# Patient Record
Sex: Female | Born: 1960 | Race: White | Hispanic: No | Smoking: Never smoker
Health system: Southern US, Community
[De-identification: ages and names within clinical notes are randomized; demographics above are authoritative.]

## PROBLEM LIST (undated history)

## (undated) DIAGNOSIS — L568 Other specified acute skin changes due to ultraviolet radiation: Secondary | ICD-10-CM

## (undated) DIAGNOSIS — I10 Essential (primary) hypertension: Secondary | ICD-10-CM

## (undated) DIAGNOSIS — K8689 Other specified diseases of pancreas: Secondary | ICD-10-CM

## (undated) DIAGNOSIS — I81 Portal vein thrombosis: Secondary | ICD-10-CM

## (undated) DIAGNOSIS — F1020 Alcohol dependence, uncomplicated: Secondary | ICD-10-CM

## (undated) DIAGNOSIS — K819 Cholecystitis, unspecified: Secondary | ICD-10-CM

## (undated) DIAGNOSIS — R519 Headache, unspecified: Secondary | ICD-10-CM

## (undated) DIAGNOSIS — K859 Acute pancreatitis without necrosis or infection, unspecified: Secondary | ICD-10-CM

## (undated) DIAGNOSIS — K219 Gastro-esophageal reflux disease without esophagitis: Secondary | ICD-10-CM

## (undated) HISTORY — DX: Headache, unspecified: R51.9

## (undated) HISTORY — PX: TONSILLECTOMY: SUR1361

## (undated) HISTORY — DX: Other specified diseases of pancreas: K86.89

## (undated) HISTORY — DX: Portal vein thrombosis: I81

## (undated) HISTORY — DX: Alcohol dependence, uncomplicated: F10.20

## (undated) HISTORY — PX: TUBAL LIGATION: SHX77

## (undated) HISTORY — DX: Acute pancreatitis without necrosis or infection, unspecified: K85.90

## (undated) HISTORY — PX: DOPPLER ECHOCARDIOGRAPHY: SHX263

## (undated) HISTORY — DX: Gastro-esophageal reflux disease without esophagitis: K21.9

## (undated) HISTORY — PX: OTHER SURGICAL HISTORY: SHX169

## (undated) HISTORY — PX: APPENDECTOMY: SHX54

## (undated) HISTORY — DX: Essential (primary) hypertension: I10

## (undated) HISTORY — DX: Other specified acute skin changes due to ultraviolet radiation: L56.8

---

## 1998-03-27 ENCOUNTER — Ambulatory Visit (HOSPITAL_COMMUNITY): Admission: RE | Admit: 1998-03-27 | Discharge: 1998-03-27 | Payer: Self-pay | Admitting: Family Medicine

## 1998-03-27 ENCOUNTER — Encounter: Payer: Self-pay | Admitting: Family Medicine

## 1999-02-05 HISTORY — PX: OTHER SURGICAL HISTORY: SHX169

## 1999-02-05 HISTORY — PX: DOBUTAMINE STRESS ECHO: SHX5426

## 1999-02-21 ENCOUNTER — Encounter: Payer: Self-pay | Admitting: Pulmonary Disease

## 1999-02-22 ENCOUNTER — Inpatient Hospital Stay (HOSPITAL_COMMUNITY): Admission: EM | Admit: 1999-02-22 | Discharge: 1999-02-24 | Payer: Self-pay | Admitting: Emergency Medicine

## 1999-11-05 HISTORY — PX: OTHER SURGICAL HISTORY: SHX169

## 1999-11-05 HISTORY — PX: ESOPHAGOGASTRODUODENOSCOPY: SHX1529

## 1999-11-14 ENCOUNTER — Encounter (INDEPENDENT_AMBULATORY_CARE_PROVIDER_SITE_OTHER): Payer: Self-pay | Admitting: Specialist

## 1999-11-14 ENCOUNTER — Other Ambulatory Visit: Admission: RE | Admit: 1999-11-14 | Discharge: 1999-11-14 | Payer: Self-pay | Admitting: Gastroenterology

## 1999-11-21 ENCOUNTER — Ambulatory Visit (HOSPITAL_COMMUNITY): Admission: RE | Admit: 1999-11-21 | Discharge: 1999-11-21 | Payer: Self-pay | Admitting: Gastroenterology

## 1999-11-21 ENCOUNTER — Encounter: Payer: Self-pay | Admitting: Gastroenterology

## 1999-12-12 ENCOUNTER — Other Ambulatory Visit: Admission: RE | Admit: 1999-12-12 | Discharge: 1999-12-12 | Payer: Self-pay | Admitting: Family Medicine

## 2000-05-28 ENCOUNTER — Other Ambulatory Visit: Admission: RE | Admit: 2000-05-28 | Discharge: 2000-05-28 | Payer: Self-pay | Admitting: Family Medicine

## 2002-02-17 ENCOUNTER — Other Ambulatory Visit: Admission: RE | Admit: 2002-02-17 | Discharge: 2002-02-17 | Payer: Self-pay | Admitting: Family Medicine

## 2003-03-16 ENCOUNTER — Other Ambulatory Visit: Admission: RE | Admit: 2003-03-16 | Discharge: 2003-03-16 | Payer: Self-pay | Admitting: Family Medicine

## 2003-04-07 HISTORY — PX: COLONOSCOPY: SHX174

## 2003-12-05 HISTORY — PX: OTHER SURGICAL HISTORY: SHX169

## 2004-10-09 ENCOUNTER — Ambulatory Visit: Payer: Self-pay | Admitting: Family Medicine

## 2005-03-27 ENCOUNTER — Other Ambulatory Visit: Admission: RE | Admit: 2005-03-27 | Discharge: 2005-03-27 | Payer: Self-pay | Admitting: Family Medicine

## 2005-03-27 ENCOUNTER — Ambulatory Visit: Payer: Self-pay | Admitting: Family Medicine

## 2005-03-27 ENCOUNTER — Encounter: Payer: Self-pay | Admitting: Family Medicine

## 2005-03-27 LAB — CONVERTED CEMR LAB: Pap Smear: NORMAL

## 2005-04-13 ENCOUNTER — Ambulatory Visit: Payer: Self-pay | Admitting: Family Medicine

## 2005-06-12 ENCOUNTER — Ambulatory Visit: Payer: Self-pay | Admitting: Family Medicine

## 2005-07-31 ENCOUNTER — Ambulatory Visit: Payer: Self-pay | Admitting: Family Medicine

## 2005-08-05 ENCOUNTER — Encounter: Payer: Self-pay | Admitting: Internal Medicine

## 2005-09-25 ENCOUNTER — Ambulatory Visit: Payer: Self-pay | Admitting: Family Medicine

## 2006-06-02 ENCOUNTER — Ambulatory Visit: Payer: Self-pay | Admitting: Family Medicine

## 2006-07-02 ENCOUNTER — Ambulatory Visit: Payer: Self-pay | Admitting: Family Medicine

## 2006-07-02 LAB — CONVERTED CEMR LAB
ALT: 14 U/L (ref 0–35)
AST: 20 U/L (ref 0–37)
Albumin: 3.9 g/dL (ref 3.5–5.2)
Alkaline Phosphatase: 47 U/L (ref 39–117)
BUN: 11 mg/dL (ref 6–23)
Bilirubin, Direct: 0.2 mg/dL (ref 0.0–0.3)
CO2: 28 meq/L (ref 19–32)
Calcium: 9.4 mg/dL (ref 8.4–10.5)
Chloride: 102 meq/L (ref 96–112)
Cholesterol: 142 mg/dL (ref 0–200)
Creatinine, Ser: 0.63 mg/dL (ref 0.40–1.20)
Glucose, Bld: 85 mg/dL (ref 70–99)
HDL: 59 mg/dL (ref >39)
Indirect Bilirubin: 0.4 mg/dL (ref 0.0–0.9)
LDL Cholesterol: 65 mg/dL (ref 0–99)
Potassium: 4 meq/L (ref 3.5–5.3)
Sodium: 141 meq/L (ref 135–145)
TSH: 0.863 u[IU]/mL (ref 0.350–5.50)
Total Bilirubin: 0.6 mg/dL (ref 0.3–1.2)
Total CHOL/HDL Ratio: 2.4
Total Protein: 6.9 g/dL (ref 6.0–8.3)
Triglycerides: 92 mg/dL (ref <150)
VLDL: 18 mg/dL (ref 0–40)

## 2006-07-15 ENCOUNTER — Ambulatory Visit: Payer: Self-pay | Admitting: Family Medicine

## 2006-07-15 LAB — HM MAMMOGRAPHY: HM Mammogram: NORMAL

## 2006-08-16 ENCOUNTER — Encounter: Payer: Self-pay | Admitting: Family Medicine

## 2006-08-16 DIAGNOSIS — I10 Essential (primary) hypertension: Secondary | ICD-10-CM | POA: Insufficient documentation

## 2006-08-16 DIAGNOSIS — K219 Gastro-esophageal reflux disease without esophagitis: Secondary | ICD-10-CM | POA: Insufficient documentation

## 2006-08-16 DIAGNOSIS — J45909 Unspecified asthma, uncomplicated: Secondary | ICD-10-CM | POA: Insufficient documentation

## 2006-08-16 DIAGNOSIS — K209 Esophagitis, unspecified without bleeding: Secondary | ICD-10-CM | POA: Insufficient documentation

## 2006-08-16 DIAGNOSIS — Z9189 Other specified personal risk factors, not elsewhere classified: Secondary | ICD-10-CM | POA: Insufficient documentation

## 2006-08-20 ENCOUNTER — Ambulatory Visit: Payer: Self-pay | Admitting: Family Medicine

## 2006-08-20 ENCOUNTER — Encounter: Payer: Self-pay | Admitting: Family Medicine

## 2006-08-20 ENCOUNTER — Other Ambulatory Visit: Admission: RE | Admit: 2006-08-20 | Discharge: 2006-08-20 | Payer: Self-pay | Admitting: Family Medicine

## 2007-05-30 ENCOUNTER — Telehealth: Payer: Self-pay | Admitting: Family Medicine

## 2007-05-31 ENCOUNTER — Ambulatory Visit: Payer: Self-pay | Admitting: Family Medicine

## 2007-06-03 ENCOUNTER — Telehealth: Payer: Self-pay | Admitting: Family Medicine

## 2008-05-04 ENCOUNTER — Telehealth (INDEPENDENT_AMBULATORY_CARE_PROVIDER_SITE_OTHER): Payer: Self-pay | Admitting: Internal Medicine

## 2009-11-11 ENCOUNTER — Encounter (INDEPENDENT_AMBULATORY_CARE_PROVIDER_SITE_OTHER): Payer: Self-pay | Admitting: *Deleted

## 2010-01-31 ENCOUNTER — Ambulatory Visit: Payer: Self-pay | Admitting: Family Medicine

## 2010-01-31 DIAGNOSIS — M2550 Pain in unspecified joint: Secondary | ICD-10-CM | POA: Insufficient documentation

## 2010-01-31 DIAGNOSIS — E538 Deficiency of other specified B group vitamins: Secondary | ICD-10-CM | POA: Insufficient documentation

## 2010-02-07 ENCOUNTER — Ambulatory Visit: Payer: Self-pay | Admitting: Family Medicine

## 2010-02-11 LAB — CONVERTED CEMR LAB
ALT: 21 units/L (ref 0–35)
AST: 28 units/L (ref 0–37)
Albumin: 3.9 g/dL (ref 3.5–5.2)
Alkaline Phosphatase: 45 units/L (ref 39–117)
BUN: 5 mg/dL — ABNORMAL LOW (ref 6–23)
Bilirubin, Direct: 0.1 mg/dL (ref 0.0–0.3)
CO2: 31 meq/L (ref 19–32)
Calcium: 9.4 mg/dL (ref 8.4–10.5)
Chloride: 98 meq/L (ref 96–112)
Cholesterol: 160 mg/dL (ref 0–200)
Creatinine, Ser: 0.6 mg/dL (ref 0.4–1.2)
GFR calc non Af Amer: 107.68 mL/min (ref >60)
Glucose, Bld: 78 mg/dL (ref 70–99)
HDL: 67.9 mg/dL (ref >39.00)
LDL Cholesterol: 65 mg/dL (ref 0–99)
Potassium: 4.1 meq/L (ref 3.5–5.1)
Sodium: 138 meq/L (ref 135–145)
Total Bilirubin: 0.6 mg/dL (ref 0.3–1.2)
Total CHOL/HDL Ratio: 2
Total Protein: 6.8 g/dL (ref 6.0–8.3)
Triglycerides: 137 mg/dL (ref 0.0–149.0)
VLDL: 27.4 mg/dL (ref 0.0–40.0)
Vitamin B-12: 239 pg/mL (ref 211–911)

## 2010-05-06 NOTE — Assessment & Plan Note (Signed)
Summary: BP IS HIGH/DLO   Vital Signs:  Patient profile:   50 year old female Height:      61 inches Weight:      168.75 pounds BMI:     32.00 Temp:     98.7 degrees F oral Pulse rate:   84 / minute Pulse rhythm:   regular BP sitting:   142 / 98  (left arm) Cuff size:   large  Vitals Entered By: Delilah Shan CMA Duncan Dull) (January 31, 2010 8:54 AM) CC: BP is high.  Patient states she is no longer taking any prescriptioned meds because she ran out of refills but she needs to go back on them.   History of Present Illness: Hypertension:      Using medication without problems or lightheadedness: off meds Chest pain with exertion: no Edema:no Short of breath:no Average home BPs: usually  ~145/90s Other issues: below  ?h/o lupus with prev eval.  H/o arthralgias.  Uses ibuprofen.  GI precautions d/w patient.  H/o B12 def after gastric bypass.  Due for labs.   Allergies: 1)  * Prinivil (Lisinopril) 2)  * Relafen (Nabumetone) 3)  Remeron (Mirtazapine)  Past History:  Family History: Last updated: 01/31/2010 Father: Alive with HTN and hypothyroidism, MI at age 90, h/o renal CA Mother:  Alive with CAD, angioplasty twice with stents, LLE BKA due stent, h/o breast CA Siblings  Two brothers and a sister, the sister has diabetes, renal CA  Social History: Last updated: 01/31/2010 Marital Status: Divorced Children: 2, grown Occupation: Armed forces operational officer, Field seismologist  Past Medical History: GERD Hypertension H/o photosensitivity (no formal dx of lupus), prev eval Dr. 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  Otherwise negative.    Physical Exam  General:  GEN: nad, alert and oriented HEENT: mucous membranes moist NECK: supple w/o LA CV: rrr.  no murmur PULM: ctab, no inc wob ABD: soft, +bs EXT: no edema SKIN: no acute rash    Impression & Recommendations:  Problem # 1:  HYPERTENSION (ICD-401.9) Restart meds and return for labs.  See pan.  Her updated medication list for this problem includes:    Hydrochlorothiazide 12.5 Mg Caps (Hydrochlorothiazide) .Marland Kitchen... Take 1 capsule by mouth once a day  Problem # 2:  B12 DEFICIENCY (ICD-266.2) Return for labs and can get injection after lab draw.  She agrees.   Problem # 3:  ARTHRALGIA (ICD-719.40) Continue NSAIDS but with GI precautions.  She has good relief of pain with this and no h/o adverse effect.  follow up as needed.   Complete Medication List: 1)  Motrin 800 Mg Tabs (Ibuprofen) .... Take 1 tablet by mouth three times a day take with food 2)  Potassium Chloride Crys Cr 20 Meq Tbcr (Potassium chloride crys cr) .... Take 1 tablet  by mouth once a day 3)  Hydrochlorothiazide 12.5 Mg Caps (Hydrochlorothiazide) .... Take 1 capsule by mouth once a day 4)  Cyanocobalamin 1000 Mcg/ml Soln (Cyanocobalamin) .... One injection every 6 months 5)  Promethazine Hcl 25 Mg Tabs (Promethazine hcl) .... One tab by mouth every 6 hrs as needed for nausea. 6)  Benadryl 25 Mg Tabs (Diphenhydramine hcl) .... One at night  Patient Instructions: 1)  Come back for fasting labs. 2)  bmet/lipid 401.1 3)  vitamin B12 266.2 4)  Get your B12 injection after your labs are drawn.   5)  Take care.  Prescriptions: PROMETHAZINE HCL 25 MG TABS (PROMETHAZINE  HCL) one tab by mouth every 6 hrs as needed for nausea.  #50 x 3   Entered and Authorized by:   Crawford Givens MD   Signed by:   Crawford Givens MD on 01/31/2010   Method used:   Electronically to        [facility] [address] (retail)       [address], [address]       [place], [place]  [address]       Ph: [phone]       Fax: [phone]   RxID:   [id number] HYDROCHLOROTHIAZIDE 12.5 MG CAPS (HYDROCHLOROTHIAZIDE) Take 1 capsule by mouth once a day  #90 x 3   Entered and Authorized by:   Crawford Givens MD   Signed by:   Crawford Givens MD on 01/31/2010   Method used:   Electronically to        [facility] [address] (retail)       [address], [address]       [place], [place]  [address]       Ph: [phone]       Fax: [phone]   RxID:   [id number] MOTRIN 800 MG TABS (IBUPROFEN) Take 1 tablet by mouth three times a day take with food  #90 x 5   Entered and Authorized by:   Crawford Givens MD   Signed by:   Crawford Givens MD on 01/31/2010   Method used:   Electronically to        [facility] [address] (retail)       [address], [address]       [place], [place]  [address]       Ph: [phone]       Fax: [phone]   RxID:   [id number] MOTRIN 800 MG TABS (IBUPROFEN) Take 1 tablet by mouth three times a day  #90 x 5   Entered and Authorized by:   Crawford Givens MD   Signed by:   Crawford Givens MD on 01/31/2010   Method used:   Electronically to        [facility] [address] (retail)       [address], [address]       [place], [place]  [address]       Ph: [phone]       Fax: [phone]   RxID:   [id number]    Orders Added: 1)  Est. Patient Level III [40814]    Current Allergies (reviewed today): * PRINIVIL (LISINOPRIL) * RELAFEN (NABUMETONE) REMERON (MIRTAZAPINE)

## 2010-05-06 NOTE — Letter (Signed)
Summary: Nadara Eaton letter  [place] at [facility]  [address] [place], [place] [address]   Phone: [phone]  Fax: [phone]       11/11/2009 MRN: [id number]  [facility] MCB[facility] [address] [place], [place]  [address]  Dear Ms. [facility] [place] [place] announce the retirement of Arta Silence, M.D., from full-time practice at the [facility] office effective October 03, 2009 and his plans of returning part-time.  It is important to Dr. Hetty Ely and to our practice that you understand that [facility] has seven physicians in our office for your health care needs.  We will continue to offer the same exceptional care that you have today.    Dr. Hetty Ely has spoken to many of you about his plans for retirement and returning part-time in the fall.   We will continue to work with you through the transition to schedule appointments for you in the office and meet the high standards that [facility] is committed to.   Again, it is with great pleasure that we share the news that Dr. Hetty Ely will return to [facility] in October of 2011 with a reduced schedule.    If you have any questions, or would like to request an appointment with one of our physicians, please call us at [phone] and press the option for Scheduling an appointment.  We take pleasure in providing you with excellent patient care and look forward to seeing you at your next office visit.  Our [facility] Physicians are:  Tillman Abide, M.D. Laurita Quint, M.D. Roxy Manns, M.D. Kerby Nora, M.D. Hannah Beat, M.D. Ruthe Mannan, M.D. We proudly welcomed Raechel Ache, M.D. and Eustaquio Boyden, M.D. to the practice in July/August 2011.  Sincerely,  [facility]

## 2010-05-06 NOTE — Assessment & Plan Note (Signed)
Summary: B12/DLO  Nurse Visit   Allergies: 1)  * Prinivil (Lisinopril) 2)  * Relafen (Nabumetone) 3)  Remeron (Mirtazapine)  Medication Administration  Injection # 1:    Medication: Vit B12 1000 mcg    Diagnosis: B12 DEFICIENCY (ICD-266.2)    Route: IM    Site: L deltoid    Exp Date: 08/04/2011    Lot #: [id number]    Mfr: American Regent    Patient tolerated injection without complications    Given by: Delilah Shan CMA (AAMA) (February 07, 2010 8:52 AM)  Orders Added: 1)  Admin of Therapeutic Inj  intramuscular or subcutaneous [96372] 2)  Vit B12 1000 mcg [J3420]   Medication Administration  Injection # 1:    Medication: Vit B12 1000 mcg    Diagnosis: B12 DEFICIENCY (ICD-266.2)    Route: IM    Site: L deltoid    Exp Date: 08/04/2011    Lot #: [id number]    Mfr: American Regent    Patient tolerated injection without complications    Given by: Delilah Shan CMA (AAMA) (February 07, 2010 8:52 AM)  Orders Added: 1)  Admin of Therapeutic Inj  intramuscular or subcutaneous [96372] 2)  Vit B12 1000 mcg [J3420]

## 2010-10-22 ENCOUNTER — Other Ambulatory Visit: Payer: Self-pay | Admitting: Family Medicine

## 2011-02-11 ENCOUNTER — Other Ambulatory Visit: Payer: Self-pay | Admitting: Family Medicine

## 2011-02-12 NOTE — Telephone Encounter (Signed)
Left v/m for pt to call back. Dr Para March said after CPX appt made can refill med until CPX appt.

## 2011-02-12 NOTE — Telephone Encounter (Signed)
Received refill request electronically from pharmacy. Is it okay to refill medication? 

## 2011-02-12 NOTE — Telephone Encounter (Signed)
No.  She needs physical with labs first.  Hasn't been seen in a year.

## 2011-02-13 NOTE — Telephone Encounter (Signed)
Give 1 month supply with 1 RF.  Thanks.

## 2011-02-13 NOTE — Telephone Encounter (Signed)
Pt now has appt scheduled for December for physical.  How many refills do you want to give on ibuprofen?

## 2011-02-16 MED ORDER — IBUPROFEN 800 MG PO TABS: 800.0000 mg | ORAL_TABLET | Freq: Three times a day (TID) | ORAL | Status: AC | PRN

## 2011-02-16 NOTE — Telephone Encounter (Signed)
Medication sent to pharmacy  

## 2011-02-17 ENCOUNTER — Other Ambulatory Visit: Payer: Self-pay | Admitting: Family Medicine

## 2011-02-17 NOTE — Telephone Encounter (Signed)
Received faxed refill request from pharmacy. Is it okay to refill medication? 

## 2011-03-04 ENCOUNTER — Other Ambulatory Visit: Payer: Self-pay | Admitting: *Deleted

## 2011-03-04 MED ORDER — HYDROCHLOROTHIAZIDE 12.5 MG PO CAPS: 12.5000 mg | ORAL_CAPSULE | Freq: Every day | ORAL | Status: DC

## 2011-03-20 ENCOUNTER — Encounter: Payer: Self-pay | Admitting: Family Medicine

## 2011-05-14 ENCOUNTER — Encounter: Payer: Self-pay | Admitting: Family Medicine

## 2011-05-15 ENCOUNTER — Encounter: Payer: Self-pay | Admitting: Family Medicine

## 2011-05-15 ENCOUNTER — Ambulatory Visit (INDEPENDENT_AMBULATORY_CARE_PROVIDER_SITE_OTHER): Payer: Self-pay | Admitting: Family Medicine

## 2011-05-15 DIAGNOSIS — M2550 Pain in unspecified joint: Secondary | ICD-10-CM

## 2011-05-15 DIAGNOSIS — Z1231 Encounter for screening mammogram for malignant neoplasm of breast: Secondary | ICD-10-CM

## 2011-05-15 DIAGNOSIS — I10 Essential (primary) hypertension: Secondary | ICD-10-CM

## 2011-05-15 DIAGNOSIS — E538 Deficiency of other specified B group vitamins: Secondary | ICD-10-CM

## 2011-05-15 DIAGNOSIS — R7401 Elevation of levels of liver transaminase levels: Secondary | ICD-10-CM

## 2011-05-15 LAB — COMPREHENSIVE METABOLIC PANEL
AST: 99 U/L — ABNORMAL HIGH (ref 0–37)
Albumin: 4 g/dL (ref 3.5–5.2)
BUN: 7 mg/dL (ref 6–23)
Calcium: 9 mg/dL (ref 8.4–10.5)
Chloride: 100 mEq/L (ref 96–112)
Creatinine, Ser: 0.5 mg/dL (ref 0.4–1.2)
GFR: 133.65 mL/min (ref >60.00)
Glucose, Bld: 71 mg/dL (ref 70–99)

## 2011-05-15 LAB — LIPID PANEL
LDL Cholesterol: 64 mg/dL (ref 0–99)
Triglycerides: 92 mg/dL (ref 0.0–149.0)

## 2011-05-15 LAB — VITAMIN B12: Vitamin B-12: 548 pg/mL (ref 211–911)

## 2011-05-15 MED ORDER — HYDROCHLOROTHIAZIDE 25 MG PO TABS: 25.0000 mg | ORAL_TABLET | Freq: Every day | ORAL | Status: DC

## 2011-05-15 MED ORDER — CELECOXIB 200 MG PO CAPS: 200.0000 mg | ORAL_CAPSULE | Freq: Every day | ORAL | Status: AC

## 2011-05-15 MED ORDER — POTASSIUM CHLORIDE CRYS ER 20 MEQ PO TBCR: 20.0000 meq | EXTENDED_RELEASE_TABLET | Freq: Every day | ORAL | Status: DC

## 2011-05-15 MED ORDER — PROMETHAZINE HCL 25 MG PO TABS: 25.0000 mg | ORAL_TABLET | Freq: Four times a day (QID) | ORAL | Status: DC | PRN

## 2011-05-15 MED ORDER — CYANOCOBALAMIN 1000 MCG/ML IJ SOLN
1000.0000 ug | Freq: Once | INTRAMUSCULAR | Status: AC
  Administered 2011-05-15: 1000 ug via INTRAMUSCULAR

## 2011-05-15 NOTE — Progress Notes (Signed)
H/o gastric bypass.  Subsequent B12 def.  Due for labs.  See notes on labs.   Diffuse joint pain, prev seen by rheum.  Possible lupus but no formal dx.  We talked about the prev gastric bypass and nsaids.  celebrex may be safer, but we talked about potential risk with it, too.  She is aware.  See plan.   Hypertension:    Using medication without problems or lightheadedness: yes Chest pain with exertion:no Edema:no Short of breath:no Average home BPs: 140/90.  Meds, vitals, and allergies reviewed.   PMH and SH reviewed  ROS: See HPI.  Otherwise negative.    GEN: nad, alert and oriented HEENT: mucous membranes moist NECK: supple w/o LA CV: rrr. PULM: ctab, no inc wob ABD: soft, +bs EXT: no edema SKIN: no acute rash

## 2011-05-15 NOTE — Patient Instructions (Addendum)
If needed, call and talk to Aram Beecham about the medication assistance program for celebrex.  See Shirlee Limerick about your referral before you leave today. Let me know if your BP stays above 140/90.

## 2011-05-15 NOTE — Progress Notes (Deleted)
  Subjective:    Patient ID: Abigail [name], female    DOB: [birth year], 51 y.o.   MRN: [id number]  HPI    Review of Systems     Objective:   Physical Exam        Assessment & Plan:

## 2011-05-17 ENCOUNTER — Encounter: Payer: Self-pay | Admitting: Family Medicine

## 2011-05-17 DIAGNOSIS — R7401 Elevation of levels of liver transaminase levels: Secondary | ICD-10-CM | POA: Insufficient documentation

## 2011-05-17 DIAGNOSIS — Z1231 Encounter for screening mammogram for malignant neoplasm of breast: Secondary | ICD-10-CM | POA: Insufficient documentation

## 2011-05-17 NOTE — Assessment & Plan Note (Signed)
Malabsorptive, check labs and inject today.

## 2011-05-17 NOTE — Assessment & Plan Note (Signed)
With possible lupus.  Will change to celebrex from ibuprofen and GI caution given.  She understood.

## 2011-05-17 NOTE — Assessment & Plan Note (Signed)
Refer for u/s.  Will start w/u with that.

## 2011-05-17 NOTE — Assessment & Plan Note (Signed)
Refer. Due.

## 2011-05-17 NOTE — Assessment & Plan Note (Signed)
Inc Hctz and she'll follow.  See notes on labs .

## 2011-05-26 ENCOUNTER — Other Ambulatory Visit: Payer: Self-pay

## 2011-06-02 ENCOUNTER — Encounter: Payer: Self-pay | Admitting: Family Medicine

## 2011-06-26 ENCOUNTER — Ambulatory Visit: Payer: Self-pay | Admitting: Family Medicine

## 2011-06-29 ENCOUNTER — Encounter: Payer: Self-pay | Admitting: Family Medicine

## 2011-06-29 ENCOUNTER — Encounter: Payer: Self-pay | Admitting: *Deleted

## 2012-03-09 ENCOUNTER — Telehealth: Payer: Self-pay | Admitting: Family Medicine

## 2012-03-09 ENCOUNTER — Inpatient Hospital Stay: Payer: Self-pay | Admitting: Internal Medicine

## 2012-03-09 LAB — COMPREHENSIVE METABOLIC PANEL
Albumin: 4.2 g/dL (ref 3.4–5.0)
Alkaline Phosphatase: 103 U/L (ref 50–136)
Bilirubin,Total: 0.9 mg/dL (ref 0.2–1.0)
Co2: 20 mmol/L — ABNORMAL LOW (ref 21–32)
Potassium: 3.3 mmol/L — ABNORMAL LOW (ref 3.5–5.1)
SGPT (ALT): 120 U/L — ABNORMAL HIGH (ref 12–78)
Sodium: 134 mmol/L — ABNORMAL LOW (ref 136–145)
Total Protein: 8.8 g/dL — ABNORMAL HIGH (ref 6.4–8.2)

## 2012-03-09 LAB — CBC
HCT: 47.7 % — ABNORMAL HIGH (ref 35.0–47.0)
HGB: 16.4 g/dL — ABNORMAL HIGH (ref 12.0–16.0)
MCH: 33.5 pg (ref 26.0–34.0)
MCHC: 34.4 g/dL (ref 32.0–36.0)
Platelet: 206 10*3/uL (ref 150–440)
RBC: 4.9 10*6/uL (ref 3.80–5.20)
RDW: 12.8 % (ref 11.5–14.5)

## 2012-03-09 LAB — URINALYSIS, COMPLETE
Glucose,UR: NEGATIVE mg/dL (ref 0–75)
Nitrite: NEGATIVE
Protein: 100
RBC,UR: 6 /HPF (ref 0–5)
Specific Gravity: 1.024 (ref 1.003–1.030)
WBC UR: 9 /HPF (ref 0–5)

## 2012-03-09 LAB — LIPASE, BLOOD: Lipase: 1042 U/L — ABNORMAL HIGH (ref 73–393)

## 2012-03-09 LAB — LACTATE DEHYDROGENASE: LDH: 301 U/L — ABNORMAL HIGH (ref 81–246)

## 2012-03-09 MED ORDER — PROMETHAZINE HCL 25 MG PO TABS: 25.0000 mg | ORAL_TABLET | Freq: Four times a day (QID) | ORAL | Status: DC | PRN

## 2012-03-09 NOTE — Telephone Encounter (Signed)
Patient notified as instructed by telephone. Patient stated that she had a friend bring her some gatorade and will try that before going to the ER. Patient states that she has been taking her promethazine and it is helping and requested that a refill be sent to [facility].

## 2012-03-09 NOTE — Telephone Encounter (Signed)
Noted, sent.  Thanks.   

## 2012-03-09 NOTE — Telephone Encounter (Signed)
Patient notified that script has been sent in.

## 2012-03-09 NOTE — Telephone Encounter (Signed)
Should go to ER if concerned for dehydration.

## 2012-03-09 NOTE — Telephone Encounter (Signed)
° °  Patient Information:  Caller Name: Abigail [name]  Phone: [phone]  Patient: Abigail [name], Abigail [name]  Gender: Female  DOB: [birth year]  Age: 51 Years  PCP: Crawford Givens Clelia Croft) [facility])  Pregnant: No   Symptoms  Reason For Call & Symptoms: Patient calling about vomiting oneset 03/05/12.  Reports episodes "every time I eat something" - 2-3 times per day.  No diarrhea.  History of gastric bypass - remote.  LMP over 2 years ago.  Relates she feels very thirsty/dry mouth and is concerned about dehydration.  Last voided @ 08:55; urine smells strong.  Reviewed Health History In EMR: Yes  Reviewed Medications In EMR: Yes  Reviewed Allergies In EMR: N/A  Reviewed Surgeries / Procedures: Yes  Date of Onset of Symptoms: 03/05/2012  Treatments Tried: Promethazine  Treatments Tried Worked: Yes OB:  LMP: Unknown  Guideline(s) Used:  Vomiting  Disposition Per Guideline:   Go to ED Now (or to Office with PCP Approval)  Reason For Disposition Reached:   Drinking very little and has signs of dehydration (e.g., no urine > 12 hours, very dry mouth, very lightheaded)  Advice Given:  N/A  Office Follow Up:  Does the office need to follow up with this patient?: Yes  Instructions For The Office: Appointments full in Epic.  Patient concerned about dehydration, has darker urine than usual. History of gastric bypass.  Please contact her with instructions related to coming to office or going to ED.  Thank you.

## 2012-03-10 LAB — CBC WITH DIFFERENTIAL/PLATELET
Basophil #: 0.1 10*3/uL (ref 0.0–0.1)
Eosinophil #: 0.1 10*3/uL (ref 0.0–0.7)
Eosinophil %: 2.8 %
Lymphocyte #: 1.1 10*3/uL (ref 1.0–3.6)
Lymphocyte %: 31.3 %
MCHC: 33.7 g/dL (ref 32.0–36.0)
MCV: 99 fL (ref 80–100)
Monocyte %: 18 %
Neutrophil %: 46 %
Platelet: 136 10*3/uL — ABNORMAL LOW (ref 150–440)
RBC: 3.89 10*6/uL (ref 3.80–5.20)
WBC: 3.4 10*3/uL — ABNORMAL LOW (ref 3.6–11.0)

## 2012-03-10 LAB — BASIC METABOLIC PANEL
Anion Gap: 7 (ref 7–16)
BUN: 8 mg/dL (ref 7–18)
Calcium, Total: 8.1 mg/dL — ABNORMAL LOW (ref 8.5–10.1)
Chloride: 108 mmol/L — ABNORMAL HIGH (ref 98–107)
Co2: 25 mmol/L (ref 21–32)
Creatinine: 0.59 mg/dL — ABNORMAL LOW (ref 0.60–1.30)
EGFR (African American): >60
EGFR (Non-African Amer.): >60
Glucose: 75 mg/dL (ref 65–99)
Osmolality: 276 (ref 275–301)

## 2012-03-10 LAB — LIPASE, BLOOD: Lipase: 885 U/L — ABNORMAL HIGH (ref 73–393)

## 2012-03-10 LAB — MAGNESIUM: Magnesium: 1.7 mg/dL — ABNORMAL LOW

## 2012-03-10 LAB — LIPID PANEL
Cholesterol: 156 mg/dL (ref 0–200)
Ldl Cholesterol, Calc: 40 mg/dL (ref 0–100)
Triglycerides: 51 mg/dL (ref 0–200)
VLDL Cholesterol, Calc: 10 mg/dL (ref 5–40)

## 2012-03-11 LAB — LIPASE, BLOOD: Lipase: 560 U/L — ABNORMAL HIGH (ref 73–393)

## 2012-03-11 LAB — BASIC METABOLIC PANEL
Calcium, Total: 8.1 mg/dL — ABNORMAL LOW (ref 8.5–10.1)
Chloride: 110 mmol/L — ABNORMAL HIGH (ref 98–107)
Co2: 24 mmol/L (ref 21–32)
Creatinine: 0.54 mg/dL — ABNORMAL LOW (ref 0.60–1.30)
EGFR (African American): >60
Potassium: 3.2 mmol/L — ABNORMAL LOW (ref 3.5–5.1)
Sodium: 142 mmol/L (ref 136–145)

## 2012-03-12 LAB — HEPATIC FUNCTION PANEL A (ARMC)
Albumin: 2.8 g/dL — ABNORMAL LOW
Alkaline Phosphatase: 76 U/L
Bilirubin, Direct: 0.2 mg/dL
Bilirubin,Total: 0.5 mg/dL
SGOT(AST): 56 U/L — ABNORMAL HIGH
SGPT (ALT): 50 U/L
Total Protein: 6.1 g/dL — ABNORMAL LOW

## 2012-03-12 LAB — PLATELET COUNT: Platelet: 121 10*3/uL — ABNORMAL LOW (ref 150–440)

## 2012-03-12 LAB — POTASSIUM: Potassium: 3.5 mmol/L

## 2012-03-12 LAB — LIPASE, BLOOD: Lipase: 714 U/L — ABNORMAL HIGH (ref 73–393)

## 2012-03-13 ENCOUNTER — Encounter: Payer: Self-pay | Admitting: Family Medicine

## 2012-03-13 DIAGNOSIS — K859 Acute pancreatitis without necrosis or infection, unspecified: Secondary | ICD-10-CM | POA: Insufficient documentation

## 2012-03-13 LAB — CBC WITH DIFFERENTIAL/PLATELET
Basophil #: 0 10*3/uL (ref 0.0–0.1)
Eosinophil #: 0.1 10*3/uL (ref 0.0–0.7)
Eosinophil %: 3.6 %
HCT: 41.7 % (ref 35.0–47.0)
Lymphocyte #: 0.9 10*3/uL — ABNORMAL LOW (ref 1.0–3.6)
MCHC: 34.7 g/dL (ref 32.0–36.0)
MCV: 98 fL (ref 80–100)
Neutrophil #: 1.6 10*3/uL (ref 1.4–6.5)
Platelet: 124 10*3/uL — ABNORMAL LOW (ref 150–440)
RDW: 12.7 % (ref 11.5–14.5)
WBC: 3.4 10*3/uL — ABNORMAL LOW (ref 3.6–11.0)

## 2012-03-13 LAB — LIPASE, BLOOD: Lipase: 783 U/L — ABNORMAL HIGH (ref 73–393)

## 2012-03-14 LAB — HEPATIC FUNCTION PANEL A (ARMC)
Alkaline Phosphatase: 72 U/L (ref 50–136)
Bilirubin, Direct: 0.2 mg/dL (ref 0.00–0.20)
Bilirubin,Total: 0.6 mg/dL (ref 0.2–1.0)
SGOT(AST): 75 U/L — ABNORMAL HIGH (ref 15–37)
SGPT (ALT): 53 U/L (ref 12–78)
Total Protein: 6.6 g/dL (ref 6.4–8.2)

## 2012-03-14 LAB — LIPASE, BLOOD: Lipase: 715 U/L — ABNORMAL HIGH (ref 73–393)

## 2012-03-14 LAB — AMYLASE: Amylase: 66 U/L (ref 25–115)

## 2012-03-17 ENCOUNTER — Encounter: Payer: Self-pay | Admitting: Family Medicine

## 2012-03-17 DIAGNOSIS — K2970 Gastritis, unspecified, without bleeding: Secondary | ICD-10-CM | POA: Insufficient documentation

## 2012-04-08 ENCOUNTER — Ambulatory Visit (INDEPENDENT_AMBULATORY_CARE_PROVIDER_SITE_OTHER): Payer: Self-pay | Admitting: Family Medicine

## 2012-04-08 ENCOUNTER — Encounter: Payer: Self-pay | Admitting: Family Medicine

## 2012-04-08 VITALS — BP 132/86 | HR 93 | Temp 98.8°F | Wt 172.0 lb

## 2012-04-08 DIAGNOSIS — K2970 Gastritis, unspecified, without bleeding: Secondary | ICD-10-CM

## 2012-04-08 DIAGNOSIS — F419 Anxiety disorder, unspecified: Secondary | ICD-10-CM

## 2012-04-08 DIAGNOSIS — K2990 Gastroduodenitis, unspecified, without bleeding: Secondary | ICD-10-CM

## 2012-04-08 DIAGNOSIS — R918 Other nonspecific abnormal finding of lung field: Secondary | ICD-10-CM

## 2012-04-08 DIAGNOSIS — K859 Acute pancreatitis without necrosis or infection, unspecified: Secondary | ICD-10-CM

## 2012-04-08 DIAGNOSIS — F411 Generalized anxiety disorder: Secondary | ICD-10-CM

## 2012-04-08 MED ORDER — SUCRALFATE 1 G PO TABS: 1.0000 g | ORAL_TABLET | Freq: Three times a day (TID) | ORAL | Status: DC

## 2012-04-08 MED ORDER — TRAZODONE HCL 50 MG PO TABS: 25.0000 mg | ORAL_TABLET | Freq: Every evening | ORAL | Status: DC | PRN

## 2012-04-08 MED ORDER — PROMETHAZINE HCL 25 MG PO TABS: 25.0000 mg | ORAL_TABLET | Freq: Four times a day (QID) | ORAL | Status: DC | PRN

## 2012-04-08 MED ORDER — PANTOPRAZOLE SODIUM 40 MG PO TBEC: 40.0000 mg | DELAYED_RELEASE_TABLET | Freq: Two times a day (BID) | ORAL | Status: DC

## 2012-04-08 MED ORDER — METOPROLOL TARTRATE 50 MG PO TABS: 50.0000 mg | ORAL_TABLET | Freq: Two times a day (BID) | ORAL | Status: DC

## 2012-04-08 NOTE — Patient Instructions (Addendum)
Keep taking the carafate until the pain is totally resolved, then try to taper off that medicine.   Keep taking the protonix twice a day.  When you've been off the carafate for 1 month, then try to cut the protonix back to once a day.  Take trazodone at night for sleep, 1/2 to 1 pill.  Call back with an update in about 2 weeks, sooner if needed.  I would avoid all alcohol.

## 2012-04-10 ENCOUNTER — Encounter: Payer: Self-pay | Admitting: Family Medicine

## 2012-04-10 ENCOUNTER — Telehealth: Payer: Self-pay | Admitting: Family Medicine

## 2012-04-10 DIAGNOSIS — R918 Other nonspecific abnormal finding of lung field: Secondary | ICD-10-CM | POA: Insufficient documentation

## 2012-04-10 DIAGNOSIS — F419 Anxiety disorder, unspecified: Secondary | ICD-10-CM | POA: Insufficient documentation

## 2012-04-10 NOTE — Telephone Encounter (Signed)
Call [facility].  She had mention of pulmonary nodules on d/c summary.  I need imaging report that states the location, number, size of nodules.  Thanks.

## 2012-04-10 NOTE — Assessment & Plan Note (Signed)
With insomnia.  Cut out etoh, start trazodone for sleep.  No SI/HI.  She agrees.

## 2012-04-10 NOTE — Progress Notes (Signed)
Admitted with pancreatitis 12/13.  Likely gallstone and or etoh related.  D/w pt about etoh use.  She was drinking at night related to anxiety with sisters death in 2010-08-17.  No Si/Hi.  Now with much less abd pain.  No vomiting.  Taking PO solids well.  No fevers.   Also with gastritis noted on EGD during admission.  Likely nsaid related.  Now on PPI and carafate and improved.  No bloody vomit.  Abdominal pain improving.    Pulmonary nodules noted on CT per discharge summary.  Requesting records.  No h/o smoking but h/o second hand exposure.  No FH lung CA primary- sister had non-lung CA with subsequent mets to lungs.    Available records from [facility] reviewed.    PMH and SH reviewed  ROS: See HPI, otherwise noncontributory.  Meds, vitals, and allergies reviewed.   Nad, tearful talking about her sister, regains composure.   Mmm rrr ctab abd soft, minimal epigastric tenderness, no rebound Ext w/o edema Normal radial pulses.

## 2012-04-10 NOTE — Assessment & Plan Note (Signed)
Can taper sucralfate when abd pain fully resolved.  Improving.

## 2012-04-10 NOTE — Assessment & Plan Note (Signed)
Requesting records.  D/w pt.  She understood that I needed more details from the scans.

## 2012-04-10 NOTE — Assessment & Plan Note (Signed)
Advised pt that safest level of etoh is zero.  She could have return of sx.  She understands. Improving, continue with meds per instructions below.

## 2012-04-11 NOTE — Telephone Encounter (Signed)
Reports requested by fax.

## 2012-04-11 NOTE — Telephone Encounter (Signed)
Received report, in your in box.

## 2012-04-12 NOTE — Telephone Encounter (Signed)
Left detailed message on VM.

## 2012-04-12 NOTE — Telephone Encounter (Signed)
Notify pt.  Would need f/u CT chest in 3 months. Orders are in. Thanks. Will also route to [facility].

## 2012-04-12 NOTE — Assessment & Plan Note (Signed)
04/2012: 7 mm nodule noted, repeat CT scan for 3 months ordered.

## 2012-04-19 ENCOUNTER — Encounter (HOSPITAL_COMMUNITY): Payer: Self-pay | Admitting: *Deleted

## 2012-04-19 ENCOUNTER — Telehealth: Payer: Self-pay | Admitting: Family Medicine

## 2012-04-19 ENCOUNTER — Observation Stay (HOSPITAL_COMMUNITY)
Admission: EM | Admit: 2012-04-19 | Discharge: 2012-04-21 | Disposition: A | Discharge status: Home / Self Care | Payer: Self-pay | Attending: General Surgery | Admitting: General Surgery

## 2012-04-19 DIAGNOSIS — E039 Hypothyroidism, unspecified: Secondary | ICD-10-CM | POA: Insufficient documentation

## 2012-04-19 DIAGNOSIS — Z85528 Personal history of other malignant neoplasm of kidney: Secondary | ICD-10-CM | POA: Insufficient documentation

## 2012-04-19 DIAGNOSIS — M329 Systemic lupus erythematosus, unspecified: Secondary | ICD-10-CM | POA: Insufficient documentation

## 2012-04-19 DIAGNOSIS — I2510 Atherosclerotic heart disease of native coronary artery without angina pectoris: Secondary | ICD-10-CM | POA: Insufficient documentation

## 2012-04-19 DIAGNOSIS — K8010 Calculus of gallbladder with chronic cholecystitis without obstruction: Principal | ICD-10-CM | POA: Insufficient documentation

## 2012-04-19 DIAGNOSIS — Z9861 Coronary angioplasty status: Secondary | ICD-10-CM | POA: Insufficient documentation

## 2012-04-19 DIAGNOSIS — R7401 Elevation of levels of liver transaminase levels: Secondary | ICD-10-CM | POA: Insufficient documentation

## 2012-04-19 DIAGNOSIS — R7402 Elevation of levels of lactic acid dehydrogenase (LDH): Secondary | ICD-10-CM | POA: Insufficient documentation

## 2012-04-19 DIAGNOSIS — Z9884 Bariatric surgery status: Secondary | ICD-10-CM | POA: Insufficient documentation

## 2012-04-19 DIAGNOSIS — Z853 Personal history of malignant neoplasm of breast: Secondary | ICD-10-CM | POA: Insufficient documentation

## 2012-04-19 DIAGNOSIS — Z79899 Other long term (current) drug therapy: Secondary | ICD-10-CM | POA: Insufficient documentation

## 2012-04-19 DIAGNOSIS — K859 Acute pancreatitis without necrosis or infection, unspecified: Secondary | ICD-10-CM

## 2012-04-19 DIAGNOSIS — I10 Essential (primary) hypertension: Secondary | ICD-10-CM | POA: Insufficient documentation

## 2012-04-19 DIAGNOSIS — K8050 Calculus of bile duct without cholangitis or cholecystitis without obstruction: Secondary | ICD-10-CM | POA: Diagnosis present

## 2012-04-19 DIAGNOSIS — J45909 Unspecified asthma, uncomplicated: Secondary | ICD-10-CM | POA: Insufficient documentation

## 2012-04-19 DIAGNOSIS — I252 Old myocardial infarction: Secondary | ICD-10-CM | POA: Insufficient documentation

## 2012-04-19 HISTORY — DX: Cholecystitis, unspecified: K81.9

## 2012-04-19 LAB — CBC WITH DIFFERENTIAL/PLATELET
Eosinophils Relative: 2 % (ref 0–5)
Hemoglobin: 15 g/dL (ref 12.0–15.0)
Lymphocytes Relative: 25 % (ref 12–46)
Lymphs Abs: 1.1 10*3/uL (ref 0.7–4.0)
MCV: 98 fL (ref 78.0–100.0)
Monocytes Relative: 14 % — ABNORMAL HIGH (ref 3–12)
Platelets: 211 10*3/uL (ref 150–400)
RBC: 4.5 MIL/uL (ref 3.87–5.11)
WBC: 4.2 10*3/uL (ref 4.0–10.5)

## 2012-04-19 LAB — URINALYSIS, ROUTINE W REFLEX MICROSCOPIC
Hgb urine dipstick: NEGATIVE
Ketones, ur: 15 mg/dL — AB
Protein, ur: NEGATIVE mg/dL
Urobilinogen, UA: 2 mg/dL — ABNORMAL HIGH (ref 0.0–1.0)

## 2012-04-19 LAB — URINE MICROSCOPIC-ADD ON

## 2012-04-19 LAB — COMPREHENSIVE METABOLIC PANEL
ALT: 46 U/L — ABNORMAL HIGH (ref 0–35)
Alkaline Phosphatase: 55 U/L (ref 39–117)
BUN: 9 mg/dL (ref 6–23)
CO2: 27 mEq/L (ref 19–32)
GFR calc Af Amer: >90 mL/min (ref >90)
GFR calc non Af Amer: >90 mL/min (ref >90)
Glucose, Bld: 103 mg/dL — ABNORMAL HIGH (ref 70–99)
Potassium: 3.6 mEq/L (ref 3.5–5.1)
Sodium: 141 mEq/L (ref 135–145)

## 2012-04-19 LAB — SURGICAL PCR SCREEN: MRSA, PCR: NEGATIVE

## 2012-04-19 MED ORDER — ONDANSETRON HCL 4 MG/2ML IJ SOLN
4.0000 mg | Freq: Four times a day (QID) | INTRAMUSCULAR | Status: DC | PRN
  Administered 2012-04-19 – 2012-04-21 (×2): 4 mg via INTRAVENOUS
  Filled 2012-04-19 (×2): qty 2

## 2012-04-19 MED ORDER — OXYCODONE HCL 5 MG PO TABS
5.0000 mg | ORAL_TABLET | ORAL | Status: DC | PRN
  Administered 2012-04-19: 5 mg via ORAL
  Administered 2012-04-20: 10 mg via ORAL
  Filled 2012-04-19: qty 2
  Filled 2012-04-19: qty 1

## 2012-04-19 MED ORDER — CIPROFLOXACIN IN D5W 400 MG/200ML IV SOLN
400.0000 mg | Freq: Once | INTRAVENOUS | Status: AC
  Administered 2012-04-20: 400 mg via INTRAVENOUS
  Filled 2012-04-19: qty 200

## 2012-04-19 MED ORDER — SUCRALFATE 1 G PO TABS
1.0000 g | ORAL_TABLET | Freq: Three times a day (TID) | ORAL | Status: DC
  Administered 2012-04-21: 1 g via ORAL
  Filled 2012-04-19 (×8): qty 1

## 2012-04-19 MED ORDER — PANTOPRAZOLE SODIUM 40 MG PO TBEC
40.0000 mg | DELAYED_RELEASE_TABLET | Freq: Two times a day (BID) | ORAL | Status: DC
  Administered 2012-04-19 – 2012-04-21 (×4): 40 mg via ORAL
  Filled 2012-04-19 (×4): qty 1

## 2012-04-19 MED ORDER — METOPROLOL TARTRATE 50 MG PO TABS
50.0000 mg | ORAL_TABLET | Freq: Two times a day (BID) | ORAL | Status: DC
  Administered 2012-04-19 – 2012-04-21 (×4): 50 mg via ORAL
  Filled 2012-04-19 (×7): qty 1

## 2012-04-19 MED ORDER — PROMETHAZINE HCL 25 MG PO TABS: 25.0000 mg | ORAL_TABLET | Freq: Four times a day (QID) | ORAL | Status: DC | PRN

## 2012-04-19 MED ORDER — KCL IN DEXTROSE-NACL 20-5-0.45 MEQ/L-%-% IV SOLN
INTRAVENOUS | Status: DC
  Administered 2012-04-20: 08:00:00 via INTRAVENOUS
  Filled 2012-04-19 (×7): qty 1000

## 2012-04-19 MED ORDER — OXYCODONE-ACETAMINOPHEN 5-325 MG PO TABS
1.0000 | ORAL_TABLET | Freq: Once | ORAL | Status: AC
  Administered 2012-04-19: 1 via ORAL
  Filled 2012-04-19: qty 1

## 2012-04-19 MED ORDER — MORPHINE SULFATE 2 MG/ML IJ SOLN
1.0000 mg | INTRAMUSCULAR | Status: DC | PRN
  Administered 2012-04-20 – 2012-04-21 (×4): 2 mg via INTRAVENOUS
  Filled 2012-04-19 (×4): qty 1

## 2012-04-19 MED ORDER — TRAZODONE HCL 50 MG PO TABS
50.0000 mg | ORAL_TABLET | Freq: Every day | ORAL | Status: DC
  Administered 2012-04-19 – 2012-04-20 (×2): 50 mg via ORAL
  Filled 2012-04-19 (×4): qty 1

## 2012-04-19 MED ORDER — DIPHENHYDRAMINE HCL 25 MG PO TABS
25.0000 mg | ORAL_TABLET | Freq: Every day | ORAL | Status: DC
  Administered 2012-04-19 – 2012-04-20 (×2): 25 mg via ORAL
  Filled 2012-04-19 (×4): qty 1

## 2012-04-19 NOTE — ED Notes (Signed)
Pt with hx of gastric bypass, cholecystitis and pancreatitis to ED c/o abd pain and emesis after eating pizza and drinking beer on Fri.  Pain has become increasingly worse, thus pt is here.

## 2012-04-19 NOTE — Telephone Encounter (Signed)
Agreed, ER eval reasonable.

## 2012-04-19 NOTE — Progress Notes (Signed)
Patient admitted to [facility] Room 25 from E.D. Dx: abdominal pain. Anticipate cholecystectomy tomorrow. Denies pain at present. Oriented to room. Call bell in reach.

## 2012-04-19 NOTE — ED Notes (Signed)
Pt c/o mid upper abd pain with radiation to RUQ. Pt reports she was recently dx with a gallstone and they decided not to treat it. Pt reports she has been dealing with this pain for over month but last night it started to worsen. Pt reports pain is worse after eating. Pt in nad, skin warm and dry.

## 2012-04-19 NOTE — ED Provider Notes (Signed)
History     CSN: [id number]  Arrival date & time 04/19/12  1130   First MD Initiated Contact with Patient 04/19/12 1152      Chief Complaint  Patient presents with  . Abdominal Pain     HPI Patient with history of gastric bypass comes in with epigastric pain precipitated by meals followed by episodes of vomiting.  Admitted within the last 30 days for sign complaint and diagnosed with possible sludge in her gallbladder.  Collected at that time not to have a cholecystectomy.  Did have elevated lipase consistent with pancreatitis. Past Medical History  Diagnosis Date  . GERD (gastroesophageal reflux disease)   . Hypertension   . Photosensitivity     (No formal dx of lupus) prev eval Dr. Gavin Potters in [place]  . Pancreatitis     2013 alcohol and/or gallstone  . Cholecystitis     Past Surgical History  Procedure Date  . Appendectomy     As a child  . Tonsillectomy     As a child  . Nsvd     X 2  . Tubal ligation Late 20's  . Foot surgery     Pin in small toe  . Doppler echocardiography     Secondary to Redux, wnl  . Dobutamine stress echo 11/00    wnl  . Cat scan 11/00    wnl  . Esophagogastroduodenoscopy 8/01    Esophagitis [name])  . Sbft 08/01    wnl  . 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 Heart disease Mother     CAD, angioplasty twice with stents  . Cancer Mother     Breast  . Hypertension Father   . Hypothyroidism Father   . Heart disease Father     MI at age 47  . Cancer Father     H/O renal CA  . Diabetes Sister   . Cancer Sister     Renal CA    History  Substance Use Topics  . Smoking status: Never Smoker   . Smokeless tobacco: Never Used  . Alcohol Use: Yes     Comment: occasionallly    OB History    Grav Para Term Preterm Abortions TAB SAB Ect Mult Living                  Review of Systems All other systems reviewed and are negative Allergies  Lisinopril;  Mirtazapine; Nsaids; and Prednisone  Home Medications   Current Outpatient Rx  Name  Route  Sig  Dispense  Refill  . CALCIUM CARBONATE-VITAMIN D 500-200 MG-UNIT PO TABS   Oral   Take 1 tablet by mouth daily.         Marland Kitchen DIPHENHYDRAMINE HCL 25 MG PO TABS   Oral   Take 25 mg by mouth at bedtime.         Marland Kitchen METOPROLOL TARTRATE 50 MG PO TABS   Oral   Take 1 tablet (50 mg total) by mouth 2 (two) times daily.   60 tablet   5   . ONE-DAILY MULTI VITAMINS PO TABS   Oral   Take 1 tablet by mouth daily.         Marland Kitchen PANTOPRAZOLE SODIUM 40 MG PO TBEC   Oral   Take 1 tablet (40 mg total) by mouth 2 (two) times daily.   60 tablet   2   . PROMETHAZINE HCL 25 MG PO TABS  Oral   Take 1 tablet (25 mg total) by mouth every 6 (six) hours as needed for nausea.   50 tablet   2   . SUCRALFATE 1 G PO TABS   Oral   Take 1 tablet (1 g total) by mouth 3 (three) times daily before meals.   90 tablet   1   . TRAZODONE HCL 50 MG PO TABS   Oral   Take 50 mg by mouth at bedtime.         . CYANOCOBALAMIN 1000 MCG/ML IJ SOLN   Intramuscular   Inject 1,000 mcg into the muscle every 6 (six) months.           BP 151/90  Pulse 68  Temp 98.1 F (36.7 C) (Oral)  Resp 22  SpO2 99%  Physical Exam  Nursing note and vitals reviewed. Constitutional: She is oriented to person, place, and time. She appears well-developed and well-nourished. No distress.  HENT:  Head: Normocephalic and atraumatic.  Eyes: Pupils are equal, round, and reactive to light.  Neck: Normal range of motion.  Cardiovascular: Normal rate and intact distal pulses.   Pulmonary/Chest: No respiratory distress.  Abdominal: Normal appearance. She exhibits no distension. There is tenderness in the right upper quadrant and epigastric area. There is guarding. There is no rigidity and no rebound.    Musculoskeletal: Normal range of motion.  Neurological: She is alert and oriented to person, place, and time. No cranial nerve  deficit.  Skin: Skin is warm and dry. No rash noted.  Psychiatric: She has a normal mood and affect. Her behavior is normal.    ED Course  Procedures (including critical care time)  Labs Reviewed  CBC WITH DIFFERENTIAL - Abnormal; Notable for the following:    Monocytes Relative 14 (*)     All other components within normal limits  COMPREHENSIVE METABOLIC PANEL - Abnormal; Notable for the following:    Glucose, Bld 103 (*)     AST 63 (*)     ALT 46 (*)     Total Bilirubin 1.4 (*)     All other components within normal limits  LIPASE, BLOOD - Abnormal; Notable for the following:    Lipase 79 (*)     All other components within normal limits  URINALYSIS, ROUTINE W REFLEX MICROSCOPIC - Abnormal; Notable for the following:    APPearance CLOUDY (*)     Specific Gravity, Urine 1.037 (*)     Bilirubin Urine MODERATE (*)     Ketones, ur 15 (*)     Urobilinogen, UA 2.0 (*)     Nitrite POSITIVE (*)     Leukocytes, UA SMALL (*)     All other components within normal limits  URINE MICROSCOPIC-ADD ON - Abnormal; Notable for the following:    Squamous Epithelial / LPF MANY (*)     Bacteria, UA MANY (*)     All other components within normal limits  URINE CULTURE   No results found.   1. Pancreatitis, acute   2. Transaminitis       MDM   General surgery contacted.       Nelia Shi, MD 04/19/12 1536

## 2012-04-19 NOTE — H&P (Signed)
Abigail [name] 1961-02-21  [id number].   Primary Care MD: Dr. Crawford Givens  Chief Complaint/Reason for Consult: gallstones HPI: This is a 52 yo female with a h/o lap gastric roux-en-Y bypass who about a month ago started having post prandial abdominal pain.  She had nausea and vomiting and was admitted to [facility] for dehydration.  She was also found to have an elevated lipase and gallstones and sludge.  Surgery thought she needed her gallbladder out, but GI apparently did an endoscopy and ended up not having a cholecystectomy.  She felt better, but this past Friday she ate some pizza and started having abdominal pain in the epigastrium and RUQ.  She then developed nausea and vomiting.  She called her PCP who referred her here.  We have been asked to see her for admission.  Review of Systems: Please see HPI, otherwise all other systems have been reviewed and are negative.  Family History  Problem Relation Age of Onset  . Heart disease Mother     CAD, angioplasty twice with stents  . Cancer Mother     Breast  . Hypertension Father   . Hypothyroidism Father   . Heart disease Father     MI at age 75  . Cancer Father     H/O renal CA  . Diabetes Sister   . Cancer Sister     Renal CA    Past Medical History  Diagnosis Date  . GERD (gastroesophageal reflux disease)   . Hypertension   . Photosensitivity     (No formal dx of lupus) prev eval Dr. Gavin Potters in [place]  . Pancreatitis     2013 alcohol and/or gallstone  . Cholecystitis     Past Surgical History  Procedure Date  . Appendectomy     As a child  . Tonsillectomy     As a child  . Nsvd     X 2  . Tubal ligation Late 20's  . Foot surgery     Pin in small toe  . Doppler echocardiography     Secondary to Redux, wnl  . Dobutamine stress echo 11/00    wnl  . Cat scan 11/00    wnl  . Esophagogastroduodenoscopy 8/01    Esophagitis [name])  . Sbft 08/01    wnl  . Laparoscopic roux-en-y, gastric bypass 12/05/03     Social History:  reports that she has never smoked. She has never used smokeless tobacco. She reports that she drinks alcohol. She reports that she does not use illicit drugs.  Allergies:  Allergies  Allergen Reactions  . Lisinopril     Hives, presumed allergy  . Mirtazapine     swelling  . Nsaids     Gastritis 2013  . Prednisone     Mood changes.      (Not in a hospital admission)  Blood pressure 151/90, pulse 68, temperature 98.1 F (36.7 C), temperature source Oral, resp. rate 22, SpO2 99.00%. Physical Exam: General: pleasant, WD, WN white female who is laying in bed in NAD HEENT: head is normocephalic, atraumatic.  Sclera are noninjected.  PERRL.  Ears and nose without any masses or lesions.  Mouth is pink and moist Heart: regular, rate, and rhythm.  Normal s1,s2. No obvious murmurs, gallops, or rubs noted.  Palpable radial and pedal pulses bilaterally Lungs: CTAB, no wheezes, rhonchi, or rales noted.  Respiratory effort nonlabored Abd: soft, mildly tender in RUQ and epigastrium, ND, +BS, no masses, hernias, or organomegaly MS:  all 4 extremities are symmetrical with no cyanosis, clubbing, or edema. Skin: warm and dry with no masses, lesions, or rashes Psych: A&Ox3 with an appropriate affect.    Results for orders placed during the hospital encounter of 04/19/12 (from the past 48 hour(s))  CBC WITH DIFFERENTIAL     Status: Abnormal   Collection Time   04/19/12 11:47 AM      Component Value Range Comment   WBC 4.2  4.0 - 10.5 K/uL    RBC 4.50  3.87 - 5.11 MIL/uL    Hemoglobin 15.0  12.0 - 15.0 g/dL    HCT 95.6  21.3 - 08.6 %    MCV 98.0  78.0 - 100.0 fL    MCH 33.3  26.0 - 34.0 pg    MCHC 34.0  30.0 - 36.0 g/dL    RDW 57.8  46.9 - 62.9 %    Platelets 211  150 - 400 K/uL    Neutrophils Relative 58  43 - 77 %    Neutro Abs 2.4  1.7 - 7.7 K/uL    Lymphocytes Relative 25  12 - 46 %    Lymphs Abs 1.1  0.7 - 4.0 K/uL    Monocytes Relative 14 (*) 3 - 12 %     Monocytes Absolute 0.6  0.1 - 1.0 K/uL    Eosinophils Relative 2  0 - 5 %    Eosinophils Absolute 0.1  0.0 - 0.7 K/uL    Basophils Relative 1  0 - 1 %    Basophils Absolute 0.0  0.0 - 0.1 K/uL   COMPREHENSIVE METABOLIC PANEL     Status: Abnormal   Collection Time   04/19/12 11:47 AM      Component Value Range Comment   Sodium 141  135 - 145 mEq/L    Potassium 3.6  3.5 - 5.1 mEq/L    Chloride 98  96 - 112 mEq/L    CO2 27  19 - 32 mEq/L    Glucose, Bld 103 (*) 70 - 99 mg/dL    BUN 9  6 - 23 mg/dL    Creatinine, Ser 5.28  0.50 - 1.10 mg/dL    Calcium 41.3  8.4 - 10.5 mg/dL    Total Protein 8.1  6.0 - 8.3 g/dL    Albumin 4.1  3.5 - 5.2 g/dL    AST 63 (*) 0 - 37 U/L    ALT 46 (*) 0 - 35 U/L    Alkaline Phosphatase 55  39 - 117 U/L    Total Bilirubin 1.4 (*) 0.3 - 1.2 mg/dL    GFR calc non Af Amer >90  >90 mL/min    GFR calc Af Amer >90  >90 mL/min   LIPASE, BLOOD     Status: Abnormal   Collection Time   04/19/12 11:47 AM      Component Value Range Comment   Lipase 79 (*) 11 - 59 U/L   URINALYSIS, ROUTINE W REFLEX MICROSCOPIC     Status: Abnormal   Collection Time   04/19/12 12:05 PM      Component Value Range Comment   Color, Urine YELLOW  YELLOW    APPearance CLOUDY (*) CLEAR    Specific Gravity, Urine 1.037 (*) 1.005 - 1.030    pH 6.0  5.0 - 8.0    Glucose, UA NEGATIVE  NEGATIVE mg/dL    Hgb urine dipstick NEGATIVE  NEGATIVE    Bilirubin Urine MODERATE (*) NEGATIVE  Ketones, ur 15 (*) NEGATIVE mg/dL    Protein, ur NEGATIVE  NEGATIVE mg/dL    Urobilinogen, UA 2.0 (*) 0.0 - 1.0 mg/dL    Nitrite POSITIVE (*) NEGATIVE    Leukocytes, UA SMALL (*) NEGATIVE   URINE MICROSCOPIC-ADD ON     Status: Abnormal   Collection Time   04/19/12 12:05 PM      Component Value Range Comment   Squamous Epithelial / LPF MANY (*) RARE    WBC, UA 7-10  <3 WBC/hpf    RBC / HPF 0-2  <3 RBC/hpf    Bacteria, UA MANY (*) RARE    Urine-Other MUCOUS PRESENT      No results found.      Assessment/Plan 1. Biliary colic 2. SLE 3. HTN  Plan: 1. Will obtain ultrasound report from [facility].  She will be admitted for a cholecystectomy tomorrow.  She will be given prn meds for pain and nausea.  Clear liquids tonight, NPO p MN.  Aldan Camey E 04/19/2012, 3:44 PM Pager: [phone]

## 2012-04-19 NOTE — ED Notes (Signed)
Pharmacy tech at bedside 

## 2012-04-19 NOTE — Telephone Encounter (Signed)
Patient Information:  Caller Name: [place]  Phone: [phone]  Patient: Abigail [name], Abigail [name]  Gender: Female  DOB: 09/19/1960  Age: 52 Years  PCP: Crawford Givens Clelia Croft) [facility])  Pregnant: No  Office Follow Up:  Does the office need to follow up with this patient?: No  Instructions For The Office: N/A  RN Note:  Post menopausal. Reports abdomen is "balled up. Mild distension.  Vomits after eating.  Pain rated 8 out of 10.  Most recent emesis were after eating pizza or chicken pot pie.  Last emesis 04/18/12 evening. History of pancreatitis 03/08/12;  Gallstones noted at hospital follow up visit 04/08/12.  Sent to ED.  Symptoms  Reason For Call & Symptoms: Called re intermittent mid abdominal pain that radiates to right.  Reviewed Health History In EMR: Yes  Reviewed Medications In EMR: Yes  Reviewed Allergies In EMR: Yes  Reviewed Surgeries / Procedures: Yes  Date of Onset of Symptoms: 04/15/2012  Treatments Tried: Phenergan prn  Treatments Tried Worked: No OB / GYN:  LMP: Unknown  Guideline(s) Used:  Abdominal Pain - Upper  Disposition Per Guideline:   Go to ED Now  Reason For Disposition Reached:   Pain lasting > 10 minutes and over 45 years old and at least one cardiac risk factor  Advice Given:  Fluids:   Sip clear fluids only (e.g., water, flat soft drinks, or half-strength fruit juice) until the pain is gone for 2 hours. Then slowly return to a regular diet.  Take medication list to ED.

## 2012-04-20 ENCOUNTER — Observation Stay (HOSPITAL_COMMUNITY): Payer: Self-pay | Admitting: Anesthesiology

## 2012-04-20 ENCOUNTER — Encounter (HOSPITAL_COMMUNITY): Payer: Self-pay | Admitting: Anesthesiology

## 2012-04-20 ENCOUNTER — Encounter (HOSPITAL_COMMUNITY)
Admission: EM | Disposition: A | Discharge status: Home / Self Care | Payer: Self-pay | Source: Home / Self Care | Attending: Emergency Medicine

## 2012-04-20 ENCOUNTER — Observation Stay (HOSPITAL_COMMUNITY): Payer: Self-pay

## 2012-04-20 HISTORY — PX: CHOLECYSTECTOMY: SHX55

## 2012-04-20 SURGERY — LAPAROSCOPIC CHOLECYSTECTOMY WITH INTRAOPERATIVE CHOLANGIOGRAM
Anesthesia: General | Site: Abdomen | Wound class: Clean Contaminated

## 2012-04-20 MED ORDER — NEOSTIGMINE METHYLSULFATE 1 MG/ML IJ SOLN
INTRAMUSCULAR | Status: DC | PRN
  Administered 2012-04-20: 4 mg via INTRAVENOUS

## 2012-04-20 MED ORDER — ARTIFICIAL TEARS OP OINT
TOPICAL_OINTMENT | OPHTHALMIC | Status: DC | PRN
  Administered 2012-04-20: 1 via OPHTHALMIC

## 2012-04-20 MED ORDER — BUPIVACAINE-EPINEPHRINE 0.5% -1:200000 IJ SOLN
INTRAMUSCULAR | Status: DC | PRN
  Administered 2012-04-20: 30 mL

## 2012-04-20 MED ORDER — ONDANSETRON HCL 4 MG/2ML IJ SOLN
4.0000 mg | Freq: Four times a day (QID) | INTRAMUSCULAR | Status: AC | PRN
  Administered 2012-04-21: 4 mg via INTRAVENOUS
  Filled 2012-04-20: qty 2

## 2012-04-20 MED ORDER — SODIUM CHLORIDE 0.9 % IR SOLN
Status: DC | PRN
  Administered 2012-04-20: 1000 mL

## 2012-04-20 MED ORDER — ROCURONIUM BROMIDE 100 MG/10ML IV SOLN
INTRAVENOUS | Status: DC | PRN
  Administered 2012-04-20: 50 mg via INTRAVENOUS

## 2012-04-20 MED ORDER — BIOTENE DRY MOUTH MT LIQD
15.0000 mL | Freq: Two times a day (BID) | OROMUCOSAL | Status: DC
  Administered 2012-04-20 – 2012-04-21 (×2): 15 mL via OROMUCOSAL

## 2012-04-20 MED ORDER — LACTATED RINGERS IV SOLN
INTRAVENOUS | Status: DC
  Administered 2012-04-20: 15:00:00 via INTRAVENOUS

## 2012-04-20 MED ORDER — GLYCOPYRROLATE 0.2 MG/ML IJ SOLN
INTRAMUSCULAR | Status: DC | PRN
  Administered 2012-04-20: .6 mg via INTRAVENOUS

## 2012-04-20 MED ORDER — SODIUM CHLORIDE 0.9 % IV SOLN
INTRAVENOUS | Status: DC | PRN
  Administered 2012-04-20: 16:00:00

## 2012-04-20 MED ORDER — MIDAZOLAM HCL 5 MG/5ML IJ SOLN
INTRAMUSCULAR | Status: DC | PRN
  Administered 2012-04-20: 2 mg via INTRAVENOUS

## 2012-04-20 MED ORDER — HYDROCODONE-ACETAMINOPHEN 5-325 MG PO TABS
1.0000 | ORAL_TABLET | ORAL | Status: DC | PRN
  Administered 2012-04-21 (×2): 1 via ORAL
  Filled 2012-04-20 (×2): qty 1

## 2012-04-20 MED ORDER — OXYCODONE HCL 5 MG PO TABS
5.0000 mg | ORAL_TABLET | Freq: Once | ORAL | Status: AC | PRN
Start: 2012-04-20 — End: 2012-04-20

## 2012-04-20 MED ORDER — OXYCODONE HCL 5 MG/5ML PO SOLN: 5.0000 mg | Freq: Once | ORAL | Status: AC | PRN

## 2012-04-20 MED ORDER — 0.9 % SODIUM CHLORIDE (POUR BTL) OPTIME
TOPICAL | Status: DC | PRN
  Administered 2012-04-20: 1000 mL

## 2012-04-20 MED ORDER — PROPOFOL 10 MG/ML IV BOLUS
INTRAVENOUS | Status: DC | PRN
  Administered 2012-04-20: 180 mg via INTRAVENOUS

## 2012-04-20 MED ORDER — ONDANSETRON HCL 4 MG/2ML IJ SOLN
INTRAMUSCULAR | Status: DC | PRN
  Administered 2012-04-20: 4 mg via INTRAVENOUS

## 2012-04-20 MED ORDER — HYDROMORPHONE HCL PF 1 MG/ML IJ SOLN
0.2500 mg | INTRAMUSCULAR | Status: DC | PRN
  Administered 2012-04-20 (×2): 0.5 mg via INTRAVENOUS

## 2012-04-20 MED ORDER — LACTATED RINGERS IV SOLN
INTRAVENOUS | Status: DC | PRN
  Administered 2012-04-20: 16:00:00 via INTRAVENOUS

## 2012-04-20 MED ORDER — CIPROFLOXACIN IN D5W 400 MG/200ML IV SOLN
400.0000 mg | INTRAVENOUS | Status: DC
  Filled 2012-04-20: qty 200

## 2012-04-20 MED ORDER — CHLORHEXIDINE GLUCONATE 0.12 % MT SOLN
15.0000 mL | Freq: Two times a day (BID) | OROMUCOSAL | Status: DC
  Administered 2012-04-20 (×2): 15 mL via OROMUCOSAL
  Filled 2012-04-20 (×3): qty 15

## 2012-04-20 MED ORDER — HYDROMORPHONE HCL PF 1 MG/ML IJ SOLN
INTRAMUSCULAR | Status: AC
  Filled 2012-04-20: qty 1

## 2012-04-20 MED ORDER — LIDOCAINE HCL (CARDIAC) 20 MG/ML IV SOLN
INTRAVENOUS | Status: DC | PRN
  Administered 2012-04-20: 60 mg via INTRAVENOUS

## 2012-04-20 MED ORDER — FENTANYL CITRATE 0.05 MG/ML IJ SOLN
INTRAMUSCULAR | Status: DC | PRN
  Administered 2012-04-20: 100 ug via INTRAVENOUS
  Administered 2012-04-20 (×3): 50 ug via INTRAVENOUS

## 2012-04-20 MED ORDER — CIPROFLOXACIN IN D5W 400 MG/200ML IV SOLN
INTRAVENOUS | Status: DC | PRN
  Administered 2012-04-20: 400 mg via INTRAVENOUS

## 2012-04-20 SURGICAL SUPPLY — 38 items
ADH SKN CLS APL DERMABOND .7 (GAUZE/BANDAGES/DRESSINGS) ×1
APPLIER CLIP ROT 10 11.4 M/L (STAPLE) ×2
APR CLP MED LRG 11.4X10 (STAPLE) ×1
BAG SPEC RTRVL LRG 6X4 10 (ENDOMECHANICALS) ×1
BLADE SURG ROTATE 9660 (MISCELLANEOUS) IMPLANT
CANISTER SUCTION 2500CC (MISCELLANEOUS) ×2 IMPLANT
CATH REDDICK CHOLANGI 4FR 50CM (CATHETERS) ×2 IMPLANT
CHLORAPREP W/TINT 26ML (MISCELLANEOUS) ×2 IMPLANT
CLIP APPLIE ROT 10 11.4 M/L (STAPLE) ×1 IMPLANT
CLOTH BEACON ORANGE TIMEOUT ST (SAFETY) ×2 IMPLANT
COVER MAYO STAND STRL (DRAPES) ×3 IMPLANT
COVER SURGICAL LIGHT HANDLE (MISCELLANEOUS) ×2 IMPLANT
DECANTER SPIKE VIAL GLASS SM (MISCELLANEOUS) ×4 IMPLANT
DERMABOND ADVANCED (GAUZE/BANDAGES/DRESSINGS) ×1
DERMABOND ADVANCED .7 DNX12 (GAUZE/BANDAGES/DRESSINGS) ×1 IMPLANT
DRAPE C-ARM 42X72 X-RAY (DRAPES) ×2 IMPLANT
DRAPE UTILITY 15X26 W/TAPE STR (DRAPE) ×4 IMPLANT
ELECT REM PT RETURN 9FT ADLT (ELECTROSURGICAL) ×2
ELECTRODE REM PT RTRN 9FT ADLT (ELECTROSURGICAL) ×1 IMPLANT
GLOVE BIO SURGEON STRL SZ7.5 (GLOVE) ×2 IMPLANT
GOWN STRL NON-REIN LRG LVL3 (GOWN DISPOSABLE) ×8 IMPLANT
IV CATH 14GX2 1/4 (CATHETERS) ×2 IMPLANT
KIT BASIN OR (CUSTOM PROCEDURE TRAY) ×2 IMPLANT
KIT ROOM TURNOVER OR (KITS) ×2 IMPLANT
NS IRRIG 1000ML POUR BTL (IV SOLUTION) ×2 IMPLANT
PAD ARMBOARD 7.5X6 YLW CONV (MISCELLANEOUS) ×4 IMPLANT
POUCH SPECIMEN RETRIEVAL 10MM (ENDOMECHANICALS) ×2 IMPLANT
SCISSORS LAP 5X35 DISP (ENDOMECHANICALS) IMPLANT
SET IRRIG TUBING LAPAROSCOPIC (IRRIGATION / IRRIGATOR) ×2 IMPLANT
SLEEVE ENDOPATH XCEL 5M (ENDOMECHANICALS) ×2 IMPLANT
SPECIMEN JAR SMALL (MISCELLANEOUS) ×2 IMPLANT
SUT MNCRL AB 4-0 PS2 18 (SUTURE) ×2 IMPLANT
TOWEL OR 17X24 6PK STRL BLUE (TOWEL DISPOSABLE) ×2 IMPLANT
TOWEL OR 17X26 10 PK STRL BLUE (TOWEL DISPOSABLE) ×2 IMPLANT
TRAY LAPAROSCOPIC (CUSTOM PROCEDURE TRAY) ×2 IMPLANT
TROCAR XCEL BLUNT TIP 100MML (ENDOMECHANICALS) ×2 IMPLANT
TROCAR XCEL NON-BLD 11X100MML (ENDOMECHANICALS) ×2 IMPLANT
TROCAR XCEL NON-BLD 5MMX100MML (ENDOMECHANICALS) ×2 IMPLANT

## 2012-04-20 NOTE — Interval H&P Note (Signed)
History and Physical Interval Note:  04/20/2012 3:20 PM  Abigail [name]  has presented today for surgery, with the diagnosis of cholecystitis  The various methods of treatment have been discussed with the patient and family. After consideration of risks, benefits and other options for treatment, the patient has consented to  Procedure(s) (LRB) with comments: LAPAROSCOPIC CHOLECYSTECTOMY WITH INTRAOPERATIVE CHOLANGIOGRAM (N/A) as a surgical intervention .  The patient's history has been reviewed, patient examined, no change in status, stable for surgery.  I have reviewed the patient's chart and labs.  Questions were answered to the patient's satisfaction.     TOTH III,PAUL S

## 2012-04-20 NOTE — Transfer of Care (Signed)
Immediate Anesthesia Transfer of Care Note  Patient: Abigail [name]  Procedure(s) Performed: Procedure(s) (LRB) with comments: LAPAROSCOPIC CHOLECYSTECTOMY WITH INTRAOPERATIVE CHOLANGIOGRAM (N/A)  Patient Location: PACU  Anesthesia Type:General  Level of Consciousness: awake, alert , oriented and patient cooperative  Airway & Oxygen Therapy: Patient Spontanous Breathing and Patient connected to nasal cannula oxygen  Post-op Assessment: Report given to PACU RN, Post -op Vital signs reviewed and stable and Patient moving all extremities  Post vital signs: Reviewed and stable  Complications: No apparent anesthesia complications

## 2012-04-20 NOTE — Anesthesia Procedure Notes (Signed)
Procedure Name: Intubation Date/Time: 04/20/2012 4:08 PM Performed by: Angelica Pou Pre-anesthesia Checklist: Patient identified, Timeout performed, Emergency Drugs available, Suction available and Patient being monitored Patient Re-evaluated:Patient Re-evaluated prior to inductionOxygen Delivery Method: Circle system utilized Preoxygenation: Pre-oxygenation with 100% oxygen Intubation Type: IV induction Ventilation: Mask ventilation without difficulty Laryngoscope Size: Mac and 3 Grade View: Grade I Tube type: Oral Tube size: 7.0 mm Number of attempts: 1 Airway Equipment and Method: Stylet Placement Confirmation: ETT inserted through vocal cords under direct vision,  breath sounds checked- equal and bilateral and positive ETCO2 Secured at: 22 cm Tube secured with: Tape Dental Injury: Teeth and Oropharynx as per pre-operative assessment

## 2012-04-20 NOTE — Anesthesia Preprocedure Evaluation (Signed)
Anesthesia Evaluation  Patient identified by MRN, date of birth, ID band Patient awake    Reviewed: Allergy & Precautions, H&P , NPO status , Patient's Chart, lab work & pertinent test results  Airway Mallampati: II  Neck ROM: full    Dental   Pulmonary asthma ,          Cardiovascular hypertension,     Neuro/Psych Anxiety    GI/Hepatic GERD-  ,  Endo/Other  obese  Renal/GU      Musculoskeletal   Abdominal   Peds  Hematology   Anesthesia Other Findings   Reproductive/Obstetrics                           Anesthesia Physical Anesthesia Plan  ASA: II  Anesthesia Plan: General   Post-op Pain Management:    Induction: Intravenous  Airway Management Planned: Oral ETT  Additional Equipment:   Intra-op Plan:   Post-operative Plan: Extubation in OR  Informed Consent: I have reviewed the patients History and Physical, chart, labs and discussed the procedure including the risks, benefits and alternatives for the proposed anesthesia with the patient or authorized representative who has indicated his/her understanding and acceptance.     Plan Discussed with: CRNA and Surgeon  Anesthesia Plan Comments:         Anesthesia Quick Evaluation

## 2012-04-20 NOTE — Anesthesia Postprocedure Evaluation (Signed)
Anesthesia Post Note  Patient: Abigail [name]  Procedure(s) Performed: Procedure(s) (LRB): LAPAROSCOPIC CHOLECYSTECTOMY WITH INTRAOPERATIVE CHOLANGIOGRAM (N/A)  Anesthesia type: General  Patient location: PACU  Post pain: Pain level controlled and Adequate analgesia  Post assessment: Post-op Vital signs reviewed, Patient's Cardiovascular Status Stable, Respiratory Function Stable, Patent Airway and Pain level controlled  Last Vitals:  Filed Vitals:   04/20/12 1736  BP: 146/83  Pulse: 55  Temp:   Resp: 16    Post vital signs: Reviewed and stable  Level of consciousness: awake, alert  and oriented  Complications: No apparent anesthesia complications

## 2012-04-20 NOTE — Op Note (Signed)
04/19/2012 - 04/20/2012  5:03 PM  PATIENT:  Abigail [name]  52 y.o. female  PRE-OPERATIVE DIAGNOSIS:  cholecystitis  POST-OPERATIVE DIAGNOSIS:  cholecystitis  PROCEDURE:  Procedure(s) (LRB) with comments: LAPAROSCOPIC CHOLECYSTECTOMY WITH INTRAOPERATIVE CHOLANGIOGRAM (N/A)  SURGEON:  Surgeon(s) and Role:    * Robyne Askew, MD - Primary  PHYSICIAN ASSISTANT:   ASSISTANTS: Dr. Donell Beers   ANESTHESIA:   general  EBL:  Total I/O In: 2000 [I.V.:2000] Out: -   BLOOD ADMINISTERED:none  DRAINS: none   LOCAL MEDICATIONS USED:  MARCAINE     SPECIMEN:  Source of Specimen:  gallbladder  DISPOSITION OF SPECIMEN:  PATHOLOGY  COUNTS:  YES  TOURNIQUET:  * No tourniquets in log *  DICTATION: .Dragon Dictation @opnoteheader @  Procedure: After informed consent was obtained the patient was brought to the operating room and placed in the supine position on the operating room table. After adequate induction of general anesthesia the patient's abdomen was prepped with ChloraPrep allowed to dry and draped in usual sterile manner. The area below the umbilicus was infiltrated with quarter percent  Marcaine. A small incision was made with a 15 blade knife. The incision was carried down through the subcutaneous tissue bluntly with a hemostat and [employer] retractors. The linea alba was identified. The linea alba was incised with a 15 blade knife and each side was grasped with Coker clamps. The preperitoneal space was then probed with a hemostat until the peritoneum was opened and access was gained to the abdominal cavity. A 0 Vicryl pursestring stitch was placed in the fascia surrounding the opening. A Hassan cannula was then placed through the opening and anchored in place with the previously placed Vicryl purse string stitch. The abdomen was insufflated with carbon dioxide without difficulty. A laparoscope was inserted through the [facility] cannula in the right upper quadrant was inspected. Next the  epigastric region was infiltrated with % Marcaine. A small incision was made with a 15 blade knife. A 10 mm port was placed bluntly through this incision into the abdominal cavity under direct vision. Next 2 sites were chosen laterally on the right side of the abdomen for placement of 5 mm ports. Each of these areas was infiltrated with quarter percent Marcaine. Small stab incisions were made with a 15 blade knife. 5 mm ports were then placed bluntly through these incisions into the abdominal cavity under direct vision without difficulty. A blunt grasper was placed through the lateralmost 5 mm port and used to grasp the dome of the gallbladder and elevated anteriorly and superiorly. Another blunt grasper was placed through the other 5 mm port and used to retract the body and neck of the gallbladder. There were some omental adhesions to the body of the gallbladder that were taken down sharply with laparoscopic scissors. A dissector was placed through the epigastric port and using the electrocautery the peritoneal reflection at the gallbladder neck was opened. Blunt dissection was then carried out in this area until the gallbladder neck-cystic duct junction was readily identified and a good window was created. A single clip was placed on the gallbladder neck. A small  ductotomy was made just below the clip with laparoscopic scissors. A 14-gauge Angiocath was then placed through the anterior abdominal wall under direct vision. A Reddick cholangiogram catheter was then placed through the Angiocath and flushed. The catheter was then placed in the cystic duct and anchored in place with a clip. A cholangiogram was obtained that showed no filling defects good emptying into  the duodenum an adequate length on the cystic duct. The anchoring clip and catheters were then removed from the patient. 3 clips were placed proximally on the cystic duct and the duct was divided between the 2 sets of clips. Posterior to this the cystic  artery was identified and again dissected bluntly in a circumferential manner until a good window  was created. 2 clips were placed proximally and one distally on the artery and the artery was divided between the 2 sets of clips. Next a laparoscopic hook cautery device was used to separate the gallbladder from the liver bed. Prior to completely detaching the gallbladder from the liver bed the liver bed was inspected and several small bleeding points were coagulated with the electrocautery until the area was completely hemostatic. The gallbladder was then detached the rest of it from the liver bed without difficulty. A laparoscopic bag was inserted through the epigastric port. The gallbladder was placed within the bag and the bag was sealed. A laparoscope was then moved to the epigastric port. The gallbladder grasper was placed through the [facility] cannula and used to grasp the opening of the bag. The bag with the gallbladder was then removed with the [facility] cannula through the infraumbilical port without difficulty. The fascial defect was then closed with the previously placed Vicryl pursestring stitch as well as with another figure-of-eight 0 Vicryl stitch. The liver bed was inspected again and found to be hemostatic. The abdomen was irrigated with copious amounts of saline until the effluent was clear. The ports were then removed under direct vision without difficulty and were found to be hemostatic. The gas was allowed to escape. The skin incisions were all closed with interrupted 4-0 Monocryl subcuticular stitches. Dermabond dressings were applied. The patient tolerated the procedure well. At the end of the case all needle sponge and instrument counts were correct. The patient was then awakened and taken to recovery in stable condition   PLAN OF CARE: Admit to inpatient   PATIENT DISPOSITION:  PACU - hemodynamically stable.   Delay start of Pharmacological VTE agent (>24hrs) due to surgical blood loss or  risk of bleeding: yes

## 2012-04-20 NOTE — Preoperative (Signed)
Beta Blockers   Reason not to administer Beta Blockers:Not Applicable, took metoprolol this am 

## 2012-04-20 NOTE — Progress Notes (Signed)
Patient transferred from PACU to 6N25.  Alert and oriented, VSS, friend at bedside.  Abdominal incisions with skin adhesive dry and intact.  Will continue to monitor.

## 2012-04-21 LAB — URINE CULTURE: Colony Count: >=100000

## 2012-04-21 MED ORDER — HYDROMORPHONE HCL PF 1 MG/ML IJ SOLN: 0.2500 mg | INTRAMUSCULAR | Status: DC | PRN

## 2012-04-21 MED ORDER — HYDROCODONE-ACETAMINOPHEN 5-325 MG PO TABS: 1.0000 | ORAL_TABLET | ORAL | Status: DC | PRN

## 2012-04-21 NOTE — Progress Notes (Signed)
Patient discharged to home.  Discharge teaching completed including follow up care, medications, wound care and signs and symptoms of infection.  Verbalizes understanding with no further questions.  Vital signs stable, tolerating regular diet without complaints of nausea. Pain controlled on PO pain meds.  Discharged per wheelchair with friend.

## 2012-04-21 NOTE — Discharge Summary (Signed)
Physician Discharge Summary  Patient ID: Abigail [name] MRN: [id number] DOB/AGE: January 06, 1961 52 y.o.  Admit date: 04/19/2012 Discharge date: 04/21/2012  Admitting Diagnosis: Same as discharge  Discharge Diagnosis Patient Active Problem List   Diagnosis Date Noted  . Biliary colic 04/19/2012  . Anxiety 04/10/2012  . Pulmonary nodules 04/10/2012  . Gastritis 03/17/2012  . Pancreatitis, acute 03/13/2012  . Transaminitis 05/17/2011  . Other screening mammogram 05/17/2011  . B12 DEFICIENCY 01/31/2010  . ARTHRALGIA 01/31/2010  . HYPERTENSION 08/16/2006  . ASTHMA, EXTRINSIC NOS 08/16/2006  . ESOPHAGITIS 08/16/2006  . GERD 08/16/2006  Cholecystitis  Consultants None  Imaging: Dg Cholangiogram Operative  04/20/2012  *RADIOLOGY REPORT*  Clinical Data:   Laparoscopic cholecystectomy.  INTRAOPERATIVE CHOLANGIOGRAM  Technique:  Cholangiographic images from the C-arm fluoroscopic device were submitted for interpretation post-operatively.  Please see the procedural report for the amount of contrast and the fluoroscopy time utilized.  Comparison:  None.  Findings:  Opacification of the biliary tree reveals no filling defects and no dilatation.  Contrast flows readily into the duodenum.  IMPRESSION: Uncomplicated intraoperative cholangiogram.   Original Report Authenticated By: Leanna Battles, M.D.     Procedures Laparoscopic cholecystectomy with [facility]  Hospital Course:  52 yo female with a h/o lap gastric roux-en-Y bypass who about a month ago started having post prandial abdominal pain. She had nausea and vomiting and was admitted to [facility] for dehydration. She was also found to have an elevated lipase and gallstones and sludge. Surgery thought she needed her gallbladder out, but GI apparently did an endoscopy and ended up not having a cholecystectomy. She felt better, but this past Friday she ate some pizza and started having abdominal pain in the epigastrium and RUQ.  She then developed nausea and  vomiting. She called her PCP who referred her here. We have been asked to see her for admission.  Workup showed Biliary colic, elevated LFTs, and normal WBC.  Patient was admitted and underwent procedure listed above.  Tolerated procedure well and was transferred to the floor.  Diet was advanced as tolerated.  On POD #1, the patient was voiding well, tolerating diet, ambulating well, pain well controlled, vital signs stable, incisions c/d/i and felt stable for discharge home.  Patient will follow up in our office in 2 weeks and knows to call with questions or concerns.  Physical Exam: General:  Alert, NAD, pleasant, comfortable Abd:  Soft, ND, mild tenderness, incisions C/D/I    Medication List     As of 04/21/2012  9:15 AM    TAKE these medications         calcium-vitamin D 500-200 MG-UNIT per tablet   Commonly known as: OSCAL WITH D   Take 1 tablet by mouth daily.      cyanocobalamin 1000 MCG/ML injection   Commonly known as: (VITAMIN B-12)   Inject 1,000 mcg into the muscle every 6 (six) months.      diphenhydrAMINE 25 MG tablet   Commonly known as: BENADRYL   Take 25 mg by mouth at bedtime.      HYDROcodone-acetaminophen 5-325 MG per tablet   Commonly known as: NORCO/VICODIN   Take 1-2 tablets by mouth every 4 (four) hours as needed.      metoprolol 50 MG tablet   Commonly known as: LOPRESSOR   Take 1 tablet (50 mg total) by mouth 2 (two) times daily.      multivitamin tablet   Take 1 tablet by mouth daily.  pantoprazole 40 MG tablet   Commonly known as: PROTONIX   Take 1 tablet (40 mg total) by mouth 2 (two) times daily.      promethazine 25 MG tablet   Commonly known as: PHENERGAN   Take 1 tablet (25 mg total) by mouth every 6 (six) hours as needed for nausea.      sucralfate 1 G tablet   Commonly known as: CARAFATE   Take 1 tablet (1 g total) by mouth 3 (three) times daily before meals.      traZODone 50 MG tablet   Commonly known as: DESYREL   Take 50 mg  by mouth at bedtime.          Follow-up Information    Follow up with [facility]. On 05/10/2012. (APPT ON 05/10/12 AT 11:00AM, PLEASE ARRIVE AT 10:30AM FOR CHECK IN)    Contact information:   [address]   [place] [address] [phone]          Signed: Candiss Norse [facility] [phone]  04/21/2012, 8:59 AM

## 2012-04-21 NOTE — Addendum Note (Signed)
Addendum  created 04/21/12 1347 by Angelica Pou, RN   Modules edited:Charges VN

## 2012-04-22 ENCOUNTER — Encounter (HOSPITAL_COMMUNITY): Payer: Self-pay | Admitting: General Surgery

## 2012-04-22 NOTE — Telephone Encounter (Signed)
3 Month FU CT set up at [facility] on 07/15/2012 at 10am. [facility] Order faxed and Patient aware of appt.

## 2012-05-04 ENCOUNTER — Telehealth (INDEPENDENT_AMBULATORY_CARE_PROVIDER_SITE_OTHER): Payer: Self-pay

## 2012-05-04 NOTE — Telephone Encounter (Signed)
Pt called requesting refill on narcotic meds. Pt is 2 wks po gb. Pt cx appt for tomorrow for po f/u.  Pt states she takes them at night and she sleeps better. Pt states doing well and still has some soreness at umbilical incision. Pt has returned to work. Pt advised if she is able to take tylenol we recommend this since she is working. Pt to call if she has increased pain or other symptoms occur.

## 2012-05-10 ENCOUNTER — Encounter (INDEPENDENT_AMBULATORY_CARE_PROVIDER_SITE_OTHER): Payer: Self-pay

## 2012-05-17 ENCOUNTER — Encounter (INDEPENDENT_AMBULATORY_CARE_PROVIDER_SITE_OTHER): Payer: Self-pay | Admitting: Internal Medicine

## 2012-05-17 ENCOUNTER — Ambulatory Visit (INDEPENDENT_AMBULATORY_CARE_PROVIDER_SITE_OTHER): Payer: Self-pay | Admitting: Internal Medicine

## 2012-05-17 VITALS — BP 132/84 | HR 73 | Temp 98.0°F | Resp 18 | Ht 61.0 in | Wt 163.0 lb

## 2012-05-17 DIAGNOSIS — K810 Acute cholecystitis: Secondary | ICD-10-CM

## 2012-05-17 NOTE — Progress Notes (Signed)
  Subjective: Pt returns to the clinic today after undergoing laparoscopic cholecystectomy on 04/20/12 by Dr. Carolynne Edouard.  Pt reports feeling full very fast and having to extend the amount of time she eats to allow for digestion.  She has had a roux en y bypass over 5 years ago.  She did have a recent endoscopy which showed a marginal ulcer at the GJ junction.  She denies any significant vomiting, no real weight loss,  Bowel function is good.  No major problems with the wounds.  She is on protonix bid and carafate with meals.  Objective: Vital signs in last 24 hours: Reviewed  PE: Abd: soft, non-tender, +bs, incisions well healed  Lab Results:  No results found for this basename: WBC, HGB, HCT, PLT,  in the last 72 hours BMET No results found for this basename: NA, K, CL, CO2, GLUCOSE, BUN, CREATININE, CALCIUM,  in the last 72 hours PT/INR No results found for this basename: LABPROT, INR,  in the last 72 hours CMP     Component Value Date/Time   NA 141 04/19/2012 1147   K 3.6 04/19/2012 1147   CL 98 04/19/2012 1147   CO2 27 04/19/2012 1147   GLUCOSE 103* 04/19/2012 1147   BUN 9 04/19/2012 1147   CREATININE 0.60 04/19/2012 1147   CALCIUM 10.1 04/19/2012 1147   PROT 8.1 04/19/2012 1147   ALBUMIN 4.1 04/19/2012 1147   AST 63* 04/19/2012 1147   ALT 46* 04/19/2012 1147   ALKPHOS 55 04/19/2012 1147   BILITOT 1.4* 04/19/2012 1147   GFRNONAA >90 04/19/2012 1147   GFRAA >90 04/19/2012 1147   Lipase     Component Value Date/Time   LIPASE 79* 04/19/2012 1147       Studies/Results: No results found.  Anti-infectives: Anti-infectives   None       Assessment/Plan  1.  S/P Laparoscopic Cholecystectomy: doing well from surgical standpoint but do have concerns about her feeling full quickly as well as the recent diagnosis of an ulcer in her pouch.  I have recommend that she schedule an appointment with Dr. Markham Jordan who did her endoscopy for the next 2-3 weeks, also contact his office or ours if symptoms  worsen, may resume regular activity without restrictions, Pt will follow up with [place] PR[facility] and knows to call with questions or concerns.     Nathanyl Andujo 05/17/2012

## 2012-05-17 NOTE — Patient Instructions (Addendum)
May resume regular activity without restrictions. Follow up as needed. Call with questions or concerns.   Call Dr. Earnest Conroy office and schedule an appointment to recheck symptoms.

## 2012-06-03 ENCOUNTER — Other Ambulatory Visit: Payer: Self-pay

## 2012-06-08 ENCOUNTER — Telehealth: Payer: Self-pay | Admitting: Radiology

## 2012-06-08 NOTE — Telephone Encounter (Signed)
Per Dr Lianne Bushy last note, all ifob sample's found in the [facility] box were label correctly.  This patient was called 3.4.14 with positive ifob results, patient notified Abigail [name] she had not done an ifob test. I have since found the correct patient( Abigail [name] ordered and resulted ifob test under the incorrect patient) I notified the patient that the correct patient was identified and results given to her. A credit has been issued to insure this patient 's insurance will not be billed for the ifob test that was incorrectly ordered on her patient MR#

## 2012-06-08 NOTE — Telephone Encounter (Signed)
Noted. Thanks.

## 2012-07-01 ENCOUNTER — Ambulatory Visit (INDEPENDENT_AMBULATORY_CARE_PROVIDER_SITE_OTHER): Payer: Self-pay | Admitting: General Surgery

## 2012-07-01 ENCOUNTER — Encounter (INDEPENDENT_AMBULATORY_CARE_PROVIDER_SITE_OTHER): Payer: Self-pay | Admitting: General Surgery

## 2012-07-01 VITALS — BP 150/82 | HR 63 | Temp 99.0°F | Resp 18 | Ht 60.0 in | Wt 165.8 lb

## 2012-07-01 DIAGNOSIS — K8010 Calculus of gallbladder with chronic cholecystitis without obstruction: Secondary | ICD-10-CM

## 2012-07-04 ENCOUNTER — Ambulatory Visit
Admission: RE | Admit: 2012-07-04 | Discharge: 2012-07-04 | Disposition: A | Discharge status: Home / Self Care | Payer: No Typology Code available for payment source | Source: Ambulatory Visit | Attending: General Surgery | Admitting: General Surgery

## 2012-07-04 DIAGNOSIS — K8010 Calculus of gallbladder with chronic cholecystitis without obstruction: Secondary | ICD-10-CM

## 2012-07-04 MED ORDER — IOHEXOL 300 MG/ML  SOLN
100.0000 mL | Freq: Once | INTRAMUSCULAR | Status: AC | PRN
  Administered 2012-07-04: 100 mL via INTRAVENOUS

## 2012-07-05 ENCOUNTER — Other Ambulatory Visit: Payer: Self-pay | Admitting: Family Medicine

## 2012-07-05 ENCOUNTER — Other Ambulatory Visit: Payer: Self-pay

## 2012-07-05 NOTE — Telephone Encounter (Signed)
Looks like she is having surgery.  Is it okay to refill this?

## 2012-07-05 NOTE — Telephone Encounter (Signed)
Appears that she had surgery.  rx sent.

## 2012-07-18 ENCOUNTER — Ambulatory Visit (INDEPENDENT_AMBULATORY_CARE_PROVIDER_SITE_OTHER): Payer: Self-pay | Admitting: General Surgery

## 2012-07-18 ENCOUNTER — Encounter (INDEPENDENT_AMBULATORY_CARE_PROVIDER_SITE_OTHER): Payer: Self-pay | Admitting: General Surgery

## 2012-07-18 VITALS — BP 134/98 | HR 74 | Temp 97.4°F | Ht 61.0 in | Wt 165.8 lb

## 2012-07-18 DIAGNOSIS — K8010 Calculus of gallbladder with chronic cholecystitis without obstruction: Secondary | ICD-10-CM

## 2012-07-18 NOTE — Patient Instructions (Signed)
May return to all normal activities 

## 2012-07-25 ENCOUNTER — Encounter (INDEPENDENT_AMBULATORY_CARE_PROVIDER_SITE_OTHER): Payer: Self-pay | Admitting: General Surgery

## 2012-07-25 NOTE — Progress Notes (Signed)
Subjective:     Patient ID: Abigail [name], female   DOB: May 04, 1960, 52 y.o.   MRN: [id number]  HPI The patient is a 52 year old white female who is a couple weeks status post laparoscopic cholecystectomy. Her main complaint is of persistent upper abdominal pain. This is been associated with some nausea. She denies any fevers or chills.  Review of Systems     Objective:   Physical Exam On exam her abdomen is soft. She does have some moderate right upper quadrant tenderness. Her incisions are healing nicely with no sign of infection.    Assessment:     The patient is 2 weeks status post laparoscopic cholecystectomy with persistent right upper quadrant pain. Because of the amount of pain she is having if it would be reasonable to get a CT scan of her abdomen and pelvis to look for any complication of her surgery.     Plan:     We will obtain a CT scan and call her with the results. We'll plan to see her back in the next couple weeks to check her progress.

## 2012-07-25 NOTE — Progress Notes (Signed)
Subjective:     Patient ID: Abigail [name], female   DOB: 04-12-1960, 52 y.o.   MRN: [id number]  HPI The patient is a 52 year old white female who is approximately 3 months status post laparoscopic cholecystectomy. Her pain has resolved. Her appetite is good and her bowels are working normally. She has no complaints today.  Review of Systems     Objective:   Physical Exam  on exam her abdomen is soft and nontender. Her incisions have all healed nicely with no sign of infection.    Assessment:     The patient is 3 months status post laparoscopic cholecystectomy     Plan:     At this point she can return to all her normal activities without any restrictions. We will plan to see her back on a when necessary basis

## 2012-08-01 ENCOUNTER — Ambulatory Visit: Payer: Self-pay | Admitting: Family Medicine

## 2012-08-02 ENCOUNTER — Encounter: Payer: Self-pay | Admitting: Family Medicine

## 2012-08-03 ENCOUNTER — Other Ambulatory Visit: Payer: Self-pay | Admitting: Family Medicine

## 2012-08-03 DIAGNOSIS — R918 Other nonspecific abnormal finding of lung field: Secondary | ICD-10-CM

## 2012-08-03 NOTE — Assessment & Plan Note (Signed)
Due for repeat CT scan in 4/15.  Ordered.

## 2012-08-17 ENCOUNTER — Other Ambulatory Visit: Payer: Self-pay | Admitting: Family Medicine

## 2012-08-17 NOTE — Telephone Encounter (Signed)
Sent, okay to continue with the sent sig.

## 2012-08-17 NOTE — Telephone Encounter (Signed)
Received refill request electronically from pharmacy. Directions from pharmacy does not match medication sheet. Last office visit 04/08/12. Is it okay to refill medication?

## 2012-09-05 ENCOUNTER — Inpatient Hospital Stay: Payer: Self-pay | Admitting: Internal Medicine

## 2012-09-05 LAB — COMPREHENSIVE METABOLIC PANEL
Alkaline Phosphatase: 86 U/L (ref 50–136)
Anion Gap: 11 (ref 7–16)
BUN: 9 mg/dL (ref 7–18)
Bilirubin,Total: 1.6 mg/dL — ABNORMAL HIGH (ref 0.2–1.0)
Calcium, Total: 9.4 mg/dL (ref 8.5–10.1)
Creatinine: 0.75 mg/dL (ref 0.60–1.30)
EGFR (African American): >60
EGFR (Non-African Amer.): >60
Glucose: 120 mg/dL — ABNORMAL HIGH (ref 65–99)
Osmolality: 264 (ref 275–301)
Potassium: 3.6 mmol/L (ref 3.5–5.1)
SGOT(AST): 293 U/L — ABNORMAL HIGH (ref 15–37)
SGPT (ALT): 99 U/L — ABNORMAL HIGH (ref 12–78)
Sodium: 132 mmol/L — ABNORMAL LOW (ref 136–145)
Total Protein: 7.6 g/dL (ref 6.4–8.2)

## 2012-09-05 LAB — URINALYSIS, COMPLETE
Bacteria: NONE SEEN
Blood: NEGATIVE
Hyaline Cast: 9
Nitrite: NEGATIVE
Ph: 5 (ref 4.5–8.0)
Protein: 30
Specific Gravity: 1.02 (ref 1.003–1.030)
Squamous Epithelial: 3
WBC UR: 3 /HPF (ref 0–5)

## 2012-09-05 LAB — CBC
HCT: 43.2 % (ref 35.0–47.0)
HGB: 15 g/dL (ref 12.0–16.0)
MCH: 37.3 pg — ABNORMAL HIGH (ref 26.0–34.0)
MCV: 108 fL — ABNORMAL HIGH (ref 80–100)
RBC: 4.01 10*6/uL (ref 3.80–5.20)
RDW: 17.8 % — ABNORMAL HIGH (ref 11.5–14.5)
WBC: 5.4 10*3/uL (ref 3.6–11.0)

## 2012-09-06 LAB — CBC WITH DIFFERENTIAL/PLATELET
Basophil #: 0 10*3/uL (ref 0.0–0.1)
Basophil %: 0.5 %
Eosinophil #: 0.1 10*3/uL (ref 0.0–0.7)
HCT: 33.3 % — ABNORMAL LOW (ref 35.0–47.0)
HGB: 11.5 g/dL — ABNORMAL LOW (ref 12.0–16.0)
MCHC: 34.6 g/dL (ref 32.0–36.0)
Monocyte #: 0.4 x10 3/mm (ref 0.2–0.9)
Monocyte %: 11.1 %
Neutrophil %: 57.8 %
Platelet: 87 10*3/uL — ABNORMAL LOW (ref 150–440)
RBC: 3.07 10*6/uL — ABNORMAL LOW (ref 3.80–5.20)
WBC: 3.7 10*3/uL (ref 3.6–11.0)

## 2012-09-06 LAB — COMPREHENSIVE METABOLIC PANEL
Albumin: 2.6 g/dL — ABNORMAL LOW (ref 3.4–5.0)
Alkaline Phosphatase: 58 U/L (ref 50–136)
Calcium, Total: 7.7 mg/dL — ABNORMAL LOW (ref 8.5–10.1)
Chloride: 106 mmol/L (ref 98–107)
Creatinine: 0.42 mg/dL — ABNORMAL LOW (ref 0.60–1.30)
EGFR (African American): >60
EGFR (Non-African Amer.): >60
Osmolality: 273 (ref 275–301)
Potassium: 3.4 mmol/L — ABNORMAL LOW (ref 3.5–5.1)
SGOT(AST): 109 U/L — ABNORMAL HIGH (ref 15–37)

## 2012-09-06 LAB — AMYLASE: Amylase: 122 U/L — ABNORMAL HIGH (ref 25–115)

## 2012-09-06 LAB — LIPID PANEL
Cholesterol: 106 mg/dL (ref 0–200)
Ldl Cholesterol, Calc: 17 mg/dL (ref 0–100)
VLDL Cholesterol, Calc: 6 mg/dL (ref 5–40)

## 2012-09-06 LAB — LIPASE, BLOOD: Lipase: 2673 U/L — ABNORMAL HIGH (ref 73–393)

## 2012-09-07 LAB — POTASSIUM: Potassium: 3.6 mmol/L (ref 3.5–5.1)

## 2012-09-07 LAB — LIPASE, BLOOD: Lipase: 699 U/L — ABNORMAL HIGH (ref 73–393)

## 2012-09-07 LAB — AMYLASE: Amylase: 54 U/L (ref 25–115)

## 2012-09-08 LAB — LIPASE, BLOOD: Lipase: 626 U/L — ABNORMAL HIGH (ref 73–393)

## 2012-09-09 LAB — LIPASE, BLOOD: Lipase: 751 U/L — ABNORMAL HIGH (ref 73–393)

## 2012-09-13 ENCOUNTER — Encounter: Payer: Self-pay | Admitting: Family Medicine

## 2012-09-13 DIAGNOSIS — F1011 Alcohol abuse, in remission: Secondary | ICD-10-CM | POA: Insufficient documentation

## 2012-09-23 ENCOUNTER — Ambulatory Visit: Payer: Self-pay | Admitting: Family Medicine

## 2012-10-19 ENCOUNTER — Ambulatory Visit (INDEPENDENT_AMBULATORY_CARE_PROVIDER_SITE_OTHER): Payer: Self-pay | Admitting: Family Medicine

## 2012-10-19 ENCOUNTER — Encounter: Payer: Self-pay | Admitting: Family Medicine

## 2012-10-19 VITALS — BP 148/90 | HR 84 | Temp 98.3°F | Wt 154.5 lb

## 2012-10-19 DIAGNOSIS — R918 Other nonspecific abnormal finding of lung field: Secondary | ICD-10-CM

## 2012-10-19 DIAGNOSIS — F411 Generalized anxiety disorder: Secondary | ICD-10-CM

## 2012-10-19 DIAGNOSIS — F419 Anxiety disorder, unspecified: Secondary | ICD-10-CM

## 2012-10-19 DIAGNOSIS — E538 Deficiency of other specified B group vitamins: Secondary | ICD-10-CM

## 2012-10-19 DIAGNOSIS — F1020 Alcohol dependence, uncomplicated: Secondary | ICD-10-CM

## 2012-10-19 DIAGNOSIS — K859 Acute pancreatitis without necrosis or infection, unspecified: Secondary | ICD-10-CM

## 2012-10-19 MED ORDER — CITALOPRAM HYDROBROMIDE 20 MG PO TABS: 20.0000 mg | ORAL_TABLET | Freq: Every day | ORAL | Status: DC

## 2012-10-19 MED ORDER — HYDROXYZINE PAMOATE 50 MG PO CAPS: 50.0000 mg | ORAL_CAPSULE | Freq: Three times a day (TID) | ORAL | Status: DC | PRN

## 2012-10-19 NOTE — Progress Notes (Signed)
Recently admitted with pancreatitis [facility]) after drinking.  She had another episode of drinking on 10/16/12 with subsequent epigastric abd pain and 1 episode of vomiting.  This was less severe than prev and didn't require admission.  Not drinking etoh now. No one else is drinking etoh at home, ie in the home.  She admits "I'm an alcoholic."  She has gone to 2 AA meetings prev.  Prev was sober for 6 months per report.  No SI/HI.  Wants help.  Asking for advice.  Sig anxiety related to family stressors, death of her sister prev.   H/o gastric bypass noted.   PMH and SH reviewed  ROS: See HPI, otherwise noncontributory.  Meds, vitals, and allergies reviewed.   nad ncat Mmm rrr ctab abd soft, not ttp

## 2012-10-19 NOTE — Patient Instructions (Addendum)
Stop the trazodone.  Use vistaril for anxiety if needed and start the citalopram today.  Get to a meeting today or tomorrow.  We'll be in touch.  Recheck in 1 week.

## 2012-10-19 NOTE — Assessment & Plan Note (Signed)
We can repeat her B12 injection at future visits.

## 2012-10-19 NOTE — Assessment & Plan Note (Signed)
Encouraged to get into meetings.  Will notify pt about intensive outpatient program.  See notes on labs.  Encouraged sobriety.  She has a possible sponsor for [employer]

## 2012-10-19 NOTE — Assessment & Plan Note (Addendum)
Start citalopram and use vistaril prn for anxiety.  D/w pt.  No SI/HI.  See discussion re: etoh.  Routine cautions re: SSRI given.

## 2012-10-20 LAB — CBC WITH DIFFERENTIAL/PLATELET
Basophils Relative: 0.2 % (ref 0.0–3.0)
Eosinophils Relative: 1.5 % (ref 0.0–5.0)
Hemoglobin: 13.7 g/dL (ref 12.0–15.0)
Lymphocytes Relative: 36.6 % (ref 12.0–46.0)
MCV: 102.7 fl — ABNORMAL HIGH (ref 78.0–100.0)
Neutro Abs: 2.3 10*3/uL (ref 1.4–7.7)
Neutrophils Relative %: 50.7 % (ref 43.0–77.0)
RBC: 4.03 Mil/uL (ref 3.87–5.11)
WBC: 4.6 10*3/uL (ref 4.5–10.5)

## 2012-10-20 LAB — COMPREHENSIVE METABOLIC PANEL
Albumin: 3.7 g/dL (ref 3.5–5.2)
Alkaline Phosphatase: 65 U/L (ref 39–117)
BUN: 10 mg/dL (ref 6–23)
Calcium: 9.4 mg/dL (ref 8.4–10.5)
Creatinine, Ser: 0.6 mg/dL (ref 0.4–1.2)
Glucose, Bld: 88 mg/dL (ref 70–99)
Potassium: 4 mEq/L (ref 3.5–5.1)

## 2012-10-28 ENCOUNTER — Telehealth: Payer: Self-pay | Admitting: *Deleted

## 2012-10-28 ENCOUNTER — Other Ambulatory Visit (INDEPENDENT_AMBULATORY_CARE_PROVIDER_SITE_OTHER): Payer: Self-pay

## 2012-10-28 DIAGNOSIS — R748 Abnormal levels of other serum enzymes: Secondary | ICD-10-CM

## 2012-10-28 DIAGNOSIS — E538 Deficiency of other specified B group vitamins: Secondary | ICD-10-CM

## 2012-10-28 LAB — ALT: ALT: 37 U/L — ABNORMAL HIGH (ref 0–35)

## 2012-10-28 LAB — AST: AST: 35 U/L (ref 0–37)

## 2012-10-28 MED ORDER — CYANOCOBALAMIN 1000 MCG/ML IJ SOLN
1000.0000 ug | Freq: Once | INTRAMUSCULAR | Status: AC
  Administered 2012-10-28: 1000 ug via INTRAMUSCULAR

## 2012-10-28 MED ORDER — CITALOPRAM HYDROBROMIDE 20 MG PO TABS: 20.0000 mg | ORAL_TABLET | Freq: Every day | ORAL | Status: DC

## 2012-10-28 MED ORDER — PROMETHAZINE HCL 25 MG PO TABS: 25.0000 mg | ORAL_TABLET | Freq: Four times a day (QID) | ORAL | Status: DC | PRN

## 2012-10-28 MED ORDER — SUCRALFATE 1 G PO TABS: 1.0000 g | ORAL_TABLET | Freq: Three times a day (TID) | ORAL | Status: DC

## 2012-10-28 MED ORDER — PANTOPRAZOLE SODIUM 40 MG PO TBEC: DELAYED_RELEASE_TABLET | ORAL | Status: DC

## 2012-10-28 MED ORDER — TRAMADOL HCL 50 MG PO TABS: 50.0000 mg | ORAL_TABLET | Freq: Every evening | ORAL | Status: DC | PRN

## 2012-10-28 MED ORDER — METOPROLOL TARTRATE 50 MG PO TABS: 50.0000 mg | ORAL_TABLET | Freq: Two times a day (BID) | ORAL | Status: DC

## 2012-10-28 NOTE — Telephone Encounter (Signed)
B12 injection given

## 2012-10-28 NOTE — Telephone Encounter (Signed)
Tramadol printed, other sent.  D/w pt.  Recheck B12 with AST/ALT, then give B12 injection IM.  Thanks.  She is doing better with the vistaril.  We discussed her situation.  She isn't drinking.  She is going to get into meetings and/or call about outpatient program.

## 2012-10-28 NOTE — Addendum Note (Signed)
Addended by: Annamarie Major on: 10/28/2012 02:22 PM   Modules accepted: Orders

## 2012-10-28 NOTE — Telephone Encounter (Signed)
1) Patient was here last Wed 10/19/12 and said that you went over her medications and she thought that you were refilling them.  She needs refills on everything.  Please let me know if you prescribe everything and I'll take care of it. 2) She said you also mentioned B-12 injections.  Please advise. She is here now for lab work and I can do the B-12 now if indicated.

## 2012-11-01 ENCOUNTER — Encounter: Payer: Self-pay | Admitting: *Deleted

## 2013-02-16 ENCOUNTER — Other Ambulatory Visit: Payer: Self-pay | Admitting: Family Medicine

## 2013-02-17 NOTE — Telephone Encounter (Signed)
Electronic refill request.  Please advise. 

## 2013-02-17 NOTE — Telephone Encounter (Signed)
Sent!

## 2013-06-16 ENCOUNTER — Observation Stay: Payer: Self-pay | Admitting: Internal Medicine

## 2013-06-16 LAB — COMPREHENSIVE METABOLIC PANEL
ALBUMIN: 3.9 g/dL (ref 3.4–5.0)
ALK PHOS: 61 U/L
AST: 68 U/L — AB (ref 15–37)
Anion Gap: 14 (ref 7–16)
BUN: 13 mg/dL (ref 7–18)
Bilirubin,Total: 1.1 mg/dL — ABNORMAL HIGH (ref 0.2–1.0)
CALCIUM: 8.2 mg/dL — AB (ref 8.5–10.1)
CHLORIDE: 102 mmol/L (ref 98–107)
CO2: 20 mmol/L — AB (ref 21–32)
CREATININE: 0.64 mg/dL (ref 0.60–1.30)
EGFR (Non-African Amer.): >60
Glucose: 92 mg/dL (ref 65–99)
OSMOLALITY: 272 (ref 275–301)
POTASSIUM: 3.2 mmol/L — AB (ref 3.5–5.1)
SGPT (ALT): 34 U/L (ref 12–78)
SODIUM: 136 mmol/L (ref 136–145)
Total Protein: 7.9 g/dL (ref 6.4–8.2)

## 2013-06-16 LAB — ETHANOL
ETHANOL %: 0.326 % — AB (ref 0.000–0.080)
ETHANOL LVL: 326 mg/dL — AB

## 2013-06-16 LAB — URINALYSIS, COMPLETE
BACTERIA: NONE SEEN
BILIRUBIN, UR: NEGATIVE
Blood: NEGATIVE
Glucose,UR: NEGATIVE mg/dL (ref 0–75)
Hyaline Cast: 1
Leukocyte Esterase: NEGATIVE
NITRITE: NEGATIVE
Ph: 5 (ref 4.5–8.0)
Protein: 30
Specific Gravity: 1.024 (ref 1.003–1.030)
WBC UR: <1 /HPF (ref 0–5)

## 2013-06-16 LAB — CBC
HCT: 43.1 % (ref 35.0–47.0)
HGB: 13.5 g/dL (ref 12.0–16.0)
MCH: 30.4 pg (ref 26.0–34.0)
MCHC: 31.3 g/dL — ABNORMAL LOW (ref 32.0–36.0)
MCV: 97 fL (ref 80–100)
PLATELETS: 251 10*3/uL (ref 150–440)
RBC: 4.44 10*6/uL (ref 3.80–5.20)
RDW: 12.6 % (ref 11.5–14.5)
WBC: 11.1 10*3/uL — ABNORMAL HIGH (ref 3.6–11.0)

## 2013-06-16 LAB — DRUG SCREEN, URINE

## 2013-06-16 LAB — SALICYLATE LEVEL

## 2013-06-16 LAB — MAGNESIUM: MAGNESIUM: 1.4 mg/dL — AB

## 2013-06-16 LAB — ACETAMINOPHEN LEVEL: Acetaminophen: <2 ug/mL

## 2013-06-17 LAB — POTASSIUM: POTASSIUM: 3.6 mmol/L (ref 3.5–5.1)

## 2013-06-17 LAB — SGOT (AST)(ARMC): SGOT(AST): 48 U/L — ABNORMAL HIGH (ref 15–37)

## 2013-06-17 LAB — ALT: SGPT (ALT): 29 U/L (ref 12–78)

## 2013-06-17 LAB — MAGNESIUM: MAGNESIUM: 1.7 mg/dL — AB

## 2013-06-18 ENCOUNTER — Inpatient Hospital Stay: Payer: Self-pay | Admitting: Psychiatry

## 2013-06-19 ENCOUNTER — Telehealth: Payer: Self-pay | Admitting: Family Medicine

## 2013-06-19 NOTE — Telephone Encounter (Signed)
Please call pt about getting hospital f/u OV set.  Thanks.

## 2013-06-19 NOTE — Telephone Encounter (Signed)
Left detailed message on voicemail.  

## 2013-07-03 ENCOUNTER — Other Ambulatory Visit: Payer: Self-pay | Admitting: Family Medicine

## 2013-07-03 DIAGNOSIS — R911 Solitary pulmonary nodule: Secondary | ICD-10-CM

## 2013-07-03 NOTE — Progress Notes (Signed)
Call pt.  Due for repeat CT scan in 4/15. Ordered.  Thanks.

## 2013-07-04 NOTE — Progress Notes (Signed)
Patient advised.

## 2013-07-11 ENCOUNTER — Ambulatory Visit: Payer: Self-pay | Admitting: Family Medicine

## 2013-07-12 ENCOUNTER — Encounter: Payer: Self-pay | Admitting: Family Medicine

## 2013-07-18 ENCOUNTER — Ambulatory Visit (INDEPENDENT_AMBULATORY_CARE_PROVIDER_SITE_OTHER): Payer: Self-pay | Admitting: Family Medicine

## 2013-07-18 ENCOUNTER — Encounter: Payer: Self-pay | Admitting: Family Medicine

## 2013-07-18 VITALS — BP 110/78 | HR 64 | Temp 98.0°F | Wt 158.5 lb

## 2013-07-18 DIAGNOSIS — M25519 Pain in unspecified shoulder: Secondary | ICD-10-CM

## 2013-07-18 DIAGNOSIS — E538 Deficiency of other specified B group vitamins: Secondary | ICD-10-CM

## 2013-07-18 DIAGNOSIS — F1020 Alcohol dependence, uncomplicated: Secondary | ICD-10-CM

## 2013-07-18 DIAGNOSIS — R918 Other nonspecific abnormal finding of lung field: Secondary | ICD-10-CM

## 2013-07-18 MED ORDER — METOPROLOL TARTRATE 50 MG PO TABS: 50.0000 mg | ORAL_TABLET | Freq: Two times a day (BID) | ORAL | Status: DC

## 2013-07-18 MED ORDER — PANTOPRAZOLE SODIUM 40 MG PO TBEC: 40.0000 mg | DELAYED_RELEASE_TABLET | Freq: Every day | ORAL | Status: DC

## 2013-07-18 MED ORDER — CYANOCOBALAMIN 1000 MCG/ML IJ SOLN
1000.0000 ug | Freq: Once | INTRAMUSCULAR | Status: AC
  Administered 2013-07-18: 1000 ug via INTRAMUSCULAR

## 2013-07-18 NOTE — Progress Notes (Signed)
Pre visit review using our clinic review tool, if applicable. No additional management support is needed unless otherwise documented below in the visit note.  Pulmonary nodule f/u CT d/w pt.  No f/u needed now.  Reassuring findings.  Pt understood.  Seeing Dr. Mervyn SkeetersA with [facility], in counseling.  She wasn't doing well on prozac prev, then started drinking.  Dialed 911 and [facility] to [facility].  Was inpatient at [facility] for 10 days.  In outpatient counseling, mult times a week, in the meantime.  Going to AA, everyday except for last night.  She wanted to get a second opinion for her psych care; she has had trouble getting any response from her current MD.   She drank last night.  Discussed.  She is planning on going to a meeting tonight.  ADE from [place] last night, as expected, GI upset.  Resolved now. No fevers.  No blood in stool.  No other etoh at home now.  Has a sponsor, usually in contact.   L arm pain with abduction. Pain reaching for objects.  PROM is wnl, but AROM is painful.  Pain with external rotation.  No known injury.  No arm drop.  Grip is wnl.   Meds, vitals, and allergies reviewed.   ROS: See HPI.  Otherwise, noncontributory.  nad ncat Neck supple, no LA L shoulder with pain on ext rotation and + impingement.  + scap assist with dec in pain Distally NV intact rrr ctab abd soft, not ttp Ext w/o edema Affect wnl, she was remorseful about drinking last night.

## 2013-07-18 NOTE — Patient Instructions (Signed)
Go see Abigail [name] on the way out, especially about seeing a different psych clinic. She'll call about the ortho referral.  Arm swings in the meantime to keep your shoulder from getting stiff.  Take care.  Get to the meeting tonight.

## 2013-07-19 ENCOUNTER — Telehealth: Payer: Self-pay | Admitting: Family Medicine

## 2013-07-19 DIAGNOSIS — M25519 Pain in unspecified shoulder: Secondary | ICD-10-CM | POA: Insufficient documentation

## 2013-07-19 NOTE — Telephone Encounter (Signed)
FYI, Patient has no health insurance. Have given the patient the information to [facility] in [place]. She must go in person to ask for an appt with a Psychiatrist. Also, for Ortho, she has a balance at [facility] and she will call their billing Dept to ask for payment plan and then they will give her an appt. Have asked the patient to call me back with an update.

## 2013-07-19 NOTE — Assessment & Plan Note (Signed)
Injected today

## 2013-07-19 NOTE — Telephone Encounter (Signed)
Noted, thanks!

## 2013-07-19 NOTE — Assessment & Plan Note (Signed)
Pulmonary nodule f/u CT d/w pt.  No f/u needed now.  Reassuring findings.  Pt understood.

## 2013-07-19 NOTE — Assessment & Plan Note (Signed)
Refer for second opinion. She has plans to go to meeting tonight, will meet with sponsor.  Okay for outpatient f/u.  No SI/Hi.

## 2013-07-19 NOTE — Assessment & Plan Note (Signed)
Refer to ortho. Anatomy d/w pt.  Pendulum exercises for now for ROM.

## 2013-07-20 ENCOUNTER — Ambulatory Visit: Payer: Self-pay | Admitting: Family Medicine

## 2013-07-23 ENCOUNTER — Telehealth: Payer: Self-pay | Admitting: Family Medicine

## 2013-07-23 NOTE — Telephone Encounter (Signed)
Please call pt and see if she has had fu with psych in the meantime. She was trying to get in with a different psych clinic.  Thanks .

## 2013-07-24 NOTE — Telephone Encounter (Signed)
Left detailed message on voicemail.  

## 2013-07-25 NOTE — Telephone Encounter (Signed)
Left detailed message on voicemail.  

## 2013-07-25 NOTE — Telephone Encounter (Signed)
Patient says she is working with [facility] now and Dr. Mervyn SkeetersA.  She says she is waiting for him to do the Rx's and if that doesn't come through, she has the information to "go the other route."

## 2013-07-25 NOTE — Telephone Encounter (Signed)
Noted, thanks!

## 2013-12-25 ENCOUNTER — Other Ambulatory Visit: Payer: Self-pay | Admitting: Family Medicine

## 2013-12-25 NOTE — Telephone Encounter (Signed)
Received refill request electronically from pharmacy. Last office visit 07/18/13, last refill 02/16/13 #50/1 refill. Is it okay to refill medication?

## 2013-12-26 NOTE — Telephone Encounter (Signed)
Sent. Thanks.   

## 2014-02-02 ENCOUNTER — Other Ambulatory Visit: Payer: Self-pay | Admitting: Family Medicine

## 2014-05-27 ENCOUNTER — Other Ambulatory Visit: Payer: Self-pay | Admitting: Family Medicine

## 2014-05-27 DIAGNOSIS — E538 Deficiency of other specified B group vitamins: Secondary | ICD-10-CM

## 2014-05-27 DIAGNOSIS — I10 Essential (primary) hypertension: Secondary | ICD-10-CM

## 2014-05-28 ENCOUNTER — Other Ambulatory Visit (INDEPENDENT_AMBULATORY_CARE_PROVIDER_SITE_OTHER): Payer: BLUE CROSS/BLUE SHIELD

## 2014-05-28 DIAGNOSIS — E538 Deficiency of other specified B group vitamins: Secondary | ICD-10-CM

## 2014-05-28 DIAGNOSIS — I10 Essential (primary) hypertension: Secondary | ICD-10-CM

## 2014-05-28 LAB — COMPREHENSIVE METABOLIC PANEL
ALK PHOS: 69 U/L (ref 39–117)
ALT: 18 U/L (ref 0–35)
AST: 31 U/L (ref 0–37)
Albumin: 4.3 g/dL (ref 3.5–5.2)
BUN: 14 mg/dL (ref 6–23)
CALCIUM: 9.8 mg/dL (ref 8.4–10.5)
CHLORIDE: 103 meq/L (ref 96–112)
CO2: 24 mEq/L (ref 19–32)
Creatinine, Ser: 0.7 mg/dL (ref 0.40–1.20)
GFR: 93.67 mL/min (ref >60.00)
Glucose, Bld: 106 mg/dL — ABNORMAL HIGH (ref 70–99)
Potassium: 4.2 mEq/L (ref 3.5–5.1)
Sodium: 141 mEq/L (ref 135–145)
TOTAL PROTEIN: 7.8 g/dL (ref 6.0–8.3)
Total Bilirubin: 1.8 mg/dL — ABNORMAL HIGH (ref 0.2–1.2)

## 2014-05-28 LAB — LIPID PANEL
CHOL/HDL RATIO: 2
Cholesterol: 208 mg/dL — ABNORMAL HIGH (ref 0–200)
HDL: 105.3 mg/dL (ref >39.00)
LDL CALC: 67 mg/dL (ref 0–99)
NonHDL: 102.7
Triglycerides: 177 mg/dL — ABNORMAL HIGH (ref 0.0–149.0)
VLDL: 35.4 mg/dL (ref 0.0–40.0)

## 2014-05-28 LAB — VITAMIN B12: Vitamin B-12: 210 pg/mL — ABNORMAL LOW (ref 211–911)

## 2014-06-01 ENCOUNTER — Encounter: Payer: Self-pay | Admitting: Family Medicine

## 2014-06-04 ENCOUNTER — Ambulatory Visit (INDEPENDENT_AMBULATORY_CARE_PROVIDER_SITE_OTHER): Payer: BLUE CROSS/BLUE SHIELD | Admitting: Family Medicine

## 2014-06-04 ENCOUNTER — Encounter: Payer: Self-pay | Admitting: Family Medicine

## 2014-06-04 ENCOUNTER — Other Ambulatory Visit (HOSPITAL_COMMUNITY)
Admission: RE | Admit: 2014-06-04 | Discharge: 2014-06-04 | Disposition: A | Discharge status: Home / Self Care | Payer: BLUE CROSS/BLUE SHIELD | Source: Ambulatory Visit | Attending: Family Medicine | Admitting: Family Medicine

## 2014-06-04 VITALS — BP 152/92 | HR 67 | Temp 98.9°F | Ht 61.0 in | Wt 172.5 lb

## 2014-06-04 DIAGNOSIS — F1010 Alcohol abuse, uncomplicated: Secondary | ICD-10-CM

## 2014-06-04 DIAGNOSIS — F1011 Alcohol abuse, in remission: Secondary | ICD-10-CM

## 2014-06-04 DIAGNOSIS — E538 Deficiency of other specified B group vitamins: Secondary | ICD-10-CM

## 2014-06-04 DIAGNOSIS — Z1211 Encounter for screening for malignant neoplasm of colon: Secondary | ICD-10-CM

## 2014-06-04 DIAGNOSIS — Z1239 Encounter for other screening for malignant neoplasm of breast: Secondary | ICD-10-CM

## 2014-06-04 DIAGNOSIS — Z23 Encounter for immunization: Secondary | ICD-10-CM

## 2014-06-04 DIAGNOSIS — I10 Essential (primary) hypertension: Secondary | ICD-10-CM

## 2014-06-04 DIAGNOSIS — Z1151 Encounter for screening for human papillomavirus (HPV): Secondary | ICD-10-CM | POA: Diagnosis present

## 2014-06-04 DIAGNOSIS — Z0000 Encounter for general adult medical examination without abnormal findings: Secondary | ICD-10-CM

## 2014-06-04 DIAGNOSIS — Z7189 Other specified counseling: Secondary | ICD-10-CM

## 2014-06-04 DIAGNOSIS — Z9189 Other specified personal risk factors, not elsewhere classified: Secondary | ICD-10-CM

## 2014-06-04 DIAGNOSIS — Z01419 Encounter for gynecological examination (general) (routine) without abnormal findings: Secondary | ICD-10-CM | POA: Insufficient documentation

## 2014-06-04 MED ORDER — PROMETHAZINE HCL 25 MG PO TABS: ORAL_TABLET | ORAL | Status: DC

## 2014-06-04 MED ORDER — CYANOCOBALAMIN 1000 MCG/ML IJ SOLN
1000.0000 ug | Freq: Once | INTRAMUSCULAR | Status: AC
  Administered 2014-06-04: 1000 ug via INTRAMUSCULAR

## 2014-06-04 MED ORDER — BUPROPION HCL ER (SR) 150 MG PO TB12: 150.0000 mg | ORAL_TABLET | Freq: Every day | ORAL | Status: DC

## 2014-06-04 MED ORDER — PANTOPRAZOLE SODIUM 40 MG PO TBEC: 40.0000 mg | DELAYED_RELEASE_TABLET | Freq: Every day | ORAL | Status: DC

## 2014-06-04 MED ORDER — METOPROLOL TARTRATE 50 MG PO TABS: 50.0000 mg | ORAL_TABLET | Freq: Two times a day (BID) | ORAL | Status: DC

## 2014-06-04 NOTE — Patient Instructions (Addendum)
Go to the lab on the way out.  We'll contact you with your lab report (pap and stool cards). Abigail [name] will call about your referral. Recheck B12 in about 2 months, sooner if you note changes.  See if the injection today helps with the cramps.   Take care. Glad to see you.

## 2014-06-04 NOTE — Progress Notes (Signed)
Pre visit review using our clinic review tool, if applicable. No additional management support is needed unless otherwise documented below in the visit note.  CPE- See plan.  Routine anticipatory guidance given to patient.  See health maintenance. Tetanus 2000, updated today.   Flu encouraged for fall.  PNA during hospitalization 2014 Shingles shot not due.   D/w patient [id number]:options for colon cancer screening, including IFOB vs. colonoscopy.  Risks and benefits of both were discussed and patient voiced understanding.  Pt elects for: IFOB.   Breast cancer screening.   Due.  D/w pt.   DXA not due.   Living will d/w pt.  Would have her daughter designated if patient were incapacitated.  Due for Pap.  No longer having periods, none since 2012.    She has had frequent muscle cramps, hands torso legs and feet.  K was normal recently.  No clear cause known.    Hypertension:    Using medication without problems or lightheadedness: yes Chest pain with exertion:no Edema:no Short of breath:no Was running low on BP meds and had to dec her dose in the meantime.   H/o etoh use, now rare.  H/o pancreatitis noted, d/w pt.  Recently with father ill, higher stress time.  D/w pt about restarting wellbutrin and she agrees that would be reasonable.   Low B12.  H/o gastric bypass.  Labs d/w pt.  See plan.   PMH and SH reviewed  Meds, vitals, and allergies reviewed.   ROS: See HPI.  Otherwise negative.    GEN: nad, alert and oriented HEENT: mucous membranes moist NECK: supple w/o LA CV: rrr. PULM: ctab, no inc wob ABD: soft, +bs EXT: no edema SKIN: no acute rash Normal introitus for age, no external lesions, no vaginal discharge, mucosa pink and moist, no vaginal or cervical lesions, no vaginal atrophy, no friaility or hemorrhage, normal uterus size and position, no adnexal masses or tenderness.  Chaperoned exam.

## 2014-06-05 ENCOUNTER — Telehealth: Payer: Self-pay | Admitting: Family Medicine

## 2014-06-05 DIAGNOSIS — Z0000 Encounter for general adult medical examination without abnormal findings: Secondary | ICD-10-CM | POA: Insufficient documentation

## 2014-06-05 DIAGNOSIS — Z9189 Other specified personal risk factors, not elsewhere classified: Secondary | ICD-10-CM | POA: Insufficient documentation

## 2014-06-05 DIAGNOSIS — Z7189 Other specified counseling: Secondary | ICD-10-CM | POA: Insufficient documentation

## 2014-06-05 LAB — CYTOLOGY - PAP

## 2014-06-05 NOTE — Assessment & Plan Note (Signed)
Routine anticipatory guidance given to patient.  See health maintenance. Tetanus 2000, updated today.   Flu encouraged for fall.  PNA during hospitalization 2014 Shingles shot not due.   D/w patient GN:[id number]:options for colon cancer screening, including IFOB vs. colonoscopy.  Risks and benefits of both were discussed and patient voiced understanding.  Pt elects for: IFOB.   Breast cancer screening.   Due.  D/w pt.   DXA not due.   Living will d/w pt.  Would have her daughter designated if patient were incapacitated.  Due for Pap.  No longer having periods, none since 2012.

## 2014-06-05 NOTE — Assessment & Plan Note (Signed)
Caring for her father.  dw pt, restart wellbutrin and call back as needed. Still okay for outpatient fu.

## 2014-06-05 NOTE — Assessment & Plan Note (Signed)
I don't know how much she can safely drink, d/w pt.

## 2014-06-05 NOTE — Assessment & Plan Note (Addendum)
She'll go back to routine dose of BB and notify me if not controlled.  She had to "stretch" her medicine recently.  She'll notify me if the cramps continue.

## 2014-06-05 NOTE — Assessment & Plan Note (Signed)
Dosed IM today, recheck in about 2 months to gauge how often she'll need injection.  She agrees.

## 2014-06-05 NOTE — Telephone Encounter (Signed)
emmi emailed °

## 2014-06-07 ENCOUNTER — Telehealth: Payer: Self-pay

## 2014-06-07 NOTE — Telephone Encounter (Signed)
Patient aware of normal pap.

## 2014-06-07 NOTE — Telephone Encounter (Signed)
-----   Message from Joaquim NamGraham S Duncan, MD sent at 06/07/2014  8:41 AM EST ----- Notify pt.  Pap normal.  Thanks.

## 2014-07-04 ENCOUNTER — Other Ambulatory Visit: Payer: Self-pay | Admitting: Family Medicine

## 2014-07-24 NOTE — Consult Note (Signed)
PATIENT NAME:  Abigail [name], Abigail [name] MR#:  [id number] DATE OF BIRTH:  September 27, 1960  DATE OF CONSULTATION:  03/12/2012  REFERRING PHYSICIAN:   CONSULTING PHYSICIAN:  Carmie End, MD  PRIMARY CARE PHYSICIAN: Dr. Para March, [facility]  ADMITTING PHYSICIAN: [facility]   CHIEF COMPLAINT: Nausea, possible pancreatitis.   BRIEF HISTORY: Abigail [name] is a 54 year old woman admitted through the Emergency Room with a several-day history of profound nausea and vomiting. She had marked nausea which progressed to significant vomiting and with the vomiting she began to have abdominal pain. She presented to the Emergency Room for further evaluation on the advice of her primary care doctor to avoid dehydration. Work-up in the Emergency Room revealed an elevated lipase suggestive of possible pancreatitis. She was admitted to the hospital for further evaluation. Since he has been in the hospital her lipase has come down but remains elevated. Her bilirubin was unremarkable. Her electrolytes were slightly depressed with sodium 134 and potassium 3.3. White blood cell count was not elevated. Platelet count was normal. Albumin was normal at 4.2.   She had a gallbladder ultrasound which revealed possible sludge, possible stones without evidence of acute cholecystitis. She has history of gastric bypass using the laparoscopic approach performed in 2006. She has also had an appendectomy. She has had intermittent nausea since the bypass. She is unable to eat very much at a time and has had problems with significant nausea post prandially.   CURRENT MEDICATIONS:  1. Hydrochlorothiazide.  2. Multivitamins. 3. Calcium.  4. She takes ibuprofen for pain as needed.   ALLERGIES, FAMILY HISTORY AND SOCIAL HISTORY: Outlined in the chart. She does have a fairly significant alcohol history but does not have a history of alcohol abuse. There is a question of possible alcohol withdrawal but she denies any significant alcohol use recently.    REVIEW OF SYSTEMS: A 10 point review of systems was performed with the patient and is largely unremarkable with the exception of symptoms noted above.   PHYSICAL EXAMINATION:  GENERAL: She is an alert, pleasant woman, sitting in bed at rest in no obvious distress surrounded by her family.   VITAL SIGNS: Blood pressure 165/92, heart rate 76 and regular, oxygen saturation 98% on room air.   HEENT: No scleral icterus. No pupillary abnormalities. No facial deformities.   NECK: Supple, nontender without adenopathy and she has midline trachea.   CHEST: Clear without adventitious sounds and no evidence of any pulmonary excursion problems.   CARDIAC: No murmurs or gallops. She seems to be in normal sinus rhythm.   ABDOMEN: Her abdomen is very soft with no generalized tenderness, no point tenderness, no rebound, no guarding. She denies any abdominal symptoms at the present time. She has active bowel sounds. No hernias are noted.   LOWER EXTREMITIES: Full range of motion, no deformities.   PSYCHIATRIC: Normal orientation and normal affect.   IMPRESSION: She is about to have CT scan. Lipase remains elevated at 700. It went down as low as 500 yesterday but is back in the mid 700s today. Bilirubin remains normal. Of note is the fact her platelet count has fallen from 210 to just over 100,000. Platelet count was not checked today. She does not have any history of problems with platelet count but she is on heparin prophylactically.   She does appear to have pancreatitis. In this situation status post laparoscopic gastric bypass she probably does have some biliary tract disease as the source of her problem. She does not give  a strong alcohol history and denies that alcohol could be the source of her current problem. Whatever the etiology the pancreatitis is being treated appropriately and I would not recommend any surgical intervention at this point. CT scan is a good option to further follow the  progress. I would continue to support her and would not recommend surgery until her lipase returns to normal. Surgery will not solve the current problem or reduce her symptoms and is really only indicated to prevent another episode of the same problem. This plan was outlined to the family. We are awaiting the results of the CT scan.  ____________________________ Carmie Endalph L. Ely III, MD rle:cms D: 03/12/2012 17:29:06 ET T: 03/13/2012 10:27:38 ET JOB#: [id number]  cc: Carmie Endalph L. Ely III, MD, <Dictator> Dwana CurdGraham S. Para Marchuncan, MD  Quentin OreALPH L ELY MD ELECTRONICALLY SIGNED 03/18/2012 16:59

## 2014-07-24 NOTE — Consult Note (Signed)
PATIENT NAME:  Abigail [name], Brice G MR#:  [id number] DATE OF BIRTH:  22-Aug-1960  DATE OF CONSULTATION:  03/13/2012  REFERRING PHYSICIAN:   CONSULTING PHYSICIAN:  Scot Junobert T. Jeanita Carneiro, MD  HISTORY OF PRESENT ILLNESS:  The patient is a 54 year old white female who was admitted to the hospital because of abdominal pain and vomiting. She has had about a week's worth of abdominal pain, nausea, and vomiting. She was found to have a mildly elevated lipase and was admitted for possible pancreatitis. She had an ultrasound that showed sludge in the gallbladder and a CT scan that showed a small stone.  No pancreatitis was seen on the CT scan.   The patient says that when she would eat almost any food for the last week within 15 minutes she would have abdominal pain and vomiting. She saw a little bit of brown material once with the vomitus. She takes a lot of ibuprofen, at least two 800-mg tablets once or twice a day.   The patient has had a gastric bypass surgery for weight loss. She lost down to a size 4 eventually and was given a higher-calorie diet and gained some weight back.   FAMILY HISTORY: A parent and sister both had kidney cancer. Mother with breast cancer at age 52.   HABITS: Does not smoke. Drinks 1 to 2 mixed drinks a night.   CURRENT MEDICATIONS: Hydrochlorothiazide, calcium, and ibuprofen.   PHYSICAL EXAMINATION:  GENERAL: White female in no acute distress.   VITAL SIGNS: Blood pressure 134/90, pulse 68, temperature 97.9.   HEENT: Sclerae anicteric. Conjunctivae negative. Tongue negative.   CHEST: Clear.   HEART: No murmurs, gallops, clicks, or rubs.   ABDOMEN: Mild epigastric tenderness to palpation. Minimal lower abdominal tenderness. No hepatosplenomegaly. No masses.   LABORATORY DATA: Lipase 783, total protein 6.1, albumin 2.8, total bilirubin 0.5, direct bilirubin 0.2, alkaline phosphatase 76, SGOT 56, SGPT 50. White count 3.4, hemoglobin 14.5, platelet count 124, glucose 82, BUN 4,  creatinine 0.54, sodium 142, potassium 3.2, chloride 110.   ASSESSMENT: The patient has had gastric bypass and these patients are often prone to have more inflammation and disease when taking nonsteroidals and she has been taking a lot of nonsteroidals. Because of her vomiting and abdominal pain and the use of nonsteroidals we are going to do an upper endoscopy tomorrow. Occasionally mildly elevated lipase can be seen with ulcer disease or other GI tract disorders and is not solely related to pancreatitis.  The procedure was discussed with the patient and her family.   The patient had a complication when she had her gastric bypass; apparently one of the arteries, possibly even the aorta, was nicked when they removed an instrument and she had to go back to surgery. I explained to the patient the endoscopic procedure and that we will be likely using  propofol.    ____________________________ Scot Junobert T. Chord Takahashi, MD rte:bjt D: 03/13/2012 15:36:20 ET T: 03/13/2012 15:46:21 ET JOB#: [id number]  cc: Scot Junobert T. Pati Thinnes, MD, <Dictator> Scot JunOBERT T Leveda Kendrix MD ELECTRONICALLY SIGNED 03/19/2012 11:55

## 2014-07-24 NOTE — H&P (Signed)
PATIENT NAME:  Damaris HippoKING, Travonna G MR#:  [id number] DATE OF BIRTH:  [birth year]  DATE OF ADMISSION:  03/09/2012  PRIMARY CARE PHYSICIAN: Dr. Para [facility]uncan in [facility]   CHIEF COMPLAINT: Vomiting for five days.  HISTORY OF PRESENT ILLNESS: This is a 54 year old female with past medical history of hypertension, lupus, alcohol abuse, gastric bypass surgery presented to the Emergency Room after being advised by her primary physician. Since last five days she has vomiting every time she tried to eat something and now that is followed by epigastric pain which is dull, constant, aggravated by vomiting, not relieved by anything. Finally she called her primary care doctor today to ask advice and he told that she is getting dehydrated by not eating and drinking anything, go to the Emergency Room and she decided to come over here. ER physician did the routine blood tests. They found lipase is elevated. They did the sonogram right upper quadrant, did not show any gallstone so she is being admitted for acute pancreatitis alcohol induced.   REVIEW OF SYSTEMS: CONSTITUTIONAL: Negative for fever, fatigue, weakness, . She said that she lost 8 to 10 pounds in last five days. EYES: Denies any blurring or double lesion, any inflammation or discharge from the eyes. ENT: Denies any tinnitus, ear pain, hearing loss, or discharge. RESPIRATORY: Denies any cough, wheezing, hemoptysis or dyspnea. CARDIOVASCULAR: Denies chest pain, orthopnea, edema, or palpitations. GASTROINTESTINAL: She has nausea and vomiting and abdominal pain. Denies any diarrhea. She frequency of the stool is decreased because she is not able to eat anything since five days. GENITOURINARY: She says that she has decreased frequency of urination and urine is dark because of dehydration. SKIN: Denies any rashes. JOINTS: Denies any swelling or pain on the joints. NEUROLOGICAL: Denies any numbness, weakness, any tremors or headache. PSYCHIATRY: Denies any  anxiety, insomnia, or bipolar disorder history.   PAST MEDICAL HISTORY: Positive for hypertension and lupus.  PAST SURGICAL HISTORY:  1. Appendectomy.  2. Tonsillectomy.  3. Gastric bypass surgery 2005.   MEDICATION HISTORY:  1. Hydrochlorothiazide. 2. Potassium. 3. Ibuprofen as needed.  4. Benadryl as needed.  5. Multivitamin. 6. Calcium.  ALLERGIES: Allergic to ACE inhibitor.   FAMILY HISTORY: Positive for breast cancer in mother and sister and kidney cancer in father.   SOCIAL HISTORY: She denies smoking. Drinks alcohol socially. On further questioning, she says that which is almost every other day they have friends get together and they drink. Sometimes she also feels having withdrawal symptoms, having tremors and anxiety while not drinking and she has to drink to relieve those symptoms. She denies any drug use. 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   PHYSICAL EXAMINATION:  VITAL SIGNS: Temperature 98.1, pulse rate 110, respirations 18, blood pressure 166/94, oxygen saturation 96 on room air.   GENERAL: Fully alert, oriented to time, place, and person, not in any acute distress.   HEENT: Head and neck without any trauma. Conjunctivae pink. Oral mucosa moist.   NECK: Supple. No JVD.   RESPIRATORY: Bilateral clear and equal air entry.   CARDIOVASCULAR: S1, S2 present, regular, tachycardia present. No murmur appreciated.   ABDOMEN: Soft, nontender. Bowel sounds present. Organomegaly not appreciated.   LEGS: No edema.   SKIN: No rashes.   JOINTS: Nontender, no swelling.   NEUROLOGICAL: Follows commands. Fully alert and oriented, tremors on outstretched hand. No rigidity present.   PSYCHIATRIC: Does not appear in any acute psychiatric illness.  LABORATORY, DIAGNOSTIC AND RADIOLOGICAL  DATA: Glucose 132, BUN 5, creatinine 0.54, sodium 134, potassium 3.3, chloride 99, CO2 920, LDH 301, lipase 1042, total protein 8.8, albumin 4.2, bilirubin 0.9, alkaline  phosphatase 103, SGOT 162, SGPT 120, hemoglobin 16.4, WBC 4.9, platelet count 206. Urinalysis grossly negative. Sonogram abdomen shows gallbladder sludge versus nonshadowing cholelithiasis without sonographic evidence of acute cholecystitis.   ASSESSMENT: 54 year old alcoholic patient came with vomiting. 1. Acute pancreatitis. IV fluid, IV PPI, n.p.o. except medications. Pain management with morphine. Zofran for nausea. Sonogram is negative for any gallstone. Will get lipid panel tomorrow morning.  2. Alcohol withdrawal. Tremors at present time, tachycardic and hypertensive also. Will give Ativan as CIWA protocol, thiamine and folic acid and IV fluids.  3. Deep vein thrombosis prophylaxis with heparin sub-Q.  4. CODE STATUS: FULL CODE.   TOTAL TIME SPENT: 55 minutes.   ____________________________ Hope Pigeon Elisabeth Pigeon, MD vgv:cms D: 03/09/2012 19:09:52 ET T: 03/10/2012 05:03:10 ET JOB#: [id number]  cc: Hope Pigeon. Elisabeth Pigeon, MD, <Dictator> Dwana Curd. Para March, MD  Altamese Dilling MD ELECTRONICALLY SIGNED 03/21/2012 22:58

## 2014-07-24 NOTE — Consult Note (Signed)
Pt uses NSAID meds frequently, has pain and vomiting within 10-15 min of eating for the last week.  Suggests ulcer disease/duodenitis.  Plan to do EGD tomorrow.  Discussed with her and family.  Electronic Signatures: Scot JunElliott, Mahamud Metts T (MD)  (Signed on 08-Dec-13 15:36)  Authored  Last Updated: 08-Dec-13 15:36 by Scot JunElliott, Gotti Alwin T (MD)

## 2014-07-24 NOTE — Consult Note (Signed)
Pt EGD showed inflammation near anastamosis.  Home on carafate and very soft diet and avoid ibuprofen.  Electronic Signatures: Scot JunElliott, Tani Virgo T (MD)  (Signed on 09-Dec-13 18:23)  Authored  Last Updated: 09-Dec-13 18:23 by Scot JunElliott, Geniya Fulgham T (MD)

## 2014-07-27 NOTE — Consult Note (Signed)
PATIENT NAME:  Abigail [name], RADZIEWICZ MR#:  [id number] DATE OF BIRTH:  1960-06-04  DATE OF CONSULTATION:  09/05/2012  REFERRING PHYSICIAN:  Dr. Manuella Ghazi CONSULTING PHYSICIAN:  Gaylyn Cheers, MD / Corky Sox. Shontell Prosser, PA-C  REASON FOR CONSULTATION:  Acute pancreatitis.   HISTORY OF PRESENT ILLNESS: This is a pleasant 54 year old female who initially presented to the Emergency Department with sudden onset abdominal pain in the epigastric region with accompanying nausea and vomiting that began over the weekend. She does admit that she was binge drinking over the weekend and suspects that this may be related. Upon further work-up her lipase was greater than 3000, bilirubin was 1.6, ALT was 99 and AST was 293. Alk phos was within normal limits. Ultrasound of the abdomen was suggestive of acute pancreatitis changes. Of note, back in December of 2013 she had acute pancreatitis which was thought to be biliary related and she is now status post cholecystectomy. Her gallbladder was removed approximately 6 months ago. Currently on speaking with the patient she is still experiencing abdominal pain with nausea, but the vomiting has resolved. No black or bloody stools. No diarrhea or constipation. No fever or chills. No chest pain or shortness of breath. She does state that she really would like to stop drinking, but it is easier said than done. No other current concerns or complaints.   ALLERGIES: LISINOPRIL, REMERON, LATEX and STEROIDS.   PAST MEDICAL HISTORY: Hypertension, recurrent pancreatitis, lupus and a hiatal hernia.   SOCIAL HISTORY: The patient does report excessive alcohol intake and does express interest in trying to stop. She denies any tobacco or illicit drug use.   PAST SURGICAL HISTORY: Tonsillectomy, gastric bypass surgery 10 years ago and a cholecystectomy 6 months ago.   FAMILY HISTORY: Sister and father had had kidney cancer. Mother has had breast cancer. No known GI malignancy, colon polyps or IBD. No known  family history of liver cirrhosis.   REVIEW OF SYSTEMS:  A 10 system review was obtained on the patient. All pertinent positives are mentioned above and otherwise negative.   PHYSICAL EXAMINATION:  VITAL SIGNS: Blood pressure 123/76, heart rate 76, respirations 18, temp 98.2, bedside pulse ox 94%.  GENERAL: This is a pleasant 54 year old female resting quietly and comfortably in the exam room, in no acute distress, alert and oriented x 3.  HEAD: Atraumatic, normocephalic.  NECK: Supple. No lymphadenopathy noted.  HEENT: Sclerae anicteric. Mucous membranes moist.  LUNGS: Respirations are even and unlabored, clear to auscultation in bilateral anterior lung fields.  HEART: Regular rate and rhythm. S1 and S2 noted.  ABDOMEN: Soft. There is tenderness to palpation in the epigastric region that is quite significant. Normoactive bowel sounds noted in all 4 quadrants. No masses or hernias or organomegaly appreciated. No guarding or rebound.  RECTAL: Deferred.  PSYCHIATRIC: Appropriate mood and affect.   ASSESSMENT: 1.  Acute pancreatitis, may be secondary to recent binge drinking over the weekend versus biliary pancreatitis. The patient is postop cholecystectomy about 6 months and does have some transaminitis as well as an elevated bilirubin.  2.  Abdominal pain.  3.  Nausea and vomiting.   PLAN: I have discussed this patient's case in detail with Dr. Gaylyn Cheers who is involved in the development of the patient's plan of care. At this time, we do recommend returning the patient to n.p.o. status. She is still in quite a bit of pain and when she tried to have a popsicle this morning it made the pain worse. Therefore, we  do recommend that she remain n.p.o. for now and continue IV hydration. Because of her elevated bilirubin we will check a MRCP to clear the biliary tree as an alternative etiology. We did also have a long discussion regarding abstinence from alcohol and the damaging effects this can  have by continuing to drink. She verbalized understanding and did express interest in cutting back. We do recommend rechecking LFTs in the morning. Continue symptomatic management with pain control and antiemetics. Further recommendations pending above and per clinical course. The above plan was discussed with the patient who is in agreement and all questions were answered.   Thank you so much for this consultation and for allowing [place] to participate in the patient's plan of care.  These services are provided by Corky Sox. Zettie Pho, PA-C under collaborative agreement with Manya Silvas, MD.  ____________________________ Corky Sox. Lyriq Jarchow, PA-C kme:sb D: 09/05/2012 12:55:23 ET T: 09/05/2012 13:21:25 ET JOB#: [id number]  cc: Corky Sox. Mirtha Jain, PA-C, <Dictator> [place] PA ELECTRONICALLY SIGNED 09/05/2012 15:17

## 2014-07-27 NOTE — Discharge Summary (Signed)
PATIENT NAME:  Abigail [name], Abigail [name] MR#:  [id number] DATE OF BIRTH:  04-18-1960  DATE OF ADMISSION:  09/05/2012 DATE OF DISCHARGE:  09/09/2012  DISCHARGE DIAGNOSES: 1.  Acute-on-chronic pancreatitis, likely alcohol-induced, now tolerating diet (soft diet and pain is under much better control. 2.  Hypokalemia, repleted and resolved.  3. Thrombocytopenia, likely due to alcoholism.  4.  Severe alcoholism, recommended Alcoholic Anonymous as an outpatient.   SECONDARY DIAGNOSES: 1.  Hypertension.  2.  Recurrent pancreatitis.  3.  Lupus. 4.  Hiatal hernia.   CONSULTATIONS: 1.  GI: Dr. Mechele CollinElliott.  2.  Psychiatry: Dr. Toni Amendlapacs.   PROCEDURES/RADIOLOGY:  MRCP on 2nd of June showed no evidence of common bile duct dilatation or retained stone.   Abdominal ultrasound on 2nd of June showed sequelae of pancreatitis. No masses. Possible hepatic steatosis. The liver is prominent.   MAJOR LABORATORY PANEL: UA on admission was negative.   HISTORY AND SHORT HOSPITAL COURSE: The patient is a 54 year old female with the above-mentioned medical problems who was admitted for abdominal pain, nausea and vomiting thought to be secondary to acute-on-chronic pancreatitis. Please see Dr. Margaretmary [name]'s dictated history and physical for further details. The patient's lipase was greater than 3000 on admission. She was made n.p.o. GI consultation was obtained with Dr. Mechele CollinElliott, who recommended slowly advancing her diet to clear liquids and advance as tolerated.  The patient was evaluated by psychiatry, Dr. Toni Amendlapacs concerning alcohol dependence, who recommended outpatient Alcoholics Anonymous treatment. She was also counseled while in the hospital and was agreeable to follow up outpatient.   She was slowly improving, tolerating diet. Underwent MRCP as recommended by GI and was negative. On the 6th of June she was much better, close to her baseline, and was discharged home in stable condition.   On the date of discharge her vital signs  were as follows: Temperature 97.8, heart rate 63 per minute, respirations 20 per minute, blood pressure 154/94. She was saturating 96% on room air.   PERTINENT PHYSICAL EXAMINATION ON THE DATE OF DISCHARGE:  CARDIOVASCULAR: S1, S2 normal. No murmurs, rubs or gallop.  LUNGS: Clear to auscultation bilaterally. No wheezing, rales, rhonchi, or crepitation.  ABDOMEN: Soft, benign.  NEUROLOGIC: Nonfocal examination.   All other physical examination remained at baseline.   DISCHARGE MEDICATIONS: 1.  Benadryl 25 mg p.o. at bedtime.  2.  Metoprolol 50 mg p.o. b.i.d.  3.  Carafate 1 gram p.o. 3 times a day as needed.  4.  Acetaminophen/hydrocodone 325/5 mg, 1 tablet p.o. b.i.d. as needed, total of 7 days.  5.  Milk of magnesia 30 mL p.o. every 6 hours as needed.  6.  Protonix 40 mg p.o. b.i.d.  7.  Tramadol 50 mg p.o. 3 times a day for 7 days.   DISCHARGE DIET: Low sodium, low fat, low cholesterol, soft diet. Avoid any kind of greasy or fatty food.   Mechanical soft diet to be continued until Monday, the 9th of June, and then if no pain or nausea or vomiting then this can be advanced to a low fat diet.   DISCHARGE ACTIVITY: As tolerated.   DISCHARGE INSTRUCTIONS AND FOLLOWUP: The patient was instructed to follow up with her primary care physician, Dr. Crawford GivensGraham Duncan, in 1 to 2 weeks. She will need followup with Dr. Mechele CollinElliott from GI in 1 to 2 weeks.   Total time discharging this patient was 55 minutes.   ____________________________ Ellamae SiaVipul S. Sherryll BurgerShah, MD vss:dm D: 09/12/2012 10:15:44 ET T: 09/12/2012 10:25:54 ET JOB#: [id number]  cc: Tanveer Brammer S. Sherryll Burger, MD, <Dictator> Dwana Curd. Para March, MD Audery Amel, MD Scot Jun, MD Ellamae Sia [facility] MD ELECTRONICALLY SIGNED 09/12/2012 11:39

## 2014-07-27 NOTE — H&P (Signed)
PATIENT NAME:  Abigail [name], Abigail G MR#:  [id number] DATE OF BIRTH:  Dec 27, 1960  DATE OF ADMISSION:  09/05/2012  PRIMARY CARE PHYSICIAN: [facility].   REQUESTING PHYSICIAN: Dr. Dorothea Glassmanpaul Malinda    CHIEF COMPLAINT:  Abdominal pain, nausea and vomiting.   HISTORY OF PRESENT ILLNESS: The patient is a 54 year old female with a known history of hypertension, lupus and pancreatitis who is being admitted for acute on chronic pancreatitis.  The patient was having abdominal pain, nausea, vomiting since Friday, has been getting worse. She was diagnosed with hiatal hernia in December and her gallbladder was removed in January. She had been binge drinking over the weekend and contributes that to her pancreatitis and her symptoms Her pain is mainly in the upper abdomen. She denies any black or bloody stools or blood in the vomit. She denies any fever or any other symptoms.   PAST MEDICAL HISTORY: 1. Hypertension.  2. Recurrent pancreatitis.  3. Lupus.  4. Hiatal hernia.   SOCIAL HISTORY: She is a heavy alcohol drinker. She does show interest in trying to stop. Denies any tobacco or illicit drug use. 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 She had vodka over the weekend.   PAST SURGICAL HISTORY: 1. Tonsillectomy.  2. Gastric bypass surgery about 10 years ago. 3. Cholecystectomy 6 months ago.   ALLERGIES: LISINOPRIL AND ARIMIDEX AND STEROIDS.   FAMILY HISTORY: Father and sister have a kidney cancer. Mother had breast cancer. No history of liver cirrhosis in the family.     REVIEW OF SYSTEMS:    CONSTITUTIONAL: No fever, fatigue, weakness.   EYES: No blurred or double vision.   ENT: No tinnitus or ear pain.   RESPIRATORY: No cough, wheezing, hemoptysis.   CARDIOVASCULAR: No chest pain, orthopnea or edema.   GASTROINTESTINAL: Positive for nausea, vomiting and abdominal pain.   GENITOURINARY: No dysuria or hematuria.   ENDOCRINE: No polyuria  or nocturia.   HEMATOLOGY: No anemia or easy bruising.   SKIN: No rash or lesion.   MUSCULOSKELETAL: No arthritis or muscle cramp.   NEUROLOGIC: No tingling, numbness, or weakness.   PSYCHIATRIC: No history of anxiety or depression.   MEDICATIONS AT HOME: 1. Benadryl 25 mg p.o. at bedtime.  2. Carafate 1 gram p.o. 3 times a day as needed.  3. Metoprolol 50 mg p.o. b.i.d.  4. Protonix 40 mg p.o. daily.   PHYSICAL EXAMINATION:  VITAL SIGNS: Temperature 98.2, heart rate 82 per minute, respirations 20 per minute, blood pressure 152/80.  On night of admission, she was saturating 97%.   GENERAL:  The patient is a 54 year old female lying in the bed comfortably without any acute distress.   EYES: Pupils equal, round, reactive to light and accommodation. No scleral icterus. Extraocular muscles intact.   HEENT: Head atraumatic, normocephalic. Oropharynx and nasopharynx clear.   NECK: Supple. No jugular venous distention. No thyroid enlargement or tenderness.   LUNGS: Clear to auscultation bilaterally. No wheezing, rales, rhonchi or crepitation.   ABDOMEN: Soft, no distention. No organomegaly or masses. She has tenderness present in the upper epigastric region. No rebound or guarding or rigidity.   NEUROLOGIC: Cranial nerves II through XII intact. Muscle strength 5/5 in all extremities. Sensation intact.   PSYCHIATRIC: The patient is oriented to time, place and person x 3. She seems somewhat anxious.   SKIN: No obvious rash, lesion or ulcer.   LABORATORY, DIAGNOSTIC, AND RADIOLOGICAL DATA: Normal BMP, except sodium 132, lipase  of more than 3000.  LFTs showed AST 293, ALT 99, total bilirubin 1.6. Normal CBC, except platelet count of 141. Urinalysis was negative.   IMPRESSION AND PLAN: 1. Acute on chronic pancreatitis with nausea, vomiting and abdominal pain likely alcohol induced. We will consult gastrointestinal, obtain abdominal ultrasound, she may need MRCP to evaluate for possible  stones.  2. Will keep her n.p.o. for now and monitor her lipase and amylase. We will recheck those in the morning along with liver function test.  3. Hypertension: We will continue metoprolol. At this time, blood pressure seems stable.  4. Nausea, vomiting and abdominal pain likely from pancreatitis. We will provide symptomatic management at this time and provide morphine for pain control.  5. Thrombocytopenia likely due to alcoholism. We will monitor her platelet counts. No signs of bleeding at this time.  6. CODE STATUS: Full code.  7. Severe alcoholism. The patient is wanting to quit and stop it, we will refer her to Alcoholics Anonymous to start alcohol withdrawal protocol and consult psychiatry, as she is willing to talk with them.   Total time taking care of this patient: 55 minutes.   ____________________________ Ellamae Sia. Sherryll Burger, MD vss:rw D: 09/05/2012 16:23:08 ET T: 09/05/2012 17:09:03 ET JOB#: [id number]  cc: Kyesha Balla S. Sherryll Burger, MD, <Dictator> Dwana Curd. Para March, MD Patricia Pesa MD ELECTRONICALLY SIGNED 09/07/2012 11:59

## 2014-07-27 NOTE — Consult Note (Signed)
Pt CC: pancreatitis.  She has less abd pain, tol clear liq breakfast but got pain after lunch.  Advised to move slowly.  Lipase 700. no peritoneal signs,  She was advised to go to AA for 90 meetings in 90 days.  Should go home on some pain medicine and something for sleep.  Alcoholics often have difficulty sleeping due to not being under influence of alcohol.  I will be glad to see her a few weeks after discharge..  Electronic Signatures: Scot JunElliott, Minka Knight T (MD)  (Signed on 04-Jun-14 15:08)  Authored  Last Updated: 04-Jun-14 15:08 by Scot JunElliott, Leobardo Granlund T (MD)

## 2014-07-27 NOTE — Consult Note (Signed)
Pt CC pancreatitis.  Pt may be having some pain when try to advance diet that could indicate alcoholic gastritris as well as ulcer disease, in addition to her pancreatitis.  I ordered a contingent diet change to full liquids if she does not tolerate her [place] sandwich.  She can go home on a full liquid diet and advance slowly.  Should go home on PPI.  I think for the next week or two could go up on Norco to q4 hr prn.  I will be glad to see her in office in 1-2 weeks.  Electronic Signatures: Scot JunElliott, Robert T (MD)  (Signed on 05-Jun-14 18:53)  Authored  Last Updated: 05-Jun-14 18:53 by Scot JunElliott, Robert T (MD)

## 2014-07-27 NOTE — Discharge Summary (Signed)
PATIENT NAME:  Abigail [name], Licet G MR#:  [id number] DATE OF BIRTH:  Feb 16, 1961  DATE OF ADMISSION:  03/09/2012 DATE OF DISCHARGE:  03/14/2012  PRESENTING COMPLAINT: Abdominal pain.   DISCHARGE DIAGNOSES:  1. Acute NSAID-induced gastritis.  2. Acute mild pancreatitis suspected from gallstone versus alcohol use.  3. Hypertension.  4. Alcohol use.   CONDITION ON DISCHARGE: Fair.   MEDICATIONS:  1. Metoprolol 50 mg twice a day. 2. Protonix 40 mg daily.  3. Carafate as instructed per prescription.   DIET: Mechanical soft diet.   DISCHARGE FOLLOWUP: Follow up with Dr. Para Marchuncan, PCP, in 2 to 4 weeks, and Dr. Mechele CollinElliott as needed.   DISCHARGE LABS/RADIOLOGIC STUDIES: Amylase 66. Total bilirubin 0.6, direct 0.2, SGPT and SGOT are 53 and 75. Hemoglobin and hematocrit is 14.5 and 41.7 and white count is 3.7.   CT of the abdomen with contrast showed pancreas is unremarkable. There is possibly a tiny small stone, indeterminate subcentimeter pulmonary nodules, largest measures 7 mm.   Ultrasound of the abdomen showed gallbladder sludge versus narrowing. Non-shadowing cholelithiasis without sonographic evidence of acute cholecystitis.   BRIEF SUMMARY OF HOSPITAL COURSE: [name] is a 54 year old Caucasian female with past medical history of hypertension and alcohol use admitted for:  1. Acute mild pancreatitis, probably combined alcohol use as well as gallstone related, but upon review of ultrasound of the abdomen, there is some sludge and possible gallstone. Her lipase was around 800 and prior to discharge was in the 700s. The patient was clinically stable and able to tolerate diet prior to discharge. IV fluids were given.  2. Acute end-stage induced gastritis. The patient has history of lupus and has taken chronically NSAIDs on a daily basis for several years. She continued to have some abdominal discomfort and was seen by Dr. Mechele CollinElliott who recommended an EGD, which was done on 03/14/2012 and showed acute  gastritis. The patient was advised to stop ibuprofen, take Tylenol instead, take Carafate and PPI, and follow up with Dr. Mechele CollinElliott as needed.  3. Alcohol abuse. Counseled on cessation. The patient says she drinks socially but is usually on a regular basis. On CIWA protocol. Did not show any signs of withdrawal.  4. Hypertension. Metoprolol was given. Blood pressure remained stable.  5. Anxiety. Xanax p.r.n. was prescribed.   Her hospital stay otherwise remained stable. The patient remained a FULL CODE.   TIME SPENT: 40 minutes.  ____________________________ Wylie HailSona A. Allena KatzPatel, MD sap:slb D: 03/15/2012 07:38:23 ET T: 03/15/2012 13:42:46 ET JOB#: [id number]  cc: Janaiyah Blackard A. Allena KatzPatel, MD, <Dictator> Dwana CurdGraham S. Para Marchuncan, MD Willow OraSONA A Christinia Lambeth MD ELECTRONICALLY SIGNED 04/06/2012 10:33

## 2014-07-27 NOTE — Consult Note (Signed)
Brief Consult Note: Diagnosis: acute pancreatitis.   Consult note dictated.   Orders entered.   Discussed with Attending MD.   Comments: Patient seen and evaluated. Acute pancreatitis with abdo pain, nasuea, and vomiting. Still in quite a bit of pain and still very nauseated. Reccommend returning to NPO status for now. Mild LFT elevation..recent cholecystectomy earlier this year.Marland Kitchen.?pancreatitis related to recent alcohol use vs retained stone/biliary etiology. Will get MRCP. NPO, IV Fluids, Pain control and antiemetics PRN. Counseled on abstaining from alcohol. Will follow.  Electronic Signatures: Ashok Cordiaarle, Laconda Basich M (PA-C)  (Signed 02-Jun-14 12:27)  Authored: Brief Consult Note   Last Updated: 02-Jun-14 12:27 by Ashok CordiaEarle, Setareh Rom M (PA-C)

## 2014-07-27 NOTE — Consult Note (Signed)
CC: pancreatitis.  Pt doing well, lipase down but still up a bit.  OK for discharge tomorrow if continues to do well.  Electronic Signatures: Scot JunElliott, Chirsty Armistead T (MD)  (Signed on 05-Jun-14 18:24)  Authored  Last Updated: 05-Jun-14 18:24 by Scot JunElliott, Sahra Converse T (MD)

## 2014-07-27 NOTE — Consult Note (Signed)
CC: pancreatitis.  See full consult PA Brantley StageKaryn Earle.  Please keep pt NPO except a few ice chips due to her pancreatitis.  Her MRCP was negative today.  Hold all oral meds that can be held.  Needs arrangement for out pt rehab for alcoholism.  Electronic Signatures: Scot JunElliott, Gustava Berland T (MD)  (Signed on 02-Jun-14 20:02)  Authored  Last Updated: 02-Jun-14 20:02 by Scot JunElliott, Jacere Pangborn T (MD)

## 2014-07-27 NOTE — Consult Note (Signed)
Details:   - Psychiatry: Came by to follow up with patient. She remains motivated to stop drinking. I provided written inforfation and contact materials for her for local AA and for local SA treatment through IOP either at [facility] in [place] or the [facility] in [place].Did some education and support with her. She's having pain today with starting clear liquids. We discussed how pancreatitis may take some time to heal. Will follow up as needed.   Electronic Signatures: Audery Amellapacs, Alisyn Lequire T (MD)  (Signed 04-Jun-14 13:54)  Authored: Details   Last Updated: 04-Jun-14 13:54 by Audery Amellapacs, Madyson Lukach T (MD)

## 2014-07-27 NOTE — Consult Note (Signed)
Brief Consult Note: Diagnosis: alcohol dependence.   Patient was seen by consultant.   Consult note dictated.   Recommend further assessment or treatment.   Comments: Psychiatry: Patient seen and note done. Patient with alcohol dependence and consequent pancreatitis. she, wisely, wants to stop. Not showing signs of serious withdrawl. Will bring information about outpt treatment.  Electronic Signatures: Audery Amellapacs, John T (MD)  (Signed 03-Jun-14 20:59)  Authored: Brief Consult Note   Last Updated: 03-Jun-14 20:59 by Audery Amellapacs, John T (MD)

## 2014-07-27 NOTE — Consult Note (Signed)
PATIENT NAME:  Abigail [name], Abigail [name] MR#:  [id number] DATE OF BIRTH:  June 09, 1960  DATE OF CONSULTATION:  09/06/2012  REFERRING PHYSICIAN:   CONSULTING PHYSICIAN:  Audery Amel, MD  IDENTIFYING INFORMATION AND REASON FOR CONSULT: A 54 year old woman who came to the hospital with acute pancreatitis. Consultation for assistance with alcohol dependence.   HISTORY OF PRESENT ILLNESS: Information obtained from the patient and the chart. The patient states that she only started drinking seriously about a year and a half ago when her sister died. She got very depressed after that and her drinking, which had previously been social, became more of a regular thing with a higher volume. She tells me that recently she has been drinking about 2 large-sized cocktails of gin or vodka every day. She is aware that it has certainly played a causative role in her current pancreatitis. She has been feeling more depressed. She has difficulty sleeping at night with a pattern typical for alcohol abuse. She has symptoms of depression including generally depressed mood, decreased energy, frequent crying but denies any suicidal ideation. No sign of any psychotic symptoms. She has been able to continue functioning in her work despite her drinking. Her family has noticed it and have commented that she needs to stop. The patient has not been abusing any other drugs. She is not currently receiving any other psychiatric treatment. She has significant positives in her life including a job that she seems to enjoy and adult children she appreciates. She also has a long-term boyfriend whom she says she generally gets along well with and likes but he may drink a little bit more than is good for her to be around.   PAST PSYCHIATRIC HISTORY: No history of psychiatric treatment. No history of diagnosis of depression. No history of antidepressants except for a brief trial on trazodone used mainly as a sleep aid. No history of suicide attempts or  violence. No history of psychosis. She has had an alcohol problem for about a year to a year and a half. She is trying to stop on her own and has been able to stop for a few weeks at a time before relapsing. Her relapse was related to getting more sad thinking about her sister.   MEDICAL HISTORY: The patient has acute pancreatitis. Also seems to probably have high blood pressure. Otherwise in fairly good health.   CURRENT MEDICATIONS: In the hospital right now she is taking metoprolol 50 mg twice a day, Protonix intravenously, IV fluid for hydration, docusate and pain medicine. She is also on a detox protocol but has not required much medication for it.   FAMILY HISTORY: She has an older brother who has had an alcohol dependence problem. No other family history of mental illness identified.   SOCIAL HISTORY: The patient works as a Armed forces operational officer. Likes her job reasonably well. Has a long-standing boyfriend she lives with. She says she loves him, but he drinks with his buddies a little too much and she would like him to get out of the house for a while because of that. She gets along well with her adult children.   REVIEW OF SYSTEMS: Acute abdominal pain typical for pancreatitis, acute nausea. It is being well controlled in the hospital. Mood stated sad and tearful. She feels somewhat bad about herself. Denies any suicidal ideation. Denies any psychotic symptoms. She is not feeling shaky, not having any tremor, not feeling sweaty.   MENTAL STATUS EXAM: The patient is appropriately dressed and groomed  given that she is acutely sick in the hospital. She is pleasant and cooperative and very appropriate. Makes good eye contact. Has appropriate psychomotor activity. Speech is normal in rate, tone and volume. Affect is tearful at times, especially talking about her sister, but controllable and not overwhelmed. Mood is stated as being still down. Thoughts appear to be generally lucid. No sign of loosening of  associations or delusions. Denies hallucinations. Totally denies suicidal or homicidal ideation. Alert and oriented x4. Normal intelligence. Good judgment and insight.   ASSESSMENT: This 54 year old woman has had an alcohol dependence problem develop relatively later in life after a period of grieving. This is a pattern not uncommon among adult women. Nevertheless, she does have alcohol dependence at this point. Fortunately, her insight is good, and she is able to articulate motivation for it and is able to articulate an understanding that she will need to work at it to stay sober. Right now, she is not having serious symptoms of withdrawal and does not require any further detox. She is interested in outpatient treatment in the community.   TREATMENT PLAN: I did some education with her, reassuring her that she is past the phase where she is at high risk of a seizure and is probably past the phase where she is at high risk of serious withdrawal. Her risk of DTs seems to probably be very small as well. I educated her about the way that pancreatitis tends to recur and just worsen with further drinking in an attempt to motivate her to stay sober. We discussed some options for sobriety including Alcoholics Anonymous and local treatment programs. I will try and get her more specific information tomorrow. The option of going to an inpatient rehab program would be available but at this point, she does not express a lot of interest in it and I do not think it is necessarily required, particularly given that she has several factors that would indicate a good prognosis. Normally, we would refer her to be the intensive outpatient program here at the hospital but currently that program is not meeting and I do not know when it will be starting again.   DIAGNOSIS, PRINCIPAL AND PRIMARY:  AXIS I: Alcohol dependence.   SECONDARY DIAGNOSES:  AXIS I: Major depression versus substance-induced mood disorder versus grieving,  extended bereavement.  AXIS II: Deferred.  AXIS III: Pancreatitis.  AXIS IV: Moderate from this illness and the death of her sister.  AXIS V: Functioning at time of evaluation 57.     ____________________________ Audery AmelJohn T. Seairra Otani, MD jtc:gb D: 09/06/2012 21:07:27 ET T: 09/06/2012 21:37:26 ET JOB#: [id number]  cc: Audery AmelJohn T. Newell Wafer, MD, <Dictator> Audery AmelJOHN T Arriyana Rodell MD ELECTRONICALLY SIGNED 09/07/2012 21:41

## 2014-07-28 NOTE — Discharge Summary (Signed)
PATIENT NAME:  Abigail [name], Abigail [name] MR#:  [id number] DATE OF BIRTH:  1960/05/22  DATE OF ADMISSION:  06/16/2013 DATE OF DISCHARGE:  06/18/2013  PRIMARY CARE PHYSICIAN: Dr. Para Marchuncan  CONSULTANT: Behavior medicine, Dr. Guss Bundehalla.  DISCHARGE DIAGNOSES: 1.  Suicidal attempt, SSRI overdose. 2.  Depression. 3.  Hypertension.  CODE STATUS: FULL.  CONDITION: Medically stable.   HOME MEDICATIONS: Please refer to the [facility] physician discharge instruction medication reconciliation list.   DISCHARGE DIET: Low-sodium diet.   DISCHARGE ACTIVITY: As tolerated.   DISPOSITION: The patient will be discharged to behavior medicine unit today.  REASON FOR ADMISSION: Suicidal attempt.  HOSPITAL COURSE: The patient is a 54 year old Caucasian female with past medical history of hypertension, lupus, and alcohol abuse, presenting to the ED with suicidal attempt. The patient took 29 tablets of 20 mg Prozac on the day of admission and also the patient drank half of a fifth of vodka at that time in an attempt to kill herself. For detailed history and physical examination, please refer to the admission note dictated by Dr. Clint GuyHower. On admission date, the patient had an EKG that showed normal sinus rhythm. QRS was 78. QTc was 480. Nonspecific T wave inversions in V1 and V2. Sodium 136, potassium 3.2, chloride 102, bicarb 20, BUN 30, creatinine 0.64. Magnesium 1.4. Alcohol level 326. Bilirubin 1.1, AST 68. Otherwise within normal limits. Urine drug screen negative. Urinalysis negative.  1.  Suicidal attempt with SSRI overdose. After admission the patient was kept on 1 to 1 sitter. Prozac was discontinued. She was treated with IV fluid support. Dr. Guss Bundehalla evaluated the patient yesterday and suggested discontinuing 1 to 1 sitter and admit to behavior medicine unit.   2.  Depression. As mentioned above. 3.  Alcohol abuse. The patient was on CIWA protocol. No treatment. No withdrawal.  4.  Hypomagnesemia. The patient was treated with  magnesium supplement. 5.  Hypertension. Was treated with Lopressor. Blood pressure was controlled.   The patient denies any suicidal ideation or depression after admission. Her vital signs are stable. She is medically stable and will be discharged from medical floor and admitted to behavior medicine unit. I discussed the patient's discharge plan with the patient's nurse and case manager.   TIME SPENT: About 36 minutes.  ____________________________ Shaune PollackQing Chantrell Apsey, MD qc:sb D: 06/18/2013 14:41:00 ET T: 06/19/2013 10:37:37 ET JOB#: [id number]  cc: Shaune PollackQing Eston Heslin, MD, <Dictator> Shaune PollackQING Alexanderia Gorby MD ELECTRONICALLY SIGNED 06/19/2013 16:34

## 2014-07-28 NOTE — H&P (Signed)
PATIENT NAME:  Abigail [name], Abigail [name] MR#:  [id number] DATE OF BIRTH:  [birth year]  DATE OF ADMISSION:  06/16/2013  REFERRING PHYSICIAN: Dr. Governor Rooks.    PRIMARY CARE PHYSICIAN: Dr. Crawford Givens.   CHIEF COMPLAINT: Suicide attempt.   HISTORY OF PRESENT ILLNESS: A 54 year old Caucasian female with past medical history of hypertension, lupus, alcohol abuse, presenting after a suicide attempt. She states that she took 29 tablets of 20 mg Prozac around 5 p.m. the day of admission as well as a half of a fifth of vodka at that time in an attempt to kill herself. She states "I was stupid" after a fight with her boyfriend. After this attempt, she called 911 herself. Currently, she is only complaining of mild anxiousness. Otherwise, no further symptoms.   REVIEW OF SYSTEMS:   CONSTITUTIONAL: Denies fever, fatigue, weakness.  EYES: Denies blurred vision, double vision, eye pain.   ENT: Denies tinnitus, ear pain, hearing loss.  RESPIRATORY: Denies cough, wheeze, shortness of breath.  CARDIOVASCULAR: Denies chest pain, palpitations, edema.  GASTROINTESTINAL: Denies nausea, vomiting, diarrhea, abdominal pain.  GENITOURINARY: Denies dysuria or hematuria.  ENDOCRINE: Denies nocturia or thyroid problems.  HEMATOLOGIC AND LYMPHATIC: Denies easy bruising or bleeding.  SKIN: Denies rashes or lesions.  MUSCULOSKELETAL: Denies pain in the neck, back, shoulders, knees, hips or arthritis symptoms.   NEUROLOGIC: Denies paralysis, paresthesias.  PSYCHIATRIC: Positive for anxiety as well as depression.   Full review of systems performed by me is negative.   PAST MEDICAL HISTORY: Hypertension, recurrent pancreatitis, alcohol abuse and lupus.   SOCIAL HISTORY: Positive for alcohol usage. Denies any tobacco or drug usage. Last alcohol intake immediately prior to arrival.   FAMILY HISTORY: Positive for breast cancer.   ALLERGIES: LIPITOR, LISINOPRIL, REMERON, STEROIDS AND LATEX.   HOME MEDICATIONS: Include Prozac  20 mg p.o. daily, Lopressor 50 mg p.o. b.i.d., Carafate 1 g p.o. 3 times daily as needed, Protonix 40 mg p.o. b.i.d.   PHYSICAL EXAMINATION:  VITAL SIGNS: Temperature 98.6, heart rate 103, respirations 20, blood pressure 132/83, saturating 96% on room air. Weight 72.6, BMI 30.3  GENERAL: Well-nourished, well-developed, Caucasian female, currently in no acute distress.  HEAD: Normocephalic, atraumatic.  EYES: Pupils equal, round, reactive to light. Extraocular muscles intact. No scleral icterus.  MOUTH: Moist mucosal membrane. Dentition intact. No abscess noted.  EARS, NOSE, THROAT: Throat clear without exudates. No external lesions.  NECK: Supple. No thyromegaly. No nodules. No JVD.  PULMONARY: Clear to auscultation bilaterally without wheezes, rubs or rhonchi. No use of accessory muscles. Good respiratory effort.  CHEST: Nontender to palpation.  CARDIOVASCULAR: S1, S2, tachycardic. No murmurs, rubs or gallops. No edema. Pedal pulses 2+ bilaterally.  GASTROINTESTINAL: Soft, nontender, nondistended. No masses. Positive bowel sounds. No hepatosplenomegaly.  MUSCULOSKELETAL: No swelling, clubbing, edema. Range of motion full in all extremities.   NEUROLOGIC: Cranial nerves II through XII intact. No gross focal neurological deficits. Sensation intact. Reflexes intact.  SKIN: No ulcerations, lesions, rash, cyanosis. Skin warm, dry. Turgor intact.  PSYCHIATRIC: Mood and affect: She appears anxious and agitated. She is, however, awake, alert, oriented x 3. Insight and judgment poor given apparent suicide attempt.   LABORATORY DATA: EKG performed revealing normal sinus rhythm. QRS is 78. QTc of 480. Nonspecific T inversions V1 and V2. Otherwise, no ST or T wave abnormalities. Remainder of laboratory data: Sodium 136, potassium 3.2, chloride 102, bicarb 20, anion gap of 14, BUN 13, creatinine 0.64, glucose 92. Magnesium 1.4. Ethanol 326. LFTs: Bilirubin of 1.1, AST of  68, otherwise within normal limits.  Urine drug screen negative. WBC 11.1, hemoglobin 13.5, platelets 251. Urinalysis negative for evidence of infection. Salicylate less than 1.17. Acetaminophen less than 2.   ASSESSMENT AND PLAN: A 54 year old Caucasian female with history of hypertension, anxiety and depression not otherwise specified, presenting after a suicide attempt.  1. Suicide attempt with selective serotonin reuptake inhibitor overdose: Took 29 tablets of 20 mg Prozac. Her QRS and QTc are within normal limits. Case discussed with Poison Control in the Emergency Department who recommended observation status with no acute treatment as she is asymptomatic at this time. Will add suicide precautions as well as psychiatry consult. She will be involuntarily committed. Will hold her Prozac as well.  2. Alcohol abuse: Last known alcohol intake prior to arrival. Initiate CIWA protocol, including multivitamin, folate supplementation.  3. Hypomagnesemia: Replace magnesium to a goal of 2.  4. Hypokalemia: Replace potassium to goal of 4 to 5.  5. Hypertension: Continue Lopressor.  6. Venous thromboembolism prophylaxis with heparin subcutaneous.   The patient is FULL CODE.   TIME SPENT: 45 minutes   ____________________________ Cletis Athensavid K. Hower, MD dkh:gb D: 06/16/2013 21:20:06 ET T: 06/16/2013 22:10:23 ET JOB#: [id number]  cc: Cletis Athensavid K. Hower, MD, <Dictator> DAVID Synetta ShadowK HOWER MD ELECTRONICALLY SIGNED 06/17/2013 0:48

## 2014-07-28 NOTE — Consult Note (Signed)
PATIENT NAME:  Abigail [name], [name] MR#:  [id number] DATE OF BIRTH:  [birth year]  DATE OF CONSULTATION:  05/20/2013  CONSULTING PHYSICIAN:  Jamilah Jean K. Guss Bundehalla, MD  PLACE [facility] DICTATION: [facility] room [facility]158 [place].  AGE: 54 years.  SEX: Female.   RACE: White.  SUBJECTIVE: The patient was seen in consultation in room #158. 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 The patient divorced, and currently she lives with a man friend. The patient reports that she has been drinking alcohol continuously at the rate of a pint of vodka, sometimes more, sometimes less.  Last night she drank and felt drunk, and started having suicidal thoughts and depression, and called the ambulance for help.   PAST PSYCHIATRIC HISTORY: The patient reported that she was admitted at alcohol detox center in January 2015 at [facility], and this program lasted for 2 weeks and stayed sober for a week and then started drinking alcohol.   PAST ALCOHOL AND DRUG: Denies any DWIs, denies arrested for public drunkenness, but drinks alcohol on a regular basis for quite some time.  Denies drug abuse.   MENTAL STATUS: The patient is seen dressed in hospital clothes, alert and oriented to place, person, and time, fully aware of situation, [facility] to [facility]. Affect is flat with mood depressed, admits being hopeless and helpless at times, denies feeling worthless or useless.  Did have some suicidal admissions and thoughts at the time of admission. Currently she contacts for safety and wants to get help and do whatever is best for her. No psychosis.  Denies auditory or visual hallucinations.  Cognition intact. General information is fair.  Insight and judgment is guarded with poor impulse control.  IMPRESSION: Alcohol dependence, chronic, continuous, substance abuse, mood disorder.  PLAN: Discontinue 1-on-1  observation. [facility] after being medically cleared and stable, and when bed is available.  Staff nurse explained about the same.     ____________________________ Jannet MantisSurya K. Guss Bundehalla, MD skc:sg D: 06/17/2013 21:31:01 ET T: 06/18/2013 12:38:08 ET JOB#: [id number]  cc: Monika SalkSurya K. Guss Bundehalla, MD, <Dictator> Beau FannySURYA K Tonni Mansour MD ELECTRONICALLY SIGNED 06/18/2013 19:58

## 2014-08-07 ENCOUNTER — Other Ambulatory Visit: Payer: Self-pay | Admitting: Family Medicine

## 2014-08-07 ENCOUNTER — Other Ambulatory Visit (INDEPENDENT_AMBULATORY_CARE_PROVIDER_SITE_OTHER): Payer: BLUE CROSS/BLUE SHIELD

## 2014-08-07 DIAGNOSIS — E538 Deficiency of other specified B group vitamins: Secondary | ICD-10-CM | POA: Diagnosis not present

## 2014-08-07 LAB — VITAMIN B12: VITAMIN B 12: 216 pg/mL (ref 211–911)

## 2014-08-09 ENCOUNTER — Ambulatory Visit (INDEPENDENT_AMBULATORY_CARE_PROVIDER_SITE_OTHER): Payer: BLUE CROSS/BLUE SHIELD

## 2014-08-09 DIAGNOSIS — E538 Deficiency of other specified B group vitamins: Secondary | ICD-10-CM | POA: Diagnosis not present

## 2014-08-09 MED ORDER — CYANOCOBALAMIN 1000 MCG/ML IJ SOLN
1000.0000 ug | Freq: Once | INTRAMUSCULAR | Status: AC
  Administered 2014-08-09: 1000 ug via INTRAMUSCULAR

## 2014-08-14 NOTE — H&P (Signed)
PATIENT NAME:  Abigail [name], Abigail G [id number] OF BIRTH:  07/19/1960 OF ADMISSION:  03/15/2015OF ASSESSMENT:  06/19/2013  REFERRING PHYSICIAN: Dr. Angelica Ranavid Hower PHYSICIAN:  Kristine LineaJolanta Tiran Sauseda, MD   DATA: Abigail [name] is a 54 year old female with a history of depression and alcoholism.  COMPLAINT: "I need help."   OF PRESENT ILLNESS: Abigail [name] completed residential alcohol, treatment program at [facility] in [place] in January 2015. She maintained sobriety for about a month. She relapsed on alcohol when she had to help her brother in law. Her dear sister died 4 years ago and the patient has not been able to deal with this loss other than by drinking. On the day of admission to medical floor, following a conversation with her brother in law, she needed to calm herself down. She did it by alternating sips of vodka and Prozac pills. After she took 29 pills, she called for help. She was briefly admitted to medical floor and transferred to psychiatry for treatment of depression. The patient is glad to be alive but still overcome with grief, shame and despair. She is unable to contract for safety. She has been extremely tearful, unable to participate in the interview. She complains of feeling of guilt, hopelessness, helplessness, crying, social isolation, poor memory and concentration, insomnia, decreased appetite and suicidal thoughts.  She reports heightened anxiety. There are no psychotic symptoms. She denies other than alcohol substance use.  PSYCHIATRIC HISTORY: Multiple detoxes and treatments. Longest period of sobriety just few months. Denies suicide attempts. She participates in IOP program at [facility]. PSYCHIATRIC HISTORY: None reported. MEDICAL HISTORY: Hypertension, recurrent pancreatitis, lupus, GERD.  MEDICATIONS: Include Prozac 20 mg p.o. daily, Lopressor 50 mg p.o. b.i.d., Carafate 1 g p.o. 3 times daily as needed, Protonix 40 mg p.o. b.i.d.  LIPITOR, LISINOPRIL, REMERON, STEROIDS AND LATEX.  HISTORY: She lives with her  boyfriend. Unfortunately he is a drinker too. She has two children ages 6728 and 8725 who are independent.   REVIEW OF SYSTEMS:No fevers or chills. Positive for fatigue.  No double or blurred vision. No hearing loss. No shortness of breath or cough. No chest pain or orthopnea. No abdominal pain, nausea, vomiting, or diarrhea. No incontinence or frequency. No heat or cold intolerance. No anemia or easy bruising. No acne or rash. No muscle or joint pain. No tingling or weakness. See history of present illness for details.  EXAMINATION:SIGNS: Blood pressure 115/84, pulse 95, respirations 20, temperature 98.1. This is a slender female in no acute distress. The pupils are equal, round, and reactive to light. Sclerae are anicteric. Supple. No thyromegaly. Clear to auscultation. No dullness to percussion. Regular rhythm and rate. No murmurs, rubs, or gallops. Soft, nontender, nondistended. Positive bowel sounds. Normal muscle strength in all extremities. No rashes or bruises. No cervical adenopathy. Cranial nerves II through XII are intact.   LABORATORY DATA: EKG performed revealing normal sinus rhythm. QRS is 78. QTc of 480. Nonspecific T inversions V1 and V2. Otherwise, no ST or T wave abnormalities. Remainder of laboratory data: Sodium 136, potassium 3.2, chloride 102, bicarb 20, anion gap of 14, BUN 13, creatinine 0.64, glucose 92. Magnesium 1.4. Ethanol 326. LFTs: Bilirubin of 1.1, AST of 68, otherwise within normal limits. Urine drug screen negative. WBC 11.1, hemoglobin 13.5, platelets 251. Urinalysis negative for evidence of infection. Salicylate less than 1.17. Acetaminophen less than 2.  STATUS EXAMINATION ON ADMISSION: The patient is alert and oriented to person, place, time, and situation. She is pleasant and polite but terribly tearful. She is well  groomed and casually dressed. She maintains limited eye contact. Her speech is soft. Mood is sad with crying affect. Thought process is logical and goal oriented.  Thought content: She denies thoughts of hurting herself or others, but was admitted after a suicide attempt by overdose. She is  not paranoid or delusional. She denies experiencing auditory or visual hallucinations. Her cognition is grossly intact. She registers 3 out of 3 and recalls 3 out of 3 objects after 5 minutes. She can spell "world" forward and backward. She knows the current president. Her intelligence is average with average fund of knowledge. Her insight and judgment seem poor.RISK ASSESSMENT: This is a patient with depression, anxiety and alcoholism admitted after a suicide attempt.  DIAGNOSES: I:  Major depressive disorder recurrent severe.  Alcohol dependence.   AXIS II:  Deferred. III:  Deferred. IV:  Mental illness, substance abuse. V:  Global Assessment of Functioning on admission 25.  The patient was admitted to [facility] for safety, stabilization, and medication management. She was initially placed on suicide precautions and was closely monitored for any unsafe behaviors. She underwent full psychiatric and risk assessment. She received pharmacotherapy, individual and group psychotherapy, substance abuse counseling, and support from therapeutic milieu.  Suicidal ideation: She is able to contract for safety.    Alcohol detox: we will continue Librium taper, she is quite shaky.  Mood. We will hold Prozac for now.    Substance abuse: She has little insight into this problem. She just completed [facility] treatment.    Disposition: TBE.    Electronic Signatures: Kristine LineaPucilowska, Monzerrat Wellen (MD)  (Signed on 17-Mar-15 00:12)  Authored  Last Updated: 17-Mar-15 00:12 by Kristine LineaPucilowska, Alric Geise (MD)

## 2014-09-11 ENCOUNTER — Ambulatory Visit (INDEPENDENT_AMBULATORY_CARE_PROVIDER_SITE_OTHER): Payer: BLUE CROSS/BLUE SHIELD

## 2014-09-11 DIAGNOSIS — E538 Deficiency of other specified B group vitamins: Secondary | ICD-10-CM

## 2014-09-11 MED ORDER — CYANOCOBALAMIN 1000 MCG/ML IJ SOLN
1000.0000 ug | Freq: Once | INTRAMUSCULAR | Status: AC
  Administered 2014-09-11: 1000 ug via INTRAMUSCULAR

## 2014-10-09 ENCOUNTER — Ambulatory Visit (INDEPENDENT_AMBULATORY_CARE_PROVIDER_SITE_OTHER): Payer: BLUE CROSS/BLUE SHIELD | Admitting: Family Medicine

## 2014-10-09 ENCOUNTER — Encounter (INDEPENDENT_AMBULATORY_CARE_PROVIDER_SITE_OTHER): Payer: Self-pay

## 2014-10-09 ENCOUNTER — Encounter: Payer: Self-pay | Admitting: Family Medicine

## 2014-10-09 VITALS — BP 122/84 | HR 65 | Temp 98.9°F | Wt 183.5 lb

## 2014-10-09 DIAGNOSIS — M5431 Sciatica, right side: Secondary | ICD-10-CM

## 2014-10-09 DIAGNOSIS — E538 Deficiency of other specified B group vitamins: Secondary | ICD-10-CM

## 2014-10-09 MED ORDER — CYANOCOBALAMIN 1000 MCG/ML IJ SOLN
1000.0000 ug | Freq: Once | INTRAMUSCULAR | Status: AC
  Administered 2014-10-09: 1000 ug via INTRAMUSCULAR

## 2014-10-09 MED ORDER — CYCLOBENZAPRINE HCL 10 MG PO TABS: 5.0000 mg | ORAL_TABLET | Freq: Three times a day (TID) | ORAL | Status: DC | PRN
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
## 2014-10-09 NOTE — Patient Instructions (Signed)
Flexeril for muscle spasms, ice frequently, and rest.   Gently stretch when possible.  This should gradually get better.   Take care.

## 2014-10-10 DIAGNOSIS — M5430 Sciatica, unspecified side: Secondary | ICD-10-CM | POA: Insufficient documentation

## 2014-10-10 NOTE — Assessment & Plan Note (Signed)
Low speed, no sx at the time.  No emergent sx, no need to image today.  Flexeril for muscle spasms, ice frequently, and rest.  Gently stretch when possible.  This should gradually get better.  D/w pt.  She agrees.  F/u prn.

## 2014-10-10 NOTE — Assessment & Plan Note (Signed)
Injection done today at [facility].

## 2014-10-11 ENCOUNTER — Ambulatory Visit
Admission: RE | Admit: 2014-10-11 | Discharge: 2014-10-11 | Disposition: A | Discharge status: Home / Self Care | Payer: BLUE CROSS/BLUE SHIELD | Source: Ambulatory Visit | Attending: Family Medicine | Admitting: Family Medicine

## 2014-10-11 DIAGNOSIS — Z1231 Encounter for screening mammogram for malignant neoplasm of breast: Secondary | ICD-10-CM | POA: Diagnosis present

## 2014-10-11 DIAGNOSIS — Z1239 Encounter for other screening for malignant neoplasm of breast: Secondary | ICD-10-CM

## 2014-10-12 ENCOUNTER — Ambulatory Visit: Payer: BLUE CROSS/BLUE SHIELD

## 2014-10-24 ENCOUNTER — Telehealth: Payer: Self-pay | Admitting: Family Medicine

## 2014-10-24 DIAGNOSIS — M5430 Sciatica, unspecified side: Secondary | ICD-10-CM

## 2014-10-24 NOTE — Telephone Encounter (Signed)
Did they help temporarily but then the pain came back? If so, we need to restart it and send her to PT.  If they didn't help at all, we need to make other plans.   Let me know.  Thanks.

## 2014-10-24 NOTE — Telephone Encounter (Signed)
Pt has used all of the muscle relaxers for back pain and did not know what the next step is.  Back pain is back. cb number [phone].

## 2014-10-25 MED ORDER — CYCLOBENZAPRINE HCL 10 MG PO TABS: 5.0000 mg | ORAL_TABLET | Freq: Three times a day (TID) | ORAL | Status: DC | PRN

## 2014-10-25 NOTE — Telephone Encounter (Signed)
Both done. Thanks.

## 2014-10-25 NOTE — Telephone Encounter (Signed)
The muscle relaxers did help but after finishing the Rx, she noticed the back pain returning.  I advised patient that in that case, you would restart muscle relaxers and send her to PT.  Please send Rx and do referral to PT.

## 2014-11-13 ENCOUNTER — Ambulatory Visit: Payer: BLUE CROSS/BLUE SHIELD

## 2014-11-14 ENCOUNTER — Telehealth: Payer: Self-pay | Admitting: *Deleted

## 2014-11-14 ENCOUNTER — Ambulatory Visit (INDEPENDENT_AMBULATORY_CARE_PROVIDER_SITE_OTHER): Payer: BLUE CROSS/BLUE SHIELD | Admitting: *Deleted

## 2014-11-14 DIAGNOSIS — E538 Deficiency of other specified B group vitamins: Secondary | ICD-10-CM

## 2014-11-14 MED ORDER — CYANOCOBALAMIN 1000 MCG/ML IJ SOLN
1000.0000 ug | Freq: Once | INTRAMUSCULAR | Status: AC
  Administered 2014-11-14: 1000 ug via INTRAMUSCULAR

## 2014-11-14 NOTE — Telephone Encounter (Signed)
Please have her ask PT for their input and then f/u if needed, ie not improved.   I'm glad to see her about it.  Thanks.

## 2014-11-14 NOTE — Telephone Encounter (Signed)
Pt was here for b12 inj, she mentioned that she has still been having low back pain. She thinks PT has helped, but she has also been having intermittent sharp R hip pain that is worsened by bearing weight on that leg. She initaly thought that she may have been overdoing her home exercises from PT, but isn't sure. She plans to discuss this while at PT tomorrow, but was also wondering if you needed to eval her again and possibly do xrays. Please advise?

## 2014-11-15 NOTE — Telephone Encounter (Signed)
Left detailed message on voicemail.  

## 2014-12-18 ENCOUNTER — Ambulatory Visit (INDEPENDENT_AMBULATORY_CARE_PROVIDER_SITE_OTHER): Payer: BLUE CROSS/BLUE SHIELD | Admitting: *Deleted

## 2014-12-18 DIAGNOSIS — E538 Deficiency of other specified B group vitamins: Secondary | ICD-10-CM | POA: Diagnosis not present

## 2014-12-18 MED ORDER — CYANOCOBALAMIN 1000 MCG/ML IJ SOLN
1000.0000 ug | Freq: Once | INTRAMUSCULAR | Status: AC
  Administered 2014-12-18: 1000 ug via INTRAMUSCULAR

## 2015-01-01 ENCOUNTER — Other Ambulatory Visit: Payer: Self-pay | Admitting: Family Medicine

## 2015-01-01 ENCOUNTER — Ambulatory Visit (INDEPENDENT_AMBULATORY_CARE_PROVIDER_SITE_OTHER): Payer: BLUE CROSS/BLUE SHIELD | Admitting: Family Medicine

## 2015-01-01 ENCOUNTER — Encounter: Payer: Self-pay | Admitting: Family Medicine

## 2015-01-01 VITALS — BP 104/72 | HR 80 | Temp 99.0°F | Ht 61.0 in | Wt 185.8 lb

## 2015-01-01 DIAGNOSIS — R509 Fever, unspecified: Secondary | ICD-10-CM | POA: Diagnosis not present

## 2015-01-01 DIAGNOSIS — R1013 Epigastric pain: Secondary | ICD-10-CM | POA: Diagnosis not present

## 2015-01-01 LAB — COMPREHENSIVE METABOLIC PANEL
ALBUMIN: 3.5 g/dL (ref 3.5–5.2)
ALK PHOS: 77 U/L (ref 39–117)
ALT: 12 U/L (ref 0–35)
AST: 16 U/L (ref 0–37)
BILIRUBIN TOTAL: 0.7 mg/dL (ref 0.2–1.2)
BUN: 9 mg/dL (ref 6–23)
CALCIUM: 9.2 mg/dL (ref 8.4–10.5)
CO2: 27 mEq/L (ref 19–32)
Chloride: 103 mEq/L (ref 96–112)
Creatinine, Ser: 0.66 mg/dL (ref 0.40–1.20)
GFR: 100.02 mL/min (ref >60.00)
GLUCOSE: 116 mg/dL — AB (ref 70–99)
POTASSIUM: 4.4 meq/L (ref 3.5–5.1)
Sodium: 136 mEq/L (ref 135–145)
Total Protein: 7.6 g/dL (ref 6.0–8.3)

## 2015-01-01 LAB — CBC WITH DIFFERENTIAL/PLATELET
BASOS ABS: 0 10*3/uL (ref 0.0–0.1)
Basophils Relative: 0.1 % (ref 0.0–3.0)
Eosinophils Absolute: 0.1 10*3/uL (ref 0.0–0.7)
Eosinophils Relative: 0.7 % (ref 0.0–5.0)
HEMATOCRIT: 40 % (ref 36.0–46.0)
HEMOGLOBIN: 13.3 g/dL (ref 12.0–15.0)
LYMPHS ABS: 0.9 10*3/uL (ref 0.7–4.0)
LYMPHS PCT: 8.5 % — AB (ref 12.0–46.0)
MCHC: 33.2 g/dL (ref 30.0–36.0)
MCV: 97.1 fl (ref 78.0–100.0)
Monocytes Absolute: 1.6 10*3/uL — ABNORMAL HIGH (ref 0.1–1.0)
Monocytes Relative: 15 % — ABNORMAL HIGH (ref 3.0–12.0)
NEUTROS PCT: 75.7 % (ref 43.0–77.0)
Neutro Abs: 8.1 10*3/uL — ABNORMAL HIGH (ref 1.4–7.7)
Platelets: 369 10*3/uL (ref 150.0–400.0)
RBC: 4.12 Mil/uL (ref 3.87–5.11)
RDW: 12.6 % (ref 11.5–15.5)
WBC: 10.8 10*3/uL — AB (ref 4.0–10.5)

## 2015-01-01 LAB — LIPASE: LIPASE: 13 U/L (ref 11.0–59.0)

## 2015-01-01 NOTE — Progress Notes (Signed)
Subjective:    Patient ID: Abigail [name], female    DOB: Aug 11, 1960, 54 y.o.   MRN: [id number]  Fever  This is a new problem. The current episode started in the past 7 days. The maximum temperature noted was 99 to 99.9 F (never > 100 F). The temperature was taken using an oral thermometer. Associated symptoms include nausea. Pertinent negatives include no abdominal pain, congestion, coughing, ear pain, headaches, muscle aches, rash, sore throat, urinary pain, vomiting or wheezing. Associated symptoms comments:  Fatigued  no urinary issues  no diarrhea joint pain in neck, arms, knees, hips, ankles, wrists.  No redness swelling.. She has tried NSAIDs for the symptoms. The treatment provided mild relief.   No known bites. Mark on right neck, ?scratch.  She has been having worse pain in epigastrum than usual.   Sick contacts: None  Social History /Family History/Past Medical History reviewed and updated if needed. Hx of ETOH pancreatitis. She has been having ETOH lately.   Review of Systems  Constitutional: Positive for fever.  HENT: Negative for congestion, ear pain and sore throat.   Respiratory: Negative for cough and wheezing.   Gastrointestinal: Positive for nausea. Negative for vomiting and abdominal pain.  Genitourinary: Negative for dysuria.  Skin: Negative for rash.  Neurological: Negative for headaches.       Objective:   Physical Exam  Constitutional: Vital signs are normal. She appears well-developed and well-nourished. She is cooperative.  Non-toxic appearance. She does not appear ill. No distress.  HENT:  Head: Normocephalic.  Right Ear: Hearing, tympanic membrane, external ear and ear canal normal. Tympanic membrane is not erythematous, not retracted and not bulging.  Left Ear: Hearing, tympanic membrane, external ear and ear canal normal. Tympanic membrane is not erythematous, not retracted and not bulging.  Nose: No mucosal edema or rhinorrhea. Right sinus  exhibits no maxillary sinus tenderness and no frontal sinus tenderness. Left sinus exhibits no maxillary sinus tenderness and no frontal sinus tenderness.  Mouth/Throat: Uvula is midline, oropharynx is clear and moist and mucous membranes are normal.  Eyes: Conjunctivae, EOM and lids are normal. Pupils are equal, round, and reactive to light. Lids are everted and swept, no foreign bodies found.  Neck: Trachea normal and normal range of motion. Neck supple. Carotid bruit is not present. No thyroid mass and no thyromegaly present.  Cardiovascular: Normal rate, regular rhythm, S1 normal, S2 normal, normal heart sounds, intact distal pulses and normal pulses.  Exam reveals no gallop and no friction rub.   No murmur heard. Pulmonary/Chest: Effort normal and breath sounds normal. No tachypnea. No respiratory distress. She has no decreased breath sounds. She has no wheezes. She has no rhonchi. She has no rales.  Abdominal: Soft. Normal appearance and bowel sounds are normal. There is no splenomegaly or hepatomegaly. There is tenderness in the epigastric area. There is no rigidity, no rebound, no guarding and no CVA tenderness.  Musculoskeletal:  No joint redness, or swelling  Neurological: She is alert.  Skin: Skin is warm, dry and intact. No rash noted.  Small scratch on right neck, not consistent with bite  Psychiatric: Her speech is normal and behavior is normal. Judgment and thought content normal. Her mood appears not anxious. Cognition and memory are normal. She does not exhibit a depressed mood.          Assessment & Plan:   Unclear etiology of low grade fever: Possible viral syndrome. Given joint pain  And ? Bite will eval  for lyme disease, but low on differential.  Given epigastric pain and pt history of EOTH induced pancreatitis: Will eval with lipase CMET cbc.

## 2015-01-01 NOTE — Patient Instructions (Signed)
No alcohol. Push clear liquids for now. Stop at lab on way out.

## 2015-01-01 NOTE — Progress Notes (Signed)
Pre visit review using our clinic review tool, if applicable. No additional management support is needed unless otherwise documented below in the visit note. 

## 2015-01-02 LAB — LYME AB/WESTERN BLOT REFLEX: B BURGDORFERI AB IGG+ IGM: 0.49 {ISR}

## 2015-01-06 ENCOUNTER — Emergency Department
Admission: EM | Admit: 2015-01-06 | Discharge: 2015-01-06 | Disposition: A | Discharge status: Home / Self Care | Payer: BLUE CROSS/BLUE SHIELD | Attending: Emergency Medicine | Admitting: Emergency Medicine

## 2015-01-06 ENCOUNTER — Encounter: Payer: Self-pay | Admitting: Emergency Medicine

## 2015-01-06 ENCOUNTER — Emergency Department: Payer: BLUE CROSS/BLUE SHIELD

## 2015-01-06 DIAGNOSIS — Z91040 Latex allergy status: Secondary | ICD-10-CM | POA: Insufficient documentation

## 2015-01-06 DIAGNOSIS — N12 Tubulo-interstitial nephritis, not specified as acute or chronic: Secondary | ICD-10-CM | POA: Diagnosis not present

## 2015-01-06 DIAGNOSIS — Z79899 Other long term (current) drug therapy: Secondary | ICD-10-CM | POA: Insufficient documentation

## 2015-01-06 DIAGNOSIS — I10 Essential (primary) hypertension: Secondary | ICD-10-CM | POA: Insufficient documentation

## 2015-01-06 DIAGNOSIS — R1110 Vomiting, unspecified: Secondary | ICD-10-CM | POA: Diagnosis present

## 2015-01-06 LAB — URINALYSIS COMPLETE WITH MICROSCOPIC (ARMC ONLY)
BILIRUBIN URINE: NEGATIVE
Bacteria, UA: NONE SEEN
Glucose, UA: NEGATIVE mg/dL
Nitrite: POSITIVE — AB
PH: 6 (ref 5.0–8.0)
Protein, ur: 30 mg/dL — AB
Specific Gravity, Urine: 1.01 (ref 1.005–1.030)

## 2015-01-06 LAB — CBC
HCT: 38.9 % (ref 35.0–47.0)
Hemoglobin: 12.8 g/dL (ref 12.0–16.0)
MCH: 31.2 pg (ref 26.0–34.0)
MCHC: 33 g/dL (ref 32.0–36.0)
MCV: 94.7 fL (ref 80.0–100.0)
PLATELETS: 392 10*3/uL (ref 150–440)
RBC: 4.11 MIL/uL (ref 3.80–5.20)
RDW: 13.5 % (ref 11.5–14.5)
WBC: 13.4 10*3/uL — ABNORMAL HIGH (ref 3.6–11.0)

## 2015-01-06 LAB — COMPREHENSIVE METABOLIC PANEL
ALBUMIN: 2.7 g/dL — AB (ref 3.5–5.0)
ALT: 17 U/L (ref 14–54)
AST: 36 U/L (ref 15–41)
Alkaline Phosphatase: 75 U/L (ref 38–126)
Anion gap: 8 (ref 5–15)
BUN: 10 mg/dL (ref 6–20)
CHLORIDE: 100 mmol/L — AB (ref 101–111)
CO2: 21 mmol/L — AB (ref 22–32)
CREATININE: 0.8 mg/dL (ref 0.44–1.00)
Calcium: 8.5 mg/dL — ABNORMAL LOW (ref 8.9–10.3)
GFR calc Af Amer: >60 mL/min (ref >60)
GFR calc non Af Amer: >60 mL/min (ref >60)
GLUCOSE: 114 mg/dL — AB (ref 65–99)
Potassium: 3.3 mmol/L — ABNORMAL LOW (ref 3.5–5.1)
SODIUM: 129 mmol/L — AB (ref 135–145)
Total Bilirubin: 1 mg/dL (ref 0.3–1.2)
Total Protein: 7.8 g/dL (ref 6.5–8.1)

## 2015-01-06 MED ORDER — OXYCODONE-ACETAMINOPHEN 5-325 MG PO TABS: 1.0000 | ORAL_TABLET | Freq: Four times a day (QID) | ORAL | Status: DC | PRN

## 2015-01-06 MED ORDER — SODIUM CHLORIDE 0.9 % IV BOLUS (SEPSIS)
1000.0000 mL | Freq: Once | INTRAVENOUS | Status: AC
  Administered 2015-01-06: 1000 mL via INTRAVENOUS

## 2015-01-06 MED ORDER — ONDANSETRON HCL 4 MG/2ML IJ SOLN
4.0000 mg | Freq: Once | INTRAMUSCULAR | Status: AC
  Administered 2015-01-06: 4 mg via INTRAVENOUS
  Filled 2015-01-06: qty 2

## 2015-01-06 MED ORDER — MORPHINE SULFATE (PF) 4 MG/ML IV SOLN
4.0000 mg | Freq: Once | INTRAVENOUS | Status: AC
  Administered 2015-01-06: 4 mg via INTRAVENOUS
  Filled 2015-01-06: qty 1

## 2015-01-06 MED ORDER — CEPHALEXIN 500 MG PO CAPS: 500.0000 mg | ORAL_CAPSULE | Freq: Three times a day (TID) | ORAL | Status: DC

## 2015-01-06 MED ORDER — DEXTROSE 5 % IV SOLN
1.0000 g | Freq: Once | INTRAVENOUS | Status: AC
  Administered 2015-01-06: 1 g via INTRAVENOUS
  Filled 2015-01-06: qty 10

## 2015-01-06 NOTE — Discharge Instructions (Signed)
Pyelonephritis, Adult °Pyelonephritis is a kidney infection. A kidney infection can happen quickly, or it can last for a long time. °HOME CARE  °· Take your medicine (antibiotics) as told. Finish it even if you start to feel better. °· Keep all doctor visits as told. °· Drink enough fluids to keep your pee (urine) clear or pale yellow. °· Only take medicine as told by your doctor. °GET HELP RIGHT AWAY IF:  °· You have a fever or lasting symptoms for more than 2-3 days. °· You have a fever and your symptoms suddenly get worse. °· You cannot take your medicine or drink fluids as told. °· You have chills and shaking. °· You feel very weak or pass out (faint). °· You do not feel better after 2 days. °MAKE SURE YOU: °· Understand these instructions. °· Will watch your condition. °· Will get help right away if you are not doing well or get worse. °Document Released: 04/30/2004 Document Revised: 09/22/2011 Document Reviewed: 09/10/2010 °ExitCare® Patient Information ©2015 [employer]. This information is not intended to replace advice given to you by your health care provider. Make sure you discuss any questions you have with your health care provider. ° °

## 2015-01-06 NOTE — ED Provider Notes (Signed)
[facility] Emergency Department Provider Note  Time seen: 2:27 PM  I have reviewed the triage vital signs and the nursing notes.   HISTORY  Chief Complaint Emesis    HPI PARYS ELENBAAS is a 54 y.o. female with a past medical history of gastric reflux, hypertension, presents the emergency department with right flank pain, lower abdominal pain, and foul-smelling urine. According to the patient for the past 4-5 days she's had very foul-smelling urine with lower abdominal pain, she states her last 1-2 days is now progressed to her right back, and somewhat to her left back. Describes her pain as moderate. Associated with nausea and vomiting. Denies diarrhea. Denies vaginal bleeding or discharge. Denies dysuria but states urinary frequency.    Past Medical History  Diagnosis Date  . GERD (gastroesophageal reflux disease)   . Hypertension   . Photosensitivity     (No formal dx of lupus) prev eval Dr. Gavin Potters in [place]  . Pancreatitis     2013 alcohol and/or gallstone  . Cholecystitis   . Alcoholism [facility])     Patient Active Problem List   Diagnosis Date Noted  . Low grade fever 01/01/2015  . Abdominal pain, epigastric 01/01/2015  . Sciatica 10/10/2014  . At high risk for caregiver role strain 06/05/2014  . Routine general medical examination at a health care facility 06/05/2014  . Advance care planning 06/05/2014  . Pain in joint, shoulder region 07/19/2013  . History of alcohol abuse 09/13/2012  . Cholecystitis with cholelithiasis 07/01/2012  . Biliary colic 04/19/2012  . Anxiety 04/10/2012  . Pulmonary nodules 04/10/2012  . Gastritis 03/17/2012  . Pancreatitis, acute 03/13/2012  . Transaminitis 05/17/2011  . Other screening mammogram 05/17/2011  . B12 deficiency 01/31/2010  . ARTHRALGIA 01/31/2010  . Essential hypertension 08/16/2006  . ASTHMA, EXTRINSIC NOS 08/16/2006  . ESOPHAGITIS 08/16/2006  . GERD 08/16/2006    Past Surgical History   Procedure Laterality Date  . Appendectomy      As a child  . Tonsillectomy      As a child  . Nsvd      X 2  . Tubal ligation  Late 20's  . Foot surgery      Pin in small toe  . Doppler echocardiography      Secondary to Redux, wnl  . Dobutamine stress echo  11/00    wnl  . Cat scan  11/00    wnl  . Esophagogastroduodenoscopy  8/01    Esophagitis [name])  . Sbft  08/01    wnl  . 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 Cholecystectomy  04/20/2012    Procedure: LAPAROSCOPIC CHOLECYSTECTOMY WITH INTRAOPERATIVE CHOLANGIOGRAM;  Surgeon: Robyne Askew, MD;  Location: [facility] OR;  Service: General;  Laterality: N/A;    Current Outpatient Rx  Name  Route  Sig  Dispense  Refill  . buPROPion (WELLBUTRIN SR) 150 MG 12 hr tablet   Oral   Take 1 tablet (150 mg total) by mouth daily.   90 tablet   3   . cyanocobalamin (,VITAMIN B-12,) 1000 MCG/ML injection   Intramuscular   Inject 1,000 mcg into the muscle every 6 (six) months.         . cyclobenzaprine (FLEXERIL) 10 MG tablet   Oral   Take 0.5-1 tablets (5-10 mg total) by mouth 3 (three) times daily as needed for muscle spasms (sedation caution). Patient not taking: Reported on 01/01/2015   30 tablet   0   .  metoprolol (LOPRESSOR) 50 MG tablet   Oral   Take 1 tablet (50 mg total) by mouth 2 (two) times daily.   180 tablet   3   . pantoprazole (PROTONIX) 40 MG tablet   Oral   Take 1 tablet (40 mg total) by mouth daily.   90 tablet   3   . promethazine (PHENERGAN) 25 MG tablet      TAKE ONE TABLET EVERY SIX HOURS AS NEEDED FOR NAUSEA   90 tablet   3     Allergies Latex; Lisinopril; Mirtazapine; Nsaids; and Prednisone  Family History  Problem Relation Age of Onset  . Heart disease Mother     CAD, angioplasty twice with stents  . Cancer Mother     Breast  . Breast cancer Mother 83  . Hypertension Father   . Hypothyroidism Father   . Heart disease Father     MI at age 63  . Cancer Father     H/O  renal CA  . Diabetes Sister   . Cancer Sister     Renal CA  . Colon cancer Neg Hx     Social History Social History  Substance Use Topics  . Smoking status: Never Smoker   . Smokeless tobacco: Never Used  . Alcohol Use: Yes     Comment: rare as of 2016    Review of Systems Constitutional: Negative for fever. Cardiovascular: Negative for chest pain. Respiratory: Negative for shortness of breath. Gastrointestinal: Negative for abdominal pain Genitourinary: Negative for dysuria. Positive for urinary frequency. Positive for foul odor. Denies vaginal bleeding or discharge. Musculoskeletal: Positive for lower back pain more so on the right. Neurological: Negative for headache 10-point ROS otherwise negative.  ____________________________________________   PHYSICAL EXAM:  VITAL SIGNS: ED Triage Vitals  Enc Vitals Group     BP 01/06/15 0917 127/75 mmHg     Pulse Rate 01/06/15 0917 108     Resp 01/06/15 0917 18     Temp 01/06/15 0917 99.8 F (37.7 C)     Temp Source 01/06/15 0917 Oral     SpO2 01/06/15 0917 96 %     Weight 01/06/15 0917 189 lb (85.73 kg)     Height 01/06/15 0917 5' (1.524 m)     Head Cir --      Peak Flow --      Pain Score 01/06/15 1205 9     Pain Loc --      Pain Edu? --      Excl. in GC? --     Constitutional: Alert and oriented.  Eyes: Normal exam ENT   Head: Normocephalic and atraumatic   Mouth/Throat: Mucous membranes are moist. Cardiovascular: Normal rate, regular rhythm. No murmur Respiratory: Normal respiratory effort without tachypnea nor retractions. Breath sounds are clear  Gastrointestinal: Soft, mild suprapubic tenderness palpation. No rebound or guarding. No distention. Mild right CVA tenderness palpation. Musculoskeletal: Nontender with normal range of motion in all extremities. Neurologic:  Normal speech and language. No gross focal neurologic deficits Psychiatric: Mood and affect are normal. Speech and behavior are  normal. ____________________________________________    RADIOLOGY  CT shows no acute abnormality.  ____________________________________________    INITIAL IMPRESSION / ASSESSMENT AND PLAN / ED COURSE  Pertinent labs & imaging results that were available during my care of the patient were reviewed by me and considered in my medical decision making (see chart for details).  Labs are most consistent with urinary tract infection given her right CVA tenderness  likely pyelonephritis. Dose IV Rocephin, morphine, Zofran, IV fluids. We will also obtain a CT renal scan.   CT shows no acute abnormality. We'll discharge on antibiotics. I have sent a urine culture for likely pyelonephritis. Patient agreeable to plan. She states she feels considerably better at this time.  ____________________________________________   FINAL CLINICAL IMPRESSION(S) / ED DIAGNOSES  Pyelonephritis   Minna Antis, MD 01/06/15 1440

## 2015-01-06 NOTE — ED Notes (Signed)
Pt states she began having right flank pain a few days ago, now RLQ with vomiting and pain is in lower back bilaterally. Does not have appendix or gallbladder.

## 2015-01-08 LAB — URINE CULTURE

## 2015-01-22 ENCOUNTER — Ambulatory Visit (INDEPENDENT_AMBULATORY_CARE_PROVIDER_SITE_OTHER): Payer: BLUE CROSS/BLUE SHIELD | Admitting: *Deleted

## 2015-01-22 ENCOUNTER — Encounter: Payer: Self-pay | Admitting: Family Medicine

## 2015-01-22 DIAGNOSIS — E538 Deficiency of other specified B group vitamins: Secondary | ICD-10-CM

## 2015-01-22 MED ORDER — CYANOCOBALAMIN 1000 MCG/ML IJ SOLN
1000.0000 ug | Freq: Once | INTRAMUSCULAR | Status: AC
  Administered 2015-01-22: 1000 ug via INTRAMUSCULAR

## 2015-01-23 ENCOUNTER — Other Ambulatory Visit: Payer: Self-pay | Admitting: Family Medicine

## 2015-01-23 DIAGNOSIS — E538 Deficiency of other specified B group vitamins: Secondary | ICD-10-CM

## 2015-01-23 LAB — VITAMIN B12: VITAMIN B 12: 997 pg/mL — AB (ref 211–911)

## 2015-01-23 MED ORDER — CYANOCOBALAMIN 1000 MCG/ML IJ SOLN: 1000.0000 ug | INTRAMUSCULAR | Status: DC

## 2015-02-26 ENCOUNTER — Ambulatory Visit: Payer: BLUE CROSS/BLUE SHIELD

## 2015-03-14 ENCOUNTER — Other Ambulatory Visit: Payer: Self-pay | Admitting: *Deleted

## 2015-03-15 NOTE — Telephone Encounter (Signed)
Pt called to see why refill of wellbutrin was denied; spoke with Dawn at [facility] and pt got filled 06/06/14; 08/13/14; 10/25/14 and 01/07/15. Pt got refills early; pt states she does not know why she does not have med because she only takes one daily. Advised pt would send note to Dr Para Marchuncan and it would be next week with cb. Pt voiced understanding.

## 2015-03-17 NOTE — Telephone Encounter (Signed)
I printed off rx on 06/04/14; she had OV that day.  rx was printed for patient.  She should have a year's worth on that rx.   Did [facility] to [facility]?  If so, should have rx for 1 day until 06/05/15.  Why did she need to fill early if only taking one a day?  Did she lose the rx/pills? Let me know if I need to send again.  To clarify- I did the rx and she should have been good to go with that rx until next year.

## 2015-03-18 MED ORDER — BUPROPION HCL ER (SR) 150 MG PO TB12: 150.0000 mg | ORAL_TABLET | Freq: Every day | ORAL | Status: DC

## 2015-03-18 NOTE — Addendum Note (Signed)
Addended by: Joaquim NamUNCAN, Mckena Chern S on: 03/18/2015 01:30 PM   Modules accepted: Orders

## 2015-03-18 NOTE — Telephone Encounter (Signed)
Left detailed message on voicemail.  

## 2015-03-18 NOTE — Telephone Encounter (Signed)
Sent. Thanks.   

## 2015-03-18 NOTE — Telephone Encounter (Signed)
Patient states that Rx was printed for mail order but she decided not to use mail order and the Rx was destroyed.  Therefore, she needs this 1 RF and then she will be even with all of her other prescriptions.  Please advise.

## 2015-03-21 ENCOUNTER — Emergency Department
Admission: EM | Admit: 2015-03-21 | Discharge: 2015-03-21 | Disposition: A | Discharge status: Home / Self Care | Payer: BLUE CROSS/BLUE SHIELD | Attending: Emergency Medicine | Admitting: Emergency Medicine

## 2015-03-21 ENCOUNTER — Emergency Department: Payer: BLUE CROSS/BLUE SHIELD

## 2015-03-21 ENCOUNTER — Encounter: Payer: Self-pay | Admitting: Emergency Medicine

## 2015-03-21 DIAGNOSIS — Z79899 Other long term (current) drug therapy: Secondary | ICD-10-CM | POA: Diagnosis not present

## 2015-03-21 DIAGNOSIS — K852 Alcohol induced acute pancreatitis without necrosis or infection: Secondary | ICD-10-CM | POA: Diagnosis not present

## 2015-03-21 DIAGNOSIS — Z792 Long term (current) use of antibiotics: Secondary | ICD-10-CM | POA: Insufficient documentation

## 2015-03-21 DIAGNOSIS — F1020 Alcohol dependence, uncomplicated: Secondary | ICD-10-CM | POA: Diagnosis not present

## 2015-03-21 DIAGNOSIS — Z91040 Latex allergy status: Secondary | ICD-10-CM | POA: Insufficient documentation

## 2015-03-21 DIAGNOSIS — I10 Essential (primary) hypertension: Secondary | ICD-10-CM | POA: Diagnosis not present

## 2015-03-21 DIAGNOSIS — K859 Acute pancreatitis, unspecified: Secondary | ICD-10-CM

## 2015-03-21 DIAGNOSIS — R1013 Epigastric pain: Secondary | ICD-10-CM | POA: Diagnosis present

## 2015-03-21 LAB — COMPREHENSIVE METABOLIC PANEL
ALBUMIN: 4.4 g/dL (ref 3.5–5.0)
ALT: 27 U/L (ref 14–54)
AST: 48 U/L — AB (ref 15–41)
Alkaline Phosphatase: 71 U/L (ref 38–126)
Anion gap: 25 — ABNORMAL HIGH (ref 5–15)
BUN: 9 mg/dL (ref 6–20)
CHLORIDE: 99 mmol/L — AB (ref 101–111)
CO2: 13 mmol/L — ABNORMAL LOW (ref 22–32)
CREATININE: 0.55 mg/dL (ref 0.44–1.00)
Calcium: 9.4 mg/dL (ref 8.9–10.3)
GFR calc Af Amer: >60 mL/min (ref >60)
Glucose, Bld: 84 mg/dL (ref 65–99)
POTASSIUM: 4.3 mmol/L (ref 3.5–5.1)
Sodium: 137 mmol/L (ref 135–145)
Total Bilirubin: 1.5 mg/dL — ABNORMAL HIGH (ref 0.3–1.2)
Total Protein: 8 g/dL (ref 6.5–8.1)

## 2015-03-21 LAB — CBC WITH DIFFERENTIAL/PLATELET
BASOS ABS: 0 10*3/uL (ref 0–0.1)
BASOS PCT: 0 %
EOS ABS: 0 10*3/uL (ref 0–0.7)
EOS PCT: 0 %
HCT: 45.2 % (ref 35.0–47.0)
Hemoglobin: 15 g/dL (ref 12.0–16.0)
Lymphocytes Relative: 7 %
Lymphs Abs: 0.8 10*3/uL — ABNORMAL LOW (ref 1.0–3.6)
MCH: 31.9 pg (ref 26.0–34.0)
MCHC: 33.1 g/dL (ref 32.0–36.0)
MCV: 96.3 fL (ref 80.0–100.0)
MONO ABS: 0.4 10*3/uL (ref 0.2–0.9)
Monocytes Relative: 3 %
Neutro Abs: 10.9 10*3/uL — ABNORMAL HIGH (ref 1.4–6.5)
Neutrophils Relative %: 90 %
PLATELETS: 320 10*3/uL (ref 150–440)
RBC: 4.69 MIL/uL (ref 3.80–5.20)
RDW: 14.6 % — ABNORMAL HIGH (ref 11.5–14.5)
WBC: 12.1 10*3/uL — AB (ref 3.6–11.0)

## 2015-03-21 LAB — LIPASE, BLOOD: LIPASE: 394 U/L — AB (ref 11–51)

## 2015-03-21 LAB — ETHANOL: Alcohol, Ethyl (B): 12 mg/dL — ABNORMAL HIGH (ref <5)

## 2015-03-21 MED ORDER — HYDROMORPHONE HCL 1 MG/ML IJ SOLN
2.0000 mg | Freq: Once | INTRAMUSCULAR | Status: AC
  Administered 2015-03-21: 2 mg via INTRAVENOUS
  Filled 2015-03-21: qty 2

## 2015-03-21 MED ORDER — PROMETHAZINE HCL 12.5 MG PO TABS: 12.5000 mg | ORAL_TABLET | Freq: Four times a day (QID) | ORAL | Status: DC | PRN

## 2015-03-21 MED ORDER — ONDANSETRON HCL 4 MG/2ML IJ SOLN
4.0000 mg | Freq: Once | INTRAMUSCULAR | Status: AC
  Administered 2015-03-21: 4 mg via INTRAVENOUS
  Filled 2015-03-21: qty 2

## 2015-03-21 MED ORDER — HYDROMORPHONE HCL 2 MG PO TABS
2.0000 mg | ORAL_TABLET | Freq: Once | ORAL | Status: AC
  Administered 2015-03-21: 2 mg via ORAL
  Filled 2015-03-21: qty 1

## 2015-03-21 MED ORDER — HYDROMORPHONE HCL 2 MG PO TABS
2.0000 mg | ORAL_TABLET | Freq: Two times a day (BID) | ORAL | Status: DC | PRN
Start: 2015-03-21 — End: 2015-03-26

## 2015-03-21 MED ORDER — SODIUM CHLORIDE 0.9 % IV BOLUS (SEPSIS)
1000.0000 mL | Freq: Once | INTRAVENOUS | Status: AC
  Administered 2015-03-21: 1000 mL via INTRAVENOUS

## 2015-03-21 NOTE — ED Notes (Addendum)
Patient states she is not driving, she had a friend drop her off.  She understands that if she is to receive narcotics for pain relief, she is not to drive herself home tonight.

## 2015-03-21 NOTE — Discharge Instructions (Signed)
Acute Pancreatitis Acute pancreatitis is a disease in which the pancreas becomes suddenly inflamed. The pancreas is a large gland located behind your stomach. The pancreas produces enzymes that help digest food. The pancreas also releases the hormones glucagon and insulin that help regulate blood sugar. Damage to the pancreas occurs when the digestive enzymes from the pancreas are activated and begin attacking the pancreas before being released into the intestine. Most acute attacks last a couple of days and can cause serious complications. Some people become dehydrated and develop low blood pressure. In severe cases, bleeding into the pancreas can lead to shock and can be life-threatening. The lungs, heart, and kidneys may fail. CAUSES  Pancreatitis can happen to anyone. In some cases, the cause is unknown. Most cases are caused by:  Alcohol abuse.  Gallstones. Other less common causes are:  Certain medicines.  Exposure to certain chemicals.  Infection.  Damage caused by an accident (trauma).  Abdominal surgery. SYMPTOMS   Pain in the upper abdomen that may radiate to the back.  Tenderness and swelling of the abdomen.  Nausea and vomiting. DIAGNOSIS  Your caregiver will perform a physical exam. Blood and stool tests may be done to confirm the diagnosis. Imaging tests may also be done, such as X-rays, CT scans, or an ultrasound of the abdomen. TREATMENT  Treatment usually requires a stay in the hospital. Treatment may include:  Pain medicine.  Fluid replacement through an intravenous line (IV).  Placing a tube in the stomach to remove stomach contents and control vomiting.  Not eating for 3 or 4 days. This gives your pancreas a rest, because enzymes are not being produced that can cause further damage.  Antibiotic medicines if your condition is caused by an infection.  Surgery of the pancreas or gallbladder. HOME CARE INSTRUCTIONS   Follow the diet advised by your  caregiver. This may involve avoiding alcohol and decreasing the amount of fat in your diet.  Eat smaller, more frequent meals. This reduces the amount of digestive juices the pancreas produces.  Drink enough fluids to keep your urine clear or pale yellow.  Only take over-the-counter or prescription medicines as directed by your caregiver.  Avoid drinking alcohol if it caused your condition.  Do not smoke.  Get plenty of rest.  Check your blood sugar at home as directed by your caregiver.  Keep all follow-up appointments as directed by your caregiver. SEEK MEDICAL CARE IF:   You do not recover as quickly as expected.  You develop new or worsening symptoms.  You have persistent pain, weakness, or nausea.  You recover and then have another episode of pain. SEEK IMMEDIATE MEDICAL CARE IF:   You are unable to eat or keep fluids down.  Your pain becomes severe.  You have a fever or persistent symptoms for more than 2 to 3 days.  You have a fever and your symptoms suddenly get worse.  Your skin or the white part of your eyes turn yellow (jaundice).  You develop vomiting.  You feel dizzy, or you faint.  Your blood sugar is high (over 300 mg/dL). MAKE SURE YOU:   Understand these instructions.  Will watch your condition.  Will get help right away if you are not doing well or get worse.   This information is not intended to replace advice given to you by your health care provider. Make sure you discuss any questions you have with your health care provider.   Document Released: 03/23/2005 Document Revised: 09/22/2011  Document Reviewed: 07/02/2011 Elsevier Interactive Patient Education [employer]   Please return immediately if condition worsens. Please contact her primary physician or the physician you were given for referral. If you have any specialist physicians involved in her treatment and plan please also contact them. Thank you for using [facility].  Acute Pancreatitis Acute pancreatitis is a disease in which the pancreas becomes suddenly inflamed. The pancreas is a large gland located behind your stomach. The pancreas produces enzymes that help digest food. The pancreas also releases the hormones glucagon and insulin that help regulate blood sugar. Damage to the pancreas occurs when the digestive enzymes from the pancreas are activated and begin attacking the pancreas before being released into the intestine. Most acute attacks last a couple of days and can cause serious complications. Some people become dehydrated and develop low blood pressure. In severe cases, bleeding into the pancreas can lead to shock and can be life-threatening. The lungs, heart, and kidneys may fail. CAUSES  Pancreatitis can happen to anyone. In some cases, the cause is unknown. Most cases are caused by:  Alcohol abuse.  Gallstones. Other less common causes are:  Certain medicines.  Exposure to certain chemicals.  Infection.  Damage caused by an accident (trauma).  Abdominal surgery. SYMPTOMS   Pain in the upper abdomen that may radiate to the back.  Tenderness and swelling of the abdomen.  Nausea and vomiting. DIAGNOSIS  Your caregiver will perform a physical exam. Blood and stool tests may be done to confirm the diagnosis. Imaging tests may also be done, such as X-rays, CT scans, or an ultrasound of the abdomen. TREATMENT  Treatment usually requires a stay in the hospital. Treatment may include:  Pain medicine.  Fluid replacement through an intravenous line (IV).  Placing a tube in the stomach to remove stomach contents and control vomiting.  Not eating for 3 or 4 days. This gives your pancreas a rest, because enzymes are not being produced that can cause further damage.  Antibiotic medicines if your condition is caused by an infection.  Surgery of the pancreas or gallbladder. HOME CARE INSTRUCTIONS   Follow the diet  advised by your caregiver. This may involve avoiding alcohol and decreasing the amount of fat in your diet.  Eat smaller, more frequent meals. This reduces the amount of digestive juices the pancreas produces.  Drink enough fluids to keep your urine clear or pale yellow.  Only take over-the-counter or prescription medicines as directed by your caregiver.  Avoid drinking alcohol if it caused your condition.  Do not smoke.  Get plenty of rest.  Check your blood sugar at home as directed by your caregiver.  Keep all follow-up appointments as directed by your caregiver. SEEK MEDICAL CARE IF:   You do not recover as quickly as expected.  You develop new or worsening symptoms.  You have persistent pain, weakness, or nausea.  You recover and then have another episode of pain. SEEK IMMEDIATE MEDICAL CARE IF:   You are unable to eat or keep fluids down.  Your pain becomes severe.  You have a fever or persistent symptoms for more than 2 to 3 days.  You have a fever and your symptoms suddenly get worse.  Your skin or the white part of your eyes turn yellow (jaundice).  You develop vomiting.  You feel dizzy, or you faint.  Your blood sugar is high (over 300 mg/dL). MAKE SURE YOU:   Understand these instructions.  Will  watch your condition.  Will get help right away if you are not doing well or get worse.   This information is not intended to replace advice given to you by your health care provider. Make sure you discuss any questions you have with your health care provider.   Document Released: 03/23/2005 Document Revised: 09/22/2011 Document Reviewed: 07/02/2011 Elsevier Interactive Patient Education [employer]   Please return immediately if condition worsens. Please contact her primary physician or the physician you were given for referral. If you have any specialist physicians involved in her treatment and plan please also contact them. Thank you for using  [facility]. Return to emergency department especially for a high fever, uncontrolled pain, or uncontrolled vomiting. You can also take over-the-counter Tylenol for discomfort but avoid all nonsteroidal products like ibuprofen.

## 2015-03-21 NOTE — ED Notes (Signed)
Patient states she has a ride that is 5 minutes out.

## 2015-03-21 NOTE — ED Notes (Signed)
Patient transported to X-ray 

## 2015-03-21 NOTE — ED Notes (Signed)
Pt with hx of pancreatitis and drank some alcohol yesterday and now presenting with flare up.

## 2015-03-21 NOTE — ED Provider Notes (Signed)
Time Seen: Approximately ----------------------------------------- 2:05 PM on 03/21/2015 -----------------------------------------   I have reviewed the triage notes  Chief Complaint: Pancreatitis   History of Present Illness: Abigail [name] is a 54 y.o. female who states of previous history of alcohol related pancreatitis. Patient states she doesn't drink as often as she used to but did have some drinks last night. Patient states she had multiple mixed drinks along with 2 beers and shortly thereafter developed increasing epigastric abdominal pain. She states the intensity, location, and characteristics seem to be consistent with a previous history of pancreatitis. Patient's had previous surgical history of a ectomy and appendectomy along with gastric bypass surgery.   Past Medical History  Diagnosis Date  . GERD (gastroesophageal reflux disease)   . Hypertension   . Photosensitivity     (No formal dx of lupus) prev eval Dr. Gavin Potters in [place]  . Pancreatitis     2013 alcohol and/or gallstone  . Cholecystitis   . Alcoholism [facility])     Patient Active Problem List   Diagnosis Date Noted  . Low grade fever 01/01/2015  . Abdominal pain, epigastric 01/01/2015  . Sciatica 10/10/2014  . At high risk for caregiver role strain 06/05/2014  . Routine general medical examination at a health care facility 06/05/2014  . Advance care planning 06/05/2014  . Pain in joint, shoulder region 07/19/2013  . History of alcohol abuse 09/13/2012  . Cholecystitis with cholelithiasis 07/01/2012  . Biliary colic 04/19/2012  . Anxiety 04/10/2012  . Pulmonary nodules 04/10/2012  . Gastritis 03/17/2012  . Pancreatitis, acute 03/13/2012  . Transaminitis 05/17/2011  . Other screening mammogram 05/17/2011  . B12 deficiency 01/31/2010  . ARTHRALGIA 01/31/2010  . Essential hypertension 08/16/2006  . ASTHMA, EXTRINSIC NOS 08/16/2006  . ESOPHAGITIS 08/16/2006  . GERD 08/16/2006    Past Surgical  History  Procedure Laterality Date  . Appendectomy      As a child  . Tonsillectomy      As a child  . Nsvd      X 2  . Tubal ligation  Late 20's  . Foot surgery      Pin in small toe  . Doppler echocardiography      Secondary to Redux, wnl  . Dobutamine stress echo  11/00    wnl  . Cat scan  11/00    wnl  . Esophagogastroduodenoscopy  8/01    Esophagitis [name])  . Sbft  08/01    wnl  . 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 Cholecystectomy  04/20/2012    Procedure: LAPAROSCOPIC CHOLECYSTECTOMY WITH INTRAOPERATIVE CHOLANGIOGRAM;  Surgeon: Robyne Askew, MD;  Location: [facility] OR;  Service: General;  Laterality: N/A;    Past Surgical History  Procedure Laterality Date  . Appendectomy      As a child  . Tonsillectomy      As a child  . Nsvd      X 2  . Tubal ligation  Late 20's  . Foot surgery      Pin in small toe  . Doppler echocardiography      Secondary to Redux, wnl  . Dobutamine stress echo  11/00    wnl  . Cat scan  11/00    wnl  . Esophagogastroduodenoscopy  8/01    Esophagitis [name])  . Sbft  08/01    wnl  . 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 Cholecystectomy  04/20/2012    Procedure: LAPAROSCOPIC CHOLECYSTECTOMY WITH INTRAOPERATIVE  CHOLANGIOGRAM;  Surgeon: Robyne Askew, MD;  Location: [facility] OR;  Service: General;  Laterality: N/A;    Current Outpatient Rx  Name  Route  Sig  Dispense  Refill  . buPROPion (WELLBUTRIN SR) 150 MG 12 hr tablet   Oral   Take 1 tablet (150 mg total) by mouth daily.   90 tablet   1   . cephALEXin (KEFLEX) 500 MG capsule   Oral   Take 1 capsule (500 mg total) by mouth 3 (three) times daily.   30 capsule   0   . cyanocobalamin (,VITAMIN B-12,) 1000 MCG/ML injection   Intramuscular   Inject 1 mL (1,000 mcg total) into the muscle every 8 (eight) weeks.   1 mL      . cyclobenzaprine (FLEXERIL) 10 MG tablet   Oral   Take 0.5-1 tablets (5-10 mg total) by mouth 3 (three) times daily as needed  for muscle spasms (sedation caution). Patient not taking: Reported on 01/01/2015   30 tablet   0   . metoprolol (LOPRESSOR) 50 MG tablet   Oral   Take 1 tablet (50 mg total) by mouth 2 (two) times daily.   180 tablet   3   . oxyCODONE-acetaminophen (ROXICET) 5-325 MG tablet   Oral   Take 1 tablet by mouth every 6 (six) hours as needed.   20 tablet   0   . pantoprazole (PROTONIX) 40 MG tablet   Oral   Take 1 tablet (40 mg total) by mouth daily.   90 tablet   3   . promethazine (PHENERGAN) 25 MG tablet      TAKE ONE TABLET EVERY SIX HOURS AS NEEDED FOR NAUSEA   90 tablet   3     Allergies:  Latex; Lisinopril; Mirtazapine; Nsaids; and Prednisone  Family History: Family History  Problem Relation Age of Onset  . Heart disease Mother     CAD, angioplasty twice with stents  . Cancer Mother     Breast  . Breast cancer Mother 74  . Hypertension Father   . Hypothyroidism Father   . Heart disease Father     MI at age 3  . Cancer Father     H/O renal CA  . Diabetes Sister   . Cancer Sister     Renal CA  . Colon cancer Neg Hx     Social History: Social History  Substance Use Topics  . Smoking status: Never Smoker   . Smokeless tobacco: Never Used  . Alcohol Use: Yes     Comment: rare as of 2016     Review of Systems:   10 point review of systems was performed and was otherwise negative:  Constitutional: No fever Eyes: No visual disturbances ENT: No sore throat, ear pain Cardiac: No chest pain Respiratory: No shortness of breath, wheezing, or stridor Abdomen: No abdominal pain she states she's vomited multiple times with no blood or bile, No diarrhea. Patient denies any melena or hematochezia. Endocrine: No weight loss, No night sweats Extremities: No peripheral edema, cyanosis Skin: No rashes, easy bruising Neurologic: No focal weakness, trouble with speech or swollowing Urologic: No dysuria, Hematuria, or urinary frequency   Physical Exam:  ED  Triage Vitals  Enc Vitals Group     BP 03/21/15 1302 134/74 mmHg     Pulse Rate 03/21/15 1302 84     Resp 03/21/15 1302 20     Temp 03/21/15 1302 97.5 F (36.4 C)  Temp Source 03/21/15 1302 Oral     SpO2 03/21/15 1302 100 %     Weight 03/21/15 1302 160 lb (72.576 kg)     Height 03/21/15 1302 5' (1.524 m)     Head Cir --      Peak Flow --      Pain Score 03/21/15 1304 10     Pain Loc --      Pain Edu? --      Excl. in GC? --     General: Awake , Alert , and Oriented times 3; GCS 15 Head: Normal cephalic , atraumatic Eyes: Pupils equal , round, reactive to light Nose/Throat: No nasal drainage, patent upper airway without erythema or exudate.  Neck: Supple, Full range of motion, No anterior adenopathy or palpable thyroid masses Lungs: Clear to ascultation without wheezes , rhonchi, or rales Heart: Regular rate, regular rhythm without murmurs , gallops , or rubs Abdomen: Point tender primarily at the epigastric region without any rebound, guarding, or rigidity. No reproducible lower abdominal pain or abdominal distention. Bowel sounds are diminished symmetric in all 4 quadrants.       Extremities: 2 plus symmetric pulses. No edema, clubbing or cyanosis Neurologic: normal ambulation, Motor symmetric without deficits, sensory intact Skin: warm, dry, no rashes   Labs:   All laboratory work was reviewed including any pertinent negatives or positives listed below:  Labs Reviewed  CBC WITH DIFFERENTIAL/PLATELET - Abnormal; Notable for the following:    WBC 12.1 (*)    RDW 14.6 (*)    Neutro Abs 10.9 (*)    Lymphs Abs 0.8 (*)    All other components within normal limits  COMPREHENSIVE METABOLIC PANEL  LIPASE, BLOOD  ETHANOL   the lipase level is significantly elevated with slightly elevated white blood cell count, the patient has some slight acidosis.    Radiology:    Normal heart size. Normal mediastinal contour. No pneumothorax. No pleural effusion. Clear lungs, with no  focal lung consolidation and no pulmonary edema. Cholecystectomy clips are again noted in the right upper quadrant of the abdomen. Surgical clips are again noted in the left upper quadrant of the abdomen from gastric bypass surgery. Surgical sutures are seen in the left abdomen. There is a solitary mildly dilated small bowel loop in the left upper quadrant of the abdomen. No additional dilated small bowel loops or air-fluid levels. Mild stool throughout the colon. No evidence of pneumatosis or pneumoperitoneum. No pathologic soft tissue calcifications. Mild degenerative changes in the lumbar spine.  IMPRESSION: 1. No active disease in the chest. 2. Solitary mildly dilated proximal small bowel loop in the left upper quadrant of the abdomen, favor a mild adynamic ileus. Recommend correlation with serum lipase, as this could represent a sentinel focal ileus from acute pancreatitis.     I personally reviewed the radiologic studies   *    ED Course: Patient's stay here was uneventful. She had symptomatic improvement and was able tolerate oral fluids here in emergency department and both parties agree that we felt we can manage her pancreatitis on an outpatient basis. She was prescribed Dilaudid and given a prescription for Phenergan on an outpatient basis and advised to return here if she develops a fever, uncontrolled pain, uncontrolled vomiting. She felt better after a liter of fluid with some mild dehydration.    Assessment: Acute alcohol related pancreatitis   F   Plan:  Outpatient management Patient was advised to return immediately if condition worsens. Patient was  advised to follow up with their primary care physician or other specialized physicians involved in their outpatient care            Jennye MoccasinBrian S Quigley, MD 03/21/15 1659

## 2015-03-26 ENCOUNTER — Ambulatory Visit: Payer: BLUE CROSS/BLUE SHIELD

## 2015-03-26 ENCOUNTER — Encounter: Payer: Self-pay | Admitting: Family Medicine

## 2015-03-26 ENCOUNTER — Ambulatory Visit (INDEPENDENT_AMBULATORY_CARE_PROVIDER_SITE_OTHER): Payer: BLUE CROSS/BLUE SHIELD | Admitting: Family Medicine

## 2015-03-26 VITALS — BP 130/84 | HR 69 | Temp 98.6°F | Wt 182.5 lb

## 2015-03-26 DIAGNOSIS — E538 Deficiency of other specified B group vitamins: Secondary | ICD-10-CM | POA: Diagnosis not present

## 2015-03-26 DIAGNOSIS — K859 Acute pancreatitis without necrosis or infection, unspecified: Secondary | ICD-10-CM | POA: Diagnosis not present

## 2015-03-26 LAB — CBC WITH DIFFERENTIAL/PLATELET
BASOS ABS: 0 10*3/uL (ref 0.0–0.1)
Basophils Relative: 0.4 % (ref 0.0–3.0)
EOS ABS: 0.3 10*3/uL (ref 0.0–0.7)
Eosinophils Relative: 4.7 % (ref 0.0–5.0)
HEMATOCRIT: 38.6 % (ref 36.0–46.0)
Hemoglobin: 13 g/dL (ref 12.0–15.0)
LYMPHS ABS: 1.7 10*3/uL (ref 0.7–4.0)
Lymphocytes Relative: 29.9 % (ref 12.0–46.0)
MCHC: 33.6 g/dL (ref 30.0–36.0)
MCV: 96.7 fl (ref 78.0–100.0)
Monocytes Absolute: 0.7 10*3/uL (ref 0.1–1.0)
Monocytes Relative: 12.2 % — ABNORMAL HIGH (ref 3.0–12.0)
NEUTROS ABS: 3 10*3/uL (ref 1.4–7.7)
NEUTROS PCT: 52.8 % (ref 43.0–77.0)
PLATELETS: 268 10*3/uL (ref 150.0–400.0)
RBC: 3.99 Mil/uL (ref 3.87–5.11)
RDW: 14.1 % (ref 11.5–15.5)
WBC: 5.6 10*3/uL (ref 4.0–10.5)

## 2015-03-26 LAB — COMPREHENSIVE METABOLIC PANEL
ALT: 18 U/L (ref 0–35)
AST: 25 U/L (ref 0–37)
Albumin: 3.6 g/dL (ref 3.5–5.2)
Alkaline Phosphatase: 54 U/L (ref 39–117)
BILIRUBIN TOTAL: 0.3 mg/dL (ref 0.2–1.2)
BUN: 10 mg/dL (ref 6–23)
CO2: 27 meq/L (ref 19–32)
CREATININE: 0.62 mg/dL (ref 0.40–1.20)
Calcium: 9.3 mg/dL (ref 8.4–10.5)
Chloride: 106 mEq/L (ref 96–112)
GFR: 107.4 mL/min (ref >60.00)
GLUCOSE: 87 mg/dL (ref 70–99)
Potassium: 4 mEq/L (ref 3.5–5.1)
Sodium: 140 mEq/L (ref 135–145)
Total Protein: 6.9 g/dL (ref 6.0–8.3)

## 2015-03-26 LAB — LIPASE: Lipase: 83 U/L — ABNORMAL HIGH (ref 11.0–59.0)

## 2015-03-26 MED ORDER — CYANOCOBALAMIN 1000 MCG/ML IJ SOLN
1000.0000 ug | Freq: Once | INTRAMUSCULAR | Status: AC
  Administered 2015-03-26: 1000 ug via INTRAMUSCULAR

## 2015-03-26 MED ORDER — OXYCODONE-ACETAMINOPHEN 5-325 MG PO TABS: 1.0000 | ORAL_TABLET | Freq: Four times a day (QID) | ORAL | Status: DC | PRN

## 2015-03-26 NOTE — Progress Notes (Signed)
Pre visit review using our clinic review tool, if applicable. No additional management support is needed unless otherwise documented below in the visit note.  She was drinking before the abd pain started.  To ER with pancreatitis.  Itched w/o rash with dilaudid.  Tolerates oxycodone.   We talked about etoh avoidance.  She had gone to therapy prev, group therapy.  She has gone to residential tx prev.  She wanted to go to individual counseling.  I told her I would work on that.   [place]  D/w pt about her meds and f/u labs.    Still with some discomfort, epigastric, but much improved from prev.  Able to eat some, "but I'm keeping it light."    Meds, vitals, and allergies reviewed.   ROS: See HPI.  Otherwise, noncontributory.  nad ncat Neck supple, no LA rrr ctab epigastrum slightly ttp w/o rebound. Normal BS Speech wnl Judgement intact

## 2015-03-26 NOTE — Patient Instructions (Signed)
Taper back to clear liquids if needed.  Gradually advance your diet.  Avoid fatty and sugary foods for now.   Use the pain meds if needed.   I would cease all alcohol.  I'll check on counseling.

## 2015-03-27 NOTE — Assessment & Plan Note (Signed)
Much improved.  I told her I didn't know how much she could ever safely drink, but at this point no etoh was safest option.  She agreed.  See notes on labs.   Okay for outpatient f/u.  Continue oxycodone prn for now.   gradually ADAT.  D/w pt.   >25 minutes spent in face to face time with patient, >50% spent in counselling or coordination of care.  I'll check on counseling referral, this is useful but isn't emergent; she agrees.

## 2015-03-28 ENCOUNTER — Ambulatory Visit: Payer: BLUE CROSS/BLUE SHIELD

## 2015-04-01 ENCOUNTER — Other Ambulatory Visit: Payer: Self-pay | Admitting: Family Medicine

## 2015-04-01 DIAGNOSIS — F1011 Alcohol abuse, in remission: Secondary | ICD-10-CM

## 2015-04-01 NOTE — Progress Notes (Signed)
Referral to counseling made.  Notify pt.  She may end up having to call about the appointment.  FYI to patient.  Thanks.

## 2015-04-02 ENCOUNTER — Other Ambulatory Visit: Payer: Self-pay | Admitting: *Deleted

## 2015-04-02 MED ORDER — PANTOPRAZOLE SODIUM 40 MG PO TBEC: 40.0000 mg | DELAYED_RELEASE_TABLET | Freq: Every day | ORAL | Status: DC

## 2015-04-02 NOTE — Progress Notes (Signed)
Left detailed message on voicemail.  

## 2015-04-03 ENCOUNTER — Encounter: Payer: Self-pay | Admitting: *Deleted

## 2015-04-07 HISTORY — PX: FOOT SURGERY: SHX648

## 2015-04-16 ENCOUNTER — Ambulatory Visit (INDEPENDENT_AMBULATORY_CARE_PROVIDER_SITE_OTHER): Payer: BLUE CROSS/BLUE SHIELD | Admitting: Sports Medicine

## 2015-04-16 ENCOUNTER — Ambulatory Visit (INDEPENDENT_AMBULATORY_CARE_PROVIDER_SITE_OTHER): Payer: BLUE CROSS/BLUE SHIELD

## 2015-04-16 ENCOUNTER — Encounter: Payer: Self-pay | Admitting: Sports Medicine

## 2015-04-16 DIAGNOSIS — M79672 Pain in left foot: Secondary | ICD-10-CM

## 2015-04-16 DIAGNOSIS — M21619 Bunion of unspecified foot: Secondary | ICD-10-CM | POA: Diagnosis not present

## 2015-04-16 NOTE — Progress Notes (Deleted)
   Subjective:    Patient ID: Abigail [name], female    DOB: 09/20/1960, 55 y.o.   MRN: [id number]  HPI    Review of Systems  All other systems reviewed and are negative.      Objective:   Physical Exam        Assessment & Plan:

## 2015-04-16 NOTE — Progress Notes (Signed)
Patient ID: Abigail [name], female   DOB: Aug 18, 1960, 55 y.o.   MRN: [id number] Subjective: Abigail [name] is a 55 y.o. female patient who presents to office for evaluation of Left bunion pain. Patient complains of progressive pain especially over the last year but has been dealing with it for 5 years in the Left foot that starts as pain over the bump with direct pressure and range of motion; patient now has difficulty fitting shoes comfortably.States that she also has throbbing and numbness over the bump.  Patient has also tried soaks and wax with a little relief and Ibuprofen with some relief but had to stop taking it because of stomach ulcer. Patient denies any other pedal complaints.   Patient Active Problem List   Diagnosis Date Noted  . Low grade fever 01/01/2015  . Abdominal pain, epigastric 01/01/2015  . Sciatica 10/10/2014  . At high risk for caregiver role strain 06/05/2014  . Routine general medical examination at a health care facility 06/05/2014  . Advance care planning 06/05/2014  . Pain in joint, shoulder region 07/19/2013  . History of alcohol abuse 09/13/2012  . Cholecystitis with cholelithiasis 07/01/2012  . Biliary colic 13/14/3888  . Anxiety 04/10/2012  . Pulmonary nodules 04/10/2012  . Gastritis 03/17/2012  . Pancreatitis, acute 03/13/2012  . Transaminitis 05/17/2011  . Other screening mammogram 05/17/2011  . B12 deficiency 01/31/2010  . ARTHRALGIA 01/31/2010  . Essential hypertension 08/16/2006  . ASTHMA, EXTRINSIC NOS 08/16/2006  . ESOPHAGITIS 08/16/2006  . GERD 08/16/2006   Current Outpatient Prescriptions on File Prior to Visit  Medication Sig Dispense Refill  . buPROPion (WELLBUTRIN SR) 150 MG 12 hr tablet Take 1 tablet (150 mg total) by mouth daily. 90 tablet 1  . metoprolol (LOPRESSOR) 50 MG tablet Take 1 tablet (50 mg total) by mouth 2 (two) times daily. 180 tablet 3  . pantoprazole (PROTONIX) 40 MG tablet Take 1 tablet (40 mg total) by mouth daily. 90  tablet 3  . promethazine (PHENERGAN) 25 MG tablet Take 25 mg by mouth every 6 (six) hours as needed for nausea or vomiting.     No current facility-administered medications on file prior to visit.   Allergies  Allergen Reactions  . Latex Rash    "hands turn beat red and start to itch"  . Atorvastatin Other (See Comments)  . Corticosteroids Other (See Comments)  . Dilaudid [Hydromorphone Hcl] Other (See Comments)    Itching- no rash- likely side effect but not true allergy.  Tolerates oxycodone.   . Lisinopril Other (See Comments)    Hives, presumed allergy  . Mirtazapine Swelling    swelling  . Nsaids     Gastritis 2013  . Prednisone     Mood changes.    Social History   Social History  . Marital Status: Divorced    Spouse Name: N/A  . Number of Children: 2  . Years of Education: N/A   Occupational History  . Dental Hygienist, [employer]    Social History Main Topics  . Smoking status: Never Smoker   . Smokeless tobacco: Never Used  . Alcohol Use: Yes     Comment: rare as of 2016  . Drug Use: No  . Sexual Activity: Not on file   Other Topics Concern  . 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 Appendectomy      As a child  . Tonsillectomy      As a child  . Nsvd      X 2  . Tubal ligation  Late 20's  . Foot surgery      Pin in small toe  . Doppler echocardiography      Secondary to Redux, wnl  . Dobutamine stress echo  11/00    wnl  . Cat scan  11/00    wnl  . Esophagogastroduodenoscopy  8/01    Esophagitis [facility] Plan)  . Sbft  08/01    wnl  . 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 Cholecystectomy  04/20/2012    Procedure: LAPAROSCOPIC CHOLECYSTECTOMY WITH INTRAOPERATIVE CHOLANGIOGRAM;  Surgeon: Merrie Roof, MD;  Location: [facility];  Service: General;  Laterality: N/A;    Objective:  General: Alert and oriented x3 in no acute distress  Dermatology: No open lesions bilateral lower extremities, no webspace macerations, no ecchymosis bilateral, all nails x 10 are well manicured.  Vascular: Dorsalis Pedis and Posterior Tibial pedal pulses 2/4, Capillary Fill Time 3 seconds, (+) pedal hair growth bilateral, no edema bilateral lower extremities, Temperature gradient within normal limits.  Neurology: Gross sensation intact via light touch bilateral, Protective sensation intact  with Thornell Mule Monofilament to all pedal sites, No babinski sign present bilateral. (-) Tinels sign.   Musculoskeletal: Mild tenderness with palpation left bunion deformity, no limitation or crepitus with range of motion, deformity reducible, tracking not trackbound, there is no 1st ray hypermobility noted bilateral. Pedal and ankle joint range of motion is within normal limits. 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   Xrays  Left Foot    Impression:Normal osseous mineralization, Joint space narrowing at 1st MTPJ, squaring of 1st met head with Intermetatarsal angle above normal limits, bone spur posterior and inferior calcaneus, No other acute findings, Soft tissues within normal limits.   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 All patient's questions answered. -Patient opt for surgical management. Consent obtain for Left bunionectomy with decompression osteotomy with screw ixation. Pre and  Post op course explained. Surgical booking slip submitted and provided patient with Surgical packet and info for [facility]. Patient to call to schedule date. Will require medical clearance due to patient's history of alcoholism; recovering last drink 1 month ago.  -Dispensed CAM Walker to use post op -Recommend continue with good supportive shoes and inserts, rest, ice, elevation as needed in the meantime.  -Patient to return to office after surgery or sooner if condition worsens.  Landis Martins, DPM

## 2015-04-16 NOTE — Patient Instructions (Signed)
Pre-Operative Instructions  Congratulations, you have decided to take an important step to improving your quality of life.  You can be assured that the doctors of [facility] will be with you every step of the way.  1. Plan to be at the surgery center/hospital at least 1 (one) hour prior to your scheduled time unless otherwise directed by the surgical center/hospital staff.  You must have a responsible adult accompany you, remain during the surgery and drive you home.  Make sure you have directions to the surgical center/hospital and know how to get there on time. 2. For hospital based surgery you will need to obtain a history and physical form from your family physician within 1 month prior to the date of surgery- we will give you a form for you primary physician.  3. We make every effort to accommodate the date you request for surgery.  There are however, times where surgery dates or times have to be moved.  We will contact you as soon as possible if a change in schedule is required.   4. No Aspirin/Ibuprofen for one week before surgery.  If you are on aspirin, any non-steroidal anti-inflammatory medications (Mobic, Aleve, Ibuprofen) you should stop taking it 7 days prior to your surgery.  You make take Tylenol  For pain prior to surgery.  5. Medications- If you are taking daily heart and blood pressure medications, seizure, reflux, allergy, asthma, anxiety, pain or diabetes medications, make sure the surgery center/hospital is aware before the day of surgery so they may notify you which medications to take or avoid the day of surgery. 6. No food or drink after midnight the night before surgery unless directed otherwise by surgical center/hospital staff. 7. No alcoholic beverages 24 hours prior to surgery.  No smoking 24 hours prior to or 24 hours after surgery. 8. Wear loose pants or shorts- loose enough to fit over bandages, boots, and casts. 9. No slip on shoes, sneakers are best. 10. Bring  your boot with you to the surgery center/hospital.  Also bring crutches or a walker if your physician has prescribed it for you.  If you do not have this equipment, it will be provided for you after surgery. 11. If you have not been contracted by the surgery center/hospital by the day before your surgery, call to confirm the date and time of your surgery. 12. Leave-time from work may vary depending on the type of surgery you have.  Appropriate arrangements should be made prior to surgery with your employer. 13. Prescriptions will be provided immediately following surgery by your doctor.  Have these filled as soon as possible after surgery and take the medication as directed. 14. Remove nail polish on the operative foot. 15. Wash the night before surgery.  The night before surgery wash the foot and leg well with the antibacterial soap provided and water paying special attention to beneath the toenails and in between the toes.  Rinse thoroughly with water and dry well with a towel.  Perform this wash unless told not to do so by your physician.  Enclosed: 1 Ice pack (please put in freezer the night before surgery)   1 Hibiclens skin cleaner   Pre-op Instructions  If you have any questions regarding the instructions, do not hesitate to call our office.  [place]: [address] , [place] [address] [phone]  [place]: [address] , [place] [address] [phone]  [place]: [address]  [place],  [address] [phone]  Dr. Richard   Tuchman DPM, Dr. Norman Regal DPM Dr. Richard Sikora DPM, Dr. M. Todd Hyatt DPM, Dr. Kathryn Egerton DPM 

## 2015-04-18 ENCOUNTER — Ambulatory Visit (INDEPENDENT_AMBULATORY_CARE_PROVIDER_SITE_OTHER): Payer: BLUE CROSS/BLUE SHIELD | Admitting: Family Medicine

## 2015-04-18 ENCOUNTER — Telehealth: Payer: Self-pay | Admitting: Family Medicine

## 2015-04-18 ENCOUNTER — Telehealth: Payer: Self-pay | Admitting: *Deleted

## 2015-04-18 ENCOUNTER — Encounter: Payer: Self-pay | Admitting: Family Medicine

## 2015-04-18 VITALS — BP 128/82 | HR 69 | Temp 98.6°F | Wt 179.8 lb

## 2015-04-18 DIAGNOSIS — N649 Disorder of breast, unspecified: Secondary | ICD-10-CM

## 2015-04-18 DIAGNOSIS — F1011 Alcohol abuse, in remission: Secondary | ICD-10-CM

## 2015-04-18 DIAGNOSIS — F1010 Alcohol abuse, uncomplicated: Secondary | ICD-10-CM | POA: Diagnosis not present

## 2015-04-18 MED ORDER — BUPROPION HCL ER (SR) 150 MG PO TB12: 150.0000 mg | ORAL_TABLET | Freq: Two times a day (BID) | ORAL | Status: DC

## 2015-04-18 MED ORDER — CEPHALEXIN 500 MG PO CAPS: 500.0000 mg | ORAL_CAPSULE | Freq: Four times a day (QID) | ORAL | Status: DC

## 2015-04-18 NOTE — Assessment & Plan Note (Addendum)
Likely superficial infection, would start keflex.  No abscess to I&D.  Okay for outpatient f/u.  Will ask for diag mammogram and u/s next week.  Wouldn't do at this point since it would likely be abnormal from the superficial changes.   D/w pt. She agrees.  >25 minutes spent in face to face time with patient, >50% spent in counselling or coordination of care.

## 2015-04-18 NOTE — Telephone Encounter (Signed)
i spoke with Abigail [name] @ [facility] She needs mammogram changed to uni left diagnostic before she can schedule Please change thanks

## 2015-04-18 NOTE — Progress Notes (Signed)
Pre visit review using our clinic review tool, if applicable. No additional management support is needed unless otherwise documented below in the visit note.  She is still seeing psychology and wellbutrin has been tolerated but not effective yet at current dose.  She has been sober since starting counseling.  She notes anger as a trigger for drinking.  Counseling is helping some in the meantime.  She has f/u with counseling next week.    Spot on her breast.  She noted a palpable bump on the L breast a few weeks ago.  Now with a sore and irritation.  Not improved with topical abx.  No FCNAVD.  No R breast sx.  No nipple discharge.  She usually does breast exams and had a mammogram in 2016.    PMH and SH reviewed, mother with h/o breast cancer.   ROS: See HPI, otherwise noncontributory.  Meds, vitals, and allergies reviewed.   nad Chaperoned exam.  L breast and axilla wnl w/o mass noted except for 1cm round scabbed area at 6 o'clock on the L breast.  Scab has periphery of mild erythema that extends 1.5cm in diameter, inclusive of the scab.  No other lesion.  No fluctuant mass.  Not ttp.  No draining.

## 2015-04-18 NOTE — Patient Instructions (Signed)
Abigail LimerickMarion will call about your referral. Will need mammogram next week.  Start the antibiotics in the meantime.   Increase the wellbutrin to twice a day.  Take care.  Glad to see you.

## 2015-04-18 NOTE — Assessment & Plan Note (Signed)
She'll continue with counseling, inc wellbutrin to BID in the meantime.  Okay for outpatient f/u.  Maintaining sobriety.

## 2015-04-19 NOTE — Telephone Encounter (Signed)
"  I need to schedule my surgery."  She can do it on 05/06/2015.  "That date will be fine."  Go ahead and register with the surgical center.  "I will."

## 2015-04-19 NOTE — Telephone Encounter (Signed)
Changed.  Thanks.

## 2015-04-23 NOTE — Telephone Encounter (Signed)
[facility] 1/26 pt aware

## 2015-04-24 ENCOUNTER — Encounter: Payer: Self-pay | Admitting: *Deleted

## 2015-04-24 ENCOUNTER — Telehealth: Payer: Self-pay | Admitting: Family Medicine

## 2015-04-24 NOTE — Telephone Encounter (Signed)
This patient should be acceptably low risk to go through the planned podiatry procedure.  Thanks.

## 2015-05-02 ENCOUNTER — Ambulatory Visit
Admission: RE | Admit: 2015-05-02 | Discharge: 2015-05-02 | Disposition: A | Discharge status: Home / Self Care | Payer: BLUE CROSS/BLUE SHIELD | Source: Ambulatory Visit | Attending: Family Medicine | Admitting: Family Medicine

## 2015-05-02 ENCOUNTER — Other Ambulatory Visit: Payer: Self-pay | Admitting: Family Medicine

## 2015-05-02 DIAGNOSIS — N649 Disorder of breast, unspecified: Secondary | ICD-10-CM

## 2015-05-06 ENCOUNTER — Encounter: Payer: Self-pay | Admitting: Sports Medicine

## 2015-05-06 DIAGNOSIS — M2012 Hallux valgus (acquired), left foot: Secondary | ICD-10-CM

## 2015-05-07 ENCOUNTER — Telehealth: Payer: Self-pay | Admitting: Sports Medicine

## 2015-05-07 NOTE — Telephone Encounter (Signed)
Called patient to check to see how she was doing after surgery; there was no answer. Left voicemail with callback # -Dr. Marylene Land

## 2015-05-14 ENCOUNTER — Ambulatory Visit (INDEPENDENT_AMBULATORY_CARE_PROVIDER_SITE_OTHER): Payer: BLUE CROSS/BLUE SHIELD | Admitting: Sports Medicine

## 2015-05-14 ENCOUNTER — Ambulatory Visit (INDEPENDENT_AMBULATORY_CARE_PROVIDER_SITE_OTHER): Payer: BLUE CROSS/BLUE SHIELD

## 2015-05-14 ENCOUNTER — Encounter: Payer: Self-pay | Admitting: Sports Medicine

## 2015-05-14 DIAGNOSIS — Z9889 Other specified postprocedural states: Secondary | ICD-10-CM

## 2015-05-14 DIAGNOSIS — M79672 Pain in left foot: Secondary | ICD-10-CM

## 2015-05-14 DIAGNOSIS — G8918 Other acute postprocedural pain: Secondary | ICD-10-CM

## 2015-05-14 MED ORDER — IBUPROFEN-FAMOTIDINE 800-26.6 MG PO TABS: 1.0000 | ORAL_TABLET | Freq: Four times a day (QID) | ORAL | Status: DC | PRN

## 2015-05-14 NOTE — Progress Notes (Signed)
Patient ID: Abigail [name], female   DOB: 12/29/1960, 55 y.o.   MRN: [id number] Subjective: Abigail [name] is a 55 y.o. female patient seen today in office for POV #1 (DOS 05-06-15), S/P Left [place] Bunionectomy. Patient denies current pain at surgical site, denies calf pain, denies headache, chest pain, shortness of breath, nausea, vomiting, fever, or chills. Patient states that s/he is doing well and is only taking Ibuprofen. No other issues noted.   Patient Active Problem List   Diagnosis Date Noted  . Breast lesion 04/18/2015  . Low grade fever 01/01/2015  . Abdominal pain, epigastric 01/01/2015  . Sciatica 10/10/2014  . At high risk for caregiver role strain 06/05/2014  . Routine general medical examination at a health care facility 06/05/2014  . Advance care planning 06/05/2014  . Pain in joint, shoulder region 07/19/2013  . History of alcohol abuse 09/13/2012  . Cholecystitis with cholelithiasis 07/01/2012  . Biliary colic 04/19/2012  . Anxiety 04/10/2012  . Pulmonary nodules 04/10/2012  . Gastritis 03/17/2012  . Pancreatitis, acute 03/13/2012  . Transaminitis 05/17/2011  . Other screening mammogram 05/17/2011  . B12 deficiency 01/31/2010  . ARTHRALGIA 01/31/2010  . Essential hypertension 08/16/2006  . ASTHMA, EXTRINSIC NOS 08/16/2006  . ESOPHAGITIS 08/16/2006  . GERD 08/16/2006   Current Outpatient Prescriptions on File Prior to Visit  Medication Sig Dispense Refill  . buPROPion (WELLBUTRIN SR) 150 MG 12 hr tablet Take 1 tablet (150 mg total) by mouth 2 (two) times daily. 180 tablet 1  . cephALEXin (KEFLEX) 500 MG capsule Take 1 capsule (500 mg total) by mouth 4 (four) times daily. 40 capsule 0  . metoprolol (LOPRESSOR) 50 MG tablet Take 1 tablet (50 mg total) by mouth 2 (two) times daily. 180 tablet 3  . pantoprazole (PROTONIX) 40 MG tablet Take 1 tablet (40 mg total) by mouth daily. 90 tablet 3  . promethazine (PHENERGAN) 25 MG tablet Take 25 mg by mouth every 6 (six)  hours as needed for nausea or vomiting.     No current facility-administered medications on file prior to visit.   Allergies  Allergen Reactions  . Latex Rash    "hands turn beat red and start to itch"  . Atorvastatin Other (See Comments)  . Corticosteroids Other (See Comments)  . Dilaudid [Hydromorphone Hcl] Other (See Comments)    Itching- no rash- likely side effect but not true allergy.  Tolerates oxycodone.   . Lisinopril Other (See Comments)    Hives, presumed allergy  . Mirtazapine Swelling    swelling  . Nsaids     Gastritis 2013  . Prednisone     Mood changes.     Objective: General: No acute distress, AAOx3  Left foot: Sutures intact with no gapping or dehiscence at surgical site, mild swelling to forefoot, no erythema, no warmth, no drainage, no signs of infection noted, + abrasion to left posterior heel with no signs infection secondary to rubbing in CAM boot, Capillary fill time <3 seconds in all digits, gross sensation present via light touch to left foot. No pain or crepitation with range of motion left foot.  No pain with calf compression.   Post Op Xray,Left foot: 1st metatarsal in Excellent alignment and position. Osteotomy site healing. Hardware intact. Soft tissue swelling within normal limits for post op status.   Assessment and Plan:  Problem List Items Addressed This Visit    None    Visit Diagnoses    Left foot pain    -  Primary    Relevant Orders    DG Foot Complete Left    Status post left foot surgery        [place] bunionectomy with screw fixation, 05-06-15, healing well without complication    Post-op pain        Relevant Medications    Ibuprofen-Famotidine 800-26.6 MG TABS        -Patient seen and evaluated -Applied dry sterile dressing to surgical site left foot secured with ACE wrap and stockinet  -Advised patient to make sure to keep dressings clean, dry, and intact to left surgical site, removing the ACE and applying triple antibiotc  to left posterior heel as needed  -Advised patient to continue with CAM boot on left foot   -Advised patient to limit activity to necessity  -Advised patient to ice and elevate as necessary  -Rx Duexis, enteric coated motrin for pain to take as needed in place of narcotic  -Will plan for suture removal at next office visit. In the meantime, patient to call office if any issues or problems arise.   Abigail [name], DPM

## 2015-05-17 ENCOUNTER — Ambulatory Visit: Payer: BLUE CROSS/BLUE SHIELD

## 2015-05-24 ENCOUNTER — Ambulatory Visit (INDEPENDENT_AMBULATORY_CARE_PROVIDER_SITE_OTHER): Payer: BLUE CROSS/BLUE SHIELD | Admitting: Sports Medicine

## 2015-05-24 ENCOUNTER — Encounter: Payer: Self-pay | Admitting: Sports Medicine

## 2015-05-24 DIAGNOSIS — Z9889 Other specified postprocedural states: Secondary | ICD-10-CM

## 2015-05-24 DIAGNOSIS — G8918 Other acute postprocedural pain: Secondary | ICD-10-CM

## 2015-05-24 MED ORDER — HYDROCODONE-ACETAMINOPHEN 5-325 MG PO TABS: 1.0000 | ORAL_TABLET | Freq: Four times a day (QID) | ORAL | Status: DC | PRN

## 2015-05-24 MED ORDER — IBUPROFEN-FAMOTIDINE 800-26.6 MG PO TABS: 1.0000 | ORAL_TABLET | Freq: Four times a day (QID) | ORAL | Status: DC | PRN

## 2015-05-24 NOTE — Progress Notes (Signed)
Patient ID: Abigail [name], female   DOB: 11-16-1960, 55 y.o.   MRN: [id number] Subjective: Abigail [name] is a 55 y.o. female patient seen today in office for POV #2 (DOS 05-06-15), S/P Left [place] Bunionectomy. Patient denies current pain at surgical site, denies calf pain, denies headache, chest pain, shortness of breath, nausea, vomiting, fever, or chills. Patient states that she is doing well and is only taking Ibuprofen , enteric coated and Norco half to pill occasionally. No other issues noted.   Patient Active Problem List   Diagnosis Date Noted  . Breast lesion 04/18/2015  . Low grade fever 01/01/2015  . Abdominal pain, epigastric 01/01/2015  . Sciatica 10/10/2014  . At high risk for caregiver role strain 06/05/2014  . Routine general medical examination at a health care facility 06/05/2014  . Advance care planning 06/05/2014  . Pain in joint, shoulder region 07/19/2013  . History of alcohol abuse 09/13/2012  . Cholecystitis with cholelithiasis 07/01/2012  . Biliary colic 04/19/2012  . Anxiety 04/10/2012  . Pulmonary nodules 04/10/2012  . Gastritis 03/17/2012  . Pancreatitis, acute 03/13/2012  . Transaminitis 05/17/2011  . Other screening mammogram 05/17/2011  . B12 deficiency 01/31/2010  . ARTHRALGIA 01/31/2010  . Essential hypertension 08/16/2006  . ASTHMA, EXTRINSIC NOS 08/16/2006  . ESOPHAGITIS 08/16/2006  . GERD 08/16/2006   Current Outpatient Prescriptions on File Prior to Visit  Medication Sig Dispense Refill  . buPROPion (WELLBUTRIN SR) 150 MG 12 hr tablet Take 1 tablet (150 mg total) by mouth 2 (two) times daily. 180 tablet 1  . cephALEXin (KEFLEX) 500 MG capsule Take 1 capsule (500 mg total) by mouth 4 (four) times daily. 40 capsule 0  . metoprolol (LOPRESSOR) 50 MG tablet Take 1 tablet (50 mg total) by mouth 2 (two) times daily. 180 tablet 3  . pantoprazole (PROTONIX) 40 MG tablet Take 1 tablet (40 mg total) by mouth daily. 90 tablet 3  . promethazine  (PHENERGAN) 25 MG tablet Take 25 mg by mouth every 6 (six) hours as needed for nausea or vomiting.     No current facility-administered medications on file prior to visit.   Allergies  Allergen Reactions  . Latex Rash    "hands turn beat red and start to itch"  . Atorvastatin Other (See Comments)  . Corticosteroids Other (See Comments)  . Dilaudid [Hydromorphone Hcl] Other (See Comments)    Itching- no rash- likely side effect but not true allergy.  Tolerates oxycodone.   . Lisinopril Other (See Comments)    Hives, presumed allergy  . Mirtazapine Swelling    swelling  . Nsaids     Gastritis 2013  . Prednisone     Mood changes.     Objective: General: No acute distress, AAOx3  Left foot: Sutures intact with no gapping or dehiscence at surgical site, mild swelling to forefoot, no erythema, no warmth, no drainage, no signs of infection noted, + abrasion to left posterior heel with no signs infection secondary to rubbing in CAM boot, Capillary fill time <3 seconds in all digits, gross sensation present via light touch to left foot. No pain or crepitation with range of motion left foot.  No pain with calf compression.    Assessment and Plan:  Problem List Items Addressed This Visit    None    Visit Diagnoses    Status post left foot surgery    -  Primary    [place], 05-06-15    Post-op pain  Relevant Medications    Ibuprofen-Famotidine 800-26.6 MG TABS        -Patient seen and evaluated -Removed sutures, applied steri-strips, Applied dry sterile dressing to surgical site left foot secured with ACE wrap and stockinet  -Advised patient to make sure to keep dressings clean, dry, and intact to left surgical site, removing the ACE and applying triple antibiotc to left posterior heel as needed; After 1 week Patient may shower as normal allowing Steri-Strips to fall off on their own -Advised patient to continue with CAM boot on left foot   -Advised patient to limit activity  to necessity  -Advised patient to ice and elevate as necessary  -Refilled Duexis, enteric coated motrin for pain to take as needed and refilled Norco at 5/325 for pain unrelieved by anti-inflammatory -Patient to return in 2 weeks will consider stockinette and transition to post op shoe. In the meantime, patient to call office if any issues or problems arise.   [name], DPM

## 2015-06-11 ENCOUNTER — Ambulatory Visit (INDEPENDENT_AMBULATORY_CARE_PROVIDER_SITE_OTHER): Payer: BLUE CROSS/BLUE SHIELD | Admitting: Sports Medicine

## 2015-06-11 ENCOUNTER — Ambulatory Visit (INDEPENDENT_AMBULATORY_CARE_PROVIDER_SITE_OTHER): Payer: BLUE CROSS/BLUE SHIELD

## 2015-06-11 ENCOUNTER — Encounter: Payer: Self-pay | Admitting: Sports Medicine

## 2015-06-11 DIAGNOSIS — Z9889 Other specified postprocedural states: Secondary | ICD-10-CM

## 2015-06-11 DIAGNOSIS — M79672 Pain in left foot: Secondary | ICD-10-CM

## 2015-06-11 MED ORDER — IBUPROFEN 600 MG PO TABS: 600.0000 mg | ORAL_TABLET | Freq: Three times a day (TID) | ORAL | Status: DC | PRN

## 2015-06-11 NOTE — Progress Notes (Signed)
Patient ID: Abigail [name], female   DOB: [birth year], 55 y.o.   MRN: [id number]  Subjective: Abigail [name] is a 55 y.o. female patient seen today in office for POV #3 (DOS 05-06-15), S/P Left [place] Bunionectomy. Patient denies current pain at surgical site, denies calf pain, denies headache, chest pain, shortness of breath, nausea, vomiting, fever, or chills. Patient states that she is doing well and is only taking Ibuprofen , enteric coated and Norco occasionally; desires refill on Ibuprofen. No other issues noted.   Patient Active Problem List   Diagnosis Date Noted  . Breast lesion 04/18/2015  . Low grade fever 01/01/2015  . Abdominal pain, epigastric 01/01/2015  . Sciatica 10/10/2014  . At high risk for caregiver role strain 06/05/2014  . Routine general medical examination at a health care facility 06/05/2014  . Advance care planning 06/05/2014  . Pain in joint, shoulder region 07/19/2013  . History of alcohol abuse 09/13/2012  . Cholecystitis with cholelithiasis 07/01/2012  . Biliary colic 04/19/2012  . Anxiety 04/10/2012  . Pulmonary nodules 04/10/2012  . Gastritis 03/17/2012  . Pancreatitis, acute 03/13/2012  . Transaminitis 05/17/2011  . Other screening mammogram 05/17/2011  . B12 deficiency 01/31/2010  . ARTHRALGIA 01/31/2010  . Essential hypertension 08/16/2006  . ASTHMA, EXTRINSIC NOS 08/16/2006  . ESOPHAGITIS 08/16/2006  . GERD 08/16/2006   Current Outpatient Prescriptions on File Prior to Visit  Medication Sig Dispense Refill  . buPROPion (WELLBUTRIN SR) 150 MG 12 hr tablet Take 1 tablet (150 mg total) by mouth 2 (two) times daily. 180 tablet 1  . cephALEXin (KEFLEX) 500 MG capsule Take 1 capsule (500 mg total) by mouth 4 (four) times daily. 40 capsule 0  . HYDROcodone-acetaminophen (NORCO) 5-325 MG tablet Take 1 tablet by mouth every 6 (six) hours as needed for moderate pain. 30 tablet 0  . Ibuprofen-Famotidine 800-26.6 MG TABS Take 1 tablet by mouth every 6 (six)  hours as needed. 60 tablet 0  . metoprolol (LOPRESSOR) 50 MG tablet Take 1 tablet (50 mg total) by mouth 2 (two) times daily. 180 tablet 3  . pantoprazole (PROTONIX) 40 MG tablet Take 1 tablet (40 mg total) by mouth daily. 90 tablet 3  . promethazine (PHENERGAN) 25 MG tablet Take 25 mg by mouth every 6 (six) hours as needed for nausea or vomiting.     No current facility-administered medications on file prior to visit.   Allergies  Allergen Reactions  . Latex Rash    "hands turn beat red and start to itch"  . Atorvastatin Other (See Comments)  . Corticosteroids Other (See Comments)  . Dilaudid [Hydromorphone Hcl] Other (See Comments)    Itching- no rash- likely side effect but not true allergy.  Tolerates oxycodone.   . Lisinopril Other (See Comments)    Hives, presumed allergy  . Mirtazapine Swelling    swelling  . Nsaids     Gastritis 2013  . Prednisone     Mood changes.     Objective: General: No acute distress, AAOx3  Left foot: Incision healed with no gapping or dehiscence at surgical site,  mild swelling to forefoot, no erythema, no warmth, no drainage, no signs of infection noted, resolved abrasion at left posterior heel with no signs infection, Capillary fill time <3 seconds in all digits, gross sensation present via light touch to left foot. No pain or crepitation with range of motion left foot.  No pain with calf compression.   Xrays, Left foot: Hardware intact to first  metatarsal. Osteotomy site healing well. No acute dislocation. Soft tissues within normal limits for post op status. No other acute findings.    Assessment and Plan:  Problem List Items Addressed This Visit    None    Visit Diagnoses    Left foot pain    -  Primary    Relevant Orders    DG Foot Complete Left    Status post left foot surgery           -Patient seen and evaluated -Dispensed surgical anklet to use for edema control -Transitioned patient from CAM boot to post op shoe to use at all  times when attempting ambulation  -Encouraged ROM exercises daily as instructed at left 1st MTPJ -Advised patient to limit activity to necessity  -Advised patient to ice and elevate as necessary  -Refilled motrin for pain to take as needed and refilled Norco at 5/325 for pain unrelieved by anti-inflammatory -Patient to return in 2 weeks will consider transitioning to normal shoe. In the meantime, patient to call office if any issues or problems arise.   Asencion Islamitorya Mykael Batz, DPM

## 2015-06-14 NOTE — Progress Notes (Signed)
Patient ID: Abigail [name], female   DOB: Dec 11, 1960, 55 y.o.   MRN: [id number] Dr Marylene LandStover performed a Lt foot Bunionectomy with screw fixation for bunion correction and to decompress 1st MPJ left foot [facility]06[facility]

## 2015-06-21 ENCOUNTER — Other Ambulatory Visit: Payer: Self-pay | Admitting: Sports Medicine

## 2015-06-24 ENCOUNTER — Other Ambulatory Visit: Payer: Self-pay | Admitting: *Deleted

## 2015-06-24 MED ORDER — PROMETHAZINE HCL 25 MG PO TABS: 25.0000 mg | ORAL_TABLET | Freq: Four times a day (QID) | ORAL | Status: DC | PRN

## 2015-06-24 NOTE — Telephone Encounter (Signed)
Sent.  Have her f/u if persistent sx.  Thanks.

## 2015-06-25 ENCOUNTER — Encounter: Payer: Self-pay | Admitting: Sports Medicine

## 2015-06-25 ENCOUNTER — Ambulatory Visit (INDEPENDENT_AMBULATORY_CARE_PROVIDER_SITE_OTHER): Payer: BLUE CROSS/BLUE SHIELD | Admitting: Sports Medicine

## 2015-06-25 DIAGNOSIS — M79672 Pain in left foot: Secondary | ICD-10-CM

## 2015-06-25 DIAGNOSIS — Z9889 Other specified postprocedural states: Secondary | ICD-10-CM

## 2015-06-25 MED ORDER — IBUPROFEN 600 MG PO TABS: 600.0000 mg | ORAL_TABLET | Freq: Three times a day (TID) | ORAL | Status: DC | PRN

## 2015-06-25 NOTE — Progress Notes (Signed)
Patient ID: AERIELLE STOKLOSA, female   DOB: 07-15-1960, 55 y.o.   MRN: [id number] Subjective: MICHELENE KENISTON is a 55 y.o. female patient seen today in office for POV #4 (DOS 05-06-15), S/P Left [place] Bunionectomy. Patient denies current pain at surgical site, denies calf pain, denies headache, chest pain, shortness of breath, nausea, vomiting, fever, or chills. Patient states that she is doing well and is only taking Ibuprofen as needed. No other issues noted.   Patient Active Problem List   Diagnosis Date Noted  . Breast lesion 04/18/2015  . Low grade fever 01/01/2015  . Abdominal pain, epigastric 01/01/2015  . Sciatica 10/10/2014  . At high risk for caregiver role strain 06/05/2014  . Routine general medical examination at a health care facility 06/05/2014  . Advance care planning 06/05/2014  . Pain in joint, shoulder region 07/19/2013  . History of alcohol abuse 09/13/2012  . Cholecystitis with cholelithiasis 07/01/2012  . Biliary colic 04/19/2012  . Anxiety 04/10/2012  . Pulmonary nodules 04/10/2012  . Gastritis 03/17/2012  . Pancreatitis, acute 03/13/2012  . Transaminitis 05/17/2011  . Other screening mammogram 05/17/2011  . B12 deficiency 01/31/2010  . ARTHRALGIA 01/31/2010  . Essential hypertension 08/16/2006  . ASTHMA, EXTRINSIC NOS 08/16/2006  . ESOPHAGITIS 08/16/2006  . GERD 08/16/2006   Current Outpatient Prescriptions on File Prior to Visit  Medication Sig Dispense Refill  . buPROPion (WELLBUTRIN SR) 150 MG 12 hr tablet Take 1 tablet (150 mg total) by mouth 2 (two) times daily. 180 tablet 1  . cephALEXin (KEFLEX) 500 MG capsule Take 1 capsule (500 mg total) by mouth 4 (four) times daily. 40 capsule 0  . HYDROcodone-acetaminophen (NORCO) 5-325 MG tablet Take 1 tablet by mouth every 6 (six) hours as needed for moderate pain. 30 tablet 0  . Ibuprofen-Famotidine 800-26.6 MG TABS Take 1 tablet by mouth every 6 (six) hours as needed. 60 tablet 0  . metoprolol (LOPRESSOR) 50  MG tablet Take 1 tablet (50 mg total) by mouth 2 (two) times daily. 180 tablet 3  . pantoprazole (PROTONIX) 40 MG tablet Take 1 tablet (40 mg total) by mouth daily. 90 tablet 3  . promethazine (PHENERGAN) 25 MG tablet Take 1 tablet (25 mg total) by mouth every 6 (six) hours as needed for nausea or vomiting. 30 tablet 0   No current facility-administered medications on file prior to visit.   Allergies  Allergen Reactions  . Latex Rash    "hands turn beat red and start to itch"  . Atorvastatin Other (See Comments)  . Corticosteroids Other (See Comments)  . Dilaudid [Hydromorphone Hcl] Other (See Comments)    Itching- no rash- likely side effect but not true allergy.  Tolerates oxycodone.   . Lisinopril Other (See Comments)    Hives, presumed allergy  . Mirtazapine Swelling    swelling  . Nsaids     Gastritis 2013  . Prednisone     Mood changes.     Objective: General: No acute distress, AAOx3  Left foot: Incision healed with no gapping or dehiscence at surgical site,  mild swelling to forefoot, no erythema, no warmth, no drainage, no signs of infection noted, Capillary fill time <3 seconds in all digits, gross sensation present via light touch to left foot. No pain or crepitation with range of motion left foot.  No pain with calf compression.   Assessment and Plan:  Problem List Items Addressed This Visit    None    Visit Diagnoses  Left foot pain    -  Primary    Relevant Medications    ibuprofen (ADVIL,MOTRIN) 600 MG tablet    Status post left foot surgery        Relevant Medications    ibuprofen (ADVIL,MOTRIN) 600 MG tablet       -Patient seen and evaluated -Cont with surgical anklet as needed for edema control -Patient to transition from Post op shoe to normal shoe on left as instructed -Encouraged ROM exercises daily as instructed at left 1st MTPJ -Advised patient to limit activity to tolerance -Advised patient to ice and elevate as necessary  -Refilled motrin for  pain to take as needed -Patient to return in 4 weeks will xray at this visit. In the meantime, patient to call office if any issues or problems arise.   Asencion Islamitorya Samanthajo Payano, DPM

## 2015-06-25 NOTE — Telephone Encounter (Signed)
Left detailed message on voicemail.  

## 2015-07-09 ENCOUNTER — Other Ambulatory Visit: Payer: Self-pay | Admitting: *Deleted

## 2015-07-09 MED ORDER — METOPROLOL TARTRATE 50 MG PO TABS: 50.0000 mg | ORAL_TABLET | Freq: Two times a day (BID) | ORAL | Status: DC

## 2015-07-26 ENCOUNTER — Ambulatory Visit (INDEPENDENT_AMBULATORY_CARE_PROVIDER_SITE_OTHER): Payer: BLUE CROSS/BLUE SHIELD | Admitting: Sports Medicine

## 2015-07-26 ENCOUNTER — Encounter: Payer: Self-pay | Admitting: Sports Medicine

## 2015-07-26 ENCOUNTER — Ambulatory Visit (INDEPENDENT_AMBULATORY_CARE_PROVIDER_SITE_OTHER): Payer: BLUE CROSS/BLUE SHIELD

## 2015-07-26 DIAGNOSIS — M79672 Pain in left foot: Secondary | ICD-10-CM

## 2015-07-26 DIAGNOSIS — Z9889 Other specified postprocedural states: Secondary | ICD-10-CM

## 2015-07-27 NOTE — Progress Notes (Signed)
Patient ID: Abigail [name], female   DOB: [birth year], 55 y.o.   MRN: [id number]  Subjective: Abigail [name] is a 55 y.o. female patient seen today in office for POV #5 (DOS 05-06-15), S/P Left [place] Bunionectomy. Patient denies current pain at surgical site, denies calf pain, denies headache, chest pain, shortness of breath, nausea, vomiting, fever, or chills. Patient states that she is doing well. No other issues noted.   Patient Active Problem List   Diagnosis Date Noted  . Breast lesion 04/18/2015  . Low grade fever 01/01/2015  . Abdominal pain, epigastric 01/01/2015  . Sciatica 10/10/2014  . At high risk for caregiver role strain 06/05/2014  . Routine general medical examination at a health care facility 06/05/2014  . Advance care planning 06/05/2014  . Pain in joint, shoulder region 07/19/2013  . History of alcohol abuse 09/13/2012  . Cholecystitis with cholelithiasis 07/01/2012  . Biliary colic 04/19/2012  . Anxiety 04/10/2012  . Pulmonary nodules 04/10/2012  . Gastritis 03/17/2012  . Pancreatitis, acute 03/13/2012  . Transaminitis 05/17/2011  . Other screening mammogram 05/17/2011  . B12 deficiency 01/31/2010  . ARTHRALGIA 01/31/2010  . Essential hypertension 08/16/2006  . ASTHMA, EXTRINSIC NOS 08/16/2006  . ESOPHAGITIS 08/16/2006  . GERD 08/16/2006   Current Outpatient Prescriptions on File Prior to Visit  Medication Sig Dispense Refill  . buPROPion (WELLBUTRIN SR) 150 MG 12 hr tablet Take 1 tablet (150 mg total) by mouth 2 (two) times daily. 180 tablet 1  . cephALEXin (KEFLEX) 500 MG capsule Take 1 capsule (500 mg total) by mouth 4 (four) times daily. 40 capsule 0  . HYDROcodone-acetaminophen (NORCO) 5-325 MG tablet Take 1 tablet by mouth every 6 (six) hours as needed for moderate pain. 30 tablet 0  . ibuprofen (ADVIL,MOTRIN) 600 MG tablet Take 1 tablet (600 mg total) by mouth every 8 (eight) hours as needed. 30 tablet 0  . Ibuprofen-Famotidine 800-26.6 MG TABS Take 1  tablet by mouth every 6 (six) hours as needed. 60 tablet 0  . metoprolol (LOPRESSOR) 50 MG tablet Take 1 tablet (50 mg total) by mouth 2 (two) times daily. 180 tablet 1  . pantoprazole (PROTONIX) 40 MG tablet Take 1 tablet (40 mg total) by mouth daily. 90 tablet 3  . promethazine (PHENERGAN) 25 MG tablet Take 1 tablet (25 mg total) by mouth every 6 (six) hours as needed for nausea or vomiting. 30 tablet 0   No current facility-administered medications on file prior to visit.   Allergies  Allergen Reactions  . Latex Rash    "hands turn beat red and start to itch"  . Atorvastatin Other (See Comments)  . Corticosteroids Other (See Comments)  . Dilaudid [Hydromorphone Hcl] Other (See Comments)    Itching- no rash- likely side effect but not true allergy.  Tolerates oxycodone.   . Lisinopril Other (See Comments)    Hives, presumed allergy  . Mirtazapine Swelling    swelling  . Nsaids     Gastritis 2013  . Prednisone     Mood changes.     Objective: General: No acute distress, AAOx3  Left foot: Surgical site healed,  Minimal swelling to forefoot, no erythema, no warmth, no drainage, no signs of infection noted, Capillary fill time <3 seconds in all digits, gross sensation present via light touch to left foot. No pain or crepitation with range of motion left foot. No pain with calf compression.   Xray, left foot: hardware intact, osteotomy site healing well, no acute issues.  Assessment and Plan:  Problem List Items Addressed This Visit    None    Visit Diagnoses    Left foot pain    -  Primary    Relevant Orders    DG Foot Complete Left    Status post left foot surgery        [place] bunionectomy       -Patient seen and evaluated -Cont with surgical anklet as needed for edema control -Cont with good supportive shoes daily  -Cont with ROM exercises daily as instructed at left 1st MTPJ -Advised patient may return to normal activities as tolerated -Patient to return in 12  weeks will xray at this visit. In the meantime, patient to call office if any issues or problems arise.   Abigail [name], DPM

## 2015-08-14 ENCOUNTER — Ambulatory Visit (INDEPENDENT_AMBULATORY_CARE_PROVIDER_SITE_OTHER): Payer: BLUE CROSS/BLUE SHIELD | Admitting: Family Medicine

## 2015-08-14 ENCOUNTER — Encounter: Payer: Self-pay | Admitting: Family Medicine

## 2015-08-14 VITALS — BP 130/80 | HR 55 | Temp 97.8°F | Ht 61.0 in | Wt 192.0 lb

## 2015-08-14 DIAGNOSIS — J029 Acute pharyngitis, unspecified: Secondary | ICD-10-CM | POA: Diagnosis not present

## 2015-08-14 LAB — POCT RAPID STREP A (OFFICE): RAPID STREP A SCREEN: NEGATIVE

## 2015-08-14 MED ORDER — MAGIC MOUTHWASH W/LIDOCAINE: ORAL | Status: DC

## 2015-08-14 NOTE — Progress Notes (Signed)
Dr. Karleen Hampshire T. Simon Llamas, MD, CAQ Sports [facility] [address] [place], [address] Phone: [phone] Fax: [phone]  08/14/2015  Patient: Abigail [name], MRN: [id number], DOB: [birth year], 55 y.o.  Primary Physician:  Crawford Givens, MD   Chief Complaint  Patient presents with  . Sore Throat  . Fever   Subjective:   Abigail [name] is a 55 y.o. very pleasant female patient who presents with the following:  Bad sore throat, low grade fever - thinks maybe ulcerated.  Drank some really hot coffee, then after this.  Has had a cough, also. Deep raspy cough at night.    Past Medical History, Surgical History, Social History, Family History, Problem List, Medications, and Allergies have been reviewed and updated if relevant.  Patient Active Problem List   Diagnosis Date Noted  . Breast lesion 04/18/2015  . Low grade fever 01/01/2015  . Abdominal pain, epigastric 01/01/2015  . Sciatica 10/10/2014  . At high risk for caregiver role strain 06/05/2014  . Routine general medical examination at a health care facility 06/05/2014  . Advance care planning 06/05/2014  . Pain in joint, shoulder region 07/19/2013  . History of alcohol abuse 09/13/2012  . Cholecystitis with cholelithiasis 07/01/2012  . Biliary colic 04/19/2012  . Anxiety 04/10/2012  . Pulmonary nodules 04/10/2012  . Gastritis 03/17/2012  . Pancreatitis, acute 03/13/2012  . Transaminitis 05/17/2011  . Other screening mammogram 05/17/2011  . B12 deficiency 01/31/2010  . ARTHRALGIA 01/31/2010  . Essential hypertension 08/16/2006  . ASTHMA, EXTRINSIC NOS 08/16/2006  . ESOPHAGITIS 08/16/2006  . GERD 08/16/2006    Past Medical History  Diagnosis Date  . GERD (gastroesophageal reflux disease)   . Hypertension   . Photosensitivity     (No formal dx of lupus) prev eval Dr. Gavin Potters in [place]  . Pancreatitis     2013 alcohol and/or gallstone  . Cholecystitis   . Alcoholism  [facility])     Past Surgical History  Procedure Laterality Date  . Appendectomy      As a child  . Tonsillectomy      As a child  . Nsvd      X 2  . Tubal ligation  Late 20's  . Foot surgery      Pin in small toe  . Doppler echocardiography      Secondary to Redux, wnl  . Dobutamine stress echo  11/00    wnl  . Cat scan  11/00    wnl  . Esophagogastroduodenoscopy  8/01    Esophagitis [name])  . Sbft  08/01    wnl  . 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 Cholecystectomy  04/20/2012    Procedure: LAPAROSCOPIC CHOLECYSTECTOMY WITH INTRAOPERATIVE CHOLANGIOGRAM;  Surgeon: Robyne Askew, MD;  Location: [facility] OR;  Service: General;  Laterality: N/A;    Social History   Social History  . Marital Status: Divorced    Spouse Name: N/A  . Number of Children: 2  . Years of Education: N/A   Occupational History  . Dental Hygienist, [employer]    Social History Main Topics  . Smoking status: Never Smoker   . Smokeless tobacco: Never Used  . Alcohol Use: Yes     Comment: rare as of 2016  . Drug Use: No  . Sexual Activity: Not on file   Other Topics Concern  . Not on file   Social History Narrative   Divorced, 2 grown children  Dental Hygienist, [employer]    Family History  Problem Relation Age of Onset  . Heart disease Mother     CAD, angioplasty twice with stents  . Cancer Mother     Breast  . Breast cancer Mother 67  . Hypertension Father   . Hypothyroidism Father   . Heart disease Father     MI at age 39  . Cancer Father     H/O renal CA  . Diabetes Sister   . Cancer Sister     Renal CA  . Colon cancer Neg Hx     Allergies  Allergen Reactions  . Latex Rash    "hands turn beat red and start to itch"  . Atorvastatin Other (See Comments)  . Corticosteroids Other (See Comments)  . Dilaudid [Hydromorphone Hcl] Other (See Comments)    Itching- no rash- likely side effect but not true allergy.  Tolerates oxycodone.   . Lisinopril Other (See Comments)     Hives, presumed allergy  . Mirtazapine Swelling    swelling  . Nsaids     Gastritis 2013  . Prednisone     Mood changes.     Medication list reviewed and updated in full in [facility].  ROS: GEN: Acute illness details above GI: Tolerating PO intake GU: maintaining adequate hydration and urination Pulm: No SOB Interactive and getting along well at home.  Otherwise, ROS is as per the HPI.  Objective:   BP 130/80 mmHg  Pulse 55  Temp(Src) 97.8 F (36.6 C) (Oral)  Ht  (1.549 m)  Wt 192 lb (87.091 kg)  BMI 36.30 kg/m2  LMP  (LMP Unknown)   GEN: WDWN, NAD, Non-toxic, A & O x 3 HEENT: Atraumatic, Normocephalic. Neck supple. No masses, No LAD. Throat clear. Ears and Nose: No external deformity. TM with fluid B. CV: RRR, No M/G/R. No JVD. No thrill. No extra heart sounds. PULM: CTA B, no wheezes, crackles, rhonchi. No retractions. No resp. distress. No accessory muscle use. EXTR: No c/c/e NEURO Normal gait.  PSYCH: Normally interactive. Conversant. Not depressed or anxious appearing.  Calm demeanor.     Laboratory and Imaging Data: Results for orders placed or performed in visit on 08/14/15  POCT rapid strep A  Result Value Ref Range   Rapid Strep A Screen Negative Negative     Assessment and Plan:   Sore throat - Plan: POCT rapid strep A  Trauma via coffee vs viral  Follow-up: No Follow-up on file.  New Prescriptions   MAGIC MOUTHWASH W/LIDOCAINE SOLN    5 mL po gargle q 4 hours prn sore throat Mix: 80 ml maalox, 80 ml benadryl susp, 80 mL lidocaine 1%   Modified Medications   No medications on file   Orders Placed This Encounter  Procedures  . POCT rapid strep A    Signed,  Aribelle Mccosh T. Kaesha Kirsch, MD   Patient's Medications  New Prescriptions   MAGIC MOUTHWASH W/LIDOCAINE SOLN    5 mL po gargle q 4 hours prn sore throat Mix: 80 ml maalox, 80 ml benadryl susp, 80 mL lidocaine 1%  Previous Medications   BUPROPION (WELLBUTRIN SR) 150 MG 12 HR  TABLET    Take 1 tablet (150 mg total) by mouth 2 (two) times daily.   METOPROLOL (LOPRESSOR) 50 MG TABLET    Take 1 tablet (50 mg total) by mouth 2 (two) times daily.   PANTOPRAZOLE (PROTONIX) 40 MG TABLET    Take 1 tablet (40 mg  total) by mouth daily.   PROMETHAZINE (PHENERGAN) 25 MG TABLET    Take 1 tablet (25 mg total) by mouth every 6 (six) hours as needed for nausea or vomiting.  Modified Medications   No medications on file  Discontinued Medications   CEPHALEXIN (KEFLEX) 500 MG CAPSULE    Take 1 capsule (500 mg total) by mouth 4 (four) times daily.   HYDROCODONE-ACETAMINOPHEN (NORCO) 5-325 MG TABLET    Take 1 tablet by mouth every 6 (six) hours as needed for moderate pain.   IBUPROFEN (ADVIL,MOTRIN) 600 MG TABLET    Take 1 tablet (600 mg total) by mouth every 8 (eight) hours as needed.   IBUPROFEN-FAMOTIDINE 800-26.6 MG TABS    Take 1 tablet by mouth every 6 (six) hours as needed.

## 2015-08-14 NOTE — Progress Notes (Signed)
Pre visit review using our clinic review tool, if applicable. No additional management support is needed unless otherwise documented below in the visit note. 

## 2015-08-27 ENCOUNTER — Ambulatory Visit (INDEPENDENT_AMBULATORY_CARE_PROVIDER_SITE_OTHER): Payer: BLUE CROSS/BLUE SHIELD | Admitting: Family Medicine

## 2015-08-27 ENCOUNTER — Encounter: Payer: Self-pay | Admitting: Family Medicine

## 2015-08-27 VITALS — BP 124/84 | HR 61 | Temp 98.6°F | Wt 193.5 lb

## 2015-08-27 DIAGNOSIS — E538 Deficiency of other specified B group vitamins: Secondary | ICD-10-CM | POA: Diagnosis not present

## 2015-08-27 DIAGNOSIS — F419 Anxiety disorder, unspecified: Secondary | ICD-10-CM

## 2015-08-27 LAB — VITAMIN B12: VITAMIN B 12: 289 pg/mL (ref 211–911)

## 2015-08-27 MED ORDER — BUPROPION HCL ER (SR) 150 MG PO TB12: 150.0000 mg | ORAL_TABLET | Freq: Every day | ORAL | Status: DC

## 2015-08-27 MED ORDER — CYANOCOBALAMIN 1000 MCG/ML IJ SOLN
1000.0000 ug | Freq: Once | INTRAMUSCULAR | Status: AC
  Administered 2015-08-27: 1000 ug via INTRAMUSCULAR

## 2015-08-27 MED ORDER — METOPROLOL TARTRATE 50 MG PO TABS: 50.0000 mg | ORAL_TABLET | Freq: Two times a day (BID) | ORAL | Status: DC

## 2015-08-27 MED ORDER — PANTOPRAZOLE SODIUM 40 MG PO TBEC: 40.0000 mg | DELAYED_RELEASE_TABLET | Freq: Every day | ORAL | Status: DC

## 2015-08-27 MED ORDER — PROMETHAZINE HCL 25 MG PO TABS: 25.0000 mg | ORAL_TABLET | Freq: Four times a day (QID) | ORAL | Status: DC | PRN

## 2015-08-27 NOTE — Patient Instructions (Signed)
Call about a counseling appointment.  Cut back on the wellbutrin. Take 1 pill a day.  Update me if your mood or sleep aren't good.  Take care.  Glad to see you.

## 2015-08-27 NOTE — Progress Notes (Signed)
Pre visit review using our clinic review tool, if applicable. No additional management support is needed unless otherwise documented below in the visit note.  Her throat is better in the meantime.    She isn't sleeping well with higher dose of wellbutrin.  She has to be exhausted to get to sleep.  Her mood is "pretty good."  She has been in counseling and that has helped.  She doesn't get as upset or angry now.  She still thinks the wellbutrin has helped overall.  Occ glass of wine but not like prev.  No SI/HI.    Meds, vitals, and allergies reviewed.   ROS: Per HPI unless specifically indicated in ROS section   GEN: nad, alert and oriented HEENT: mucous membranes moist NECK: supple w/o LA CV: rrr.  no murmur PULM: ctab, no inc wob

## 2015-08-28 NOTE — Assessment & Plan Note (Signed)
See notes on labs. 

## 2015-08-28 NOTE — Assessment & Plan Note (Signed)
Mood improved, sleep is disrupted.  Not drinking much etoh.  She'll cut back to 150mg  wellbutrin in Am and update me as needed.  She may need 150mg  in AM and 75mg  in PM.   Still okay for outpatient f/u.

## 2015-09-16 ENCOUNTER — Ambulatory Visit (INDEPENDENT_AMBULATORY_CARE_PROVIDER_SITE_OTHER): Payer: BLUE CROSS/BLUE SHIELD | Admitting: Family Medicine

## 2015-09-16 ENCOUNTER — Encounter: Payer: Self-pay | Admitting: Family Medicine

## 2015-09-16 VITALS — BP 112/82 | HR 79 | Temp 98.2°F | Wt 192.5 lb

## 2015-09-16 DIAGNOSIS — G43809 Other migraine, not intractable, without status migrainosus: Secondary | ICD-10-CM | POA: Diagnosis not present

## 2015-09-16 MED ORDER — DEXAMETHASONE SODIUM PHOSPHATE 100 MG/10ML IJ SOLN
10.0000 mg | Freq: Once | INTRAMUSCULAR | Status: AC
  Administered 2015-09-16: 10 mg via INTRAMUSCULAR

## 2015-09-16 MED ORDER — SUMATRIPTAN SUCCINATE 50 MG PO TABS: 50.0000 mg | ORAL_TABLET | ORAL | Status: DC | PRN

## 2015-09-16 NOTE — Progress Notes (Signed)
Pre visit review using our clinic review tool, if applicable. No additional management support is needed unless otherwise documented below in the visit note. 

## 2015-09-16 NOTE — Patient Instructions (Signed)
Take the medication as prescribed.  Follow-up with your PCP if you fail to improve or worsen.  Take care  Dr. Adriana Simasook

## 2015-09-16 NOTE — Progress Notes (Signed)
Subjective:  Patient ID: Abigail [name], female    DOB: Apr 06, 1961  Age: 55 y.o. MRN: [id number]  CC: Headache  HPI:  55 year old female presents with complaints of headache.  Patient reports severe headache since Friday. Headache is left sided (parietal & temporal regions). Pain described as achy.  Also, has "electric shocks" with movement and swallow. She reports associated photophobia and nausea.  Has persisted since start. She has taken Aleve with no improvement. She states that she has a history of headache but this is a different kind of headache. No reports of vision loss or focal deficits. No other complaints today.  Social Hx   Social History   Social History  . Marital Status: Divorced    Spouse Name: N/A  . Number of Children: 2  . Years of Education: N/A   Occupational History  . Dental Hygienist, [employer]    Social History Main Topics  . Smoking status: Never Smoker   . Smokeless tobacco: Never Used  . Alcohol Use: Yes     Comment: rare as of 2016  . Drug Use: No  . Sexual Activity: Not Asked   Other Topics Concern  . None   Social History Narrative   Divorced, 2 grown Teaching laboratory technician, [employer]   Review of Systems  Gastrointestinal: Positive for nausea.  Neurological: Positive for headaches.       Photophobia.   Objective:  BP 112/82 mmHg  Pulse 79  Temp(Src) 98.2 F (36.8 C) (Oral)  Wt 192 lb 8 oz (87.317 kg)  SpO2 93%  LMP  (LMP Unknown)  BP/Weight 09/16/2015 08/27/2015 08/14/2015  Systolic BP 112 124 130  Diastolic BP 82 84 80  Wt. (Lbs) 192.5 193.5 192  BMI 36.39 36.58 36.3    Physical Exam  Constitutional: She is oriented to person, place, and time. She appears well-developed. No distress.  HENT:  Head: Normocephalic and atraumatic.  Eyes: Conjunctivae are normal. Pupils are equal, round, and reactive to light. No scleral icterus.  Cardiovascular: Normal rate and regular rhythm.   Pulmonary/Chest: Effort normal. She has no wheezes.  She has no rales.  Neurological: She is alert and oriented to person, place, and time.  CN 2-12 intact. 5/5 Muscle strength in all extremities.  No focal deficits.  Psychiatric: She has a normal mood and affect.  Vitals reviewed.   Lab Results  Component Value Date   WBC 5.6 03/26/2015   HGB 13.0 03/26/2015   HCT 38.6 03/26/2015   PLT 268.0 03/26/2015   GLUCOSE 87 03/26/2015   CHOL 208* 05/28/2014   TRIG 177.0* 05/28/2014   HDL 105.30 05/28/2014   LDLCALC 67 05/28/2014   ALT 18 03/26/2015   AST 25 03/26/2015   NA 140 03/26/2015   K 4.0 03/26/2015   CL 106 03/26/2015   CREATININE 0.62 03/26/2015   BUN 10 03/26/2015   CO2 27 03/26/2015   TSH 0.863 07/02/2006    Assessment & Plan:   Problem List Items Addressed This Visit    Other migraine without status migrainosus, not intractable - Primary    New problem. New type of head and patient over the age of 60. Given this, discussed advanced imaging today. History appears consistent with migraine headache. Treating with IM Decadron today.  Trial of Imitrex prior to imaging.      Relevant Medications   SUMAtriptan (IMITREX) 50 MG tablet   dexamethasone (DECADRON) injection 10 mg (Completed)      Meds ordered  this encounter  Medications  . SUMAtriptan (IMITREX) 50 MG tablet    Sig: Take 1 tablet (50 mg total) by mouth every 2 (two) hours as needed for migraine. May repeat in 2 hours if headache persists or recurs.    Dispense:  10 tablet    Refill:  0  . dexamethasone (DECADRON) injection 10 mg    Sig:     Follow-up: PRN  Everlene OtherJayce Atoya Andrew DO [facility] [place]

## 2015-09-17 DIAGNOSIS — G43809 Other migraine, not intractable, without status migrainosus: Secondary | ICD-10-CM | POA: Insufficient documentation

## 2015-09-17 NOTE — Assessment & Plan Note (Addendum)
New problem. New type of head and patient over the age of 55. Given this, discussed advanced imaging today. History appears consistent with migraine headache. Treating with IM Decadron today.  Trial of Imitrex prior to imaging.

## 2015-09-18 ENCOUNTER — Telehealth: Payer: Self-pay | Admitting: Family Medicine

## 2015-09-18 ENCOUNTER — Encounter: Payer: Self-pay | Admitting: Family

## 2015-09-18 ENCOUNTER — Ambulatory Visit (INDEPENDENT_AMBULATORY_CARE_PROVIDER_SITE_OTHER): Payer: BLUE CROSS/BLUE SHIELD | Admitting: Family

## 2015-09-18 VITALS — BP 128/88 | HR 56 | Temp 98.4°F | Resp 16 | Ht 61.0 in | Wt 196.0 lb

## 2015-09-18 DIAGNOSIS — G43809 Other migraine, not intractable, without status migrainosus: Secondary | ICD-10-CM | POA: Diagnosis not present

## 2015-09-18 MED ORDER — SUMATRIPTAN SUCCINATE 100 MG PO TABS: ORAL_TABLET | ORAL | Status: DC

## 2015-09-18 MED ORDER — SUMATRIPTAN SUCCINATE 100 MG PO TABS
ORAL_TABLET | ORAL | Status: DC
Start: 2015-09-18 — End: 2015-09-26

## 2015-09-18 NOTE — Assessment & Plan Note (Signed)
Symptoms slightly improved since previous treatment with new onset decreased sensation of left trigeminal nerve. There is concern symptoms greater than migraine headache with possibilities including a CVA or other intracranial pathology. She is out of the window for aggressive tPA therapy as it has been 9+ hours since initial symptom onset. Obtain CT scan to rule out intracranial lesions or pathology. Neurological exam is otherwise benign. In office injection of depo-medrol and Toradol provided. Strict follow up precautions given to seek additional care for any worsening of symptoms. Follow up with PCP pending results of CT scan.

## 2015-09-18 NOTE — Patient Instructions (Signed)
Thank you for choosing [facility].  Summary/Instructions:  They will call to schedule your CT scan.   Continue to take your medications as prescribed.   Your prescription(s) have been submitted to your pharmacy or been printed and provided for you. Please take as directed and contact our office if you believe you are having problem(s) with the medication(s) or have any questions.  If your symptoms worsen or fail to improve, please contact our office for further instruction, or in case of emergency go directly to the emergency room at the closest medical facility.

## 2015-09-18 NOTE — Telephone Encounter (Signed)
TH scheduled appt on 09/18/15 at 4 with Marcos EkeGreg Calone FNP.

## 2015-09-18 NOTE — Progress Notes (Signed)
Subjective:    Patient ID: Abigail [name], female    DOB: Jul 15, 1960, 55 y.o.   MRN: [id number]  Chief Complaint  Patient presents with  . Tingling    was seen monday and was dx with migraine, was given a shot and medication and felt better, woke up this morning with tingling in her left cheek and vision a little blurred in left eye     HPI:  Abigail [name] is a 55 y.o. female who  has a past medical history of GERD (gastroesophageal reflux disease); Hypertension; Photosensitivity; Pancreatitis; Cholecystitis; and Alcoholism (HCC). and presents today for an acute office visit.   Associated symptom of a headache described as a sharp, shooting pain in her occipital are that started about 5 days ago and felt like a stinging sensation in her left ear with pressure behind her left eye and haziness of her vision. Endorses sensitivity to light and sound, with associated nausea and no vomiting. Modifying factors include ibuprofen which "took the edge off." Seen in the office on Monday with a normal neurological exam and diagnosed with migraine headache. She was treated with sumatriptan and dexamethasone. Reports the Imitrex has helped a little. This morning she woke up experiencing numbness and tingling located in her left cheek and her vision is slightly blurry in the left eye.    Allergies  Allergen Reactions  . Latex Rash    "hands turn beat red and start to itch"  . Atorvastatin Other (See Comments)  . Corticosteroids Other (See Comments)  . Dilaudid [Hydromorphone Hcl] Other (See Comments)    Itching- no rash- likely side effect but not true allergy.  Tolerates oxycodone.   . Lisinopril Other (See Comments)    Hives, presumed allergy  . Mirtazapine Swelling    swelling  . Nsaids     Gastritis 2013  . Prednisone     Mood changes.      Current Outpatient Prescriptions on File Prior to Visit  Medication Sig Dispense Refill  . buPROPion (WELLBUTRIN SR) 150 MG 12 hr tablet Take 1 tablet  (150 mg total) by mouth daily.    . metoprolol (LOPRESSOR) 50 MG tablet Take 1 tablet (50 mg total) by mouth 2 (two) times daily. 180 tablet 3  . pantoprazole (PROTONIX) 40 MG tablet Take 1 tablet (40 mg total) by mouth daily. 90 tablet 3  . promethazine (PHENERGAN) 25 MG tablet Take 1 tablet (25 mg total) by mouth every 6 (six) hours as needed for nausea or vomiting. 30 tablet 2   No current facility-administered medications on file prior to visit.     Past Surgical History  Procedure Laterality Date  . Appendectomy      As a child  . Tonsillectomy      As a child  . Nsvd      X 2  . Tubal ligation  Late 20's  . Foot surgery      Pin in small toe  . Doppler echocardiography      Secondary to Redux, wnl  . Dobutamine stress echo  11/00    wnl  . Cat scan  11/00    wnl  . Esophagogastroduodenoscopy  8/01    Esophagitis [name]([facility])  . Sbft  08/01    wnl  . 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 Cholecystectomy  04/20/2012    Procedure: LAPAROSCOPIC CHOLECYSTECTOMY WITH INTRAOPERATIVE CHOLANGIOGRAM;  Surgeon: Robyne AskewPaul S Toth III, MD;  [facility];  Service: General;  Laterality: N/A;    Review of Systems  Constitutional: Negative for fever and chills.  Musculoskeletal: Negative for neck pain.  Neurological: Positive for numbness and headaches. Negative for weakness.      Objective:    BP 128/88 mmHg  Pulse 56  Temp(Src) 98.4 F (36.9 C) (Oral)  Resp 16  Ht  (1.549 m)  Wt 196 lb (88.905 kg)  BMI 37.05 kg/m2  SpO2 98%  LMP  (LMP Unknown) Nursing note and vital signs reviewed.  Physical Exam  Constitutional: She is oriented to person, place, and time. She appears well-developed and well-nourished. No distress.  Eyes: Conjunctivae and EOM are normal. Pupils are equal, round, and reactive to light.  Cardiovascular: Normal rate, regular rhythm, normal heart sounds and intact distal pulses.   Pulmonary/Chest: Effort normal and breath sounds normal.    Neurological: She is alert and oriented to person, place, and time. A cranial nerve deficit (Decreased sensory function of left trigeminal nerve to touch) is present.  Skin: Skin is warm and dry.  Psychiatric: She has a normal mood and affect. Her behavior is normal. Judgment and thought content normal.       Assessment & Plan:   Problem List Items Addressed This Visit      Cardiovascular and Mediastinum   Other migraine without status migrainosus, not intractable - Primary    Symptoms slightly improved since previous treatment with new onset decreased sensation of left trigeminal nerve. There is concern symptoms greater than migraine headache with possibilities including a CVA or other intracranial pathology. She is out of the window for aggressive tPA therapy as it has been 9+ hours since initial symptom onset. Obtain CT scan to rule out intracranial lesions or pathology. Neurological exam is otherwise benign. In office injection of depo-medrol and Toradol provided. Strict follow up precautions given to seek additional care for any worsening of symptoms. Follow up with PCP pending results of CT scan.       Relevant Medications   SUMAtriptan (IMITREX) 100 MG tablet   Other Relevant Orders   CT Head Wo Contrast       I am having Ms. Suchecki maintain her buPROPion, metoprolol, pantoprazole, promethazine, and SUMAtriptan.   Meds ordered this encounter  Medications  . DISCONTD: SUMAtriptan (IMITREX) 100 MG tablet    Sig: Take 1 tablet by mouth at the onset of headache.  May repeat in 2 hours if headache persists or recurs.    Dispense:  10 tablet    Refill:  0    Order Specific Question:  Supervising Provider    Answer:  Hillard Danker A [[id number]]  . SUMAtriptan (IMITREX) 100 MG tablet    Sig: Take 1 tablet by mouth at the onset of headache.  May repeat in 2 hours if headache persists or recurs.    Dispense:  10 tablet    Refill:  0    Order Specific Question:  Supervising  Provider    Answer:  Hillard Danker A [[id number]]     Follow-up: Return if symptoms worsen or fail to improve.  Jeanine Luz, FNP

## 2015-09-18 NOTE — Progress Notes (Signed)
Pre visit review using our clinic review tool, if applicable. No additional management support is needed unless otherwise documented below in the visit note. 

## 2015-09-18 NOTE — Telephone Encounter (Signed)
Patient Name: [name] DOB: 12/23/1960 Initial Comment Caller states he was seen on Monday for a migrane. Now she's having pressure behind her eye, and some vision changes yesterday. Left side of her face is numbness. Pins and needles. Nurse Assessment Nurse: Yetta BarreJones, RN, Miranda Date/Time (Eastern Time): 09/18/2015 12:33:52 PM Confirm and document reason for call. If symptomatic, describe symptoms. You must click the next button to save text entered. ---Caller states she was seen by MD on Monday for a migraine. She continues to have the headache, pressure behind her left eye, vision changes. She is now having constant tingling in the left side of her face. Last dose of Imitrex was 30 min ago. Has the patient traveled out of the country within the last 30 days? ---Not Applicable Does the patient have any new or worsening symptoms? ---Yes Will a triage be completed? ---Yes Related visit to physician within the last 2 weeks? ---Yes Does the PT have any chronic conditions? (i.e. diabetes, asthma, etc.) ---Yes List chronic conditions. ---Depression/anxiety, HTN Is the patient pregnant or possibly pregnant? (Ask all females between the ages of 7112-55) ---No Is this a behavioral health or substance abuse call? ---No Guidelines Guideline Title Affirmed Question Affirmed Notes Headache [1] SEVERE headache (e.g., excruciating) AND [2] not improved after 2 hours of pain medicine Final Disposition User See Physician within 4 Hours (or PCP triage) Yetta BarreJones, RN, Miranda Comments NO appt available with PCP or at primary office location, [facility] Carver Filaalone for 4:00pm Referrals GO TO FACILITY OTHER - SPECIFY Disagree/Comply: Comply

## 2015-09-19 ENCOUNTER — Ambulatory Visit (INDEPENDENT_AMBULATORY_CARE_PROVIDER_SITE_OTHER)
Admission: RE | Admit: 2015-09-19 | Discharge: 2015-09-19 | Disposition: A | Discharge status: Home / Self Care | Payer: BLUE CROSS/BLUE SHIELD | Source: Ambulatory Visit | Attending: Family | Admitting: Family

## 2015-09-19 ENCOUNTER — Telehealth: Payer: Self-pay | Admitting: Family

## 2015-09-19 DIAGNOSIS — G43809 Other migraine, not intractable, without status migrainosus: Secondary | ICD-10-CM | POA: Diagnosis not present

## 2015-09-19 MED ORDER — KETOROLAC TROMETHAMINE 60 MG/2ML IM SOLN
60.0000 mg | Freq: Once | INTRAMUSCULAR | Status: AC
  Administered 2015-09-19: 60 mg via INTRAMUSCULAR

## 2015-09-19 MED ORDER — METHYLPREDNISOLONE ACETATE 80 MG/ML IJ SUSP
80.0000 mg | Freq: Once | INTRAMUSCULAR | Status: AC
  Administered 2015-09-19: 80 mg via INTRAMUSCULAR

## 2015-09-19 NOTE — Addendum Note (Signed)
Addended by: Etheleen MayhewOX, HEATHER C on: 09/19/2015 10:02 AM   Modules accepted: Orders

## 2015-09-19 NOTE — Telephone Encounter (Signed)
Spoke with patient regarding negative CT results with continued concern for possible Trigeminal dysfunction. No significant improvement from Imitrex or DepoMedrol. Refer to neurology for further assessment and management.

## 2015-09-26 ENCOUNTER — Encounter: Payer: Self-pay | Admitting: Family Medicine

## 2015-09-26 ENCOUNTER — Ambulatory Visit (INDEPENDENT_AMBULATORY_CARE_PROVIDER_SITE_OTHER): Payer: BLUE CROSS/BLUE SHIELD | Admitting: Family Medicine

## 2015-09-26 ENCOUNTER — Ambulatory Visit (HOSPITAL_COMMUNITY)
Admission: RE | Admit: 2015-09-26 | Discharge: 2015-09-26 | Disposition: A | Discharge status: Home / Self Care | Payer: BLUE CROSS/BLUE SHIELD | Source: Ambulatory Visit | Attending: Family Medicine | Admitting: Family Medicine

## 2015-09-26 VITALS — BP 146/84 | HR 60 | Temp 98.5°F | Ht 61.0 in | Wt 193.2 lb

## 2015-09-26 DIAGNOSIS — R930 Abnormal findings on diagnostic imaging of skull and head, not elsewhere classified: Secondary | ICD-10-CM

## 2015-09-26 DIAGNOSIS — H539 Unspecified visual disturbance: Secondary | ICD-10-CM | POA: Diagnosis not present

## 2015-09-26 DIAGNOSIS — R519 Headache, unspecified: Secondary | ICD-10-CM

## 2015-09-26 DIAGNOSIS — R51 Headache: Secondary | ICD-10-CM

## 2015-09-26 DIAGNOSIS — R2990 Unspecified symptoms and signs involving the nervous system: Secondary | ICD-10-CM | POA: Insufficient documentation

## 2015-09-26 DIAGNOSIS — R208 Other disturbances of skin sensation: Secondary | ICD-10-CM

## 2015-09-26 DIAGNOSIS — G43809 Other migraine, not intractable, without status migrainosus: Secondary | ICD-10-CM

## 2015-09-26 DIAGNOSIS — R938 Abnormal findings on diagnostic imaging of other specified body structures: Secondary | ICD-10-CM | POA: Insufficient documentation

## 2015-09-26 DIAGNOSIS — R200 Anesthesia of skin: Secondary | ICD-10-CM

## 2015-09-26 LAB — POCT I-STAT CREATININE: CREATININE: 0.7 mg/dL (ref 0.44–1.00)

## 2015-09-26 MED ORDER — METHYLPREDNISOLONE ACETATE 80 MG/ML IJ SUSP
80.0000 mg | Freq: Once | INTRAMUSCULAR | Status: AC
  Administered 2015-09-26: 80 mg via INTRAMUSCULAR

## 2015-09-26 MED ORDER — GADOBENATE DIMEGLUMINE 529 MG/ML IV SOLN
20.0000 mL | Freq: Once | INTRAVENOUS | Status: AC | PRN
  Administered 2015-09-26: 17 mL via INTRAVENOUS

## 2015-09-26 MED ORDER — DIAZEPAM 5 MG PO TABS: ORAL_TABLET | ORAL | Status: DC

## 2015-09-26 MED ORDER — ONDANSETRON HCL 4 MG/2ML IJ SOLN
4.0000 mg | Freq: Once | INTRAMUSCULAR | Status: AC
  Administered 2015-09-26: 4 mg via INTRAMUSCULAR

## 2015-09-26 MED ORDER — ONDANSETRON HCL 4 MG/2ML IJ SOLN
2.0000 mg | Freq: Once | INTRAMUSCULAR | Status: AC
  Administered 2015-09-26: 2 mg via INTRAMUSCULAR

## 2015-09-26 MED ORDER — SUMATRIPTAN SUCCINATE 100 MG PO TABS: ORAL_TABLET | ORAL | Status: DC

## 2015-09-26 MED ORDER — KETOROLAC TROMETHAMINE 60 MG/2ML IM SOLN
60.0000 mg | Freq: Once | INTRAMUSCULAR | Status: AC
  Administered 2015-09-26: 60 mg via INTRAMUSCULAR

## 2015-09-26 NOTE — Progress Notes (Addendum)
Dr. Karleen Hampshire T. Montgomery Favor, MD, CAQ [facility] [address] [place], [address] Phone: [phone] Fax: [phone]  09/26/2015  Patient: Abigail [name], MRN: [id number], DOB: 23-Dec-1960, 55 y.o.  Primary Physician:  Crawford Givens, MD   Chief Complaint  Patient presents with  . Headache    seen twice last week   Subjective:   Abigail [name] is a 55 y.o. very pleasant female patient who presents with the following:  The patient presents with acute headache that is been ongoing now for 10 days. She saw Dr. Glendell Docker on 09/16/2015 and he gave her Imitrex 50 milligram tablets, and additionally if her Decadron 10 milligrams in the office.  Later in the week, she saw Mr. Stephens November on 09/18/2015, and at that point he felt again in this was a migraine headache. She was given Toradol 60 milligrams and Depo-Medrol 80 milligrams at that point. After all of these, her symptoms improved, but her headache has returned.  20 years ago had an MRI scan, had some ? Cluster headaches.  None really since then. Notable that her headaches have been absent for at least 20 years, and she has never been diagnosed with a history of migraine headaches in the past. She is 55 years of age, and no history of migraine headaches in the family.  L side and the crown on her head, leaning on it the pressure hurt. [name] on the front ear.  Head is very tender.  L eye visual changes last week She also has decreased sensation on the left side of her face.  Imitrex at 100 mg helped quite a bit.   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  Past Medical History, Surgical History, Social History, Family History, Problem List, Medications, and Allergies have been reviewed and updated if relevant.  Patient Active Problem List   Diagnosis Date Noted  . Other migraine without status migrainosus, not intractable 09/17/2015  . Breast lesion 04/18/2015  . Low grade fever  01/01/2015  . Abdominal pain, epigastric 01/01/2015  . Sciatica 10/10/2014  . At high risk for caregiver role strain 06/05/2014  . Routine general medical examination at a health care facility 06/05/2014  . Advance care planning 06/05/2014  . Pain in joint, shoulder region 07/19/2013  . History of alcohol abuse 09/13/2012  . Cholecystitis with cholelithiasis 07/01/2012  . Biliary colic 04/19/2012  . Anxiety 04/10/2012  . Pulmonary nodules 04/10/2012  . Gastritis 03/17/2012  . Pancreatitis, acute 03/13/2012  . Transaminitis 05/17/2011  . Other screening mammogram 05/17/2011  . B12 deficiency 01/31/2010  . ARTHRALGIA 01/31/2010  . Essential hypertension 08/16/2006  . ASTHMA, EXTRINSIC NOS 08/16/2006  . ESOPHAGITIS 08/16/2006  . GERD 08/16/2006    Past Medical History  Diagnosis Date  . GERD (gastroesophageal reflux disease)   . Hypertension   . Photosensitivity     (No formal dx of lupus) prev eval Dr. Gavin Potters in [place]  . Pancreatitis     2013 alcohol and/or gallstone  . Cholecystitis   . Alcoholism [facility])     Past Surgical History  Procedure Laterality Date  . Appendectomy      As a child  . Tonsillectomy      As a child  . Nsvd      X 2  . Tubal ligation  Late 20's  . Foot surgery      Pin in small toe  . Doppler echocardiography  Secondary to Redux, wnl  . Dobutamine stress echo  11/00    wnl  . Cat scan  11/00    wnl  . Esophagogastroduodenoscopy  8/01    Esophagitis [name])  . Sbft  08/01    wnl  . 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 Cholecystectomy  04/20/2012    Procedure: LAPAROSCOPIC CHOLECYSTECTOMY WITH INTRAOPERATIVE CHOLANGIOGRAM;  Surgeon: Robyne Askew, MD;  Location: [facility] OR;  Service: General;  Laterality: N/A;    Social History   Social History  . Marital Status: Divorced    Spouse Name: N/A  . Number of Children: 2  . Years of Education: N/A   Occupational History  . Dental Hygienist, [employer]    Social  History Main Topics  . Smoking status: Never Smoker   . Smokeless tobacco: Never Used  . Alcohol Use: Yes     Comment: rare as of 2016  . Drug Use: No  . Sexual Activity: Not on file   Other Topics Concern  . Not on file   Social History Narrative   Divorced, 2 grown children   Armed forces operational officer, [employer]    Family History  Problem Relation Age of Onset  . Heart disease Mother     CAD, angioplasty twice with stents  . Cancer Mother     Breast  . Breast cancer Mother 61  . Hypertension Father   . Hypothyroidism Father   . Heart disease Father     MI at age 49  . Cancer Father     H/O renal CA  . Diabetes Sister   . Cancer Sister     Renal CA  . Colon cancer Neg Hx     Allergies  Allergen Reactions  . Latex Rash    "hands turn beat red and start to itch"  . Atorvastatin Other (See Comments)  . Corticosteroids Other (See Comments)  . Dilaudid [Hydromorphone Hcl] Other (See Comments)    Itching- no rash- likely side effect but not true allergy.  Tolerates oxycodone.   . Lisinopril Other (See Comments)    Hives, presumed allergy  . Mirtazapine Swelling    swelling  . Nsaids     Gastritis 2013  . Prednisone     Mood changes.     Medication list reviewed and updated in full in [facility].  ROS   GEN: No fevers, chills. Nontoxic. Primarily MSK c/o today. MSK: Detailed in the HPI GI: tolerating PO intake without difficulty Neuro: detailed above Otherwise the pertinent positives of the ROS are noted above.    Objective:   BP 146/84 mmHg  Pulse 60  Temp(Src) 98.5 F (36.9 C) (Oral)  Ht  (1.549 m)  Wt 193 lb 4 oz (87.658 kg)  BMI 36.53 kg/m2  LMP  (LMP Unknown)   GEN: WDWN, NAD, Non-toxic, A & O x 3 HEENT: Atraumatic, Normocephalic. Neck supple. No masses, No LAD. Ears and Nose: No external deformity. CV: RRR, No M/G/R. No JVD. No thrill. No extra heart sounds. PULM: CTA B, no wheezes, crackles, rhonchi. No retractions. No resp. distress. No  accessory muscle use. ABD: S, NT, ND, +BS. No rebound tenderness. No HSM.  EXTR: No c/c/e  Neuro: Cranial nerve II through XII are grossly intact with the exception of left-sided trigeminal nerve distribution decreased sensation on the left side of the face.Marland Kitchen PERRLA. EOMI. Sensation intact throughout. Str 5/5 all extremities. DTR 2+. No clonus. A and o x 4. Romberg neg.  Finger nose neg. Heel -shin neg.   PSYCH: Normally interactive. Conversant. Not depressed or anxious appearing.  Calm demeanor.     Radiology: Ct Head Wo Contrast  09/19/2015  CLINICAL DATA:  Left occipital headache with nausea blurred vision for 1 week. Left base tingling yesterday. EXAM: CT HEAD WITHOUT CONTRAST TECHNIQUE: Contiguous axial images were obtained from the base of the skull through the vertex without intravenous contrast. COMPARISON:  None. FINDINGS: The ventricles are normal in size and configuration. There are no parenchymal masses or mass effect, no evidence of an infarct, no extra-axial masses or abnormal fluid collections and no intracranial hemorrhage. The visualized sinuses and mastoid air cells are clear. No skull lesion. IMPRESSION: Normal unenhanced CT scan of the brain. Electronically Signed   By: Amie Portlandavid  Ormond M.D.   On: 09/19/2015 13:35   Mr Laqueta JeanBrain W WJWo Contrast  09/26/2015  ADDENDUM REPORT: 09/26/2015 20:39 ADDENDUM: Study discussed by telephone with Dr. Hannah BeatSPENCER Marley Pakula on 09/26/2015 at 20:39 . Electronically Signed   By: Odessa FlemingH  Hall M.D.   On: 09/26/2015 20:39  09/26/2015  CLINICAL DATA:  55 year old female with acute intractable headache. Visual changes. Left facial tingling. Nausea. Initial encounter. EXAM: MRI HEAD WITHOUT AND WITH CONTRAST TECHNIQUE: Multiplanar, multiecho pulse sequences of the brain and surrounding structures were obtained without and with intravenous contrast. CONTRAST:  17 mL MultiHance COMPARISON:  Head CT without contrast 09/19/2015. FINDINGS: Cerebral volume is within normal limits.  No restricted diffusion to suggest acute infarction. No midline shift, mass effect, evidence of mass lesion, ventriculomegaly, extra-axial collection or acute intracranial hemorrhage. Cervicomedullary junction and pituitary are within normal limits. Major intracranial vascular flow voids are preserved. Minimal nonspecific anterior frontal lobe T2 and FLAIR hyperintensity in the cerebral white matter (series 7, image 15) is to an extent which is within normal limits for this age. No cortical encephalomalacia or chronic cerebral blood products. Wallace CullensGray and white matter signal elsewhere is normal. Deep gray matter nuclei, brainstem, and cerebellum are normal. The left stylomastoid foramen appears normal and the left parotid gland appears normal. However, there is asymmetric enhancement of the mastoid portion of the left seventh nerve (series 11, image 15) and also more subtle asymmetric enhancement in the left IAC (image 17). Visualized left side internal auditory structures otherwise appear normal. Visualized right side internal auditory structures appear normal. No mastoid fluid. No other abnormal intracranial enhancement identified. No dural thickening. No abnormal enhancement of the fifth nerve cisternal segments or Meckel's cave. No abnormality of the cavernous sinus identified. The proximal V3 and V2 trunks appear symmetric and within normal limits. No orbit soft tissue abnormality identified. No scalp or face soft tissue abnormality identified. Hyperostosis of the calvarium. No suspicious marrow lesion. Negative visualized cervical spine. IMPRESSION: 1. Subtle asymmetric enlargement in the left internal auditory canal and along the left facial nerve mastoid segment. This might reflect a viral left seventh cranial neuritis in this clinical setting. Follow-up dedicated IAC Protocol Head MRI Without And With Contrast may be valuable. 2. Otherwise normal for age MRI appearance of the brain. Visible bilateral fifth  cranial nerve segments appear within normal limits. Electronically Signed: By: Odessa FlemingH  Hall M.D. On: 09/26/2015 19:52    Assessment and Plan:   Acute intractable headache, unspecified headache type - Plan: MR Brain W Wo Contrast, methylPREDNISolone acetate (DEPO-MEDROL) injection 80 mg, ondansetron (ZOFRAN) injection 4 mg, ondansetron (ZOFRAN) injection 2 mg, ketorolac (TORADOL) injection 60 mg  Visual changes - Plan: MR Brain W Wo Contrast  Abnormal neurological exam - Plan: MR Brain W Wo Contrast  Left facial numbness - Plan: MR Brain W Wo Contrast  Other migraine without status migrainosus, not intractable - Plan: SUMAtriptan (IMITREX) 100 MG tablet  Greater than 10 days of intractable headache in a patient who does not have a known history of migraine with a lightninglike strike history of headache pain, visual changes, abnormal neurological exam, abnormal trigeminal nerve function, facial numbness, and poor response to traditional care.  In this case, I do not think that you can assume migraine pathology. CT of the head without contrast is an excellent imaging study to rule out intracranial hemorrhage. This has been done and is negative. This does not exclude ischemic stroke, demyelinating disease, or neoplastic origin of symptoms. Obtain an MRI of the brain with and without contrast to evaluate for the above.  She was symptomatically treated in the office. Refill Imitrex.  Addendum: Question cranial nerve seven irregularity or neuritis - I am going to have the patient follow-up with neurology.  Follow-up: No Follow-up on file.  New Prescriptions   DIAZEPAM (VALIUM) 5 MG TABLET    Take 30 mins before MRI   Modified Medications   Modified Medication Previous Medication   SUMATRIPTAN (IMITREX) 100 MG TABLET SUMAtriptan (IMITREX) 100 MG tablet      Take 1 tablet by mouth at the onset of headache.  May repeat in 2 hours if headache persists or recurs. Max 2 / 24 hours    Take 1 tablet by  mouth at the onset of headache.  May repeat in 2 hours if headache persists or recurs.   Orders Placed This Encounter  Procedures  . MR Brain W Wo Contrast    Signed,  Karleen HampshireSpencer T. Malerie Eakins, MD   Patient's Medications  New Prescriptions   DIAZEPAM (VALIUM) 5 MG TABLET    Take 30 mins before MRI  Previous Medications   BUPROPION (WELLBUTRIN SR) 150 MG 12 HR TABLET    Take 1 tablet (150 mg total) by mouth daily.   METOPROLOL (LOPRESSOR) 50 MG TABLET    Take 1 tablet (50 mg total) by mouth 2 (two) times daily.   PANTOPRAZOLE (PROTONIX) 40 MG TABLET    Take 1 tablet (40 mg total) by mouth daily.   PROMETHAZINE (PHENERGAN) 25 MG TABLET    Take 1 tablet (25 mg total) by mouth every 6 (six) hours as needed for nausea or vomiting.  Modified Medications   Modified Medication Previous Medication   SUMATRIPTAN (IMITREX) 100 MG TABLET SUMAtriptan (IMITREX) 100 MG tablet      Take 1 tablet by mouth at the onset of headache.  May repeat in 2 hours if headache persists or recurs. Max 2 / 24 hours    Take 1 tablet by mouth at the onset of headache.  May repeat in 2 hours if headache persists or recurs.  Discontinued Medications   No medications on file

## 2015-09-26 NOTE — Patient Instructions (Signed)

## 2015-09-26 NOTE — Progress Notes (Signed)
Pre visit review using our clinic review tool, if applicable. No additional management support is needed unless otherwise documented below in the visit note. 

## 2015-09-27 NOTE — Addendum Note (Signed)
Addended by: Hannah BeatOPLAND, Drexel Ivey on: 09/27/2015 11:36 AM   Modules accepted: Orders

## 2015-10-02 ENCOUNTER — Ambulatory Visit (INDEPENDENT_AMBULATORY_CARE_PROVIDER_SITE_OTHER): Payer: BLUE CROSS/BLUE SHIELD | Admitting: Diagnostic Neuroimaging

## 2015-10-02 ENCOUNTER — Encounter: Payer: Self-pay | Admitting: Diagnostic Neuroimaging

## 2015-10-02 VITALS — BP 135/84 | HR 56 | Ht 61.0 in | Wt 194.0 lb

## 2015-10-02 DIAGNOSIS — B0221 Postherpetic geniculate ganglionitis: Secondary | ICD-10-CM | POA: Diagnosis not present

## 2015-10-02 DIAGNOSIS — B028 Zoster with other complications: Secondary | ICD-10-CM

## 2015-10-02 MED ORDER — VALACYCLOVIR HCL 1 G PO TABS: 1000.0000 mg | ORAL_TABLET | Freq: Three times a day (TID) | ORAL | Status: DC

## 2015-10-02 MED ORDER — GABAPENTIN 300 MG PO CAPS: 300.0000 mg | ORAL_CAPSULE | Freq: Two times a day (BID) | ORAL | Status: DC

## 2015-10-02 MED ORDER — PREDNISONE 20 MG PO TABS: 60.0000 mg | ORAL_TABLET | Freq: Every day | ORAL | Status: DC

## 2015-10-02 NOTE — Progress Notes (Signed)
[facility]  PATIENT: Abigail [name] DOB: 07/14/1960  REFERRING CLINICIAN: S Copeland HISTORY FROM: patient  REASON FOR VISIT: new consult    HISTORICAL  CHIEF COMPLAINT:  Chief Complaint  Patient presents with  . Headache    rm 7, New Pt, "extreme left side HA lasting more than 10 days, began 3 weeks ago; results of MRI"    HISTORY OF PRESENT ILLNESS:   55 year old right-handed female here for evaluation of left-sided headaches and pain. May 2017 patient had sore throat and was diagnosed with viral infection. Symptoms resolved within a week or 2. In early June 2017, around June 9, patient had onset of pain in her left occipital region. She was then having intermittent lightening bolt sensations from top of her head into her left ear. Symptoms would last just 1-2 seconds at a time. Patient was having multiple episodes throughout the day, possibly over 100 per day. Patient having some blurred vision, nausea and vomiting, malaise sensation. Patient was evaluated by PCP clinic, tried on Imitrex and Decadron IM injections without relief. On 09/19/15 patient had CT scan of the head which is unremarkable. This is followed up with evaluation on 09/26/15 and then MRI of the brain was obtained which showed faint enhancement of the left facial nerve complex. Patient referred to me for further evaluation.  Patient is also noted decreased taste sensation as well as increased sensitivity to sound. Symptoms have slightly improved, approximate 60% back to normal, and now she has only 2-3 flashes of pain per day. Patient had noted that she had some wax buildup and dried blood in her left ear.  Patient has history of chickenpox at age 55 years old with "anaphylactic reaction" requiring hospitalization. No history of shingles reactivation.  Patient has history of "cluster headaches" when she was in her 30s. At that time she was having dull aching pain in her occipital and base of neck  region. Patient denies any nausea, vomiting, sensitivity to light or sound, unilateral symptoms, conjunctival injection, tearing or nasal congestion at that time. She was not tried on oxygen therapy. She was tried on anti-inflammatory and then symptoms resolved within a few years.    REVIEW OF SYSTEMS: Full 14 system review of systems performed and negative with exception of: Joint pain joint swelling cramps runny nose depression decreased energy confusion headache insomnia easy bruising eye pain weight gain fatigue.  ALLERGIES: Allergies  Allergen Reactions  . Latex Rash    "hands turn beat red and start to itch"  . Atorvastatin Other (See Comments)    Swollen joints  . Corticosteroids Other (See Comments)    Personality change  . Dilaudid [Hydromorphone Hcl] Other (See Comments)    Itching- no rash- likely side effect but not true allergy.  Tolerates oxycodone.   . Lisinopril Other (See Comments)    Hives, presumed allergy  . Mirtazapine Swelling    swelling  . Nsaids     Gastritis 2013  . Prednisone     Mood changes.     HOME MEDICATIONS: Outpatient Prescriptions Prior to Visit  Medication Sig Dispense Refill  . buPROPion (WELLBUTRIN SR) 150 MG 12 hr tablet Take 1 tablet (150 mg total) by mouth daily.    . diazepam (VALIUM) 5 MG tablet Take 30 mins before MRI 1 tablet 0  . metoprolol (LOPRESSOR) 50 MG tablet Take 1 tablet (50 mg total) by mouth 2 (two) times daily. 180 tablet 3  . pantoprazole (PROTONIX) 40 MG tablet Take  1 tablet (40 mg total) by mouth daily. 90 tablet 3  . promethazine (PHENERGAN) 25 MG tablet Take 1 tablet (25 mg total) by mouth every 6 (six) hours as needed for nausea or vomiting. 30 tablet 2  . SUMAtriptan (IMITREX) 100 MG tablet Take 1 tablet by mouth at the onset of headache.  May repeat in 2 hours if headache persists or recurs. Max 2 / 24 hours 10 tablet 3   No facility-administered medications prior to visit.    PAST MEDICAL HISTORY: Past Medical  History  Diagnosis Date  . GERD (gastroesophageal reflux disease)   . Hypertension   . Photosensitivity     (No formal dx of lupus) [facility] [place]  . Pancreatitis     2013 alcohol and/or gallstone  . Cholecystitis   . Alcoholism (HCC)     PAST SURGICAL HISTORY: Past Surgical History  Procedure Laterality Date  . Appendectomy      As a child  . Tonsillectomy      As a child  . Nsvd      X 2  . Tubal ligation  Late 20's  . Foot surgery      Pin in small toe  . Doppler echocardiography      Secondary to Redux, wnl  . Dobutamine stress echo  11/00    wnl  . Cat scan  11/00    wnl  . Esophagogastroduodenoscopy  8/01    Esophagitis [name]([place])  . Sbft  08/01    wnl  . 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 Cholecystectomy  04/20/2012    Procedure: LAPAROSCOPIC CHOLECYSTECTOMY WITH INTRAOPERATIVE CHOLANGIOGRAM;  Surgeon: Robyne AskewPaul S Toth III, MD;  Location[facility] OR;  Service: General;  Laterality: N/A;    FAMILY HISTORY: Family History  Problem Relation Age of Onset  . Heart disease Mother     CAD, angioplasty twice with stents  . Cancer Mother     Breast  . Breast cancer Mother [id number]  . Hypertension Mother   . Hypertension Father   . Hypothyroidism Father   . Heart disease Father     MI at age 53  . Cancer Father     H/O renal CA  . Diabetes Sister   . Cancer Sister     Renal CA  . Colon cancer Neg Hx     SOCIAL HISTORY:  Social History   Social History  . Marital Status: Divorced    Spouse Name: N/A  . Number of Children: 2  . Years of Education: 14   Occupational History  . 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 Smoking status: Never Smoker   . Smokeless tobacco: Never Used  . Alcohol Use: Yes     Comment: rare as of 2016, 10/02/15 3-4 glasses wine weekly  . Drug Use: No  . Sexual Activity: Not on file   Other Topics Concern  . Not on file   Social History Narrative   Divorced, 2 grown  children, lives with companion   Dental Hygienist   Caffeine use- coffee in morning, Diet DP all day     PHYSICAL EXAM  GENERAL EXAM/CONSTITUTIONAL: Vitals:  Filed Vitals:   10/02/15 1511  BP: 135/84  Pulse: 56  Height: 5\' 1"  (1.549 m)  Weight: 194 lb (87.998 kg)     Body mass index is 36.67 kg/(m^2).  Visual Acuity Screening   Right eye Left eye  Both eyes  Without correction: 20/30 20/30   With correction:        Patient is in no distress; well developed, nourished and groomed; neck is supple  DRIED BLOOD AND WAX IN LEFT EAR; NO VESICLES.  CARDIOVASCULAR:  Examination of carotid arteries is normal; no carotid bruits  Regular rate and rhythm, no murmurs  Examination of peripheral vascular system by observation and palpation is normal  EYES:  Ophthalmoscopic exam of optic discs and posterior segments is normal; no papilledema or hemorrhages  MUSCULOSKELETAL:  Gait, strength, tone, movements noted in Neurologic exam below  NEUROLOGIC: MENTAL STATUS:  No flowsheet data found.  awake, alert, oriented to person, place and time  recent and remote memory intact  normal attention and concentration  language fluent, comprehension intact, naming intact,   fund of knowledge appropriate  CRANIAL NERVE:   2nd - no papilledema on fundoscopic exam  2nd, 3rd, 4th, 6th - pupils equal and reactive to light, visual fields full to confrontation, extraocular muscles intact, no nystagmus  5th - facial sensation --> SLIGHTLY DECR TEMP IN LEFT V1, V2, V3  7th - facial strength symmetric  8th - hearing intact  9th - palate elevates symmetrically, uvula midline  11th - shoulder shrug symmetric  12th - tongue protrusion midline  MOTOR:   normal bulk and tone, full strength in the BUE, BLE  SENSORY:   normal and symmetric to light touch, temperature, vibration  COORDINATION:   finger-nose-finger, fine finger movements normal  REFLEXES:   deep tendon  reflexes present and symmetric  GAIT/STATION:   narrow based gait; able to tandem; romberg is negative    DIAGNOSTIC DATA (LABS, IMAGING, TESTING) - I reviewed patient records, labs, notes, testing and imaging myself where available.  Lab Results  Component Value Date   WBC 5.6 03/26/2015   HGB 13.0 03/26/2015   HCT 38.6 03/26/2015   MCV 96.7 03/26/2015   PLT 268.0 03/26/2015      Component Value Date/Time   NA 140 03/26/2015 1350   NA 136 06/16/2013 1851   K 4.0 03/26/2015 1350   K 3.6 06/17/2013 0809   CL 106 03/26/2015 1350   CL 102 06/16/2013 1851   CO2 27 03/26/2015 1350   CO2 20* 06/16/2013 1851   GLUCOSE 87 03/26/2015 1350   GLUCOSE 92 06/16/2013 1851   BUN 10 03/26/2015 1350   BUN 13 06/16/2013 1851   CREATININE 0.70 09/26/2015 1843   CREATININE 0.64 06/16/2013 1851   CALCIUM 9.3 03/26/2015 1350   CALCIUM 8.2* 06/16/2013 1851   PROT 6.9 03/26/2015 1350   PROT 7.9 06/16/2013 1851   ALBUMIN 3.6 03/26/2015 1350   ALBUMIN 3.9 06/16/2013 1851   AST 25 03/26/2015 1350   AST 48* 06/17/2013 0809   ALT 18 03/26/2015 1350   ALT 29 06/17/2013 0809   ALKPHOS 54 03/26/2015 1350   ALKPHOS 61 06/16/2013 1851   BILITOT 0.3 03/26/2015 1350   BILITOT 1.1* 06/16/2013 1851   GFRNONAA >60 03/21/2015 1340   GFRNONAA >60 06/16/2013 1851   GFRAA >60 03/21/2015 1340   GFRAA >60 06/16/2013 1851   Lab Results  Component Value Date   CHOL 208* 05/28/2014   HDL 105.30 05/28/2014   LDLCALC 67 05/28/2014   TRIG 177.0* 05/28/2014   CHOLHDL 2 05/28/2014   No results found for: HGBA1C Lab Results  Component Value Date   VITAMINB12 289 08/27/2015   Lab Results  Component Value Date   TSH 0.863 07/02/2006  06/17/13 EKG [I reviewed images myself and agree with interpretation. -VRP]  - Normal sinus rhythm - Prolonged QT (QTc 484) - Abnormal ECG - When compared with ECG of 16-Jun-2013 18:52, Vent. rate has decreased BY 33 BPM T wave inversion no longer evident in  Anterior leads  09/26/15 MRI brain [I reviewed images myself and agree with interpretation. -VRP]  1. Subtle asymmetric enlargement in the left internal auditory canal and along the left facial nerve mastoid segment. This might reflect a viral left seventh cranial neuritis in this clinical setting. Follow-up dedicated IAC Protocol Head MRI Without And With Contrast may be valuable. 2. Otherwise normal for age MRI appearance of the brain. Visible bilateral fifth cranial nerve segments appear within normal limits.    ASSESSMENT AND PLAN  55 y.o. year old female here with new onset pain in left ear, throat, scalp, face, associated with decreased taste and increased hearing sensitivity. Patient has slightly decreased sensation in left V1, V2, V3 distribution, dried blood and wax buildup in the left external auditory canal, history of chickenpox as a child. 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 Although symptoms started almost 3 weeks ago, patient still having significant pain and symptoms, therefore will start antiviral and prednisone treatment.   Dx:  1. 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 predniSONE (DELTASONE) 20 MG tablet    Sig: Take 3 tablets (60 mg total) by mouth daily with breakfast.    Dispense:  15 tablet    Refill:  0  . valACYclovir (VALTREX) 1000 MG tablet    Sig: Take 1 tablet (1,000 mg total) by mouth 3 (three) times daily.    Dispense:  21 tablet    Refill:  0  . gabapentin (NEURONTIN) 300 MG capsule    Sig: Take 1 capsule (300 mg total) by mouth 2 (two) times daily.    Dispense:  60 capsule    Refill:  3   Return in about 6 weeks (around  11/13/2015).  I reviewed images, labs, notes, records myself. I summarized findings and reviewed with patient, for this high risk condition (Ramsay Hunt syndrome) requiring high complexity decision making.     Suanne Marker, MD 10/02/2015, 4:09 PM Certified in Neurology, Neurophysiology and Neuroimaging  [facility] [address] [place], [place] [address] [phone]

## 2015-10-02 NOTE — Patient Instructions (Signed)
Thank you for coming to see [place] at [facility]. I hope we have been able to provide you high quality care today.  You may receive a patient satisfaction survey over the next few weeks. We would appreciate your feedback and comments so that we may continue to improve ourselves and the health of our patients.  - start prednisone 34m daily x 5 days  - start valacyclovir 10035mthree times per day x 7 days  - use gabapentin 30033mt bedtime; may increase to twice a day as needed   ~~~~~~~~~~~~~~~~~~~~~~~~~~~~~~~~~~~~~~~~~~~~~~~~~~~~~~~~~~~~~~~~~  DR. Ramata Strothman'S GUIDE TO HAPPY AND HEALTHY LIVING These are some of my general health and wellness recommendations. Some of them may apply to you better than others. Please use common sense as you try these suggestions and feel free to ask me any questions.   ACTIVITY/FITNESS Mental, social, emotional and physical stimulation are very important for brain and body health. Try learning a new activity (arts, music, language, sports, games).  Keep moving your body to the best of your abilities. You can do this at home, inside or outside, the park, community center, gym or anywhere you like. Consider a physical therapist or personal trainer to get started. Consider the app Sworkit. Fitness trackers such as smart-watches, smart-phones or Fitbits can help as well.   NUTRITION Eat more plants: colorful vegetables, nuts, seeds and berries.  Eat less sugar, salt, preservatives and processed foods.  Avoid toxins such as cigarettes and alcohol.  Drink water when you are thirsty. Warm water with a slice of lemon is an excellent morning drink to start the day.  Consider these websites for more information The Nutrition Source (htthttps://www.henry-hernandez.biz/recision Nutrition (wwwWindowBlog.ch RELAXATION Consider practicing mindfulness meditation or other relaxation techniques such as deep  breathing, prayer, yoga, tai chi, massage. See website mindful.org or the apps Headspace or Calm to help get started.   SLEEP Try to get at least 7-8+ hours sleep per day. Regular exercise and reduced caffeine will help you sleep better. Practice good sleep hygeine techniques. See website sleep.org for more information.   PLANNING Prepare estate planning, living will, healthcare POA documents. Sometimes this is best planned with the help of an attorney. Theconversationproject.org and agingwithdignity.org are excellent resources.

## 2015-10-05 DIAGNOSIS — K859 Acute pancreatitis without necrosis or infection, unspecified: Secondary | ICD-10-CM

## 2015-10-05 HISTORY — DX: Acute pancreatitis without necrosis or infection, unspecified: K85.90

## 2015-10-22 ENCOUNTER — Telehealth: Payer: Self-pay | Admitting: Family Medicine

## 2015-10-22 ENCOUNTER — Encounter: Payer: Self-pay | Admitting: Family Medicine

## 2015-10-22 ENCOUNTER — Ambulatory Visit (INDEPENDENT_AMBULATORY_CARE_PROVIDER_SITE_OTHER): Payer: BLUE CROSS/BLUE SHIELD | Admitting: Family Medicine

## 2015-10-22 VITALS — BP 102/68 | HR 63 | Temp 98.5°F | Wt 195.5 lb

## 2015-10-22 DIAGNOSIS — F419 Anxiety disorder, unspecified: Secondary | ICD-10-CM

## 2015-10-22 DIAGNOSIS — G43809 Other migraine, not intractable, without status migrainosus: Secondary | ICD-10-CM | POA: Diagnosis not present

## 2015-10-22 MED ORDER — CYCLOBENZAPRINE HCL 10 MG PO TABS: 5.0000 mg | ORAL_TABLET | Freq: Three times a day (TID) | ORAL | Status: DC | PRN

## 2015-10-22 MED ORDER — PREDNISONE 20 MG PO TABS: 60.0000 mg | ORAL_TABLET | Freq: Every day | ORAL | Status: DC

## 2015-10-22 MED ORDER — VALACYCLOVIR HCL 1 G PO TABS: 1000.0000 mg | ORAL_TABLET | Freq: Three times a day (TID) | ORAL | Status: DC

## 2015-10-22 NOTE — Patient Instructions (Addendum)
Restart promethazine and add on flexeril.  Sedation caution.  Lay down in a dark and quiet room.  If you have a return of the left sided facial pain, then restart the valtrex and prednisone.  Take care.  Glad to see you.

## 2015-10-22 NOTE — Progress Notes (Signed)
Pre visit review using our clinic review tool, if applicable. No additional management support is needed unless otherwise documented below in the visit note.  She didn't tolerate lower dose of wellbutrin.  Back on BID dosing.  D/w pt.    HA started yesterday.  She had a few "good" weeks prior to this.   HA with stabbing pain, intermittently initially.  Woke up today in constant pain on the top of the head.  Not nauseated.  Lights bother her.  Would prefer quiet dark room.  Imitrex did help a little with her prev headaches.    She hasn't had return the prev L sided facial pain- that resolved with prev steroids and antiviral.  No facial rash.  She was able to tolerate prednisone prev, d/w pt.  D/w pt about prev neuro eval and imaging.    Meds, vitals, and allergies reviewed.   ROS: Per HPI unless specifically indicated in ROS section   GEN: nad, alert and oriented HEENT: mucous membranes moist, TM wnl, nasal and OP exam wnl, PERRL EOMI NECK: supple w/o LA CV: rrr. PULM: ctab, no inc wob ABD: soft, +bs EXT: no edema SKIN: no acute rash on the fact CN 2-12 wnl B, S/S/DTR wnl B

## 2015-10-22 NOTE — Telephone Encounter (Signed)
Pt has appt 10/22/15 at 4 with Dr Para Marchuncan.

## 2015-10-22 NOTE — Telephone Encounter (Signed)
[facility] - Client TELEPHONE ADVICE RECORD [facility] Patient Name: Abigail DraftsSANDRA [name] DOB: 12-21-1960 Initial Comment Caller has a severe headache -- she sees a neuro Nurse Assessment Nurse: Debera Latalston, RN, Tinnie GensJeffrey Date/Time (Eastern Time): 10/22/2015 1:33:34 PM Confirm and document reason for call. If symptomatic, describe symptoms. You must click the next button to save text entered. ---Caller has a severe headache -- she sees a neurologist. Diagnosed with shingles about a month ago. Shingles are improved. Has the patient traveled out of the country within the last 30 days? ---No Does the patient have any new or worsening symptoms? ---Yes Will a triage be completed? ---Yes Related visit to physician within the last 2 weeks? ---No Does the PT have any chronic conditions? (i.e. diabetes, asthma, etc.) ---Yes List chronic conditions. ---HTN Is the patient pregnant or possibly pregnant? (Ask all females between the ages of 5912-55) ---No Is this a behavioral health or substance abuse call? ---No Guidelines Guideline Title Affirmed Question Affirmed Notes Headache [1] Headache AND [2] has not taken pain medications Final Disposition User [facility] [place], RN, Tinnie GensJeffrey Disagree/Comply: Comply

## 2015-10-23 NOTE — Assessment & Plan Note (Signed)
She didn't tolerate lower dose of wellbutrin.  Back on BID dosing.  D/w pt.  Would continue as is.

## 2015-10-23 NOTE — Assessment & Plan Note (Signed)
Would add on flexeril.  Still likely migraine.  Should she have return of prev L sided facial sx, then restart pred and antiviral.  I don't expect her to need this; she'll hold rx for now.   Use phenergan along with flexeril, rest, limit noise and light, update me as needed.   She has f/u with neuro pending and I'll route this as a FYI.   Greatly appreciate neuro help/input.  >25 minutes spent in face to face time with patient, >50% spent in counselling or coordination of care.

## 2015-10-26 ENCOUNTER — Emergency Department: Payer: BLUE CROSS/BLUE SHIELD

## 2015-10-26 ENCOUNTER — Inpatient Hospital Stay
Admission: EM | Admit: 2015-10-26 | Discharge: 2015-10-28 | DRG: 440 | Disposition: A | Discharge status: Home / Self Care | Payer: BLUE CROSS/BLUE SHIELD | Attending: Internal Medicine | Admitting: Internal Medicine

## 2015-10-26 ENCOUNTER — Encounter: Payer: Self-pay | Admitting: Emergency Medicine

## 2015-10-26 DIAGNOSIS — Z7952 Long term (current) use of systemic steroids: Secondary | ICD-10-CM

## 2015-10-26 DIAGNOSIS — Z8249 Family history of ischemic heart disease and other diseases of the circulatory system: Secondary | ICD-10-CM | POA: Diagnosis not present

## 2015-10-26 DIAGNOSIS — Z79899 Other long term (current) drug therapy: Secondary | ICD-10-CM

## 2015-10-26 DIAGNOSIS — Z91040 Latex allergy status: Secondary | ICD-10-CM

## 2015-10-26 DIAGNOSIS — K852 Alcohol induced acute pancreatitis without necrosis or infection: Principal | ICD-10-CM | POA: Diagnosis present

## 2015-10-26 DIAGNOSIS — J45909 Unspecified asthma, uncomplicated: Secondary | ICD-10-CM | POA: Diagnosis present

## 2015-10-26 DIAGNOSIS — F1011 Alcohol abuse, in remission: Secondary | ICD-10-CM | POA: Diagnosis present

## 2015-10-26 DIAGNOSIS — K859 Acute pancreatitis without necrosis or infection, unspecified: Secondary | ICD-10-CM | POA: Diagnosis present

## 2015-10-26 DIAGNOSIS — Z885 Allergy status to narcotic agent status: Secondary | ICD-10-CM

## 2015-10-26 DIAGNOSIS — Z9884 Bariatric surgery status: Secondary | ICD-10-CM | POA: Diagnosis not present

## 2015-10-26 DIAGNOSIS — F419 Anxiety disorder, unspecified: Secondary | ICD-10-CM | POA: Diagnosis present

## 2015-10-26 DIAGNOSIS — Z8719 Personal history of other diseases of the digestive system: Secondary | ICD-10-CM | POA: Diagnosis present

## 2015-10-26 DIAGNOSIS — G43909 Migraine, unspecified, not intractable, without status migrainosus: Secondary | ICD-10-CM | POA: Diagnosis present

## 2015-10-26 DIAGNOSIS — F1010 Alcohol abuse, uncomplicated: Secondary | ICD-10-CM | POA: Diagnosis present

## 2015-10-26 DIAGNOSIS — E538 Deficiency of other specified B group vitamins: Secondary | ICD-10-CM | POA: Diagnosis present

## 2015-10-26 DIAGNOSIS — I10 Essential (primary) hypertension: Secondary | ICD-10-CM | POA: Diagnosis present

## 2015-10-26 DIAGNOSIS — K219 Gastro-esophageal reflux disease without esophagitis: Secondary | ICD-10-CM | POA: Diagnosis present

## 2015-10-26 DIAGNOSIS — R1111 Vomiting without nausea: Secondary | ICD-10-CM

## 2015-10-26 DIAGNOSIS — Z888 Allergy status to other drugs, medicaments and biological substances status: Secondary | ICD-10-CM

## 2015-10-26 DIAGNOSIS — R1013 Epigastric pain: Secondary | ICD-10-CM | POA: Diagnosis present

## 2015-10-26 LAB — COMPREHENSIVE METABOLIC PANEL
ALK PHOS: 78 U/L (ref 38–126)
ALT: 23 U/L (ref 14–54)
AST: 30 U/L (ref 15–41)
Albumin: 3.7 g/dL (ref 3.5–5.0)
Anion gap: 11 (ref 5–15)
BUN: 20 mg/dL (ref 6–20)
CALCIUM: 8.6 mg/dL — AB (ref 8.9–10.3)
CHLORIDE: 104 mmol/L (ref 101–111)
CO2: 22 mmol/L (ref 22–32)
CREATININE: 0.53 mg/dL (ref 0.44–1.00)
Glucose, Bld: 103 mg/dL — ABNORMAL HIGH (ref 65–99)
Potassium: 4.1 mmol/L (ref 3.5–5.1)
SODIUM: 137 mmol/L (ref 135–145)
Total Bilirubin: 1 mg/dL (ref 0.3–1.2)
Total Protein: 7.2 g/dL (ref 6.5–8.1)

## 2015-10-26 LAB — CBC
HCT: 43.6 % (ref 35.0–47.0)
Hemoglobin: 15.3 g/dL (ref 12.0–16.0)
MCH: 33.6 pg (ref 26.0–34.0)
MCHC: 35.2 g/dL (ref 32.0–36.0)
MCV: 95.6 fL (ref 80.0–100.0)
PLATELETS: 274 10*3/uL (ref 150–440)
RBC: 4.55 MIL/uL (ref 3.80–5.20)
RDW: 13.8 % (ref 11.5–14.5)
WBC: 9.8 10*3/uL (ref 3.6–11.0)

## 2015-10-26 LAB — LIPASE, BLOOD: LIPASE: 279 U/L — AB (ref 11–51)

## 2015-10-26 LAB — TROPONIN I

## 2015-10-26 MED ORDER — DIATRIZOATE MEGLUMINE & SODIUM 66-10 % PO SOLN
15.0000 mL | Freq: Once | ORAL | Status: AC
  Administered 2015-10-26: 15 mL via ORAL

## 2015-10-26 MED ORDER — ONDANSETRON HCL 4 MG/2ML IJ SOLN
4.0000 mg | Freq: Once | INTRAMUSCULAR | Status: AC
  Administered 2015-10-26: 4 mg via INTRAVENOUS
  Filled 2015-10-26: qty 2

## 2015-10-26 MED ORDER — ONDANSETRON HCL 4 MG/2ML IJ SOLN
4.0000 mg | Freq: Four times a day (QID) | INTRAMUSCULAR | Status: DC | PRN
  Administered 2015-10-26 – 2015-10-27 (×3): 4 mg via INTRAVENOUS
  Filled 2015-10-26 (×3): qty 2

## 2015-10-26 MED ORDER — SODIUM CHLORIDE 0.9 % IV SOLN
INTRAVENOUS | Status: DC
  Administered 2015-10-26 – 2015-10-28 (×6): via INTRAVENOUS

## 2015-10-26 MED ORDER — PANTOPRAZOLE SODIUM 40 MG PO TBEC
40.0000 mg | DELAYED_RELEASE_TABLET | Freq: Every day | ORAL | Status: DC
Start: 2015-10-26 — End: 2015-10-28
  Administered 2015-10-26 – 2015-10-28 (×3): 40 mg via ORAL
  Filled 2015-10-26 (×3): qty 1

## 2015-10-26 MED ORDER — METOPROLOL TARTRATE 50 MG PO TABS
50.0000 mg | ORAL_TABLET | Freq: Two times a day (BID) | ORAL | Status: DC
  Administered 2015-10-26 – 2015-10-28 (×5): 50 mg via ORAL
  Filled 2015-10-26 (×5): qty 1

## 2015-10-26 MED ORDER — MORPHINE SULFATE (PF) 4 MG/ML IV SOLN
INTRAVENOUS | Status: AC
  Administered 2015-10-26: 4 mg via INTRAVENOUS
  Filled 2015-10-26: qty 1

## 2015-10-26 MED ORDER — FAMOTIDINE IN NACL 20-0.9 MG/50ML-% IV SOLN
20.0000 mg | Freq: Two times a day (BID) | INTRAVENOUS | Status: DC
  Administered 2015-10-26 – 2015-10-28 (×5): 20 mg via INTRAVENOUS
  Filled 2015-10-26 (×6): qty 50

## 2015-10-26 MED ORDER — SODIUM CHLORIDE 0.9 % IV SOLN
Freq: Once | INTRAVENOUS | Status: AC
  Administered 2015-10-26: 12:00:00 via INTRAVENOUS

## 2015-10-26 MED ORDER — ACETAMINOPHEN 650 MG RE SUPP: 650.0000 mg | Freq: Four times a day (QID) | RECTAL | Status: DC | PRN

## 2015-10-26 MED ORDER — DEXTROSE 5 % IV SOLN
1.0000 g | INTRAVENOUS | Status: DC
  Administered 2015-10-26 – 2015-10-27 (×2): 1 g via INTRAVENOUS
  Filled 2015-10-26 (×4): qty 10

## 2015-10-26 MED ORDER — ONDANSETRON HCL 4 MG/2ML IJ SOLN
4.0000 mg | Freq: Once | INTRAMUSCULAR | Status: DC
  Filled 2015-10-26: qty 2

## 2015-10-26 MED ORDER — ONDANSETRON HCL 4 MG/2ML IJ SOLN
4.0000 mg | Freq: Once | INTRAMUSCULAR | Status: AC | PRN
Start: 2015-10-26 — End: 2015-10-26
  Administered 2015-10-26: 4 mg via INTRAVENOUS
  Filled 2015-10-26: qty 2

## 2015-10-26 MED ORDER — PROMETHAZINE HCL 25 MG PO TABS
25.0000 mg | ORAL_TABLET | Freq: Four times a day (QID) | ORAL | Status: DC | PRN
Start: 2015-10-26 — End: 2015-10-28
  Administered 2015-10-26: 25 mg via ORAL
  Filled 2015-10-26: qty 1

## 2015-10-26 MED ORDER — HYDRALAZINE HCL 20 MG/ML IJ SOLN
10.0000 mg | INTRAMUSCULAR | Status: DC | PRN
  Administered 2015-10-26: 10 mg via INTRAVENOUS
  Filled 2015-10-26: qty 1

## 2015-10-26 MED ORDER — ONDANSETRON HCL 4 MG/2ML IJ SOLN
4.0000 mg | Freq: Once | INTRAMUSCULAR | Status: AC
  Administered 2015-10-26: 4 mg via INTRAVENOUS

## 2015-10-26 MED ORDER — CYCLOBENZAPRINE HCL 10 MG PO TABS: 5.0000 mg | ORAL_TABLET | Freq: Three times a day (TID) | ORAL | Status: DC | PRN

## 2015-10-26 MED ORDER — ONDANSETRON HCL 4 MG PO TABS: 4.0000 mg | ORAL_TABLET | Freq: Four times a day (QID) | ORAL | Status: DC | PRN

## 2015-10-26 MED ORDER — MORPHINE SULFATE (PF) 4 MG/ML IV SOLN
6.0000 mg | Freq: Once | INTRAVENOUS | Status: AC
  Administered 2015-10-26: 6 mg via INTRAVENOUS
  Filled 2015-10-26: qty 2

## 2015-10-26 MED ORDER — DOCUSATE SODIUM 100 MG PO CAPS
100.0000 mg | ORAL_CAPSULE | Freq: Two times a day (BID) | ORAL | Status: DC
  Administered 2015-10-26 – 2015-10-28 (×4): 100 mg via ORAL
  Filled 2015-10-26 (×4): qty 1

## 2015-10-26 MED ORDER — BISACODYL 10 MG RE SUPP: 10.0000 mg | Freq: Every day | RECTAL | Status: DC | PRN

## 2015-10-26 MED ORDER — BUPROPION HCL ER (SR) 150 MG PO TB12
150.0000 mg | ORAL_TABLET | Freq: Two times a day (BID) | ORAL | Status: DC
Start: 2015-10-26 — End: 2015-10-28
  Administered 2015-10-26 – 2015-10-28 (×4): 150 mg via ORAL
  Filled 2015-10-26 (×6): qty 1

## 2015-10-26 MED ORDER — MORPHINE SULFATE (PF) 4 MG/ML IV SOLN
4.0000 mg | INTRAVENOUS | Status: DC | PRN
  Administered 2015-10-26 – 2015-10-28 (×10): 4 mg via INTRAVENOUS
  Filled 2015-10-26 (×10): qty 1

## 2015-10-26 MED ORDER — MORPHINE SULFATE (PF) 4 MG/ML IV SOLN
4.0000 mg | Freq: Once | INTRAVENOUS | Status: AC
  Administered 2015-10-26: 4 mg via INTRAVENOUS
  Filled 2015-10-26: qty 1

## 2015-10-26 MED ORDER — METOCLOPRAMIDE HCL 5 MG/ML IJ SOLN
10.0000 mg | Freq: Once | INTRAMUSCULAR | Status: AC
  Administered 2015-10-26: 10 mg via INTRAVENOUS
  Filled 2015-10-26: qty 2

## 2015-10-26 MED ORDER — LORAZEPAM 2 MG/ML IJ SOLN: 1.0000 mg | INTRAMUSCULAR | Status: DC | PRN

## 2015-10-26 MED ORDER — IOPAMIDOL (ISOVUE-300) INJECTION 61%
100.0000 mL | Freq: Once | INTRAVENOUS | Status: AC | PRN
  Administered 2015-10-26: 100 mL via INTRAVENOUS

## 2015-10-26 MED ORDER — HEPARIN SODIUM (PORCINE) 5000 UNIT/ML IJ SOLN
5000.0000 [IU] | Freq: Three times a day (TID) | INTRAMUSCULAR | Status: DC
  Administered 2015-10-26 – 2015-10-28 (×6): 5000 [IU] via SUBCUTANEOUS
  Filled 2015-10-26 (×6): qty 1

## 2015-10-26 MED ORDER — ACETAMINOPHEN 325 MG PO TABS: 650.0000 mg | ORAL_TABLET | Freq: Four times a day (QID) | ORAL | Status: DC | PRN

## 2015-10-26 MED ORDER — FAMOTIDINE IN NACL 20-0.9 MG/50ML-% IV SOLN
INTRAVENOUS | Status: AC
  Administered 2015-10-26: 20 mg via INTRAVENOUS
  Filled 2015-10-26: qty 50

## 2015-10-26 NOTE — ED Notes (Signed)
This RN introduced self to patient. Pt resting in bed at this time. VSS. Pt c/o nausea and returning pain at this time. Will continue to monitor.

## 2015-10-26 NOTE — ED Notes (Signed)
Patient reports that she has upper abd pain, nausea and vomiting that started last night. Patient states that she has a history of pancreatitis and the pain is the same. Patient states that she drank 2 beers yesterday.

## 2015-10-26 NOTE — ED Notes (Signed)
Pt taken to CT at this time.

## 2015-10-26 NOTE — ED Notes (Signed)
NAD noted at this time. Delay explained to patient. Pt states understanding at this time. Will continue to monitor for further patient needs.

## 2015-10-26 NOTE — ED Notes (Signed)
Pt returned from CT at this time.  

## 2015-10-26 NOTE — ED Provider Notes (Signed)
[facility] Emergency Department Provider Note   ____________________________________________  Time seen: Approximately 9:03 AM  I have reviewed the triage vital signs and the nursing notes.   HISTORY  Chief Complaint Emesis and Abdominal Pain   HPI Abigail [name] is a 55 y.o. female reports she had 2 beers yesterday. Last night she began having vomiting and upper abdominal pain that feels like her past pancreatitis. Pain radiates slightly up in towards the chest and into the back. She does not have any shortness of breath or sweating. Pain is worse with palpation better she lays still somewhat but not much better  )**} Past Medical History  Diagnosis Date  . GERD (gastroesophageal reflux disease)   . Hypertension   . Photosensitivity     (No formal dx of lupus) prev eval Dr. Gavin Potters in [place]  . Pancreatitis     2013 alcohol and/or gallstone  . Cholecystitis   . Alcoholism [facility])     Patient Active Problem List   Diagnosis Date Noted  . Other migraine without status migrainosus, not intractable 09/17/2015  . Breast lesion 04/18/2015  . Low grade fever 01/01/2015  . Abdominal pain, epigastric 01/01/2015  . Sciatica 10/10/2014  . At high risk for caregiver role strain 06/05/2014  . Routine general medical examination at a health care facility 06/05/2014  . Advance care planning 06/05/2014  . Pain in joint, shoulder region 07/19/2013  . History of alcohol abuse 09/13/2012  . Cholecystitis with cholelithiasis 07/01/2012  . Biliary colic 04/19/2012  . Anxiety 04/10/2012  . Pulmonary nodules 04/10/2012  . Gastritis 03/17/2012  . Pancreatitis, acute 03/13/2012  . Transaminitis 05/17/2011  . Other screening mammogram 05/17/2011  . B12 deficiency 01/31/2010  . ARTHRALGIA 01/31/2010  . Essential hypertension 08/16/2006  . ASTHMA, EXTRINSIC NOS 08/16/2006  . ESOPHAGITIS 08/16/2006  . GERD 08/16/2006    Past Surgical History  Procedure  Laterality Date  . Appendectomy      As a child  . Tonsillectomy      As a child  . Nsvd      X 2  . Tubal ligation  Late 20's  . Foot surgery      Pin in small toe  . Doppler echocardiography      Secondary to Redux, wnl  . Dobutamine stress echo  11/00    wnl  . Cat scan  11/00    wnl  . Esophagogastroduodenoscopy  8/01    Esophagitis [name])  . Sbft  08/01    wnl  . 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 Cholecystectomy  04/20/2012    Procedure: LAPAROSCOPIC CHOLECYSTECTOMY WITH INTRAOPERATIVE CHOLANGIOGRAM;  Surgeon: Robyne Askew, MD;  Location: [facility] OR;  Service: General;  Laterality: N/A;    Current Outpatient Rx  Name  Route  Sig  Dispense  Refill  . buPROPion (WELLBUTRIN SR) 150 MG 12 hr tablet   Oral   Take 150 mg by mouth 2 (two) times daily.         . cyanocobalamin (,VITAMIN B-12,) 1000 MCG/ML injection   Subcutaneous   Inject 1,000 mcg into the skin every 8 (eight) weeks.         . cyclobenzaprine (FLEXERIL) 10 MG tablet   Oral   Take 0.5-1 tablets (5-10 mg total) by mouth 3 (three) times daily as needed (for migraine).   30 tablet   0   . metoprolol (LOPRESSOR) 50 MG tablet   Oral   Take  1 tablet (50 mg total) by mouth 2 (two) times daily.   180 tablet   3   . pantoprazole (PROTONIX) 40 MG tablet   Oral   Take 1 tablet (40 mg total) by mouth daily.   90 tablet   3   . predniSONE (DELTASONE) 20 MG tablet   Oral   Take 3 tablets (60 mg total) by mouth daily with breakfast.   15 tablet   0   . promethazine (PHENERGAN) 25 MG tablet   Oral   Take 1 tablet (25 mg total) by mouth every 6 (six) hours as needed for nausea or vomiting.   30 tablet   2   . valACYclovir (VALTREX) 1000 MG tablet   Oral   Take 1 tablet (1,000 mg total) by mouth 3 (three) times daily.   21 tablet   0     Allergies Latex; Atorvastatin; Dilaudid; Lisinopril; Mirtazapine; and Nsaids  Family History  Problem Relation Age of Onset  . Heart  disease Mother     CAD, angioplasty twice with stents  . Cancer Mother     Breast  . Breast cancer Mother 31  . Hypertension Mother   . Hypertension Father   . Hypothyroidism Father   . Heart disease Father     MI at age 63  . Cancer Father     H/O renal CA  . Diabetes Sister   . Cancer Sister     Renal CA  . Colon cancer Neg Hx     Social History Social History  Substance Use Topics  . Smoking status: Never Smoker   . Smokeless tobacco: Never Used  . Alcohol Use: Yes     Comment: rare as of 2016, 10/02/15 3-4 glasses wine weekly    Review of Systems Constitutional: No fever/chills Eyes: No visual changes. ENT: No sore throat. Cardiovascular: Denies chest pain. Respiratory: Denies shortness of breath. Gastrointestinal:  abdominal pain.   nausea,vomiting.  No diarrhea.  No constipation. Genitourinary: Negative for dysuria. Musculoskeletal: See history of present illness Skin: Negative for rash. Neurological: Negative for headaches, focal weakness or numbness.  10-point ROS otherwise negative.  ____________________________________________   PHYSICAL EXAM:  VITAL SIGNS: ED Triage Vitals  Enc Vitals Group     BP 10/26/15 0644 156/87 mmHg     Pulse Rate 10/26/15 0644 58     Resp 10/26/15 0644 18     Temp 10/26/15 0644 98.4 F (36.9 C)     Temp Source 10/26/15 0644 Oral     SpO2 10/26/15 0644 97 %     Weight 10/26/15 0644 185 lb (83.915 kg)     Height 10/26/15 0644 5' (1.524 m)     Head Cir --      Peak Flow --      Pain Score 10/26/15 0644 9     Pain Loc --      Pain Edu? --      Excl. in GC? --     Constitutional: Alert and oriented. Well appearing and in no acute distress. Eyes: Conjunctivae are normal. PERRL. EOMI. Head: Atraumatic. Nose: No congestion/rhinnorhea. Mouth/Throat: Mucous membranes are moist.  Oropharynx non-erythematous. Neck: No stridor.   Cardiovascular: Normal rate, regular rhythm. Grossly normal heart sounds.  Good peripheral  circulation. Respiratory: Normal respiratory effort.  No retractions. Lungs CTAB. Gastrointestinal: Soft tender to palpation percussion from the umbilicus to the epigastric area in the midline and just to both sides that is not really tender on the  right or left side of the abdomen No distention. No abdominal bruits. No CVA tenderness. Musculoskeletal: No lower extremity tenderness nor edema.  No joint effusions. Neurologic:  Normal speech and language. No gross focal neurologic deficits are appreciated. No gait instability. Skin:  Skin is warm, dry and intact. No rash noted. Psychiatric: Mood and affect are normal. Speech and behavior are normal.  ____________________________________________   LABS (all labs ordered are listed, but only abnormal results are displayed)  Labs Reviewed  LIPASE, BLOOD - Abnormal; Notable for the following:    Lipase 279 (*)    All other components within normal limits  COMPREHENSIVE METABOLIC PANEL - Abnormal; Notable for the following:    Glucose, Bld 103 (*)    Calcium 8.6 (*)    All other components within normal limits  CBC  TROPONIN I   ____________________________________________  EKG EKG read and interpreted by me shows sinus bradycardia at 56 normal axis no acute ST-T wave changes ____________________________________________  RADIOLOGY  CT read by radiology as pancreatitis with significant peripancreatic inflammation ____________________________________________   PROCEDURES    Procedures   ____________________________________________   INITIAL IMPRESSION / ASSESSMENT AND PLAN / ED COURSE  Pertinent labs & imaging results that were available during my care of the patient were reviewed by me and considered in my medical decision making (see chart for details).  At 2:30 patient is still nauseated and having dry heaves. She has had Zofran at least twice. ____________________________________________   FINAL CLINICAL  IMPRESSION(S) / ED DIAGNOSES  Final diagnoses:  Acute pancreatitis, unspecified pancreatitis type      NEW MEDICATIONS STARTED DURING THIS VISIT:  New Prescriptions   No medications on file     Note:  This document was prepared using Dragon voice recognition software and may include unintentional dictation errors.    Arnaldo Natal, MD 10/26/15 [phone]

## 2015-10-26 NOTE — Consult Note (Signed)
GI Inpatient Consult Note  Reason for Consult:Alcohol pancreatitis   Attending Requesting Consult:Dr. Sparks  History of Present Illness: Abigail [name] is a 55 y.o. female with hx of pancreatitis with last attack about 6 months ago.  She admits to drinking 2 glasses of wine a day.  She had onset last night after drinking beer while [place].  She had typical pain in epigastric area and came to ER where CT was positive for acute pancreatitis and her lipase was elevated at 5 times normal and she was admitted and has been on iv fluids, iv morphine.  While in hospital There has been some trouble regulating her BP, she takes metoprolol.  Past Medical History:  Past Medical History  Diagnosis Date  . GERD (gastroesophageal reflux disease)   . Hypertension   . Photosensitivity     (No formal dx of lupus) prev eval Dr. Gavin Potters in [place]  . Pancreatitis     2013 alcohol and/or gallstone  . Cholecystitis   . Alcoholism [facility])     Problem List: Patient Active Problem List   Diagnosis Date Noted  . Vomiting without nausea, intractable 10/26/2015  . Acute pancreatitis 10/26/2015  . Other migraine without status migrainosus, not intractable 09/17/2015  . Breast lesion 04/18/2015  . Low grade fever 01/01/2015  . Abdominal pain, epigastric 01/01/2015  . Sciatica 10/10/2014  . At high risk for caregiver role strain 06/05/2014  . Routine general medical examination at a health care facility 06/05/2014  . Advance care planning 06/05/2014  . Pain in joint, shoulder region 07/19/2013  . History of alcohol abuse 09/13/2012  . Cholecystitis with cholelithiasis 07/01/2012  . Biliary colic 04/19/2012  . Anxiety 04/10/2012  . Pulmonary nodules 04/10/2012  . Gastritis 03/17/2012  . Pancreatitis, acute 03/13/2012  . Transaminitis 05/17/2011  . Other screening mammogram 05/17/2011  . B12 deficiency 01/31/2010  . ARTHRALGIA 01/31/2010  . Essential hypertension 08/16/2006  . ASTHMA,  EXTRINSIC NOS 08/16/2006  . ESOPHAGITIS 08/16/2006  . GERD 08/16/2006    Past Surgical History: Past Surgical History  Procedure Laterality Date  . Appendectomy      As a child  . Tonsillectomy      As a child  . Nsvd      X 2  . Tubal ligation  Late 20's  . Foot surgery      Pin in small toe  . Doppler echocardiography      Secondary to Redux, wnl  . Dobutamine stress echo  11/00    wnl  . Cat scan  11/00    wnl  . Esophagogastroduodenoscopy  8/01    Esophagitis [name])  . Sbft  08/01    wnl  . 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 Cholecystectomy  04/20/2012    Procedure: LAPAROSCOPIC CHOLECYSTECTOMY WITH INTRAOPERATIVE CHOLANGIOGRAM;  Surgeon: Robyne Askew, MD;  Location: [facility] OR;  Service: General;  Laterality: N/A;    Allergies: Allergies  Allergen Reactions  . Latex Rash    "hands turn beat red and start to itch"  . Atorvastatin Other (See Comments)    Swollen joints  . Dilaudid [Hydromorphone Hcl] Itching and Other (See Comments)    Itching- no rash- likely side effect but not true allergy.  Tolerates oxycodone.   . Lisinopril Other (See Comments)    Hives, presumed allergy  . Mirtazapine Swelling    swelling  . Nsaids     Gastritis 2013    Home Medications: Prescriptions  prior to admission  Medication Sig Dispense Refill Last Dose  . buPROPion (WELLBUTRIN SR) 150 MG 12 hr tablet Take 150 mg by mouth 2 (two) times daily.   10/25/2015 at Unknown time  . cyanocobalamin (,VITAMIN B-12,) 1000 MCG/ML injection Inject 1,000 mcg into the skin every 8 (eight) weeks.   08/26/2015  . cyclobenzaprine (FLEXERIL) 10 MG tablet Take 0.5-1 tablets (5-10 mg total) by mouth 3 (three) times daily as needed (for migraine). 30 tablet 0 never filled  . metoprolol (LOPRESSOR) 50 MG tablet Take 1 tablet (50 mg total) by mouth 2 (two) times daily. 180 tablet 3 10/25/2015 at 2030  . pantoprazole (PROTONIX) 40 MG tablet Take 1 tablet (40 mg total) by mouth daily. 90 tablet  3 10/25/2015 at Unknown time  . promethazine (PHENERGAN) 25 MG tablet Take 1 tablet (25 mg total) by mouth every 6 (six) hours as needed for nausea or vomiting. 30 tablet 2 10/25/2015 at Unknown time  . predniSONE (DELTASONE) 20 MG tablet Take 3 tablets (60 mg total) by mouth daily with breakfast. 15 tablet 0   . valACYclovir (VALTREX) 1000 MG tablet Take 1 tablet (1,000 mg total) by mouth 3 (three) times daily. 21 tablet 0    Home medication reconciliation was completed with the patient.   Scheduled Inpatient Medications:   . buPROPion  150 mg Oral BID  . cefTRIAXone (ROCEPHIN)  IV  1 g Intravenous Q24H  . docusate sodium  100 mg Oral BID  . famotidine (PEPCID) IV  20 mg Intravenous Q12H  . heparin  5,000 Units Subcutaneous Q8H  . metoprolol  50 mg Oral BID  . pantoprazole  40 mg Oral Daily    Continuous Inpatient Infusions:   . sodium chloride 150 mL/hr at 10/26/15 1657    PRN Inpatient Medications:  acetaminophen **OR** acetaminophen, bisacodyl, cyclobenzaprine, hydrALAZINE, LORazepam, morphine injection, ondansetron **OR** ondansetron (ZOFRAN) IV, promethazine  Family History: family history includes Breast cancer (age of onset: 68) in her mother; Cancer in her father, mother, and sister; Diabetes in her sister; Heart disease in her father and mother; Hypertension in her father and mother; Hypothyroidism in her father. There is no history of Colon cancer.  The patient's family history is negative for inflammatory bowel disorders, GI malignancy, or solid organ transplantation.  Social History:   reports that she has never smoked. She has never used smokeless tobacco. She reports that she drinks alcohol. She reports that she does not use illicit drugs. The patient denies ETOH, tobacco, or drug use.   Review of Systems: Constitutional: Weight is stable. Gaining some in last year, previous gastric bypass Eyes: No changes in vision. ENT: No oral lesions, sore throat.  GI: see HPI.   Heme/Lymph: No easy bruising.  CV: No chest pain.  GU: No hematuria. No recent UTI Integumentary: No rashes.  Neuro: No headaches.  Psych: No depression/anxiety.  Endocrine: No heat/cold intolerance.  Allergic/Immunologic: No urticaria.  Resp: No cough, SOB.  Musculoskeletal: No joint swelling.    Physical Examination: BP 182/98 mmHg  Pulse 68  Temp(Src) 98.7 F (37.1 C) (Oral)  Resp 20  Ht 5' (1.524 m)  Wt 83.915 kg (185 lb)  BMI 36.13 kg/m2  SpO2 98%  LMP  (LMP Unknown) Gen: NAD, alert and oriented x 4 HEENT: , EOMI, Neck: supple, no JVD or thyromegaly Chest: CTA bilaterally, no wheezes, crackles, or other adventitious sounds, questionable decreased air flow right base. CV: RRR, no m/g/c/r Abd: bowel sounds present, not distended, epigastric  tenderness, no palpable masses. Ext: no edema, well perfused with 2+ pulses, Skin: no rash or lesions noted Lymph: no LAD  Data: Lab Results  Component Value Date   WBC 9.8 10/26/2015   HGB 15.3 10/26/2015   HCT 43.6 10/26/2015   MCV 95.6 10/26/2015   PLT 274 10/26/2015    Recent Labs Lab 10/26/15 0722  HGB 15.3   Lab Results  Component Value Date   NA 137 10/26/2015   K 4.1 10/26/2015   CL 104 10/26/2015   CO2 22 10/26/2015   BUN 20 10/26/2015   CREATININE 0.53 10/26/2015   Lab Results  Component Value Date   ALT 23 10/26/2015   AST 30 10/26/2015   ALKPHOS 78 10/26/2015   BILITOT 1.0 10/26/2015   No results for input(s): APTT, INR, PTT in the last 168 hours. Assessment/Plan: Abigail [name] is a 55 y.o. female with hx of pancreatitis from alcohol with last spell about 6 months ago and was not admitted at that time.  Patient knows that the alcohol causes her problems but cannot or does not want to stop.  Treatment is supportive with morphine and zofran and iv fluids, keep NPO until pain better.Will follow with you.  No need for EGD at this time but she has had ulcerations in the past  Recommendations:  Thank you  for the consult. Please call with questions or concerns.  Lynnae Prude, MD

## 2015-10-26 NOTE — ED Notes (Signed)
Pt assisted to the bathroom by Gerilyn Pilgrim, EDT at this time.

## 2015-10-26 NOTE — H&P (Signed)
History and Physical    FLOE PRUETTE GGE:[id number] DOB: 1960-11-15 DOA: 10/26/2015  Referring physician: Dr. Darnelle Catalan PCP: Crawford Givens, MD  Specialists: none  Chief Complaint: abdominal pain with N/V  HPI: Abigail [name] is a 55 y.o. female has a past medical history significant for ETOH abuse and pancreatitis now with 1-2 day hx of recurrent epigastric abdominal pain with N/V. Last ETOH was yesterday. In ER, Lipase elevated. Pt continues to have pain with N/V despite IV meds given in ER. She is now admitted. CT shows pancreatitis.  Review of Systems: The patient denies  fever, weight loss,, vision loss, decreased hearing, hoarseness, chest pain, syncope, dyspnea on exertion, peripheral edema, balance deficits, hemoptysis, melena, hematochezia, severe indigestion/heartburn, hematuria, incontinence, genital sores, muscle weakness, suspicious skin lesions, transient blindness, difficulty walking, depression, unusual weight change, abnormal bleeding, enlarged lymph nodes, angioedema, and breast masses.   Past Medical History  Diagnosis Date  . GERD (gastroesophageal reflux disease)   . Hypertension   . Photosensitivity     (No formal dx of lupus) prev eval Dr. Gavin Potters in [place]  . Pancreatitis     2013 alcohol and/or gallstone  . Cholecystitis   . Alcoholism [facility])    Past Surgical History  Procedure Laterality Date  . Appendectomy      As a child  . Tonsillectomy      As a child  . Nsvd      X 2  . Tubal ligation  Late 20's  . Foot surgery      Pin in small toe  . Doppler echocardiography      Secondary to Redux, wnl  . Dobutamine stress echo  11/00    wnl  . Cat scan  11/00    wnl  . Esophagogastroduodenoscopy  8/01    Esophagitis [name])  . Sbft  08/01    wnl  . 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 Cholecystectomy  04/20/2012    Procedure: LAPAROSCOPIC CHOLECYSTECTOMY WITH INTRAOPERATIVE CHOLANGIOGRAM;  Surgeon: Robyne Askew, MD;  Location: [facility]  OR;  Service: General;  Laterality: N/A;   Social History:  reports that she has never smoked. She has never used smokeless tobacco. She reports that she drinks alcohol. She reports that she does not use illicit drugs.  Allergies  Allergen Reactions  . Latex Rash    "hands turn beat red and start to itch"  . Atorvastatin Other (See Comments)    Swollen joints  . Dilaudid [Hydromorphone Hcl] Other (See Comments)    Itching- no rash- likely side effect but not true allergy.  Tolerates oxycodone.   . Lisinopril Other (See Comments)    Hives, presumed allergy  . Mirtazapine Swelling    swelling  . Nsaids     Gastritis 2013    Family History  Problem Relation Age of Onset  . Heart disease Mother     CAD, angioplasty twice with stents  . Cancer Mother     Breast  . Breast cancer Mother 83  . Hypertension Mother   . Hypertension Father   . Hypothyroidism Father   . Heart disease Father     MI at age 98  . Cancer Father     H/O renal CA  . Diabetes Sister   . Cancer Sister     Renal CA  . Colon cancer Neg Hx     Prior to Admission medications   Medication Sig Start Date End Date Taking? Authorizing Provider  buPROPion (WELLBUTRIN SR) 150 MG 12 hr tablet Take 150 mg by mouth 2 (two) times daily.    Historical Provider, MD  cyanocobalamin (,VITAMIN B-12,) 1000 MCG/ML injection Inject 1,000 mcg into the skin every 8 (eight) weeks.    Historical Provider, MD  cyclobenzaprine (FLEXERIL) 10 MG tablet Take 0.5-1 tablets (5-10 mg total) by mouth 3 (three) times daily as needed (for migraine). 10/22/15   Joaquim Nam, MD  metoprolol (LOPRESSOR) 50 MG tablet Take 1 tablet (50 mg total) by mouth 2 (two) times daily. 08/27/15   Joaquim Nam, MD  pantoprazole (PROTONIX) 40 MG tablet Take 1 tablet (40 mg total) by mouth daily. 08/27/15   Joaquim Nam, MD  predniSONE (DELTASONE) 20 MG tablet Take 3 tablets (60 mg total) by mouth daily with breakfast. 10/22/15   Joaquim Nam, MD   promethazine (PHENERGAN) 25 MG tablet Take 1 tablet (25 mg total) by mouth every 6 (six) hours as needed for nausea or vomiting. 08/27/15   Joaquim Nam, MD  valACYclovir (VALTREX) 1000 MG tablet Take 1 tablet (1,000 mg total) by mouth 3 (three) times daily. 10/22/15   Joaquim Nam, MD   Physical Exam: Filed Vitals:   10/26/15 1230 10/26/15 1300 10/26/15 1330 10/26/15 1400  BP: 181/124 176/86 155/99 174/95  Pulse: 60 60 69 78  Temp:      TempSrc:      Resp: Height:      Weight:      SpO2: 100% 95% 96% 94%     General:  [place]/AT, WDWN, acutely ill appearing in moderate distress  Eyes: PERRL, EOMI, no scleral icterus, conjunctiva clear  ENT: moist oropharynx without exudate. TM's benign, dentition fair  Neck: supple, no lymphadenopathy. No bruits or thyromegaly  Cardiovascular: regular rate without MRG; 2+ peripheral pulses, no JVD, no peripheral edema  Respiratory: CTA biL, good air movement without wheezing, rhonchi or crackled. Respiratory effort normal  Abdomen: soft,  tender to palpation in the epigastrum with guarding, positive bowel sounds, no rebound  Skin: no rashes or lesions  Musculoskeletal: normal bulk and tone, no joint swelling  Psychiatric: normal mood and affect, A&OX3  Neurologic: CN 2-12 grossly intact, Motor strength 5/5 in all 4 groups with symmetric DTR's and non-focal sensory exam  Labs on Admission:  Basic Metabolic Panel:  Recent Labs Lab 10/26/15 0722  NA 137  K 4.1  CL 104  CO2 22  GLUCOSE 103*  BUN 20  CREATININE 0.53  CALCIUM 8.6*   Liver Function Tests:  Recent Labs Lab 10/26/15 0722  AST 30  ALT 23  ALKPHOS 78  BILITOT 1.0  PROT 7.2  ALBUMIN 3.7    Recent Labs Lab 10/26/15 0722  LIPASE 279*   No results for input(s): AMMONIA in the last 168 hours. CBC:  Recent Labs Lab 10/26/15 0722  WBC 9.8  HGB 15.3  HCT 43.6  MCV 95.6  PLT 274   Cardiac Enzymes:  Recent Labs Lab 10/26/15 0722   TROPONINI <0.03    BNP (last 3 results) No results for input(s): BNP in the last 8760 hours.  ProBNP (last 3 results) No results for input(s): PROBNP in the last 8760 hours.  CBG: No results for input(s): GLUCAP in the last 168 hours.  Radiological Exams on Admission: Dg Chest 2 View  10/26/2015  CLINICAL DATA:  Patient reports that she has chest pain, upper abd pain, nausea and vomiting that started last night. Hx/o  HTN. Nonsmoker. EXAM: CHEST  2 VIEW COMPARISON:  03/21/2015 acute abdomen series. FINDINGS: Midline trachea. Normal heart size and mediastinal contours. No pleural effusion or pneumothorax. Mildly low lung volumes with bibasilar volume loss. No lobar consolidation. Extensive upper abdominal surgical changes. IMPRESSION: No acute cardiopulmonary disease. Electronically Signed   By: Jeronimo Greaves M.D.   On: 10/26/2015 07:30   US Abdomen Complete  10/26/2015  CLINICAL DATA:  Abdominal pain and elevated lipase level. EXAM: ABDOMEN ULTRASOUND COMPLETE COMPARISON:  CT of 01/06/2015 FINDINGS: Gallbladder: Surgically absent. Common bile duct: Diameter: Normal, 3 mm. Liver: Mildly increased echogenicity.  No focal liver lesion. IVC: No abnormality visualized. Pancreas: Poorly visualized due to overlying bowel gas. Spleen: Size and appearance within normal limits. Right Kidney: Length: 10.8 cm. Echogenicity within normal limits. No mass or hydronephrosis visualized. Left Kidney: Length: 11.4 cm. Echogenicity within normal limits. No mass or hydronephrosis visualized. Abdominal aorta: No aneurysm visualized. Other findings: No ascites. IMPRESSION: 1. No acute abdominal process. Suboptimal visualization of the pancreas. 2. Hepatic steatosis. 3. Cholecystectomy. Electronically Signed   By: Jeronimo Greaves M.D.   On: 10/26/2015 10:41   Ct Abdomen Pelvis W Contrast  10/26/2015  CLINICAL DATA:  Nausea, vomiting, pancreatitis and elevated lipase. History of prior cholecystectomy. Poor visualization of  the pancreas by ultrasound earlier today. History of gastric bypass. EXAM: CT ABDOMEN AND PELVIS WITH CONTRAST TECHNIQUE: Multidetector CT imaging of the abdomen and pelvis was performed using the standard protocol following bolus administration of intravenous contrast. CONTRAST:  ISOVUE-300 IOPAMIDOL (ISOVUE-300) INJECTION 61% COMPARISON:  Abdominal ultrasound earlier today as well as prior CT of the abdomen and pelvis on 01/06/2015 FINDINGS: Lower chest: Bibasilar atelectasis, left greater than right. No pleural effusions identified. Hepatobiliary: Mild hepatic steatosis without evidence of cirrhosis. Stable tiny cyst in the anterior right lobe measures approximately 5 mm. No evidence of biliary ductal dilatation. The gallbladder has been removed. Pancreas: Significant inflammatory changes are seen surrounding the majority of the pancreas and is extending laterally and inferiorly within the retroperitoneal space. Findings are consistent with acute pancreatitis. No evidence of pancreatic necrosis or pseudocyst. No focal pancreatic abscess. Spleen: Within normal limits in size and appearance. Adrenals/Urinary Tract: The adrenal glands and kidneys are unremarkable. No renal calculi or hydronephrosis. The bladder is unremarkable. Stomach/Bowel: Prior Roux-en-Y gastric bypass with no postsurgical abnormalities identified and no evidence of leak or obstruction. Bowel shows no evidence of obstruction, significant ileus or perforation. No focal abscess identified. Vascular/Lymphatic: No enlarged lymph nodes are seen. The abdominal aorta is normal in caliber. Reproductive: No mass or other significant abnormality. Other: No hernias identified. Musculoskeletal:  Bony structures are unremarkable. IMPRESSION: 1. Diffuse pancreatitis with significant peripancreatic inflammatory changes. No evidence of pancreatic necrosis or focal abscess. 2. Mild hepatic steatosis. No evidence of biliary obstruction status post prior  cholecystectomy. Electronically Signed   By: Irish Lack M.D.   On: 10/26/2015 13:54    EKG: Independently reviewed.  Assessment/Plan Principal Problem:   Pancreatitis, acute Active Problems:   History of alcohol abuse   Abdominal pain, epigastric   Vomiting without nausea, intractable   Will admit to floor with IV fluids and empiric IV ABX. NPO. Consult GI. Watch for ETOH withdrawal. IV ativan prn ordered. Repeat labs in AM.  Diet: NPO Fluids: NS@150  DVT Prophylaxis: SQ Heparin  Code Status: FULL  Family Communication: none  Disposition Plan: home  Time spent: 50 min

## 2015-10-27 LAB — COMPREHENSIVE METABOLIC PANEL
ALT: 17 U/L (ref 14–54)
ALT: 16 U/L (ref 14–54)
ANION GAP: 7 (ref 5–15)
AST: 26 U/L (ref 15–41)
AST: 29 U/L (ref 15–41)
Albumin: 3.3 g/dL — ABNORMAL LOW (ref 3.5–5.0)
Albumin: 3.1 g/dL — ABNORMAL LOW (ref 3.5–5.0)
Alkaline Phosphatase: 73 U/L (ref 38–126)
Alkaline Phosphatase: 64 U/L (ref 38–126)
Anion gap: 9 (ref 5–15)
BUN: 10 mg/dL (ref 6–20)
BUN: 9 mg/dL (ref 6–20)
CALCIUM: 8.2 mg/dL — AB (ref 8.9–10.3)
CHLORIDE: 105 mmol/L (ref 101–111)
CHLORIDE: 103 mmol/L (ref 101–111)
CO2: 21 mmol/L — AB (ref 22–32)
CO2: 23 mmol/L (ref 22–32)
Calcium: 8.2 mg/dL — ABNORMAL LOW (ref 8.9–10.3)
Creatinine, Ser: 0.49 mg/dL (ref 0.44–1.00)
Creatinine, Ser: 0.61 mg/dL (ref 0.44–1.00)
GFR calc non Af Amer: >60 mL/min (ref >60)
Glucose, Bld: 95 mg/dL (ref 65–99)
Glucose, Bld: 130 mg/dL — ABNORMAL HIGH (ref 65–99)
POTASSIUM: 3.6 mmol/L (ref 3.5–5.1)
Potassium: 3.3 mmol/L — ABNORMAL LOW (ref 3.5–5.1)
SODIUM: 135 mmol/L (ref 135–145)
SODIUM: 133 mmol/L — AB (ref 135–145)
Total Bilirubin: 1.4 mg/dL — ABNORMAL HIGH (ref 0.3–1.2)
Total Bilirubin: 1.2 mg/dL (ref 0.3–1.2)
Total Protein: 6.6 g/dL (ref 6.5–8.1)
Total Protein: 6.3 g/dL — ABNORMAL LOW (ref 6.5–8.1)

## 2015-10-27 LAB — LIPASE, BLOOD
LIPASE: 106 U/L — AB (ref 11–51)
LIPASE: 84 U/L — AB (ref 11–51)

## 2015-10-27 LAB — CBC
HEMATOCRIT: 41.9 % (ref 35.0–47.0)
Hemoglobin: 14.7 g/dL (ref 12.0–16.0)
MCH: 33.9 pg (ref 26.0–34.0)
MCHC: 35.1 g/dL (ref 32.0–36.0)
MCV: 96.6 fL (ref 80.0–100.0)
Platelets: 235 10*3/uL (ref 150–440)
RBC: 4.34 MIL/uL (ref 3.80–5.20)
RDW: 13.8 % (ref 11.5–14.5)
WBC: 10.8 10*3/uL (ref 3.6–11.0)

## 2015-10-27 LAB — AMYLASE: AMYLASE: 84 U/L (ref 28–100)

## 2015-10-27 NOTE — Consult Note (Signed)
Patient feeling better with less pain, down to minimal degree of pain.  No nausea or vomiting.  Lipase down significantly.   Exam shows slight epigastric discomfort, chest exam shows minimal decreased breath sounds right base.  Probably can go home tomorrow.  Often patients who use alcohol have difficulty sleeping without it so she may need something for that.  She did not ask for this, it is my imput.

## 2015-10-27 NOTE — Progress Notes (Signed)
[facility] - [place] at [facility]   PATIENT NAME: Abigail [name]    MR#:  [id number]  DATE OF BIRTH:  1960/12/29  SUBJECTIVE:  CHIEF COMPLAINT:  Patient is resting comfortably. Pain is manageable. Wants to try clear liquid diet. Planning to follow with alcohol rehabilitation center as an outpatient to quit drinking  REVIEW OF SYSTEMS:  CONSTITUTIONAL: No fever, fatigue or weakness.  EYES: No blurred or double vision.  EARS, NOSE, AND THROAT: No tinnitus or ear pain.  RESPIRATORY: No cough, shortness of breath, wheezing or hemoptysis.  CARDIOVASCULAR: No chest pain, orthopnea, edema.  GASTROINTESTINAL: No nausea, vomiting, diarrhea . Minimal epigastric abdominal pain.  GENITOURINARY: No dysuria, hematuria.  ENDOCRINE: No polyuria, nocturia,  HEMATOLOGY: No anemia, easy bruising or bleeding SKIN: No rash or lesion. MUSCULOSKELETAL: No joint pain or arthritis.   NEUROLOGIC: No tingling, numbness, weakness.  PSYCHIATRY: No anxiety or depression.   DRUG ALLERGIES:   Allergies  Allergen Reactions  . Latex Rash    "hands turn beat red and start to itch"  . Atorvastatin Other (See Comments)    Swollen joints  . Dilaudid [Hydromorphone Hcl] Itching and Other (See Comments)    Itching- no rash- likely side effect but not true allergy.  Tolerates oxycodone.   . Lisinopril Other (See Comments)    Hives, presumed allergy  . Mirtazapine Swelling    swelling  . Nsaids     Gastritis 2013    VITALS:  Blood pressure (!) 157/88, pulse 77, temperature 98.8 F (37.1 C), temperature source Oral, resp. rate 19, height 5' (1.524 m), weight 83.9 kg (185 lb), SpO2 95 %.  PHYSICAL EXAMINATION:  GENERAL:  55 y.o.-year-old patient lying in the bed with no acute distress.  EYES: Pupils equal, round, reactive to light and accommodation. No scleral icterus. Extraocular muscles intact.  HEENT: Head atraumatic, normocephalic. Oropharynx and nasopharynx clear.  NECK:  Supple,  no jugular venous distention. No thyroid enlargement, no tenderness.  LUNGS: Normal breath sounds bilaterally, no wheezing, rales,rhonchi or crepitation. No use of accessory muscles of respiration.  CARDIOVASCULAR: S1, S2 normal. No murmurs, rubs, or gallops.  ABDOMEN: Soft, Minimal epigastric tenderness is present, nondistended. Bowel sounds present. No organomegaly or mass.  EXTREMITIES: No pedal edema, cyanosis, or clubbing.  NEUROLOGIC: Cranial nerves II through XII are intact. Muscle strength 5/5 in all extremities. Sensation intact. Gait not checked.  PSYCHIATRIC: The patient is alert and oriented x 3.  SKIN: No obvious rash, lesion, or ulcer.    LABORATORY PANEL:   CBC  Recent Labs Lab 10/27/15 0509  WBC 10.8  HGB 14.7  HCT 41.9  PLT 235   ------------------------------------------------------------------------------------------------------------------  Chemistries   Recent Labs Lab 10/27/15 0509  NA 135  K 3.6  CL 105  CO2 21*  GLUCOSE 95  BUN 10  CREATININE 0.49  CALCIUM 8.2*  AST 26  ALT 17  ALKPHOS 73  BILITOT 1.4*   ------------------------------------------------------------------------------------------------------------------  Cardiac Enzymes  Recent Labs Lab 10/26/15 0722  TROPONINI <0.03   ------------------------------------------------------------------------------------------------------------------  RADIOLOGY:  Dg Chest 2 View  Result Date: 10/26/2015 CLINICAL DATA:  Patient reports that she has chest pain, upper abd pain, nausea and vomiting that started last night. Hx/o HTN. Nonsmoker. EXAM: CHEST  2 VIEW COMPARISON:  03/21/2015 acute abdomen series. FINDINGS: Midline trachea. Normal heart size and mediastinal contours. No pleural effusion or pneumothorax. Mildly low lung volumes with bibasilar volume loss. No lobar consolidation. Extensive upper abdominal surgical changes. IMPRESSION: No acute cardiopulmonary  disease. Electronically  Signed   By: Jeronimo Greaves M.D.   On: 10/26/2015 07:30   US Abdomen Complete  Result Date: 10/26/2015 CLINICAL DATA:  Abdominal pain and elevated lipase level. EXAM: ABDOMEN ULTRASOUND COMPLETE COMPARISON:  CT of 01/06/2015 FINDINGS: Gallbladder: Surgically absent. Common bile duct: Diameter: Normal, 3 mm. Liver: Mildly increased echogenicity.  No focal liver lesion. IVC: No abnormality visualized. Pancreas: Poorly visualized due to overlying bowel gas. Spleen: Size and appearance within normal limits. Right Kidney: Length: 10.8 cm. Echogenicity within normal limits. No mass or hydronephrosis visualized. Left Kidney: Length: 11.4 cm. Echogenicity within normal limits. No mass or hydronephrosis visualized. Abdominal aorta: No aneurysm visualized. Other findings: No ascites. IMPRESSION: 1. No acute abdominal process. Suboptimal visualization of the pancreas. 2. Hepatic steatosis. 3. Cholecystectomy. Electronically Signed   By: Jeronimo Greaves M.D.   On: 10/26/2015 10:41   Ct Abdomen Pelvis W Contrast  Result Date: 10/26/2015 CLINICAL DATA:  Nausea, vomiting, pancreatitis and elevated lipase. History of prior cholecystectomy. Poor visualization of the pancreas by ultrasound earlier today. History of gastric bypass. EXAM: CT ABDOMEN AND PELVIS WITH CONTRAST TECHNIQUE: Multidetector CT imaging of the abdomen and pelvis was performed using the standard protocol following bolus administration of intravenous contrast. CONTRAST:  ISOVUE-300 IOPAMIDOL (ISOVUE-300) INJECTION 61% COMPARISON:  Abdominal ultrasound earlier today as well as prior CT of the abdomen and pelvis on 01/06/2015 FINDINGS: Lower chest: Bibasilar atelectasis, left greater than right. No pleural effusions identified. Hepatobiliary: Mild hepatic steatosis without evidence of cirrhosis. Stable tiny cyst in the anterior right lobe measures approximately 5 mm. No evidence of biliary ductal dilatation. The gallbladder has been removed. Pancreas:  Significant inflammatory changes are seen surrounding the majority of the pancreas and is extending laterally and inferiorly within the retroperitoneal space. Findings are consistent with acute pancreatitis. No evidence of pancreatic necrosis or pseudocyst. No focal pancreatic abscess. Spleen: Within normal limits in size and appearance. Adrenals/Urinary Tract: The adrenal glands and kidneys are unremarkable. No renal calculi or hydronephrosis. The bladder is unremarkable. Stomach/Bowel: Prior Roux-en-Y gastric bypass with no postsurgical abnormalities identified and no evidence of leak or obstruction. Bowel shows no evidence of obstruction, significant ileus or perforation. No focal abscess identified. Vascular/Lymphatic: No enlarged lymph nodes are seen. The abdominal aorta is normal in caliber. Reproductive: No mass or other significant abnormality. Other: No hernias identified. Musculoskeletal:  Bony structures are unremarkable. IMPRESSION: 1. Diffuse pancreatitis with significant peripancreatic inflammatory changes. No evidence of pancreatic necrosis or focal abscess. 2. Mild hepatic steatosis. No evidence of biliary obstruction status post prior cholecystectomy. Electronically Signed   By: Irish Lack M.D.   On: 10/26/2015 13:54    EKG:   Orders placed or performed during the hospital encounter of 10/26/15  . EKG 12-Lead  . EKG 12-Lead  . ED EKG within 10 minutes  . ED EKG within 10 minutes    ASSESSMENT AND PLAN:    #Acute pancreatitis-alcoholic, status post cholecystectomy in the past Pain is manageable. Continue current pain management Continue IV fluids Clear liquid diet and advance as tolerated Appreciate GI recommendations Outpatient follow-up with alcohol rehabilitation center is recommended  #Intractable nausea and vomiting Antiemetics, IV fluids Clear liquid diet as tolerated  #Alcohol abuse   counseled patient to quit drinking and outpatient follow-up with alcohol  rehabilitation center   #GERD PPI   #Hypertension stable. Continue current medications metoprolol and titrate as needed       All the  records are reviewed and case  discussed with Care Management/Social Workerr. Management plans discussed with the patient, family and they are in agreement.  CODE STATUS: fc   TOTAL TIME TAKING CARE OF THIS PATIENT: 36  minutes.   POSSIBLE D/C IN 2 DAYS, DEPENDING ON CLINICAL CONDITION.  Note: This dictation was prepared with Dragon dictation along with smaller phrase technology. Any transcriptional errors that result from this process are unintentional.   Ramonita Lab M.D on 10/27/2015 at 1:22 PM  Between 7am to 6pm - Pager - [phone] After 6pm go to [email or web address] - password [email or web address] [facility]  [facility]  Office  [phone]  CC: Primary care physician; Crawford Givens, MD

## 2015-10-28 LAB — LIPASE, BLOOD: LIPASE: 38 U/L (ref 11–51)

## 2015-10-28 MED ORDER — OXYCODONE HCL 5 MG PO TABS: 5.0000 mg | ORAL_TABLET | Freq: Four times a day (QID) | ORAL | 0 refills | Status: DC | PRN

## 2015-10-28 MED ORDER — DOCUSATE SODIUM 100 MG PO CAPS: 100.0000 mg | ORAL_CAPSULE | Freq: Two times a day (BID) | ORAL | 0 refills | Status: DC | PRN

## 2015-10-28 MED ORDER — OXYCODONE HCL 5 MG PO TABS: 5.0000 mg | ORAL_TABLET | Freq: Four times a day (QID) | ORAL | Status: DC | PRN

## 2015-10-28 MED ORDER — ACETAMINOPHEN 325 MG PO TABS: 650.0000 mg | ORAL_TABLET | Freq: Four times a day (QID) | ORAL | Status: DC | PRN

## 2015-10-28 NOTE — Discharge Summary (Signed)
[facility] [place] at [facility]   PATIENT NAME: Abigail [name]    MR#:  [id number]  DATE OF BIRTH:  04/19/1960  DATE OF ADMISSION:  10/26/2015 ADMITTING PHYSICIAN: Marguarite Arbour, MD  DATE OF DISCHARGE: 10/28/2015 PRIMARY CARE PHYSICIAN: Crawford Givens, MD    ADMISSION DIAGNOSIS:  Acute pancreatitis, unspecified pancreatitis type [K85.9]  DISCHARGE DIAGNOSIS:  Principal Problem:   Pancreatitis, acute Active Problems:   History of alcohol abuse   Abdominal pain, epigastric   Vomiting without nausea, intractable   Acute pancreatitis   SECONDARY DIAGNOSIS:   Past Medical History:  Diagnosis Date  . Alcoholism (HCC)   . Cholecystitis   . GERD (gastroesophageal reflux disease)   . Hypertension   . Pancreatitis    2013 alcohol and/or gallstone  . Photosensitivity    (No formal dx of lupus) prev eval Dr. Gavin Potters in [facility] COURSE:   #Acute pancreatitis-alcoholic, status post cholecystectomy in the past Pain is manageable with current regimen. Will discharge with the oxycodone to take as needed Given IV fluids Clear liquid diet and advanced as tolerated. Patient tolerated soft diet for lunch Appreciate GI recommendations. Okay to discharge from the standpoint Outpatient follow-up with alcohol rehabilitation center is recommended  #Intractable nausea and vomiting Antiemetics, IV fluids provided and clinically improved   #Alcohol abuse   counseled patient to quit drinking and outpatient follow-up with alcohol rehabilitation center   #GERD PPI   #Hypertension stable. Continue current medications metoprolol and titrate as needed   DISCHARGE CONDITIONS:   Fair  CONSULTS OBTAINED:  Treatment Team:  Scot Jun, MD   PROCEDURES None  DRUG ALLERGIES:   Allergies  Allergen Reactions  . Latex Rash    "hands turn beat red and start to itch"  . Atorvastatin Other (See Comments)    Swollen joints  .  Dilaudid [Hydromorphone Hcl] Itching and Other (See Comments)    Itching- no rash- likely side effect but not true allergy.  Tolerates oxycodone.   . Lisinopril Other (See Comments)    Hives, presumed allergy  . Mirtazapine Swelling    swelling  . Nsaids     Gastritis 2013    DISCHARGE MEDICATIONS:   Current Discharge Medication List    START taking these medications   Details  acetaminophen (TYLENOL) 325 MG tablet Take 2 tablets (650 mg total) by mouth every 6 (six) hours as needed for mild pain (or Fever >/= 101).    docusate sodium (COLACE) 100 MG capsule Take 1 capsule (100 mg total) by mouth 2 (two) times daily as needed for mild constipation. Qty: 10 capsule, Refills: 0    oxyCODONE (ROXICODONE) 5 MG immediate release tablet Take 1 tablet (5 mg total) by mouth every 6 (six) hours as needed for severe pain. Qty: 15 tablet, Refills: 0      CONTINUE these medications which have NOT CHANGED   Details  buPROPion (WELLBUTRIN SR) 150 MG 12 hr tablet Take 150 mg by mouth 2 (two) times daily.    cyanocobalamin (,VITAMIN B-12,) 1000 MCG/ML injection Inject 1,000 mcg into the skin every 8 (eight) weeks.    cyclobenzaprine (FLEXERIL) 10 MG tablet Take 0.5-1 tablets (5-10 mg total) by mouth 3 (three) times daily as needed (for migraine). Qty: 30 tablet, Refills: 0    metoprolol (LOPRESSOR) 50 MG tablet Take 1 tablet (50 mg total) by mouth 2 (two) times daily. Qty: 180 tablet, Refills: 3    pantoprazole (PROTONIX) 40  MG tablet Take 1 tablet (40 mg total) by mouth daily. Qty: 90 tablet, Refills: 3    promethazine (PHENERGAN) 25 MG tablet Take 1 tablet (25 mg total) by mouth every 6 (six) hours as needed for nausea or vomiting. Qty: 30 tablet, Refills: 2    predniSONE (DELTASONE) 20 MG tablet Take 3 tablets (60 mg total) by mouth daily with breakfast. Qty: 15 tablet, Refills: 0    valACYclovir (VALTREX) 1000 MG tablet Take 1 tablet (1,000 mg total) by mouth 3 (three) times  daily. Qty: 21 tablet, Refills: 0         DISCHARGE INSTRUCTIONS:   Activity as tolerated Low-salt diet Follow-up with primary care physician in a week Follow-up with outpatient alcohol rehabilitation medicine centers as soon as possible  DIET:  Low-salt  DISCHARGE CONDITION:  Fair  ACTIVITY:  Activity as tolerated  OXYGEN:  Home Oxygen: No.   Oxygen Delivery: room air  DISCHARGE LOCATION:  home   If you experience worsening of your admission symptoms, develop shortness of breath, life threatening emergency, suicidal or homicidal thoughts you must seek medical attention immediately by calling 911 or calling your MD immediately  if symptoms less severe.  You Must read complete instructions/literature along with all the possible adverse reactions/side effects for all the Medicines you take and that have been prescribed to you. Take any new Medicines after you have completely understood and accpet all the possible adverse reactions/side effects.   Please note  You were cared for by a hospitalist during your hospital stay. If you have any questions about your discharge medications or the care you received while you were in the hospital after you are discharged, you can call the unit and asked to speak with the hospitalist on call if the hospitalist that took care of you is not available. Once you are discharged, your primary care physician will handle any further medical issues. Please note that NO REFILLS for any discharge medications will be authorized once you are discharged, as it is imperative that you return to your primary care physician (or establish a relationship with a primary care physician if you do not have one) for your aftercare needs so that they can reassess your need for medications and monitor your lab values.     Today  Chief Complaint  Patient presents with  . Emesis  . Abdominal Pain   Patient is resting comfortably today. Abdominal pain is much  better. Tolerating soft diet. Wants to go home  ROS:  CONSTITUTIONAL: Denies fevers, chills. Denies any fatigue, weakness.  EYES: Denies blurry vision, double vision, eye pain. EARS, NOSE, THROAT: Denies tinnitus, ear pain, hearing loss. RESPIRATORY: Denies cough, wheeze, shortness of breath.  CARDIOVASCULAR: Denies chest pain, palpitations, edema.  GASTROINTESTINAL: Denies nausea, vomiting, diarrhea, abdominal pain. Denies bright red blood per rectum. GENITOURINARY: Denies dysuria, hematuria. ENDOCRINE: Denies nocturia or thyroid problems. HEMATOLOGIC AND LYMPHATIC: Denies easy bruising or bleeding. SKIN: Denies rash or lesion. MUSCULOSKELETAL: Denies pain in neck, back, shoulder, knees, hips or arthritic symptoms.  NEUROLOGIC: Denies paralysis, paresthesias.  PSYCHIATRIC: Denies anxiety or depressive symptoms.   VITAL SIGNS:  Blood pressure (!) 162/86, pulse 81, temperature 98.5 F (36.9 C), temperature source Oral, resp. rate 18, height 5' (1.524 m), weight 90.9 kg (200 lb 4.8 oz), SpO2 97 %.  I/O:    Intake/Output Summary (Last 24 hours) at 10/28/15 1303 Last data filed at 10/28/15 0900  Gross per 24 hour  Intake  5034.6 ml  Output                0 ml  Net           5034.6 ml    PHYSICAL EXAMINATION:  GENERAL:  55 y.o.-year-old patient lying in the bed with no acute distress.  EYES: Pupils equal, round, reactive to light and accommodation. No scleral icterus. Extraocular muscles intact.  HEENT: Head atraumatic, normocephalic. Oropharynx and nasopharynx clear.  NECK:  Supple, no jugular venous distention. No thyroid enlargement, no tenderness.  LUNGS: Normal breath sounds bilaterally, no wheezing, rales,rhonchi or crepitation. No use of accessory muscles of respiration.  CARDIOVASCULAR: S1, S2 normal. No murmurs, rubs, or gallops.  ABDOMEN: Soft, non-tender, non-distended. Bowel sounds present. No organomegaly or mass.  EXTREMITIES: No pedal edema, cyanosis, or  clubbing.  NEUROLOGIC: Cranial nerves II through XII are intact. Muscle strength 5/5 in all extremities. Sensation intact. Gait not checked.  PSYCHIATRIC: The patient is alert and oriented x 3.  SKIN: No obvious rash, lesion, or ulcer.   DATA REVIEW:   CBC  Recent Labs Lab 10/27/15 0509  WBC 10.8  HGB 14.7  HCT 41.9  PLT 235    Chemistries   Recent Labs Lab 10/27/15 1420  NA 133*  K 3.3*  CL 103  CO2 23  GLUCOSE 130*  BUN 9  CREATININE 0.61  CALCIUM 8.2*  AST 29  ALT 16  ALKPHOS 64  BILITOT 1.2    Cardiac Enzymes  Recent Labs Lab 10/26/15 0722  TROPONINI <0.03    Microbiology Results  Results for orders placed or performed during the hospital encounter of 01/06/15  Urine culture     Status: None   Collection Time: 01/06/15  9:56 AM  Result Value Ref Range Status   Specimen Description URINE, RANDOM  Final   Special Requests NONE  Final   Culture >=100,000 COLONIES/mL ESCHERICHIA COLI  Final   Report Status 01/08/2015 FINAL  Final   Organism ID, Bacteria ESCHERICHIA COLI  Final      Susceptibility   Escherichia coli - MIC*    AMPICILLIN >=32 RESISTANT Resistant     CEFTAZIDIME <=1 SENSITIVE Sensitive     CEFAZOLIN <=4 SENSITIVE Sensitive     CEFTRIAXONE <=1 SENSITIVE Sensitive     CIPROFLOXACIN <=0.25 SENSITIVE Sensitive     GENTAMICIN <=1 SENSITIVE Sensitive     IMIPENEM <=0.25 SENSITIVE Sensitive     TRIMETH/SULFA <=20 SENSITIVE Sensitive     PIP/TAZO Value in next row Sensitive      SENSITIVE<=4    * >=100,000 COLONIES/mL ESCHERICHIA COLI    RADIOLOGY:  Dg Chest 2 View  Result Date: 10/26/2015 CLINICAL DATA:  Patient reports that she has chest pain, upper abd pain, nausea and vomiting that started last night. Hx/o HTN. Nonsmoker. EXAM: CHEST  2 VIEW COMPARISON:  03/21/2015 acute abdomen series. FINDINGS: Midline trachea. Normal heart size and mediastinal contours. No pleural effusion or pneumothorax. Mildly low lung volumes with bibasilar  volume loss. No lobar consolidation. Extensive upper abdominal surgical changes. IMPRESSION: No acute cardiopulmonary disease. Electronically Signed   By: Jeronimo Greaves M.D.   On: 10/26/2015 07:30   US Abdomen Complete  Result Date: 10/26/2015 CLINICAL DATA:  Abdominal pain and elevated lipase level. EXAM: ABDOMEN ULTRASOUND COMPLETE COMPARISON:  CT of 01/06/2015 FINDINGS: Gallbladder: Surgically absent. Common bile duct: Diameter: Normal, 3 mm. Liver: Mildly increased echogenicity.  No focal liver lesion. IVC: No abnormality visualized. Pancreas: Poorly visualized due to overlying  bowel gas. Spleen: Size and appearance within normal limits. Right Kidney: Length: 10.8 cm. Echogenicity within normal limits. No mass or hydronephrosis visualized. Left Kidney: Length: 11.4 cm. Echogenicity within normal limits. No mass or hydronephrosis visualized. Abdominal aorta: No aneurysm visualized. Other findings: No ascites. IMPRESSION: 1. No acute abdominal process. Suboptimal visualization of the pancreas. 2. Hepatic steatosis. 3. Cholecystectomy. Electronically Signed   By: Jeronimo Greaves M.D.   On: 10/26/2015 10:41   Ct Abdomen Pelvis W Contrast  Result Date: 10/26/2015 CLINICAL DATA:  Nausea, vomiting, pancreatitis and elevated lipase. History of prior cholecystectomy. Poor visualization of the pancreas by ultrasound earlier today. History of gastric bypass. EXAM: CT ABDOMEN AND PELVIS WITH CONTRAST TECHNIQUE: Multidetector CT imaging of the abdomen and pelvis was performed using the standard protocol following bolus administration of intravenous contrast. CONTRAST:  ISOVUE-300 IOPAMIDOL (ISOVUE-300) INJECTION 61% COMPARISON:  Abdominal ultrasound earlier today as well as prior CT of the abdomen and pelvis on 01/06/2015 FINDINGS: Lower chest: Bibasilar atelectasis, left greater than right. No pleural effusions identified. Hepatobiliary: Mild hepatic steatosis without evidence of cirrhosis. Stable tiny cyst in the  anterior right lobe measures approximately 5 mm. No evidence of biliary ductal dilatation. The gallbladder has been removed. Pancreas: Significant inflammatory changes are seen surrounding the majority of the pancreas and is extending laterally and inferiorly within the retroperitoneal space. Findings are consistent with acute pancreatitis. No evidence of pancreatic necrosis or pseudocyst. No focal pancreatic abscess. Spleen: Within normal limits in size and appearance. Adrenals/Urinary Tract: The adrenal glands and kidneys are unremarkable. No renal calculi or hydronephrosis. The bladder is unremarkable. Stomach/Bowel: Prior Roux-en-Y gastric bypass with no postsurgical abnormalities identified and no evidence of leak or obstruction. Bowel shows no evidence of obstruction, significant ileus or perforation. No focal abscess identified. Vascular/Lymphatic: No enlarged lymph nodes are seen. The abdominal aorta is normal in caliber. Reproductive: No mass or other significant abnormality. Other: No hernias identified. Musculoskeletal:  Bony structures are unremarkable. IMPRESSION: 1. Diffuse pancreatitis with significant peripancreatic inflammatory changes. No evidence of pancreatic necrosis or focal abscess. 2. Mild hepatic steatosis. No evidence of biliary obstruction status post prior cholecystectomy. Electronically Signed   By: Irish Lack M.D.   On: 10/26/2015 13:54    EKG:   Orders placed or performed during the hospital encounter of 10/26/15  . EKG 12-Lead  . EKG 12-Lead  . ED EKG within 10 minutes  . ED EKG within 10 minutes      Management plans discussed with the patient, family and they are in agreement.  CODE STATUS:     Code Status Orders        Start     Ordered   10/26/15 1617  Full code  Continuous     10/26/15 1616    Code Status History    Date Active Date Inactive Code Status Order ID Comments User Context   04/19/2012  5:24 PM 04/21/2012  4:43 PM Full Code [id number]   Cindy Hazy, RN Inpatient      TOTAL TIME TAKING CARE OF THIS PATIENT: 45  minutes.   Note: This dictation was prepared with Dragon dictation along with smaller phrase technology. Any transcriptional errors that result from this process are unintentional.   @MEC @  on 10/28/2015 at 1:03 PM  Between 7am to 6pm - Pager - [phone]  After 6pm go to [email or web address] - password [email or web address] [facility]  [facility]  Office  [phone]  CC: Primary care physician; Crawford Givens, MD

## 2015-10-28 NOTE — Care Management Note (Signed)
Case Management Note  Patient Details  Name: Abigail [name] MRN: [id number] Date of Birth: 07-03-1960  Subjective/Objective:   Spoke with patient for discharge plan. She is alert and oriented from home with her boyfriend. States drives self see her PCP dr Para March regularly.  No CM needs identified. Patient expressed to me that she is planning on attending outpatient 12 step program after discharge.                  Action/Plan: Home with Self Care.   Expected Discharge Date:  10/29/15               Expected Discharge Plan:  Home/Self Care  In-House Referral:     Discharge planning Services  CM Consult  Post Acute Care Choice:    Choice offered to:     DME Arranged:    DME Agency:     HH Arranged:    HH Agency:     Status of Service:  Completed, signed off  If discussed at [employer] of Stay Meetings, dates discussed:    Additional Comments:  Adonis Huguenin, RN 10/28/2015, 11:18 AM

## 2015-10-28 NOTE — Discharge Instructions (Signed)
Activity as tolerated Low-salt diet Follow-up with primary care physician in a week Follow-up with outpatient alcohol rehabilitation medicine centers as soon as possible

## 2015-10-28 NOTE — Progress Notes (Signed)
Pt discharged home  Ambulatory . Voiced understanding of discharge instructions

## 2015-10-29 ENCOUNTER — Ambulatory Visit (INDEPENDENT_AMBULATORY_CARE_PROVIDER_SITE_OTHER): Payer: BLUE CROSS/BLUE SHIELD | Admitting: Sports Medicine

## 2015-10-29 ENCOUNTER — Ambulatory Visit (INDEPENDENT_AMBULATORY_CARE_PROVIDER_SITE_OTHER): Payer: BLUE CROSS/BLUE SHIELD | Admitting: *Deleted

## 2015-10-29 ENCOUNTER — Encounter: Payer: Self-pay | Admitting: Sports Medicine

## 2015-10-29 ENCOUNTER — Ambulatory Visit: Payer: Self-pay

## 2015-10-29 DIAGNOSIS — E538 Deficiency of other specified B group vitamins: Secondary | ICD-10-CM | POA: Diagnosis not present

## 2015-10-29 DIAGNOSIS — M79672 Pain in left foot: Secondary | ICD-10-CM

## 2015-10-29 DIAGNOSIS — M21619 Bunion of unspecified foot: Secondary | ICD-10-CM | POA: Diagnosis not present

## 2015-10-29 DIAGNOSIS — Z9889 Other specified postprocedural states: Secondary | ICD-10-CM | POA: Diagnosis not present

## 2015-10-29 MED ORDER — CYANOCOBALAMIN 1000 MCG/ML IJ SOLN
1000.0000 ug | Freq: Once | INTRAMUSCULAR | Status: AC
  Administered 2015-10-29: 1000 ug via INTRAMUSCULAR

## 2015-10-29 NOTE — Progress Notes (Signed)
Patient ID: ALAZAY LEICHT, female   DOB: 06-Feb-1961, 55 y.o.   MRN: [id number]  Subjective: Abigail [name] is a 55 y.o. female patient seen today in office for POV #6 (DOS 05-06-15), S/P Left [place] Bunionectomy. Patient denies current pain at surgical site, denies calf pain, denies headache, chest pain, shortness of breath, nausea, vomiting, fever, or chills. Patient states that she is doing well. No other issues noted.   Patient Active Problem List   Diagnosis Date Noted  . Vomiting without nausea, intractable 10/26/2015  . Acute pancreatitis 10/26/2015  . Other migraine without status migrainosus, not intractable 09/17/2015  . Breast lesion 04/18/2015  . Low grade fever 01/01/2015  . Abdominal pain, epigastric 01/01/2015  . Sciatica 10/10/2014  . At high risk for caregiver role strain 06/05/2014  . Routine general medical examination at a health care facility 06/05/2014  . Advance care planning 06/05/2014  . Pain in joint, shoulder region 07/19/2013  . History of alcohol abuse 09/13/2012  . Cholecystitis with cholelithiasis 07/01/2012  . Biliary colic 04/19/2012  . Anxiety 04/10/2012  . Pulmonary nodules 04/10/2012  . Gastritis 03/17/2012  . Pancreatitis, acute 03/13/2012  . Transaminitis 05/17/2011  . Other screening mammogram 05/17/2011  . B12 deficiency 01/31/2010  . ARTHRALGIA 01/31/2010  . Essential hypertension 08/16/2006  . ASTHMA, EXTRINSIC NOS 08/16/2006  . ESOPHAGITIS 08/16/2006  . GERD 08/16/2006   Current Outpatient Prescriptions on File Prior to Visit  Medication Sig Dispense Refill  . acetaminophen (TYLENOL) 325 MG tablet Take 2 tablets (650 mg total) by mouth every 6 (six) hours as needed for mild pain (or Fever >/= 101).    Marland Kitchen buPROPion (WELLBUTRIN SR) 150 MG 12 hr tablet Take 150 mg by mouth 2 (two) times daily.    . cyanocobalamin (,VITAMIN B-12,) 1000 MCG/ML injection Inject 1,000 mcg into the skin every 8 (eight) weeks.    . cyclobenzaprine (FLEXERIL) 10  MG tablet Take 0.5-1 tablets (5-10 mg total) by mouth 3 (three) times daily as needed (for migraine). 30 tablet 0  . docusate sodium (COLACE) 100 MG capsule Take 1 capsule (100 mg total) by mouth 2 (two) times daily as needed for mild constipation. 10 capsule 0  . metoprolol (LOPRESSOR) 50 MG tablet Take 1 tablet (50 mg total) by mouth 2 (two) times daily. 180 tablet 3  . oxyCODONE (ROXICODONE) 5 MG immediate release tablet Take 1 tablet (5 mg total) by mouth every 6 (six) hours as needed for severe pain. 15 tablet 0  . pantoprazole (PROTONIX) 40 MG tablet Take 1 tablet (40 mg total) by mouth daily. 90 tablet 3  . predniSONE (DELTASONE) 20 MG tablet Take 3 tablets (60 mg total) by mouth daily with breakfast. 15 tablet 0  . promethazine (PHENERGAN) 25 MG tablet Take 1 tablet (25 mg total) by mouth every 6 (six) hours as needed for nausea or vomiting. 30 tablet 2  . valACYclovir (VALTREX) 1000 MG tablet Take 1 tablet (1,000 mg total) by mouth 3 (three) times daily. 21 tablet 0   No current facility-administered medications on file prior to visit.    Allergies  Allergen Reactions  . Latex Rash    "hands turn beat red and start to itch"  . Atorvastatin Other (See Comments)    Swollen joints  . Dilaudid [Hydromorphone Hcl] Itching and Other (See Comments)    Itching- no rash- likely side effect but not true allergy.  Tolerates oxycodone.   . Lisinopril Other (See Comments)    Hives,  presumed allergy  . Mirtazapine Swelling    swelling  . Nsaids     Gastritis 2013    Objective: General: No acute distress, AAOx3  Left foot: Surgical site healed,  No swelling to forefoot, no erythema, no warmth, no drainage, no signs of infection noted, Capillary fill time <3 seconds in all digits, gross sensation present via light touch to left foot. No pain or crepitation with range of motion left foot. No pain with calf compression.   Xray, left foot: hardware intact, osteotomy healed, no acute issues.    Assessment and Plan:  Problem List Items Addressed This Visit    None    Visit Diagnoses    Left foot pain    -  Primary   Relevant Orders   DG Foot Complete Left   Status post left foot surgery       Relevant Orders   DG Foot Complete Left   Bunion       Relevant Orders   DG Foot Complete Left      -Patient seen and evaluated -Cont with activities as tolerated with no limitations -Cont with good supportive shoes daily  -Cont with ROM exercises daily as instructed at left 1st MTPJ -Patient to return as needed. In the meantime, patient to call office if any issues or problems arise.   [name], DPM

## 2015-11-04 ENCOUNTER — Ambulatory Visit (INDEPENDENT_AMBULATORY_CARE_PROVIDER_SITE_OTHER): Payer: BLUE CROSS/BLUE SHIELD | Admitting: Family Medicine

## 2015-11-04 ENCOUNTER — Encounter: Payer: Self-pay | Admitting: Family Medicine

## 2015-11-04 ENCOUNTER — Telehealth: Payer: Self-pay | Admitting: Family Medicine

## 2015-11-04 DIAGNOSIS — F1010 Alcohol abuse, uncomplicated: Secondary | ICD-10-CM

## 2015-11-04 DIAGNOSIS — R454 Irritability and anger: Secondary | ICD-10-CM

## 2015-11-04 DIAGNOSIS — K852 Alcohol induced acute pancreatitis without necrosis or infection: Secondary | ICD-10-CM | POA: Diagnosis not present

## 2015-11-04 DIAGNOSIS — F1011 Alcohol abuse, in remission: Secondary | ICD-10-CM

## 2015-11-04 MED ORDER — ESCITALOPRAM OXALATE 10 MG PO TABS: 10.0000 mg | ORAL_TABLET | Freq: Every day | ORAL | 1 refills | Status: DC

## 2015-11-04 NOTE — Progress Notes (Signed)
Inpatient f/u.    Acute pancreatitis-alcoholic, status post cholecystectomy in the past.   Pain is manageable with current regimen. Will discharge with the oxycodone to take as needed Given IV fluids Clear liquid diet and advanced as tolerated. Patient tolerated soft diet for lunch Appreciate GI recommendations. Okay to discharge from the standpoint Outpatient follow-up with alcohol rehabilitation center is recommended  #Intractable nausea and vomiting Antiemetics, IV fluids provided and clinically improved   #Alcohol abuse  counseled patient to quit drinking and outpatient follow-up with alcohol rehabilitation center   #GERD PPI   #Hypertension stable. Continue current medications metoprolol and titrate as needed -------------------------- The above d/w pt.  Her abd pain is resolved in the meantime. Able to eat in the meantime, back on solids.  D/w pt that there isn't a safe amount of etoh for her to drink.  "I know what the problem is," meaning she gets irritable and then starts drinking.  No SI/HI.  She has gone to rehab twice, has gone to counseling and outpatient rehab.  If she wasn't as angry, then she likely would be drinking.  Prev on SSRIs but in years.  D/w pt.   PMH and SH reviewed  ROS: Per HPI unless specifically indicated in ROS section   Meds, vitals, and allergies reviewed.   GEN: nad, alert and oriented HEENT: mucous membranes moist NECK: supple w/o LA CV: rrr.  PULM: ctab, no inc wob ABD: soft, +bs EXT: no edema SKIN: no acute rash

## 2015-11-04 NOTE — Progress Notes (Signed)
Pre visit review using our clinic review tool, if applicable. No additional management support is needed unless otherwise documented below in the visit note. 

## 2015-11-04 NOTE — Patient Instructions (Signed)
If you have more/worse headaches in the meantime, then update [place].  Use flexeril in the meantime.  Let me consider options for meds and psychiatry in the meantime.  Take care.  Glad to see you.

## 2015-11-04 NOTE — Telephone Encounter (Signed)
Notify pt.  Did some checking. Reasonable to retry SSRI, start lexapro in the meantime. Sent.   Likely will take several weeks to have effect.  If worsening mood in the meantime, then update me.   Reasonable to go ahead with psych appointment.  Have her check her insurance to see who is in her network and update me.  We can make plans when I see the options.  Thanks.

## 2015-11-04 NOTE — Assessment & Plan Note (Signed)
She needs to be able to limit her anger/reaction and subsequent drinking.  Will notify pt about reasonable to try SSRI again, rx sent.  See following phone note.

## 2015-11-04 NOTE — Assessment & Plan Note (Signed)
See discussion re: irritability.

## 2015-11-04 NOTE — Assessment & Plan Note (Signed)
Clinically resolved.  D/w pt about etoh avoidance at all costs.

## 2015-11-05 NOTE — Telephone Encounter (Signed)
Patient advised.

## 2015-11-08 ENCOUNTER — Other Ambulatory Visit: Payer: Self-pay | Admitting: Family Medicine

## 2015-11-08 NOTE — Telephone Encounter (Signed)
Electronic refill request. Last Filled:     30 tablet 0 10/22/2015   Please advise.

## 2015-11-09 ENCOUNTER — Other Ambulatory Visit: Payer: Self-pay | Admitting: Family Medicine

## 2015-11-10 MED ORDER — CYCLOBENZAPRINE HCL 10 MG PO TABS: 5.0000 mg | ORAL_TABLET | Freq: Three times a day (TID) | ORAL | 0 refills | Status: DC | PRN

## 2015-11-10 NOTE — Telephone Encounter (Signed)
Sent. Thanks.   

## 2015-11-13 ENCOUNTER — Ambulatory Visit (INDEPENDENT_AMBULATORY_CARE_PROVIDER_SITE_OTHER): Payer: BLUE CROSS/BLUE SHIELD | Admitting: Diagnostic Neuroimaging

## 2015-11-13 ENCOUNTER — Encounter: Payer: Self-pay | Admitting: Diagnostic Neuroimaging

## 2015-11-13 VITALS — BP 112/81 | HR 55 | Wt 192.0 lb

## 2015-11-13 DIAGNOSIS — B0221 Postherpetic geniculate ganglionitis: Secondary | ICD-10-CM

## 2015-11-13 DIAGNOSIS — B028 Zoster with other complications: Secondary | ICD-10-CM | POA: Diagnosis not present

## 2015-11-13 MED ORDER — GABAPENTIN 300 MG PO CAPS: 300.0000 mg | ORAL_CAPSULE | Freq: Two times a day (BID) | ORAL | 6 refills | Status: DC

## 2015-11-13 NOTE — Progress Notes (Signed)
[facility]  PATIENT: Abigail [name] DOB: [birth year]  REFERRING CLINICIAN: S Copeland HISTORY FROM: patient  REASON FOR VISIT: follow up   HISTORICAL  CHIEF COMPLAINT:  Chief Complaint  Patient presents with  . Other    rm 7, herpes zoster oticus, "still have dull HA but not the shooting pains I was having"    HISTORY OF PRESENT ILLNESS:   UPDATE 11/13/15: Since last visit, doing better. Post-herpetic neuralgia improved; only mild dull ache remains. Taste and hearing normal. Face sensation normal. Separately, had attack of alcohol induced pancreatitits (2 weeks ago --> 3 days in hospital). Now recovered.   PRIOR HPI 10/02/15: 55 year old right-handed female here for evaluation of left-sided headaches and pain. May 2017 patient had sore throat and was diagnosed with viral infection. Symptoms resolved within a week or 2. In early June 2017, around June 9, patient had onset of pain in her left occipital region. She was then having intermittent lightening bolt sensations from top of her head into her left ear. Symptoms would last just 1-2 seconds at a time. Patient was having multiple episodes throughout the day, possibly over 100 per day. Patient having some blurred vision, nausea and vomiting, malaise sensation. Patient was evaluated by PCP clinic, tried on Imitrex and Decadron IM injections without relief. On 09/19/15 patient had CT scan of the head which is unremarkable. This is followed up with evaluation on 09/26/15 and then MRI of the brain was obtained which showed faint enhancement of the left facial nerve complex. Patient referred to me for further evaluation. Patient is also noted decreased taste sensation as well as increased sensitivity to sound. Symptoms have slightly improved, approximate 60% back to normal, and now she has only 2-3 flashes of pain per day. Patient had noted that she had some wax buildup and dried blood in her left ear. Patient has history of  chickenpox at age 55 years old with "anaphylactic reaction" requiring hospitalization. No history of shingles reactivation. Patient has history of "cluster headaches" when she was in her 55s. At that time she was having dull aching pain in her occipital and base of neck region. Patient denies any nausea, vomiting, sensitivity to light or sound, unilateral symptoms, conjunctival injection, tearing or nasal congestion at that time. She was not tried on oxygen therapy. She was tried on anti-inflammatory and then symptoms resolved within a few years.    REVIEW OF SYSTEMS: Full 14 system review of systems performed and negative with exception of: headache agitation abd pain nausea.  ALLERGIES: Allergies  Allergen Reactions  . Latex Rash    "hands turn beat red and start to itch"  . Atorvastatin Other (See Comments)    Swollen joints  . Dilaudid [Hydromorphone Hcl] Itching and Other (See Comments)    Itching- no rash- likely side effect but not true allergy.  Tolerates oxycodone.   . Lisinopril Other (See Comments)    Hives, presumed allergy  . Mirtazapine Swelling    swelling  . Nsaids     Gastritis 2013    HOME MEDICATIONS: Outpatient Medications Prior to Visit  Medication Sig Dispense Refill  . acetaminophen (TYLENOL) 325 MG tablet Take 2 tablets (650 mg total) by mouth every 6 (six) hours as needed for mild pain (or Fever >/= 101).    . cyanocobalamin (,VITAMIN B-12,) 1000 MCG/ML injection Inject 1,000 mcg into the skin every 8 (eight) weeks.    . cyclobenzaprine (FLEXERIL) 10 MG tablet Take 0.5-1 tablets (5-10 mg total)  by mouth 3 (three) times daily as needed (for migraine). 30 tablet 0  . escitalopram (LEXAPRO) 10 MG tablet Take 1 tablet (10 mg total) by mouth daily. 90 tablet 1  . metoprolol (LOPRESSOR) 50 MG tablet Take 1 tablet (50 mg total) by mouth 2 (two) times daily. 180 tablet 3  . pantoprazole (PROTONIX) 40 MG tablet Take 1 tablet (40 mg total) by mouth daily. 90 tablet 3  .  promethazine (PHENERGAN) 25 MG tablet Take 1 tablet (25 mg total) by mouth every 6 (six) hours as needed for nausea or vomiting. 30 tablet 2  . buPROPion (WELLBUTRIN SR) 150 MG 12 hr tablet Take 150 mg by mouth 2 (two) times daily.    Marland Kitchen docusate sodium (COLACE) 100 MG capsule Take 1 capsule (100 mg total) by mouth 2 (two) times daily as needed for mild constipation. 10 capsule 0   No facility-administered medications prior to visit.     PAST MEDICAL HISTORY: Past Medical History:  Diagnosis Date  . Alcoholism (HCC)   . Cholecystitis   . GERD (gastroesophageal reflux disease)   . Hypertension   . Pancreatitis 10/2015   2013 alcohol and/or gallstone  . Photosensitivity    (No formal dx of lupus) prev eval Dr. Gavin Potters in [facility]    PAST SURGICAL HISTORY: Past Surgical History:  Procedure Laterality Date  . APPENDECTOMY     As a child  . CAT scan  11/00   wnl  . CHOLECYSTECTOMY  04/20/2012   Procedure: LAPAROSCOPIC CHOLECYSTECTOMY WITH INTRAOPERATIVE CHOLANGIOGRAM;  Surgeon: Robyne Askew, MD;  Location: [facility] OR;  Service: General;  Laterality: N/A;  . DOBUTAMINE STRESS ECHO  11/00   wnl  . DOPPLER ECHOCARDIOGRAPHY     Secondary to Redux, wnl  . ESOPHAGOGASTRODUODENOSCOPY  8/01   Esophagitis [name])  . FOOT SURGERY     Pin in small toe  . 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 NSVD     X 2  . SBFT  08/01   wnl  . TONSILLECTOMY     As a child  . TUBAL LIGATION  Late 20's    FAMILY HISTORY: Family History  Problem Relation Age of Onset  . Heart disease Mother     CAD, angioplasty twice with stents  . Cancer Mother     Breast  . Breast cancer Mother 38  . Hypertension Mother   . Hypertension Father   . Hypothyroidism Father   . Heart disease Father     MI at age 75  . Cancer Father     H/O renal CA  . Diabetes Sister   . Cancer Sister     Renal CA  . Colon cancer Neg Hx     SOCIAL HISTORY:  Social History   Social History  . Marital  status: Divorced    Spouse name: N/A  . Number of children: 2  . Years of education: 14   Occupational History  . 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 Smoking status: Never Smoker  . Smokeless tobacco: Never Used  . Alcohol use Yes     Comment: rare as of 2016, 10/02/15 3-4 glasses wine weekly  . Drug use: No  . Sexual activity: Not on file   Other Topics Concern  . Not on file   Social History Narrative   Divorced, 2 grown children, lives with companion   Dental Hygienist   Caffeine  use- coffee in morning, Diet DP all day     PHYSICAL EXAM  GENERAL EXAM/CONSTITUTIONAL: Vitals:  Vitals:   11/13/15 1309  BP: 112/81  Pulse: (!) 55  Weight: 192 lb (87.1 kg)   Body mass index is 37.5 kg/m. No exam data present  Patient is in no distress; well developed, nourished and groomed; neck is supple  CARDIOVASCULAR:  Examination of carotid arteries is normal; no carotid bruits  Regular rate and rhythm, no murmurs  Examination of peripheral vascular system by observation and palpation is normal  EYES:  Ophthalmoscopic exam of optic discs and posterior segments is normal; no papilledema or hemorrhages  MUSCULOSKELETAL:  Gait, strength, tone, movements noted in Neurologic exam below  NEUROLOGIC: MENTAL STATUS:  No flowsheet data found.  awake, alert, oriented to person, place and time  recent and remote memory intact  normal attention and concentration  language fluent, comprehension intact, naming intact,   fund of knowledge appropriate  CRANIAL NERVE:   2nd - no papilledema on fundoscopic exam  2nd, 3rd, 4th, 6th - pupils equal and reactive to light, visual fields full to confrontation, extraocular muscles intact, no nystagmus  5th - facial sensation symm  7th - facial strength symmetric  8th - hearing intact  9th - palate elevates symmetrically, uvula midline  11th - shoulder shrug symmetric  12th - tongue  protrusion midline  MOTOR:   normal bulk and tone, full strength in the BUE, BLE  SENSORY:   normal and symmetric to light touch, temperature, vibration  COORDINATION:   finger-nose-finger, fine finger movements normal  REFLEXES:   deep tendon reflexes present and symmetric  GAIT/STATION:   narrow based gait; able to tandem; romberg is negative    DIAGNOSTIC DATA (LABS, IMAGING, TESTING) - I reviewed patient records, labs, notes, testing and imaging myself where available.  Lab Results  Component Value Date   WBC 10.8 10/27/2015   HGB 14.7 10/27/2015   HCT 41.9 10/27/2015   MCV 96.6 10/27/2015   PLT 235 10/27/2015      Component Value Date/Time   NA 133 (L) 10/27/2015 1420   NA 136 06/16/2013 1851   K 3.3 (L) 10/27/2015 1420   K 3.6 06/17/2013 0809   CL 103 10/27/2015 1420   CL 102 06/16/2013 1851   CO2 23 10/27/2015 1420   CO2 20 (L) 06/16/2013 1851   GLUCOSE 130 (H) 10/27/2015 1420   GLUCOSE 92 06/16/2013 1851   BUN 9 10/27/2015 1420   BUN 13 06/16/2013 1851   CREATININE 0.61 10/27/2015 1420   CREATININE 0.64 06/16/2013 1851   CALCIUM 8.2 (L) 10/27/2015 1420   CALCIUM 8.2 (L) 06/16/2013 1851   PROT 6.3 (L) 10/27/2015 1420   PROT 7.9 06/16/2013 1851   ALBUMIN 3.1 (L) 10/27/2015 1420   ALBUMIN 3.9 06/16/2013 1851   AST 29 10/27/2015 1420   AST 48 (H) 06/17/2013 0809   ALT 16 10/27/2015 1420   ALT 29 06/17/2013 0809   ALKPHOS 64 10/27/2015 1420   ALKPHOS 61 06/16/2013 1851   BILITOT 1.2 10/27/2015 1420   BILITOT 1.1 (H) 06/16/2013 1851   GFRNONAA >60 10/27/2015 1420   GFRNONAA >60 06/16/2013 1851   GFRAA >60 10/27/2015 1420   GFRAA >60 06/16/2013 1851   Lab Results  Component Value Date   CHOL 208 (H) 05/28/2014   HDL 105.30 05/28/2014   LDLCALC 67 05/28/2014   TRIG 177.0 (H) 05/28/2014   CHOLHDL 2 05/28/2014   No results found for: HGBA1C  Lab Results  Component Value Date   VITAMINB12 289 08/27/2015   Lab Results  Component Value  Date   TSH 0.863 07/02/2006    06/17/13 EKG [I reviewed images myself and agree with interpretation. -VRP]  - Normal sinus rhythm - Prolonged QT (QTc 484) - Abnormal ECG - When compared with ECG of 16-Jun-2013 18:52, Vent. rate has decreased BY 33 BPM T wave inversion no longer evident in Anterior leads  09/26/15 MRI brain [I reviewed images myself and agree with interpretation. -VRP]  1. Subtle asymmetric enlargement in the left internal auditory canal and along the left facial nerve mastoid segment. This might reflect a viral left seventh cranial neuritis in this clinical setting. Follow-up dedicated IAC Protocol Head MRI Without And With Contrast may be valuable. 2. Otherwise normal for age MRI appearance of the brain. Visible bilateral fifth cranial nerve segments appear within normal limits.    ASSESSMENT AND PLAN  55 y.o. year old female here with new onset pain in left ear, throat, scalp, face, associated with decreased taste and increased hearing sensitivity in June 2017. Patient has slightly decreased sensation in left V1, V2, V3 distribution, dried blood and wax buildup in the left external auditory canal, history of chickenpox as a child. 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 Although symptoms started 3 weeks prior to initial neurology evaluation, patient still having significant pain and symptoms, therefore will started antiviral and prednisone treatment. Now with significant improvement.   Dx:  1. 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 gabapentin (NEURONTIN) 300 MG capsule    Sig: Take 1 capsule (300 mg total) by mouth 2 (two) times daily.    Dispense:  90 capsule    Refill:  6   Return in about 4 months (around 03/14/2016).   Suanne MarkerVIKRAM R. PENUMALLI, MD 11/13/2015,  1:34 PM Certified in Neurology, Neurophysiology and Neuroimaging  [facility] [address], [address] [place], [place] [address] [phone]

## 2015-11-13 NOTE — Patient Instructions (Signed)
-   continue gabapentin; may increase to three times per day

## 2015-11-26 ENCOUNTER — Other Ambulatory Visit: Payer: Self-pay | Admitting: Family Medicine

## 2015-11-26 ENCOUNTER — Encounter: Payer: Self-pay | Admitting: Family Medicine

## 2015-11-27 MED ORDER — CYCLOBENZAPRINE HCL 10 MG PO TABS: 5.0000 mg | ORAL_TABLET | Freq: Three times a day (TID) | ORAL | 0 refills | Status: DC | PRN

## 2015-11-27 NOTE — Telephone Encounter (Signed)
Sent!

## 2015-12-12 ENCOUNTER — Other Ambulatory Visit: Payer: Self-pay | Admitting: Family Medicine

## 2015-12-12 NOTE — Telephone Encounter (Signed)
MyChart RF request for Cyclobenzaprine.  Last Filled:    30 tablet 0 11/27/2015  Please advise.

## 2015-12-13 MED ORDER — CYCLOBENZAPRINE HCL 10 MG PO TABS: 5.0000 mg | ORAL_TABLET | Freq: Three times a day (TID) | ORAL | 0 refills | Status: DC | PRN

## 2015-12-13 NOTE — Telephone Encounter (Signed)
Sent. Thanks.   

## 2015-12-24 ENCOUNTER — Ambulatory Visit (INDEPENDENT_AMBULATORY_CARE_PROVIDER_SITE_OTHER): Payer: BLUE CROSS/BLUE SHIELD

## 2015-12-24 DIAGNOSIS — E538 Deficiency of other specified B group vitamins: Secondary | ICD-10-CM

## 2015-12-24 DIAGNOSIS — Z23 Encounter for immunization: Secondary | ICD-10-CM | POA: Diagnosis not present

## 2015-12-24 MED ORDER — CYANOCOBALAMIN 1000 MCG/ML IJ SOLN
1000.0000 ug | Freq: Once | INTRAMUSCULAR | Status: AC
  Administered 2015-12-24: 1000 ug via INTRAMUSCULAR

## 2016-01-09 ENCOUNTER — Other Ambulatory Visit: Payer: Self-pay | Admitting: Family Medicine

## 2016-01-09 NOTE — Telephone Encounter (Signed)
Electronic refill request. Last Filled:    30 tablet 2 08/27/2015  Please advise.

## 2016-01-10 ENCOUNTER — Ambulatory Visit (INDEPENDENT_AMBULATORY_CARE_PROVIDER_SITE_OTHER): Payer: BLUE CROSS/BLUE SHIELD | Admitting: Primary Care

## 2016-01-10 ENCOUNTER — Ambulatory Visit: Payer: BLUE CROSS/BLUE SHIELD | Admitting: Family Medicine

## 2016-01-10 ENCOUNTER — Encounter: Payer: Self-pay | Admitting: Primary Care

## 2016-01-10 VITALS — BP 154/100 | HR 66 | Temp 97.3°F | Wt 199.8 lb

## 2016-01-10 DIAGNOSIS — R519 Headache, unspecified: Secondary | ICD-10-CM

## 2016-01-10 DIAGNOSIS — R51 Headache: Secondary | ICD-10-CM

## 2016-01-10 DIAGNOSIS — R202 Paresthesia of skin: Secondary | ICD-10-CM

## 2016-01-10 MED ORDER — CYCLOBENZAPRINE HCL 10 MG PO TABS: 5.0000 mg | ORAL_TABLET | Freq: Three times a day (TID) | ORAL | 0 refills | Status: DC | PRN

## 2016-01-10 MED ORDER — VALACYCLOVIR HCL 1 G PO TABS: 1000.0000 mg | ORAL_TABLET | Freq: Three times a day (TID) | ORAL | 0 refills | Status: DC

## 2016-01-10 NOTE — Progress Notes (Signed)
Subjective:    Patient ID: Abigail [name], female    DOB: [birth year], 55 y.o.   MRN: [id number]  HPI  Abigail [name] is a 55 year old female who presents today with a chief complaint of oral tingling. She also reports headache to the left parietal and occipital lobe that began several days ago. This morning she noticed tingling and swelling to the left upper lip. She applied Zovirax to her lips this morning with some improvement. She also reports left ear pain that has been present for the past several. She was in contact with a patient yesterday who had an open lesion from shingles. Denies fevers, chills, cough, history of HSV, photophobia, nausea.   She has a history of shingles years ago and was treated with Valtrex, steroids, and pain medication. This feels like her prior episode of shingles.   Review of Systems  Constitutional: Negative for chills and fever.  Skin: Negative for color change.       Tenderness and swelling to left upper lip. No lesion.  Neurological: Positive for numbness and headaches.       Past Medical History:  Diagnosis Date  . Alcoholism (HCC)   . Cholecystitis   . GERD (gastroesophageal reflux disease)   . Hypertension   . Pancreatitis 10/2015   2013 alcohol and/or gallstone  . Photosensitivity    (No formal dx of lupus) prev eval Dr. Gavin Potters in [place]     Social History   Social History  . Marital status: Divorced    Spouse name: N/A  . Number of children: 2  . Years of education: 14   Occupational History  . 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 Smoking status: Never Smoker  . Smokeless tobacco: Never Used  . Alcohol use Yes     Comment: rare as of 2016, 10/02/15 3-4 glasses wine weekly  . Drug use: No  . Sexual activity: Not on file   Other Topics Concern  . Not on file   Social History Narrative   Divorced, 2 grown children, lives with companion   Dental Hygienist   Caffeine use- coffee in morning,  Diet DP all day    Past Surgical History:  Procedure Laterality Date  . APPENDECTOMY     As a child  . CAT scan  11/00   wnl  . CHOLECYSTECTOMY  04/20/2012   Procedure: LAPAROSCOPIC CHOLECYSTECTOMY WITH INTRAOPERATIVE CHOLANGIOGRAM;  Surgeon: Robyne Askew, MD;  Location: [facility] OR;  Service: General;  Laterality: N/A;  . DOBUTAMINE STRESS ECHO  11/00   wnl  . DOPPLER ECHOCARDIOGRAPHY     Secondary to Redux, wnl  . ESOPHAGOGASTRODUODENOSCOPY  8/01   Esophagitis [name])  . FOOT SURGERY     Pin in small toe  . 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 NSVD     X 2  . SBFT  08/01   wnl  . TONSILLECTOMY     As a child  . TUBAL LIGATION  Late 20's    Family History  Problem Relation Age of Onset  . Heart disease Mother     CAD, angioplasty twice with stents  . Cancer Mother     Breast  . Breast cancer Mother 64  . Hypertension Mother   . Hypertension Father   . Hypothyroidism Father   . Heart disease Father     MI at age 18  . Cancer Father  H/O renal CA  . Diabetes Sister   . Cancer Sister     Renal CA  . Colon cancer Neg Hx     Allergies  Allergen Reactions  . Latex Rash    "hands turn beat red and start to itch"  . Atorvastatin Other (See Comments)    Swollen joints  . Dilaudid [Hydromorphone Hcl] Itching and Other (See Comments)    Itching- no rash- likely side effect but not true allergy.  Tolerates oxycodone.   . Lisinopril Other (See Comments)    Hives, presumed allergy  . Mirtazapine Swelling    swelling  . Nsaids     Gastritis 2013    Current Outpatient Prescriptions on File Prior to Visit  Medication Sig Dispense Refill  . acetaminophen (TYLENOL) 325 MG tablet Take 2 tablets (650 mg total) by mouth every 6 (six) hours as needed for mild pain (or Fever >/= 101).    . cyanocobalamin (,VITAMIN B-12,) 1000 MCG/ML injection Inject 1,000 mcg into the skin every 8 (eight) weeks.    Marland Kitchen. escitalopram (LEXAPRO) 10 MG tablet Take 1 tablet (10 mg  total) by mouth daily. 90 tablet 1  . gabapentin (NEURONTIN) 300 MG capsule Take 1 capsule (300 mg total) by mouth 2 (two) times daily. 90 capsule 6  . metoprolol (LOPRESSOR) 50 MG tablet Take 1 tablet (50 mg total) by mouth 2 (two) times daily. 180 tablet 3  . pantoprazole (PROTONIX) 40 MG tablet Take 1 tablet (40 mg total) by mouth daily. 90 tablet 3  . promethazine (PHENERGAN) 25 MG tablet TAKE 1 TABLET (25 MG TOTAL) BY MOUTH EVERY 6 (SIX) HOURS AS NEEDED FOR NAUSEA OR VOMITING. 30 tablet 1   No current facility-administered medications on file prior to visit.     BP (!) 154/100   Pulse 66   Temp 97.3 F (36.3 C) (Oral)   Wt 199 lb 12.8 oz (90.6 kg)   LMP  (LMP Unknown)   SpO2 97%   BMI 39.02 kg/m    Objective:   Physical Exam  Constitutional: She is oriented to person, place, and time. She appears well-nourished.  Eyes: EOM are normal. Pupils are equal, round, and reactive to light.  Cardiovascular: Normal rate and regular rhythm.   Pulmonary/Chest: Effort normal and breath sounds normal.  Neurological: She is alert and oriented to person, place, and time. No cranial nerve deficit.  Very slight decrease in sensation to left lower cheek vs right, otherwise no difference bilaterally.    Skin: Skin is warm and dry. No erythema.  No obvious lesion.          Assessment & Plan:  Facial Tingling:  Also with headache, tenderness to outward ear and skin of jaw line. Follows line of dermatome, however no obvious lesions. Will test for HSV as she is uncertain of prior history. Given symptoms, examination, and this feeling like prior shingles episode, will treat with Valtrex course.  Labs pending for HSV evaluation. Refill of flexeril provided for headache. Education provided regarding shingles.  Morrie Sheldonlark,Artemus Romanoff Kendal, NP

## 2016-01-10 NOTE — Progress Notes (Signed)
Pre visit review using our clinic review tool, if applicable. No additional management support is needed unless otherwise documented below in the visit note. 

## 2016-01-10 NOTE — Telephone Encounter (Signed)
Sent. Thanks.   

## 2016-01-10 NOTE — Patient Instructions (Signed)
Start Valtrex tablets. Take 1 tablet by mouth three times daily for 7 days.  Complete lab work prior to leaving today. I will notify you of your results once received.   It was a pleasure meeting you!

## 2016-01-14 LAB — HSV(HERPES SMPLX)ABS-I+II(IGG+IGM)-BLD
HSV 1 Glycoprotein G Ab, IgG: 52.7 Index — ABNORMAL HIGH (ref <0.90)
HSV 2 Glycoprotein G Ab, IgG: <0.9 Index (ref <0.90)
Herpes Simplex Vrs I&II-IgM Ab (EIA): 0.61 INDEX

## 2016-01-21 ENCOUNTER — Other Ambulatory Visit: Payer: Self-pay | Admitting: Family Medicine

## 2016-01-21 NOTE — Telephone Encounter (Signed)
Electronic refill request. Last Filled:    30 tablet 1 01/10/2016  Please advise.

## 2016-01-22 NOTE — Telephone Encounter (Signed)
Spoke to patient and was advised that she just picked up a refill last week and does not need a script at this time. Patient stated that she only takes this as needed when she is nauseated. Patient stated that she is on automatic refills and will call pharmacy and tell them to take this medication off of the auto refills.

## 2016-01-22 NOTE — Telephone Encounter (Signed)
Thanks

## 2016-01-22 NOTE — Telephone Encounter (Signed)
Verify with patient.  What is going on?  She had #30 with 1RF on 01/10/16.  Thanks.

## 2016-02-11 ENCOUNTER — Ambulatory Visit (INDEPENDENT_AMBULATORY_CARE_PROVIDER_SITE_OTHER): Payer: BLUE CROSS/BLUE SHIELD | Admitting: Podiatry

## 2016-02-11 ENCOUNTER — Ambulatory Visit (INDEPENDENT_AMBULATORY_CARE_PROVIDER_SITE_OTHER): Payer: BLUE CROSS/BLUE SHIELD

## 2016-02-11 DIAGNOSIS — M79673 Pain in unspecified foot: Secondary | ICD-10-CM

## 2016-02-11 DIAGNOSIS — R52 Pain, unspecified: Secondary | ICD-10-CM

## 2016-02-11 DIAGNOSIS — M7751 Other enthesopathy of right foot: Secondary | ICD-10-CM

## 2016-02-11 DIAGNOSIS — M79671 Pain in right foot: Secondary | ICD-10-CM | POA: Diagnosis not present

## 2016-02-11 MED ORDER — BETAMETHASONE SOD PHOS & ACET 6 (3-3) MG/ML IJ SUSP: 3.0000 mg | Freq: Once | INTRAMUSCULAR | Status: DC

## 2016-02-11 MED ORDER — NONFORMULARY OR COMPOUNDED ITEM: 1.0000 g | Freq: Four times a day (QID) | 2 refills | Status: DC

## 2016-02-11 NOTE — Addendum Note (Signed)
Addended by: Renaldo ReelPARRY, MELODY A on: 02/11/2016 05:01 PM   Modules accepted: Orders

## 2016-02-11 NOTE — Progress Notes (Signed)
Subjective:  Patient presents today for pain and tenderness to the right foot. Patient states the outside of her foot has been hurting him painful for about 2-3 months now. Patient has had previous surgery by Dr. Charlsie Merlesregal 20+ years ago consisting of a tailor's bunionectomy right foot. Patient also had recent surgery by Dr. Marylene LandStover for left foot bunionectomy. Patient presents today for further treatment and evaluation    Objective/Physical Exam General: The patient is alert and oriented x3 in no acute distress.  Dermatology: Skin is warm, dry and supple bilateral lower extremities. Negative for open lesions or macerations.  Vascular: Palpable pedal pulses bilaterally. No edema or erythema noted. Capillary refill within normal limits.  Neurological: Epicritic and protective threshold grossly intact bilaterally.   Musculoskeletal Exam: Pain on palpation noted to the fifth metatarsal tubercle of the right foot at the insertion of the peroneal brevis tendon. Pain with forced eversion of the foot .Range of motion within normal limits to all pedal and ankle joints bilateral. Muscle strength 5/5 in all groups bilateral.   Radiographic Exam:  Normal osseous mineralization. Joint spaces preserved. No fracture/dislocation/boney destruction.    Assessment: #1 peroneal enthesopathy right foot #2 pain in right foot   Plan of Care:  #1 Patient was evaluated. #2 injection of 0.5 mL Celestone Soluspan injected in the patient's right foot tendon sheath at the peroneal brevis insertion site. 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 Felecia ShellingBrent M. Evans, DPM [facility]

## 2016-02-14 ENCOUNTER — Encounter: Payer: Self-pay | Admitting: Diagnostic Neuroimaging

## 2016-02-18 ENCOUNTER — Ambulatory Visit (INDEPENDENT_AMBULATORY_CARE_PROVIDER_SITE_OTHER): Payer: BLUE CROSS/BLUE SHIELD | Admitting: *Deleted

## 2016-02-18 DIAGNOSIS — E538 Deficiency of other specified B group vitamins: Secondary | ICD-10-CM

## 2016-02-18 MED ORDER — CYANOCOBALAMIN 1000 MCG/ML IJ SOLN
1000.0000 ug | INTRAMUSCULAR | Status: DC
  Administered 2016-02-18 – 2016-05-19 (×3): 1000 ug via INTRAMUSCULAR

## 2016-02-26 ENCOUNTER — Ambulatory Visit (INDEPENDENT_AMBULATORY_CARE_PROVIDER_SITE_OTHER): Payer: BLUE CROSS/BLUE SHIELD | Admitting: Primary Care

## 2016-02-26 ENCOUNTER — Encounter: Payer: Self-pay | Admitting: Primary Care

## 2016-02-26 VITALS — BP 136/86 | HR 72 | Temp 98.1°F | Wt 200.8 lb

## 2016-02-26 DIAGNOSIS — R51 Headache: Secondary | ICD-10-CM | POA: Diagnosis not present

## 2016-02-26 DIAGNOSIS — M25512 Pain in left shoulder: Secondary | ICD-10-CM | POA: Diagnosis not present

## 2016-02-26 DIAGNOSIS — R519 Headache, unspecified: Secondary | ICD-10-CM

## 2016-02-26 MED ORDER — PREDNISONE 10 MG PO TABS: ORAL_TABLET | ORAL | 0 refills | Status: DC

## 2016-02-26 MED ORDER — CYCLOBENZAPRINE HCL 10 MG PO TABS: 5.0000 mg | ORAL_TABLET | Freq: Three times a day (TID) | ORAL | 0 refills | Status: DC | PRN

## 2016-02-26 NOTE — Progress Notes (Signed)
Pre visit review using our clinic review tool, if applicable. No additional management support is needed unless otherwise documented below in the visit note. 

## 2016-02-26 NOTE — Progress Notes (Signed)
Subjective:    Patient ID: Abigail [name], female    DOB: Nov 09, 1960, 55 y.o.   MRN: [id number]  HPI  Ms. Abigail [name] is a 55 year old female who presents today with a chief complaint of shoulder pain. Her pain is located to the left shoulder that she first noticed yesterday after she picked up a large crockpot. Her shoulder went into a big cramp and then went numb. She's experiencing tingling to her fingers and numbness to the lower portion of her left upper extremity. She denies neck and back pain. She's not taken anything OTC for her symptoms. Her pain is worse with raising her shoulder above her head.   Review of Systems  Musculoskeletal: Positive for arthralgias and myalgias.  Skin: Negative for color change.  Neurological: Negative for weakness.       Past Medical History:  Diagnosis Date  . Alcoholism (HCC)   . Cholecystitis   . GERD (gastroesophageal reflux disease)   . Hypertension   . Pancreatitis 10/2015   2013 alcohol and/or gallstone  . Photosensitivity    (No formal dx of lupus) [place] [facility] [place]     Social History   Social History  . Marital status: Divorced    Spouse name: N/A  . Number of children: 2  . Years of education: 14   Occupational History  . 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 Smoking status: Never Smoker  . Smokeless tobacco: Never Used  . Alcohol use Yes     Comment: rare as of 2016, 10/02/15 3-4 glasses wine weekly  . Drug use: No  . Sexual activity: Not on file   Other Topics Concern  . Not on file   Social History Narrative   Divorced, 2 grown children, lives with companion   Dental Hygienist   Caffeine use- coffee in morning, Diet DP all day    Past Surgical History:  Procedure Laterality Date  . APPENDECTOMY     As a child  . CAT scan  11/00   wnl  . CHOLECYSTECTOMY  04/20/2012   Procedure: LAPAROSCOPIC CHOLECYSTECTOMY WITH INTRAOPERATIVE CHOLANGIOGRAM;  Surgeon: Robyne AskewPaul S Toth  III, MD;  [facility];  Service: General;  Laterality: N/A;  . DOBUTAMINE STRESS ECHO  11/00   wnl  . DOPPLER ECHOCARDIOGRAPHY     Secondary to Redux, wnl  . ESOPHAGOGASTRODUODENOSCOPY  8/01   Esophagitis [name]([facility])  . FOOT SURGERY     Pin in small toe  . 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 NSVD     X 2  . SBFT  08/01   wnl  . TONSILLECTOMY     As a child  . TUBAL LIGATION  Late 20's    Family History  Problem Relation Age of Onset  . Heart disease Mother     CAD, angioplasty twice with stents  . Cancer Mother     Breast  . Breast cancer Mother [id number]  . Hypertension Mother   . Hypertension Father   . Hypothyroidism Father   . Heart disease Father     MI at age 53  . Cancer Father     H/O renal CA  . Diabetes Sister   . Cancer Sister     Renal CA  . Colon cancer Neg Hx     Allergies  Allergen Reactions  . Latex Rash    "hands turn beat red and start  to itch"  . Atorvastatin Other (See Comments)    Swollen joints  . Dilaudid [Hydromorphone Hcl] Itching and Other (See Comments)    Itching- no rash- likely side effect but not true allergy.  Tolerates oxycodone.   . Lisinopril Other (See Comments)    Hives, presumed allergy  . Mirtazapine Swelling    swelling  . Nsaids     Gastritis 2013    Current Outpatient Prescriptions on File Prior to Visit  Medication Sig Dispense Refill  . acetaminophen (TYLENOL) 325 MG tablet Take 2 tablets (650 mg total) by mouth every 6 (six) hours as needed for mild pain (or Fever >/= 101).    . cyanocobalamin (,VITAMIN B-12,) 1000 MCG/ML injection Inject 1,000 mcg into the skin every 8 (eight) weeks.    Marland Kitchen. escitalopram (LEXAPRO) 10 MG tablet Take 1 tablet (10 mg total) by mouth daily. 90 tablet 1  . gabapentin (NEURONTIN) 300 MG capsule Take 1 capsule (300 mg total) by mouth 2 (two) times daily. 90 capsule 6  . metoprolol (LOPRESSOR) 50 MG tablet Take 1 tablet (50 mg total) by mouth 2 (two) times daily. 180 tablet 3  .  NONFORMULARY OR COMPOUNDED ITEM Apply 1-2 g topically 4 (four) times daily. 120 each 2  . pantoprazole (PROTONIX) 40 MG tablet Take 1 tablet (40 mg total) by mouth daily. 90 tablet 3  . promethazine (PHENERGAN) 25 MG tablet TAKE 1 TABLET (25 MG TOTAL) BY MOUTH EVERY 6 (SIX) HOURS AS NEEDED FOR NAUSEA OR VOMITING. 30 tablet 1  . valACYclovir (VALTREX) 1000 MG tablet Take 1 tablet (1,000 mg total) by mouth 3 (three) times daily. 21 tablet 0   Current Facility-Administered Medications on File Prior to Visit  Medication Dose Route Frequency Provider Last Rate Last Dose  . betamethasone acetate-betamethasone sodium phosphate (CELESTONE) injection 3 mg  3 mg Intramuscular Once Felecia ShellingBrent M Evans, DPM      . cyanocobalamin ((VITAMIN B-12)) injection 1,000 mcg  1,000 mcg Intramuscular Q8 Weeks Joaquim NamGraham S Duncan, MD   1,000 mcg at 02/18/16 1436    BP 136/86   Pulse 72   Temp 98.1 F (36.7 C) (Oral)   Wt 200 lb 12.8 oz (91.1 kg)   LMP  (LMP Unknown)   SpO2 95%   BMI 39.22 kg/m    Objective:   Physical Exam  Constitutional: She appears well-nourished.  Cardiovascular: Normal rate and regular rhythm.   Pulses:      Radial pulses are 2+ on the right side, and 2+ on the left side.  Pulmonary/Chest: Effort normal and breath sounds normal.  Musculoskeletal:       Left shoulder: She exhibits decreased range of motion, tenderness, pain and spasm. She exhibits no bony tenderness, no swelling, no crepitus, no deformity, normal pulse and normal strength.  Decreased range of motion with abduction. Muscular tenderness to left deltoid.  Neurological:  Decrease sensation to left first digit  Skin: Skin is warm and dry.          Assessment & Plan:  Shoulder pain:  Occurred yesterday after lifting a heavy crockpot. Exam today with obvious decreased range of motion due to pain, especially with abduction. No weakness. Suspect nerve impingement and muscle spasm given symptoms and examination today. Will  treat with low-dose steroid taper and when necessary Flexeril. Discussed application of heat. No need for imaging given lack of trauma. She will notify us if no improvement within one week.  Morrie Sheldonlark,Katherine Kendal, NP

## 2016-02-26 NOTE — Patient Instructions (Signed)
Start prednisone tablets. Take three tablets for 2 days, then two tablets for 2 days, then one tablet for 2 days.  You may take Flexeril three times daily as needed for muscle spasms. Caution as this may cause drowsiness.  Apply a heating pad to your shoulder three times daily for 30 minute intervals.  Please notify us if no improvement by Monday next week.  It was a pleasure meeting you!

## 2016-03-03 ENCOUNTER — Ambulatory Visit (INDEPENDENT_AMBULATORY_CARE_PROVIDER_SITE_OTHER): Payer: BLUE CROSS/BLUE SHIELD | Admitting: Podiatry

## 2016-03-03 ENCOUNTER — Encounter: Payer: Self-pay | Admitting: Podiatry

## 2016-03-03 VITALS — BP 145/88 | HR 68 | Resp 16

## 2016-03-03 DIAGNOSIS — M79671 Pain in right foot: Secondary | ICD-10-CM

## 2016-03-03 DIAGNOSIS — M7751 Other enthesopathy of right foot: Secondary | ICD-10-CM | POA: Diagnosis not present

## 2016-03-03 DIAGNOSIS — R600 Localized edema: Secondary | ICD-10-CM

## 2016-03-04 MED ORDER — BETAMETHASONE SOD PHOS & ACET 6 (3-3) MG/ML IJ SUSP: 3.0000 mg | Freq: Once | INTRAMUSCULAR | Status: DC

## 2016-03-04 NOTE — Progress Notes (Signed)
Subjective:  Patient presents today for follow-up evaluation of peroneal enthesopathy right foot. Patient still has some burning sensation when he is on his feet. Patient states that the topical anti-inflammatory that was [facility] is causing her skin to pill. Patient presents today for further treatment and evaluation. Patient has had previous surgery by Dr. Charlsie Merlesregal 20+ years ago consisting of a tailor's bunionectomy right foot. Patient also had recent surgery by Dr. Marylene LandStover for left foot bunionectomy. Patient presents today for further treatment and evaluation    Objective/Physical Exam General: The patient is alert and oriented x3 in no acute distress.  Dermatology: Skin is warm, dry and supple bilateral lower extremities. Negative for open lesions or macerations.  Vascular: Palpable pedal pulses bilaterally. No edema or erythema noted. Capillary refill within normal limits.  Neurological: Epicritic and protective threshold grossly intact bilaterally.   Musculoskeletal Exam: Pain on palpation noted to the fifth metatarsal tubercle of the right foot at the insertion of the peroneal brevis tendon. Pain with forced eversion of the foot .Range of motion within normal limits to all pedal and ankle joints bilateral. Muscle strength 5/5 in all groups bilateral.   Radiographic Exam:  Normal osseous mineralization. Joint spaces preserved. No fracture/dislocation/boney destruction.    Assessment: #1 peroneal enthesopathy right foot #2 pain in right foot   Plan of Care:  #1 Patient was evaluated. #2 injection of 0.5 mL Celestone Soluspan injected in the patient's right foot tendon sheath at the peroneal brevis insertion site. 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 Felecia ShellingBrent M. Evans, DPM [facility]

## 2016-03-13 ENCOUNTER — Telehealth: Payer: Self-pay | Admitting: Diagnostic Neuroimaging

## 2016-03-13 NOTE — Telephone Encounter (Signed)
Patient called requesting to reschedule the follow up that we cancelled due to Dr. Marjory LiesPenumalli being out of the office. The next available apt is in February and the patient states that some of her symptoms are reoccurring and she would like to be seen before then. Please call and advise.

## 2016-03-13 NOTE — Telephone Encounter (Signed)
Yes ok to see NP. I will be available in office. -VRP

## 2016-03-16 NOTE — Telephone Encounter (Signed)
LVM advising patient that Dr Marjory LiesPenumalli stated she may see a NP on a date that he is in the office. Requested she call back to schedule follow up with NP. Left name,  Number.

## 2016-03-17 ENCOUNTER — Ambulatory Visit: Payer: BLUE CROSS/BLUE SHIELD | Admitting: Diagnostic Neuroimaging

## 2016-03-19 ENCOUNTER — Ambulatory Visit (INDEPENDENT_AMBULATORY_CARE_PROVIDER_SITE_OTHER): Payer: BLUE CROSS/BLUE SHIELD | Admitting: Diagnostic Neuroimaging

## 2016-03-19 ENCOUNTER — Encounter: Payer: Self-pay | Admitting: Diagnostic Neuroimaging

## 2016-03-19 VITALS — BP 128/79 | HR 76 | Wt 202.6 lb

## 2016-03-19 DIAGNOSIS — B0221 Postherpetic geniculate ganglionitis: Secondary | ICD-10-CM | POA: Diagnosis not present

## 2016-03-19 DIAGNOSIS — R252 Cramp and spasm: Secondary | ICD-10-CM

## 2016-03-19 MED ORDER — GABAPENTIN 300 MG PO CAPS: 300.0000 mg | ORAL_CAPSULE | Freq: Two times a day (BID) | ORAL | 12 refills | Status: DC
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
## 2016-03-24 ENCOUNTER — Telehealth: Payer: Self-pay

## 2016-03-24 NOTE — Telephone Encounter (Signed)
Pt request copy of Hep B titer done at least 10 years ago by Dr Hetty ElySchaller. Unable to find results and Terri in lab said would need to get another lab test. Pt voiced understanding.

## 2016-03-31 ENCOUNTER — Ambulatory Visit: Payer: BLUE CROSS/BLUE SHIELD | Admitting: Podiatry

## 2016-04-02 ENCOUNTER — Ambulatory Visit: Payer: BLUE CROSS/BLUE SHIELD | Admitting: Diagnostic Neuroimaging

## 2016-04-06 ENCOUNTER — Other Ambulatory Visit: Payer: Self-pay | Admitting: Family Medicine

## 2016-04-06 DIAGNOSIS — E538 Deficiency of other specified B group vitamins: Secondary | ICD-10-CM

## 2016-04-06 DIAGNOSIS — Z119 Encounter for screening for infectious and parasitic diseases, unspecified: Secondary | ICD-10-CM

## 2016-04-06 DIAGNOSIS — I10 Essential (primary) hypertension: Secondary | ICD-10-CM

## 2016-04-07 ENCOUNTER — Ambulatory Visit: Payer: BLUE CROSS/BLUE SHIELD | Admitting: Podiatry

## 2016-04-09 ENCOUNTER — Other Ambulatory Visit: Payer: BLUE CROSS/BLUE SHIELD

## 2016-04-13 ENCOUNTER — Encounter: Payer: BLUE CROSS/BLUE SHIELD | Admitting: Family Medicine

## 2016-04-14 ENCOUNTER — Ambulatory Visit (INDEPENDENT_AMBULATORY_CARE_PROVIDER_SITE_OTHER): Payer: BLUE CROSS/BLUE SHIELD

## 2016-04-14 DIAGNOSIS — E538 Deficiency of other specified B group vitamins: Secondary | ICD-10-CM | POA: Diagnosis not present

## 2016-04-17 ENCOUNTER — Ambulatory Visit (INDEPENDENT_AMBULATORY_CARE_PROVIDER_SITE_OTHER): Payer: BLUE CROSS/BLUE SHIELD | Admitting: Podiatry

## 2016-04-17 DIAGNOSIS — R600 Localized edema: Secondary | ICD-10-CM

## 2016-04-17 DIAGNOSIS — M7751 Other enthesopathy of right foot: Secondary | ICD-10-CM

## 2016-04-17 DIAGNOSIS — M79671 Pain in right foot: Secondary | ICD-10-CM

## 2016-04-22 MED ORDER — BETAMETHASONE SOD PHOS & ACET 6 (3-3) MG/ML IJ SUSP: 3.0000 mg | Freq: Once | INTRAMUSCULAR | Status: DC

## 2016-04-22 NOTE — Progress Notes (Signed)
   Subjective:  Patient presents today for follow-up right foot pain. Patient has been diagnosed with peroneal enthesopathy right foot. Patient states that she feels much better. She states that the last injection helped.    Objective/Physical Exam General: The patient is alert and oriented x3 in no acute distress.  Dermatology: Skin is warm, dry and supple bilateral lower extremities. Negative for open lesions or macerations.  Vascular: Palpable pedal pulses bilaterally. No edema or erythema noted. Capillary refill within normal limits.  Neurological: Epicritic and protective threshold grossly intact bilaterally.   Musculoskeletal Exam: Range of motion within normal limits to all pedal and ankle joints bilateral. Muscle strength 5/5 in all groups bilateral.    Assessment: #1 peroneal enthesopathy right foot #2 pain in right foot   Plan of Care:  #1 Patient was evaluated. #2 injection of 0.5 mL Celestone Soluspan injected in the patient's right foot at the insertion of the peroneal brevis tendon into the fifth metatarsal tubercle #3 continue all the conservative modalities #4 return to clinic in 4 weeks   Felecia ShellingBrent M. Dajia Gunnels, DPM [facility]  Dr. Felecia ShellingBrent M. Teyton Pattillo, DPM    [address]                                        [place], [place] [address]                Office [phone](336) [phone]  Fax [phone](336) [phone]

## 2016-04-29 ENCOUNTER — Other Ambulatory Visit: Payer: Self-pay | Admitting: Family Medicine

## 2016-04-29 NOTE — Telephone Encounter (Signed)
Please get update from patient.  Okay to send if still on.  Thanks.  Please let me know.

## 2016-04-29 NOTE — Telephone Encounter (Signed)
Spoke to pt. She is not on the medication due to mood alterations it caused.

## 2016-04-29 NOTE — Telephone Encounter (Signed)
Ok to refill? Not on current med list. Looks like it was dc'd at neuro visit in December according to med list?

## 2016-05-19 ENCOUNTER — Ambulatory Visit (INDEPENDENT_AMBULATORY_CARE_PROVIDER_SITE_OTHER): Payer: BLUE CROSS/BLUE SHIELD | Admitting: Podiatry

## 2016-05-19 ENCOUNTER — Ambulatory Visit (INDEPENDENT_AMBULATORY_CARE_PROVIDER_SITE_OTHER): Payer: BLUE CROSS/BLUE SHIELD | Admitting: *Deleted

## 2016-05-19 DIAGNOSIS — M7752 Other enthesopathy of left foot: Secondary | ICD-10-CM | POA: Diagnosis not present

## 2016-05-19 DIAGNOSIS — M779 Enthesopathy, unspecified: Secondary | ICD-10-CM | POA: Diagnosis not present

## 2016-05-19 DIAGNOSIS — M778 Other enthesopathies, not elsewhere classified: Secondary | ICD-10-CM

## 2016-05-19 DIAGNOSIS — E538 Deficiency of other specified B group vitamins: Secondary | ICD-10-CM

## 2016-05-19 MED ORDER — BETAMETHASONE SOD PHOS & ACET 6 (3-3) MG/ML IJ SUSP: 3.0000 mg | Freq: Once | INTRAMUSCULAR | Status: DC

## 2016-05-19 NOTE — Progress Notes (Signed)
   Subjective:  Patient presents today for evaluation of left foot pain. Patient does have a history of peroneal enthesopathy to the right foot which has resolved. Patient with recently went on a trip to [place] and she states that she was in excessive pain during long periods of walking during her trip.    Objective/Physical Exam General: The patient is alert and oriented x3 in no acute distress.  Dermatology: Skin is warm, dry and supple bilateral lower extremities. Negative for open lesions or macerations.  Vascular: Palpable pedal pulses bilaterally. No edema or erythema noted. Capillary refill within normal limits.  Neurological: Epicritic and protective threshold grossly intact bilaterally.   Musculoskeletal Exam: Pain on palpation noted to the insertion of the peroneal brevis tendon into the fifth metatarsal tubercle of the left foot. Negative pain on palpation to the right foot. Range of motion within normal limits to all pedal and ankle joints bilateral. Muscle strength 5/5 in all groups bilateral.    Assessment: #1 peroneal enthesopathy right foot-resolved #2 peroneal enthesopathy left foot #3 pain in left foot   Plan of Care:  #1 Patient was evaluated. #2 injection of 0.5 mL Celestone Soluspan injected in the patient's left foot at the insertion of the peroneal brevis tendon into the fifth metatarsal tubercle #3 continue all conservative modalities #4 return to clinic in 4 weeks   Felecia ShellingBrent M. Evans, DPM [facility]  Dr. Felecia ShellingBrent M. Evans, DPM    [address]                                        [place], [place] [address]                Office [phone]) [phone]  Fax [phone]) [phone]

## 2016-05-24 ENCOUNTER — Other Ambulatory Visit: Payer: Self-pay | Admitting: Family Medicine

## 2016-05-24 NOTE — Telephone Encounter (Signed)
Last office visit 02/26/16 with Allayne GitelmanK. Clark.  Last refilled 01/20/16 for #30 with 1 refill.  Ok to refill?

## 2016-05-25 NOTE — Telephone Encounter (Signed)
Sent. Thanks.   

## 2016-05-30 ENCOUNTER — Other Ambulatory Visit: Payer: Self-pay | Admitting: Family Medicine

## 2016-05-31 NOTE — Telephone Encounter (Signed)
Please get update.  Last refilled 05/25/16 for #30.  Thanks.

## 2016-05-31 NOTE — Telephone Encounter (Signed)
Last office visit 02/26/16 with Allayne GitelmanK. Clark.  Last refilled 05/25/16 for #30 with no refills.  Refill?

## 2016-06-01 NOTE — Telephone Encounter (Signed)
Left message on voicemail for patient to call back. 

## 2016-06-02 NOTE — Telephone Encounter (Signed)
Left message on voicemail for patient to call back. 

## 2016-06-05 NOTE — Telephone Encounter (Signed)
LMOM for patient to call back.

## 2016-06-08 NOTE — Telephone Encounter (Signed)
Please send her a letter to call the clinic and update us about her nausea and promethazine use. I canceled the refill in the meantime. Thanks

## 2016-06-08 NOTE — Telephone Encounter (Signed)
Have left several messages for patient to call back. No return call from patient.

## 2016-06-09 ENCOUNTER — Encounter: Payer: Self-pay | Admitting: *Deleted

## 2016-06-09 NOTE — Telephone Encounter (Signed)
Letter mailed

## 2016-06-16 ENCOUNTER — Ambulatory Visit: Payer: BLUE CROSS/BLUE SHIELD | Admitting: Podiatry

## 2016-06-23 ENCOUNTER — Ambulatory Visit (INDEPENDENT_AMBULATORY_CARE_PROVIDER_SITE_OTHER): Payer: BLUE CROSS/BLUE SHIELD | Admitting: *Deleted

## 2016-06-23 DIAGNOSIS — E538 Deficiency of other specified B group vitamins: Secondary | ICD-10-CM

## 2016-06-23 MED ORDER — CYANOCOBALAMIN 1000 MCG/ML IJ SOLN
1000.0000 ug | Freq: Once | INTRAMUSCULAR | Status: AC
  Administered 2016-06-23: 1000 ug via INTRAMUSCULAR

## 2016-06-30 ENCOUNTER — Ambulatory Visit: Payer: BLUE CROSS/BLUE SHIELD | Admitting: Podiatry

## 2016-07-13 ENCOUNTER — Emergency Department: Payer: BLUE CROSS/BLUE SHIELD

## 2016-07-13 ENCOUNTER — Emergency Department
Admission: EM | Admit: 2016-07-13 | Discharge: 2016-07-13 | Disposition: A | Discharge status: Home / Self Care | Payer: BLUE CROSS/BLUE SHIELD | Attending: Emergency Medicine | Admitting: Emergency Medicine

## 2016-07-13 ENCOUNTER — Encounter: Payer: Self-pay | Admitting: Emergency Medicine

## 2016-07-13 DIAGNOSIS — Z79899 Other long term (current) drug therapy: Secondary | ICD-10-CM | POA: Diagnosis not present

## 2016-07-13 DIAGNOSIS — I10 Essential (primary) hypertension: Secondary | ICD-10-CM | POA: Insufficient documentation

## 2016-07-13 DIAGNOSIS — J45909 Unspecified asthma, uncomplicated: Secondary | ICD-10-CM | POA: Insufficient documentation

## 2016-07-13 DIAGNOSIS — K852 Alcohol induced acute pancreatitis without necrosis or infection: Secondary | ICD-10-CM | POA: Diagnosis not present

## 2016-07-13 DIAGNOSIS — R112 Nausea with vomiting, unspecified: Secondary | ICD-10-CM | POA: Diagnosis present

## 2016-07-13 DIAGNOSIS — J189 Pneumonia, unspecified organism: Secondary | ICD-10-CM | POA: Insufficient documentation

## 2016-07-13 LAB — LIPASE, BLOOD: Lipase: 136 U/L — ABNORMAL HIGH (ref 11–51)

## 2016-07-13 LAB — CBC
HCT: 46.9 % (ref 35.0–47.0)
HEMOGLOBIN: 15.8 g/dL (ref 12.0–16.0)
MCH: 31.8 pg (ref 26.0–34.0)
MCHC: 33.7 g/dL (ref 32.0–36.0)
MCV: 94.5 fL (ref 80.0–100.0)
Platelets: 271 10*3/uL (ref 150–440)
RBC: 4.97 MIL/uL (ref 3.80–5.20)
RDW: 13.5 % (ref 11.5–14.5)
WBC: 5.6 10*3/uL (ref 3.6–11.0)

## 2016-07-13 LAB — TROPONIN I: Troponin I: <0.03 ng/mL (ref <0.03)

## 2016-07-13 LAB — BASIC METABOLIC PANEL
Anion gap: 11 (ref 5–15)
BUN: 11 mg/dL (ref 6–20)
CHLORIDE: 101 mmol/L (ref 101–111)
CO2: 24 mmol/L (ref 22–32)
Calcium: 9.3 mg/dL (ref 8.9–10.3)
Creatinine, Ser: 0.73 mg/dL (ref 0.44–1.00)
GFR calc non Af Amer: >60 mL/min (ref >60)
Glucose, Bld: 104 mg/dL — ABNORMAL HIGH (ref 65–99)
POTASSIUM: 4 mmol/L (ref 3.5–5.1)
SODIUM: 136 mmol/L (ref 135–145)

## 2016-07-13 LAB — HEPATIC FUNCTION PANEL
ALBUMIN: 3.7 g/dL (ref 3.5–5.0)
ALT: 48 U/L (ref 14–54)
AST: 64 U/L — AB (ref 15–41)
Alkaline Phosphatase: 80 U/L (ref 38–126)
BILIRUBIN DIRECT: 0.4 mg/dL (ref 0.1–0.5)
BILIRUBIN TOTAL: 1.7 mg/dL — AB (ref 0.3–1.2)
Indirect Bilirubin: 1.3 mg/dL — ABNORMAL HIGH (ref 0.3–0.9)
Total Protein: 7.7 g/dL (ref 6.5–8.1)

## 2016-07-13 MED ORDER — ONDANSETRON HCL 4 MG PO TABS: 4.0000 mg | ORAL_TABLET | Freq: Three times a day (TID) | ORAL | 0 refills | Status: DC | PRN

## 2016-07-13 MED ORDER — FAMOTIDINE IN NACL 20-0.9 MG/50ML-% IV SOLN
INTRAVENOUS | Status: AC
  Administered 2016-07-13: 20 mg via INTRAVENOUS
  Filled 2016-07-13: qty 50

## 2016-07-13 MED ORDER — OXYCODONE-ACETAMINOPHEN 5-325 MG PO TABS: 1.0000 | ORAL_TABLET | Freq: Four times a day (QID) | ORAL | 0 refills | Status: DC | PRN

## 2016-07-13 MED ORDER — MORPHINE SULFATE (PF) 4 MG/ML IV SOLN
INTRAVENOUS | Status: AC
  Filled 2016-07-13: qty 1

## 2016-07-13 MED ORDER — ONDANSETRON HCL 4 MG/2ML IJ SOLN
4.0000 mg | Freq: Once | INTRAMUSCULAR | Status: AC
  Administered 2016-07-13: 4 mg via INTRAVENOUS

## 2016-07-13 MED ORDER — FAMOTIDINE IN NACL 20-0.9 MG/50ML-% IV SOLN
20.0000 mg | Freq: Once | INTRAVENOUS | Status: AC
  Administered 2016-07-13: 20 mg via INTRAVENOUS

## 2016-07-13 MED ORDER — AZITHROMYCIN 250 MG PO TABS: ORAL_TABLET | ORAL | 0 refills | Status: DC

## 2016-07-13 MED ORDER — GI COCKTAIL ~~LOC~~
ORAL | Status: AC
  Administered 2016-07-13: 30 mL via ORAL
  Filled 2016-07-13: qty 30

## 2016-07-13 MED ORDER — AZITHROMYCIN 250 MG PO TABS
500.0000 mg | ORAL_TABLET | Freq: Once | ORAL | Status: AC
  Administered 2016-07-13: 500 mg via ORAL
  Filled 2016-07-13: qty 2

## 2016-07-13 MED ORDER — ONDANSETRON HCL 4 MG/2ML IJ SOLN
INTRAMUSCULAR | Status: AC
  Administered 2016-07-13: 4 mg via INTRAVENOUS
  Filled 2016-07-13: qty 2

## 2016-07-13 MED ORDER — MORPHINE SULFATE (PF) 4 MG/ML IV SOLN
4.0000 mg | Freq: Once | INTRAVENOUS | Status: AC
  Administered 2016-07-13: 4 mg via INTRAVENOUS

## 2016-07-13 MED ORDER — GI COCKTAIL ~~LOC~~
30.0000 mL | Freq: Once | ORAL | Status: AC
  Administered 2016-07-13: 30 mL via ORAL

## 2016-07-13 MED ORDER — IOPAMIDOL (ISOVUE-370) INJECTION 76%
75.0000 mL | Freq: Once | INTRAVENOUS | Status: AC | PRN
  Administered 2016-07-13: 75 mL via INTRAVENOUS
  Filled 2016-07-13: qty 75

## 2016-07-13 NOTE — Discharge Instructions (Signed)
Advance your diet slowly every 24 hours. Start with blend liquid diet. Avoid alcohol or NSAIDS. Take zofran and percocet as needed for pain. Follow up with your doctor in 2 days. Return to the ER for worsening pain, inability to keep anything down, fever, or any new symptoms concerning to you.

## 2016-07-13 NOTE — ED Provider Notes (Signed)
[facility] Emergency Department Provider Note  ____________________________________________  Time seen: Approximately 3:01 PM  I have reviewed the triage vital signs and the nursing notes.   HISTORY  Chief Complaint Chest Pain   HPI Abigail [name] is a 56 y.o. female has a history of alcohol abuse, cholecystitis status post cholecystectomy, GERD, gastritis, esophagitis, pancreatitis, hypertension who presents for evaluation of chest pain.Patient reports 2 days of central chest pain that she describes as a sensation of a balloon inflating in the middle of her chest radiating around her back, constant, and moderate in intensity. The pain is associated with multiple episodes of nonbloody nonbilious emesis and nausea. The pain is not pleuritic in nature. Patient reports that over the weekend she drank a lot of alcohol and she was at a wedding. No vomiting or diarrhea, no shortness of breath or cough, no fever or chills, no dysuria hematuria, no diarrhea or constipation. Patient has been taking Protonix twice a day with no improvement of her pain. Patient is not a smoker. She has family history of ischemic heart disease. No family history of PE or DVT, pain is not pleuritic in nature, no recent travel or immobilization, no leg pain or swelling, no hemoptysis, no exogenous hormones.  Past Medical History:  Diagnosis Date  . Alcoholism (HCC)   . Cholecystitis   . GERD (gastroesophageal reflux disease)   . Hypertension   . Pancreatitis 10/2015   2013 alcohol and/or gallstone  . Photosensitivity    (No formal dx of lupus) prev eval Dr. Gavin Potters in [facility]    Patient Active Problem List   Diagnosis Date Noted  . Irritability 11/04/2015  . Vomiting without nausea, intractable 10/26/2015  . Acute pancreatitis 10/26/2015  . Other migraine without status migrainosus, not intractable 09/17/2015  . Breast lesion 04/18/2015  . Abdominal pain, epigastric 01/01/2015    . Sciatica 10/10/2014  . At high risk for caregiver role strain 06/05/2014  . Routine general medical examination at a health care facility 06/05/2014  . Advance care planning 06/05/2014  . Pain in joint, shoulder region 07/19/2013  . History of alcohol abuse 09/13/2012  . Cholecystitis with cholelithiasis 07/01/2012  . Biliary colic 04/19/2012  . Anxiety 04/10/2012  . Pulmonary nodules 04/10/2012  . Gastritis 03/17/2012  . Pancreatitis, acute 03/13/2012  . Transaminitis 05/17/2011  . Other screening mammogram 05/17/2011  . B12 deficiency 01/31/2010  . ARTHRALGIA 01/31/2010  . Essential hypertension 08/16/2006  . ASTHMA, EXTRINSIC NOS 08/16/2006  . ESOPHAGITIS 08/16/2006  . GERD 08/16/2006    Past Surgical History:  Procedure Laterality Date  . APPENDECTOMY     As a child  . CAT scan  11/00   wnl  . CHOLECYSTECTOMY  04/20/2012   Procedure: LAPAROSCOPIC CHOLECYSTECTOMY WITH INTRAOPERATIVE CHOLANGIOGRAM;  Surgeon: Robyne Askew, MD;  Location: [facility] OR;  Service: General;  Laterality: N/A;  . DOBUTAMINE STRESS ECHO  11/00   wnl  . DOPPLER ECHOCARDIOGRAPHY     Secondary to Redux, wnl  . ESOPHAGOGASTRODUODENOSCOPY  8/01   Esophagitis [name])  . FOOT SURGERY  04/2015   Pin in small toe  . 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 NSVD     X 2  . SBFT  08/01   wnl  . TONSILLECTOMY     As a child  . TUBAL LIGATION  Late 20's    Prior to Admission medications   Medication Sig Start Date End Date Taking? Authorizing Provider  azithromycin [facility])  250 MG tablet Take 1 a day for 4 days 07/13/16   Nita Sickle, MD  cyanocobalamin (,VITAMIN B-12,) 1000 MCG/ML injection Inject 1,000 mcg into the muscle every 30 (thirty) days.    Historical Provider, MD  gabapentin (NEURONTIN) 300 MG capsule Take 1-2 capsules (300-600 mg total) by mouth 2 (two) times daily. 03/19/16   Suanne Marker, MD  metoprolol (LOPRESSOR) 50 MG tablet Take 1 tablet (50 mg total) by  mouth 2 (two) times daily. 08/27/15   Joaquim Nam, MD  NONFORMULARY OR COMPOUNDED ITEM Apply 1-2 g topically 4 (four) times daily. 02/11/16   Felecia Shelling, DPM  ondansetron (ZOFRAN) 4 MG tablet Take 1 tablet (4 mg total) by mouth every 8 (eight) hours as needed for nausea or vomiting. 07/13/16   Nita Sickle, MD  oxyCODONE-acetaminophen (ROXICET) 5-325 MG tablet Take 1 tablet by mouth every 6 (six) hours as needed. 07/13/16 07/13/17  Nita Sickle, MD  pantoprazole (PROTONIX) 40 MG tablet Take 1 tablet (40 mg total) by mouth daily. 08/27/15   Joaquim Nam, MD  promethazine (PHENERGAN) 25 MG tablet TAKE 1 TABLET (25 MG TOTAL) BY MOUTH EVERY 6 (SIX) HOURS AS NEEDED FOR NAUSEA OR VOMITING. 05/25/16   Joaquim Nam, MD    Allergies Latex; Atorvastatin; Dilaudid [hydromorphone hcl]; Lexapro [escitalopram oxalate]; Lisinopril; Mirtazapine; Nsaids; and Tylenol [acetaminophen]  Family History  Problem Relation Age of Onset  . Heart disease Mother     CAD, angioplasty twice with stents  . Cancer Mother     Breast  . Breast cancer Mother 70  . Hypertension Mother   . Hypertension Father   . Hypothyroidism Father   . Heart disease Father     MI at age 40  . Cancer Father     H/O renal CA  . Diabetes Sister   . Cancer Sister     Renal CA  . Colon cancer Neg Hx     Social History Social History  Substance Use Topics  . Smoking status: Never Smoker  . Smokeless tobacco: Never Used  . Alcohol use Yes     Comment: rare as of 2016, 10/02/15 3-4 glasses wine weekly    Review of Systems  Constitutional: Negative for fever. Eyes: Negative for visual changes. ENT: Negative for sore throat. Neck: No neck pain  Cardiovascular: + chest pain. Respiratory: Negative for shortness of breath. Gastrointestinal: Negative for abdominal pain, diarrhea. + N/V Genitourinary: Negative for dysuria. Musculoskeletal: Negative for back pain. Skin: Negative for rash. Neurological: Negative for  headaches, weakness or numbness. Psych: No SI or HI  ____________________________________________   PHYSICAL EXAM:  VITAL SIGNS: ED Triage Vitals  Enc Vitals Group     BP 07/13/16 1405 (!) 182/95     Pulse Rate 07/13/16 1118 75     Resp 07/13/16 1118 16     Temp 07/13/16 1118 97.7 F (36.5 C)     Temp Source 07/13/16 1118 Oral     SpO2 07/13/16 1118 98 %     Weight 07/13/16 1119 195 lb (88.5 kg)     Height 07/13/16 1119  (1.549 m)     Head Circumference --      Peak Flow --      Pain Score 07/13/16 1118 8     Pain Loc --      Pain Edu? --      Excl. in GC? --     Constitutional: Alert and oriented. Well appearing and in no  apparent distress. HEENT:      Head: Normocephalic and atraumatic.         Eyes: Conjunctivae are normal. Sclera is non-icteric. EOMI. PERRL      Mouth/Throat: Mucous membranes are moist.       Neck: Supple with no signs of meningismus. Cardiovascular: Regular rate and rhythm. No murmurs, gallops, or rubs. 2+ symmetrical distal pulses are present in all extremities. No JVD. ttp over the lower central chest area Respiratory: Normal respiratory effort. Lungs are clear to auscultation bilaterally. No wheezes, crackles, or rhonchi.  Gastrointestinal: Soft, non tender, and non distended with positive bowel sounds. No rebound or guarding. Genitourinary: No CVA tenderness. Musculoskeletal: Nontender with normal range of motion in all extremities. No edema, cyanosis, or erythema of extremities. Neurologic: Normal speech and language. Face is symmetric. Moving all extremities. No gross focal neurologic deficits are appreciated. Skin: Skin is warm, dry and intact. No rash noted. Psychiatric: Mood and affect are normal. Speech and behavior are normal.  ____________________________________________   LABS (all labs ordered are listed, but only abnormal results are displayed)  Labs Reviewed  BASIC METABOLIC PANEL - Abnormal; Notable for the following:        Result Value   Glucose, Bld 104 (*)    All other components within normal limits  HEPATIC FUNCTION PANEL - Abnormal; Notable for the following:    AST 64 (*)    Total Bilirubin 1.7 (*)    Indirect Bilirubin 1.3 (*)    All other components within normal limits  LIPASE, BLOOD - Abnormal; Notable for the following:    Lipase 136 (*)    All other components within normal limits  CBC  TROPONIN I  TROPONIN I   ____________________________________________  EKG  ED ECG REPORT I, Nita Sickle, the attending physician, personally viewed and interpreted this ECG. Normal sinus rhythm, rate of 66, normal intervals, normal axis, no ST elevations or depressions, diffuse ST-T wave abnormalities on the inferior anterior and lateral leads which are nonspecific. No significant changes when compared to prior from 2017. ____________________________________________  RADIOLOGY  CTA chest: 1. No thoracic aortic dissection or aneurysm. 2. Patchy bilateral lung opacities compatible with pneumonia. 3. Hepatic steatosis.  ____________________________________________   PROCEDURES  Procedure(s) performed: None Procedures Critical Care performed:  None ____________________________________________   INITIAL IMPRESSION / ASSESSMENT AND PLAN / ED COURSE   56 y.o. female has a history of alcohol abuse, cholecystitis status post cholecystectomy, GERD, gastritis, esophagitis, pancreatitis, hypertension who presents for evaluation of chest pain constant for two days radiating around her back and associated with nausea and vomiting. Patient is well-appearing, in no distress, she is hypertensive with BP of 182/95, remaining of her blood work is within normal limits. Her abdomen is soft with no tenderness throughout, she does have mild tenderness over her chest wall. Differential diagnoses including gastritis versus GERD versus pancreatitis versus esophagitis specially in the setting of recent alcohol binge  versus dissection. Low suspicion for PE with no hypoxia, no tachycardia, no pleuritic component of the pain, no risk factors. EKG with no evidence of ischemia in this patient with atypical CP. Will cycle cardiac enzymes. Labs pending. Will pursue CTA chest to eval for dissection. Will give GI cocktail, pepcid, antiemetic and reassess.   Clinical Course as of Jul 13 1604  Mon Jul 13, 2016  1522 CT with no evidence of dissection or PE but showing PNA. Lipase is elevated at 136 and AST is also elevated at 64 concerning for alcohol-induced  pancreatitis. We'll give pain medication and by mouth challenge and azithromycin.  [CV]  1602 Pain well controlled and patient tolerating PO. Will dc home with z-pack, zofran, percocet, bland diet and f/u with PCP  [CV]    Clinical Course User Index [CV] Nita Sickle, MD    Pertinent labs & imaging results that were available during my care of the patient were reviewed by me and considered in my medical decision making (see chart for details).    ____________________________________________   FINAL CLINICAL IMPRESSION(S) / ED DIAGNOSES  Final diagnoses:  Alcohol-induced acute pancreatitis, unspecified complication status  Community acquired pneumonia, unspecified laterality      NEW MEDICATIONS STARTED DURING THIS VISIT:  New Prescriptions   AZITHROMYCIN (ZITHROMAX) 250 MG TABLET    Take 1 a day for 4 days   ONDANSETRON (ZOFRAN) 4 MG TABLET    Take 1 tablet (4 mg total) by mouth every 8 (eight) hours as needed for nausea or vomiting.   OXYCODONE-ACETAMINOPHEN (ROXICET) 5-325 MG TABLET    Take 1 tablet by mouth every 6 (six) hours as needed.     Note:  This document was prepared using Dragon voice recognition software and may include unintentional dictation errors.    Nita Sickle, MD 07/13/16 1606

## 2016-07-13 NOTE — ED Notes (Signed)
Patient given water for PO challenge per MD order.

## 2016-07-13 NOTE — ED Triage Notes (Signed)
Chest pain for 2 days.  Fullness.

## 2016-07-17 ENCOUNTER — Encounter: Payer: Self-pay | Admitting: Family Medicine

## 2016-07-17 ENCOUNTER — Ambulatory Visit (INDEPENDENT_AMBULATORY_CARE_PROVIDER_SITE_OTHER): Payer: BLUE CROSS/BLUE SHIELD | Admitting: Family Medicine

## 2016-07-17 ENCOUNTER — Ambulatory Visit
Admission: RE | Admit: 2016-07-17 | Discharge: 2016-07-17 | Disposition: A | Discharge status: Home / Self Care | Payer: BLUE CROSS/BLUE SHIELD | Source: Ambulatory Visit | Attending: Family Medicine | Admitting: Family Medicine

## 2016-07-17 VITALS — BP 140/90 | HR 59 | Temp 98.6°F

## 2016-07-17 DIAGNOSIS — J189 Pneumonia, unspecified organism: Secondary | ICD-10-CM | POA: Diagnosis not present

## 2016-07-17 DIAGNOSIS — K859 Acute pancreatitis without necrosis or infection, unspecified: Secondary | ICD-10-CM | POA: Diagnosis not present

## 2016-07-17 DIAGNOSIS — K219 Gastro-esophageal reflux disease without esophagitis: Secondary | ICD-10-CM

## 2016-07-17 MED ORDER — ONDANSETRON HCL 4 MG PO TABS: 4.0000 mg | ORAL_TABLET | Freq: Three times a day (TID) | ORAL | 1 refills | Status: DC | PRN

## 2016-07-17 MED ORDER — OMEPRAZOLE 40 MG PO CPDR: 40.0000 mg | DELAYED_RELEASE_CAPSULE | Freq: Every day | ORAL | 3 refills | Status: DC

## 2016-07-17 MED ORDER — OXYCODONE-ACETAMINOPHEN 5-325 MG PO TABS: 1.0000 | ORAL_TABLET | Freq: Four times a day (QID) | ORAL | 0 refills | Status: DC | PRN

## 2016-07-17 NOTE — Progress Notes (Signed)
Done with zmax as of today.  Some cough, but better.  Still some difficultly with a deep breath.    zofran helped with nausea more than phenergan.    CTA chest d/w pt: 1. No thoracic aortic dissection or aneurysm. 2. Patchy bilateral lung opacities compatible with pneumonia. 3. Hepatic steatosis.   D/w pt about etoh and the recent wedding reception.  My concern is that she can't drink any etoh, safely.  Discussed with patient. She understood.  Still with some pain in the back between the shoulder blades.  Heat helps some.  Pain is better than prior to the ER.    No fevers.  No vomiting since the ER.    She thought the GERD sx were not as well controlled with protonix.  She had been taking more tums over the last few weeks. Discussed with patient.  PMH and SH reviewed  ROS: Per HPI unless specifically indicated in ROS section   Meds, vitals, and allergies reviewed.   nad ncat Mmm Neck supple, no LA ctab abd soft, not ttp Back ttp between the scapula Ext w/o edema, well-perfused.

## 2016-07-17 NOTE — Patient Instructions (Addendum)
Avoid all alcohol. Recheck CXR in about 6 weeks.  You don't need an appointment here.  Change to prilosec, use zofran if needed.  Take care.  Glad to see you.  Update me if worse in the meantime.

## 2016-07-17 NOTE — Progress Notes (Signed)
Pre visit review using our clinic review tool, if applicable. No additional management support is needed unless otherwise documented below in the visit note. 

## 2016-07-19 DIAGNOSIS — J189 Pneumonia, unspecified organism: Secondary | ICD-10-CM | POA: Insufficient documentation

## 2016-07-19 NOTE — Assessment & Plan Note (Signed)
Okay to try Prilosec. Discussed with patient about avoiding alcohol.

## 2016-07-19 NOTE — Assessment & Plan Note (Signed)
Discussed with patient about follow-up x-ray in about 6 weeks. Okay for outpatient follow-up. Lungs are clear. She looks clinically improved. No need to extend antibiotics at this point. Update me as needed.

## 2016-07-19 NOTE — Assessment & Plan Note (Signed)
No acute symptoms. Needs full cessation of alcohol. Discussed with patient. I don't think it is advisable for her to ever drink alcohol again. Use Zofran if needed. Update [place] as needed. We talked about options for alcohol cessation and avoidance. Okay for outpatient follow-up.>25 minutes spent in face to face time with patient, >50% spent in counselling or coordination of care.

## 2016-07-22 ENCOUNTER — Other Ambulatory Visit: Payer: Self-pay | Admitting: Family Medicine

## 2016-07-22 ENCOUNTER — Ambulatory Visit
Admission: RE | Admit: 2016-07-22 | Discharge: 2016-07-22 | Disposition: A | Discharge status: Home / Self Care | Payer: BLUE CROSS/BLUE SHIELD | Source: Ambulatory Visit | Attending: Family Medicine | Admitting: Family Medicine

## 2016-07-22 DIAGNOSIS — J189 Pneumonia, unspecified organism: Secondary | ICD-10-CM

## 2016-07-28 ENCOUNTER — Ambulatory Visit (INDEPENDENT_AMBULATORY_CARE_PROVIDER_SITE_OTHER): Payer: BLUE CROSS/BLUE SHIELD | Admitting: *Deleted

## 2016-07-28 DIAGNOSIS — E538 Deficiency of other specified B group vitamins: Secondary | ICD-10-CM | POA: Diagnosis not present

## 2016-07-28 MED ORDER — CYANOCOBALAMIN 1000 MCG/ML IJ SOLN
1000.0000 ug | Freq: Once | INTRAMUSCULAR | Status: AC
  Administered 2016-07-28: 1000 ug via INTRAMUSCULAR

## 2016-07-29 ENCOUNTER — Telehealth: Payer: Self-pay | Admitting: Family Medicine

## 2016-07-29 NOTE — Telephone Encounter (Signed)
I called pt to see how she is feeling now and she still states she is OK, she thinks she may have gotten dehydrated; pt is drinking liquids and resting; pt still declines to make appt at [facility]. Pt will cb if needed per pt. FYI to Dr Para March and Mayra Reel NP.

## 2016-07-29 NOTE — Telephone Encounter (Signed)
Thanks. I will defer to patient at this point. I don't think any other intervention is needed at this point.

## 2016-07-29 NOTE — Telephone Encounter (Signed)
Patient Name: Abigail [name]  DOB: 12/24/1960    Initial Comment Caller states she got a B-12 shot yesterday, she has pneumonia. She now is having new confusion.   Nurse Assessment      Guidelines    Guideline Title Affirmed Question Affirmed Notes       Final Disposition User        Comments  caller disconnected from urg queue. I attempted immediate cb, obtained vm  2nd attempt to reach caller, obtained vm.  3rd attempt to reach caller, obtained vm.   Reason: Called back line, spoke to [place], transfer to [place]. Information provided that I can not get caller back on the phone, she ask that I send the note and she will FU with caller. Marland Kitchen

## 2016-07-29 NOTE — Telephone Encounter (Signed)
Noted and agree. 

## 2016-07-29 NOTE — Telephone Encounter (Signed)
There have been multiple calls to check on her.  She declined EMS and f/u here.  Police has checked on patient.  I don't see what else can be done.  I wouldn't change her meds w/o eval first.   I am open to any suggestions at this point; I appreciate the help of all involved.

## 2016-07-29 NOTE — Telephone Encounter (Signed)
After speaking with TH I called pt; she did not answer the first time; second time she answered and when I identified myself pt hung up; I called back and pt answered the phone; pt said she was just tired; then she said she was dizzy and did have confusion (new symptom) earlier. Pt denies H/A,CP or SOB or fever. Pt said she has had pneumonia and got a B 12 shot yesterday. Then pt repeatedly said she is fine. I advised her respectfully that she is not exactly fine or they would not have sent her home from work. Pt refuses to go to UC or ED and does not want to make appt at [facility]. I asked if anyone was with pt and she said yes, Kendal Hymen. I asked to speak with Kendal Hymen and she said Kendal Hymen was not there. Ernie was there, I asked to speak with Ernie and she said no he was outside. I asked pt to hold so I could speak with a provider and she agreed. I spoke with Lyla Son at front desk and she said on the initial call that pt called in and was dizzy and before Lyla Son transferred to [facility] Ernie got on the phone and said pt was incoherent. I tired to call pts employer # to get more info but phone # in chart is not correct work #.I tried Ernies # as ER contact but no answer. I spoke with Mayra Reel NP and she advised from the info I gave her to have a police car go out to ck on pt. When I went back to phone, Anyjah had hung up. I called pt, no answer x 2 and the third time pt answered the phone and said she was OK. I advised that if I could not speak to Ernie I would need to have a police officer come by and talk with her; pt said that was fine. I called [place] [employer] spoke with Velna Hatchet; gave hx and she will have officer ck on pt. Officer Darnelle Bos called me and reported when he first started talking with pt she"seemed out of it" but as he talked with her she answered questions appropriately. Pt sounded awful but advised she recently had pneumonia. Officer Darnelle Bos asked pt multiple times if he could call EMS to ck pt out and pt said No.Officer  Darnelle Bos said anything further he could do to let him know. FYI to Dr Para March and Mayra Reel NP.

## 2016-09-01 ENCOUNTER — Ambulatory Visit (INDEPENDENT_AMBULATORY_CARE_PROVIDER_SITE_OTHER): Payer: BLUE CROSS/BLUE SHIELD

## 2016-09-01 DIAGNOSIS — E538 Deficiency of other specified B group vitamins: Secondary | ICD-10-CM | POA: Diagnosis not present

## 2016-09-01 MED ORDER — CYANOCOBALAMIN 1000 MCG/ML IJ SOLN
1000.0000 ug | Freq: Once | INTRAMUSCULAR | Status: AC
  Administered 2016-09-01: 1000 ug via INTRAMUSCULAR

## 2016-09-01 NOTE — Progress Notes (Signed)
Pt received a b12 injection.

## 2016-09-22 ENCOUNTER — Ambulatory Visit: Payer: Self-pay | Admitting: Diagnostic Neuroimaging

## 2016-09-27 ENCOUNTER — Other Ambulatory Visit: Payer: Self-pay | Admitting: Family Medicine

## 2016-09-28 ENCOUNTER — Ambulatory Visit (INDEPENDENT_AMBULATORY_CARE_PROVIDER_SITE_OTHER): Payer: BLUE CROSS/BLUE SHIELD | Admitting: Internal Medicine

## 2016-09-28 ENCOUNTER — Encounter: Payer: Self-pay | Admitting: Internal Medicine

## 2016-09-28 VITALS — BP 110/70 | HR 76 | Temp 98.0°F | Wt 202.0 lb

## 2016-09-28 DIAGNOSIS — M542 Cervicalgia: Secondary | ICD-10-CM

## 2016-09-28 NOTE — Assessment & Plan Note (Signed)
No embedded tick (history suggests flying insect bite) Seems to be muscular Reassured--nothing to point to [facility] Discussed--if she gets fever, etc---let me know

## 2016-09-28 NOTE — Progress Notes (Signed)
Subjective:    Patient ID: Abigail [name], female    DOB: [birth year], 56 y.o.   MRN: [id number]  HPI Here due to symptoms since apparent bite  Felt something on her right neck 4 days ago Itchy but no clear bite--- scratched it Awoke 2 days later with stiff neck--which has persisted (right on prominence of C-spine) Has been visiting friends--not sleeping in her bed No fever Some headache--on the top of head. Has tried ibuprofen-didn't help  Works for [employer]--lots of time outside Not sure if she could have had any ticks from the day before  Current Outpatient Prescriptions on File Prior to Visit  Medication Sig Dispense Refill  . cyanocobalamin (,VITAMIN B-12,) 1000 MCG/ML injection Inject 1,000 mcg into the muscle every 30 (thirty) days.    Marland Kitchen gabapentin (NEURONTIN) 300 MG capsule Take 1-2 capsules (300-600 mg total) by mouth 2 (two) times daily. 120 capsule 12  . metoprolol tartrate (LOPRESSOR) 50 MG tablet TAKE 1 TABLET (50 MG TOTAL) BY MOUTH 2 (TWO) TIMES DAILY. 180 tablet 2  . ondansetron (ZOFRAN) 4 MG tablet Take 1 tablet (4 mg total) by mouth every 8 (eight) hours as needed for nausea or vomiting. 30 tablet 1   Current Facility-Administered Medications on File Prior to Visit  Medication Dose Route Frequency Provider Last Rate Last Dose  . betamethasone acetate-betamethasone sodium phosphate (CELESTONE) injection 3 mg  3 mg Intramuscular Once Gala Lewandowsky M, DPM      . betamethasone acetate-betamethasone sodium phosphate (CELESTONE) injection 3 mg  3 mg Intramuscular Once Gala Lewandowsky M, DPM      . betamethasone acetate-betamethasone sodium phosphate (CELESTONE) injection 3 mg  3 mg Intramuscular Once Gala Lewandowsky M, DPM      . betamethasone acetate-betamethasone sodium phosphate (CELESTONE) injection 3 mg  3 mg Intramuscular Once Felecia Shelling, DPM        Allergies  Allergen Reactions  . Latex Rash    "hands turn beat red and start to itch"  . Atorvastatin Other (See  Comments)    Swollen joints  . Dilaudid [Hydromorphone Hcl] Itching and Other (See Comments)    Itching- no rash- likely side effect but not true allergy.  Tolerates oxycodone.   [name] [Escitalopram Oxalate] Other (See Comments)    Psychological changes  . Lisinopril Other (See Comments)    Hives, presumed allergy  . Mirtazapine Swelling    swelling  . Nsaids     Gastritis 2013  . Tylenol [Acetaminophen] Other (See Comments)    Hx pancreatitis    Past Medical History:  Diagnosis Date  . Alcoholism (HCC)   . Cholecystitis   . GERD (gastroesophageal reflux disease)   . Hypertension   . Pancreatitis 10/2015   2013 alcohol and/or gallstone  . Photosensitivity    (No formal dx of lupus) prev eval Dr. Gavin [facility]ters in [facility]    Past Surgical History:  Procedure Laterality Date  . APPENDECTOMY     As a child  . CAT scan  11/00   wnl  . CHOLECYSTECTOMY  04/20/2012   Procedure: LAPAROSCOPIC CHOLECYSTECTOMY WITH INTRAOPERATIVE CHOLANGIOGRAM;  Surgeon: Robyne Askew, MD;  Location: [facility] OR;  Service: General;  Laterality: N/A;  . DOBUTAMINE STRESS ECHO  11/00   wnl  . DOPPLER ECHOCARDIOGRAPHY     Secondary to Redux, wnl  . ESOPHAGOGASTRODUODENOSCOPY  8/01   Esophagitis [name])  . FOOT SURGERY  04/2015   Pin in small toe  . Laparoscopic roux-en-Y,  gastric bypass  12/05/03  . NSVD     X 2  . SBFT  08/01   wnl  . TONSILLECTOMY     As a child  . TUBAL LIGATION  Late 20's    Family History  Problem Relation Age of Onset  . Heart disease Mother        CAD, angioplasty twice with stents  . Cancer Mother        Breast  . Breast cancer Mother [id number]  . Hypertension Mother   . Hypertension Father   . Hypothyroidism Father   . Heart disease Father        MI at age 54  . Cancer Father        H/O renal CA  . Diabetes Sister   . Cancer Sister        Renal CA  . Colon cancer Neg Hx     Social History   Social History  . Marital status: Divorced    Spouse name: N/A    . Number of children: 2  . Years of education: 14   Occupational History  . 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 Smoking status: Never Smoker  . Smokeless tobacco: Never Used  . Alcohol use Yes     Comment: rare as of 2016, 10/02/15 3-4 glasses wine weekly  . Drug use: No  . Sexual activity: Not on file   Other Topics Concern  . Not on file   Social History Narrative   Divorced, 2 grown children, lives with companion   Dental Hygienist   Caffeine use- coffee in morning, Diet DP all day   Review of Systems No vision change No N/V No rash    Objective:   Physical Exam  Neck: Normal range of motion. Neck supple.  Some pain in lower neck with full extension  Skin: No rash noted.  Has slightly red non palpable spot behind right SCM (mastoid portion) on neck No other rash          Assessment & Plan:

## 2016-10-06 ENCOUNTER — Ambulatory Visit (INDEPENDENT_AMBULATORY_CARE_PROVIDER_SITE_OTHER): Payer: BLUE CROSS/BLUE SHIELD

## 2016-10-06 DIAGNOSIS — E538 Deficiency of other specified B group vitamins: Secondary | ICD-10-CM | POA: Diagnosis not present

## 2016-10-06 MED ORDER — CYANOCOBALAMIN 1000 MCG/ML IJ SOLN
1000.0000 ug | Freq: Once | INTRAMUSCULAR | Status: AC
  Administered 2016-10-06: 1000 ug via INTRAMUSCULAR

## 2016-11-10 ENCOUNTER — Ambulatory Visit (INDEPENDENT_AMBULATORY_CARE_PROVIDER_SITE_OTHER): Payer: BLUE CROSS/BLUE SHIELD | Admitting: *Deleted

## 2016-11-10 DIAGNOSIS — E538 Deficiency of other specified B group vitamins: Secondary | ICD-10-CM | POA: Diagnosis not present

## 2016-11-10 MED ORDER — CYANOCOBALAMIN 1000 MCG/ML IJ SOLN
1000.0000 ug | Freq: Once | INTRAMUSCULAR | Status: AC
  Administered 2016-11-10: 1000 ug via INTRAMUSCULAR

## 2016-11-11 ENCOUNTER — Other Ambulatory Visit: Payer: Self-pay | Admitting: Family Medicine

## 2016-11-24 ENCOUNTER — Encounter: Payer: Self-pay | Admitting: Diagnostic Neuroimaging

## 2016-11-24 ENCOUNTER — Ambulatory Visit (INDEPENDENT_AMBULATORY_CARE_PROVIDER_SITE_OTHER): Payer: BLUE CROSS/BLUE SHIELD | Admitting: Diagnostic Neuroimaging

## 2016-11-24 VITALS — BP 149/88 | HR 61 | Wt 211.6 lb

## 2016-11-24 DIAGNOSIS — B0221 Postherpetic geniculate ganglionitis: Secondary | ICD-10-CM | POA: Diagnosis not present

## 2016-11-24 MED ORDER — GABAPENTIN 300 MG PO CAPS: 300.0000 mg | ORAL_CAPSULE | Freq: Two times a day (BID) | ORAL | 12 refills | Status: DC
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
## 2016-12-15 ENCOUNTER — Ambulatory Visit: Payer: BLUE CROSS/BLUE SHIELD

## 2016-12-17 ENCOUNTER — Telehealth: Payer: Self-pay | Admitting: Family Medicine

## 2016-12-17 ENCOUNTER — Ambulatory Visit (INDEPENDENT_AMBULATORY_CARE_PROVIDER_SITE_OTHER): Payer: BLUE CROSS/BLUE SHIELD

## 2016-12-17 ENCOUNTER — Other Ambulatory Visit (INDEPENDENT_AMBULATORY_CARE_PROVIDER_SITE_OTHER): Payer: BLUE CROSS/BLUE SHIELD

## 2016-12-17 DIAGNOSIS — E538 Deficiency of other specified B group vitamins: Secondary | ICD-10-CM

## 2016-12-17 DIAGNOSIS — Z119 Encounter for screening for infectious and parasitic diseases, unspecified: Secondary | ICD-10-CM

## 2016-12-17 DIAGNOSIS — I10 Essential (primary) hypertension: Secondary | ICD-10-CM | POA: Diagnosis not present

## 2016-12-17 MED ORDER — CYANOCOBALAMIN 1000 MCG/ML IJ SOLN
1000.0000 ug | Freq: Once | INTRAMUSCULAR | Status: AC
  Administered 2016-12-17: 1000 ug via INTRAMUSCULAR

## 2016-12-17 NOTE — Telephone Encounter (Signed)
Prev ordered.  Thanks.

## 2016-12-17 NOTE — Telephone Encounter (Signed)
Patient is coming in for lab work today at 4:00.  Patient is asking for the Hep C test to be added to the orders.

## 2016-12-18 LAB — LIPID PANEL
Cholesterol: 165 mg/dL (ref 0–200)
HDL: 87 mg/dL (ref >39.00)
LDL Cholesterol: 61 mg/dL (ref 0–99)
NONHDL: 77.97
TRIGLYCERIDES: 83 mg/dL (ref 0.0–149.0)
Total CHOL/HDL Ratio: 2
VLDL: 16.6 mg/dL (ref 0.0–40.0)

## 2016-12-18 LAB — COMPREHENSIVE METABOLIC PANEL
ALK PHOS: 66 U/L (ref 39–117)
ALT: 25 U/L (ref 0–35)
AST: 30 U/L (ref 0–37)
Albumin: 3.8 g/dL (ref 3.5–5.2)
BILIRUBIN TOTAL: 0.8 mg/dL (ref 0.2–1.2)
BUN: 10 mg/dL (ref 6–23)
CALCIUM: 9.6 mg/dL (ref 8.4–10.5)
CO2: 29 mEq/L (ref 19–32)
CREATININE: 0.69 mg/dL (ref 0.40–1.20)
Chloride: 103 mEq/L (ref 96–112)
GFR: 94.3 mL/min (ref >60.00)
Glucose, Bld: 79 mg/dL (ref 70–99)
Potassium: 4.5 mEq/L (ref 3.5–5.1)
Sodium: 140 mEq/L (ref 135–145)
TOTAL PROTEIN: 7.2 g/dL (ref 6.0–8.3)

## 2016-12-18 LAB — HEPATITIS C ANTIBODY
Hepatitis C Ab: NONREACTIVE
SIGNAL TO CUT-OFF: 0.04 (ref <1.00)

## 2016-12-18 LAB — VITAMIN B12: VITAMIN B 12: 695 pg/mL (ref 211–911)

## 2016-12-25 ENCOUNTER — Ambulatory Visit (INDEPENDENT_AMBULATORY_CARE_PROVIDER_SITE_OTHER): Payer: BLUE CROSS/BLUE SHIELD | Admitting: Family Medicine

## 2016-12-25 ENCOUNTER — Encounter: Payer: Self-pay | Admitting: Family Medicine

## 2016-12-25 VITALS — BP 130/90 | HR 66 | Temp 98.8°F | Ht 61.0 in | Wt 206.0 lb

## 2016-12-25 DIAGNOSIS — I10 Essential (primary) hypertension: Secondary | ICD-10-CM

## 2016-12-25 DIAGNOSIS — Z1211 Encounter for screening for malignant neoplasm of colon: Secondary | ICD-10-CM

## 2016-12-25 DIAGNOSIS — Z0000 Encounter for general adult medical examination without abnormal findings: Secondary | ICD-10-CM

## 2016-12-25 DIAGNOSIS — Z23 Encounter for immunization: Secondary | ICD-10-CM | POA: Diagnosis not present

## 2016-12-25 DIAGNOSIS — Z8719 Personal history of other diseases of the digestive system: Secondary | ICD-10-CM | POA: Diagnosis not present

## 2016-12-25 DIAGNOSIS — E538 Deficiency of other specified B group vitamins: Secondary | ICD-10-CM | POA: Diagnosis not present

## 2016-12-25 DIAGNOSIS — Z7189 Other specified counseling: Secondary | ICD-10-CM

## 2016-12-25 MED ORDER — PANTOPRAZOLE SODIUM 40 MG PO TBEC: 40.0000 mg | DELAYED_RELEASE_TABLET | Freq: Every day | ORAL | 3 refills | Status: DC

## 2016-12-25 MED ORDER — ONDANSETRON HCL 4 MG PO TABS: 4.0000 mg | ORAL_TABLET | Freq: Three times a day (TID) | ORAL | 2 refills | Status: DC | PRN

## 2016-12-25 NOTE — Patient Instructions (Addendum)
Check with your insurance to see if they will cover the shingrix shot. B12 shot every 2 weeks for 3 months and then recheck B12 level.  Go to the lab on the way out.  We'll contact you with your lab report (stool cards). Call about a mammogram.   Take care.  Glad to see you.

## 2016-12-25 NOTE — Progress Notes (Signed)
CPE- See plan.  Routine anticipatory guidance given to patient.  See health maintenance.  The possibility exists that previously documented standard health maintenance information may have been brought forward from a previous encounter into this note.  If needed, that same information has been updated to reflect the current situation based on today's encounter.    Tetanus 2016 Flu today, 2018.  PNA during hospitalization 2014 Shingles shot d/w pt.  See avs.  D/w patient [id number] for colon cancer screening, including IFOB vs. colonoscopy.  Risks and benefits of both were discussed and patient voiced understanding.  Pt elects for: IFOB.   Breast cancer screening.   Due.  D/w pt.  She got a letter from the imaging site.  D/w pt.   DXA not due.   Living will d/w pt.  Would have her daughter designated if patient were incapacitated.  Pap up to date.   HIV screening 2006.    Hypertension:    Using medication without problems or lightheadedness: yes Chest pain with exertion:no Edema:no Short of breath:no  She isn't drinking. LFTs are normal.  D/w pt.  encouraged alcohol abstinence.  S/p gastric bypass with B12 tx.  B12 normal.  She feels well on the IM treatment but her level isn't high and she clearly is fatigued the last ~10 days prior to an injection.  We talked about B12 injection 2 weeks for a few months and recheck labs.  GERD/nausea sx usually about once per week and better with  (occ ) of zofran.    PMH and SH reviewed  Meds, vitals, and allergies reviewed.   ROS: Per HPI.  Unless specifically indicated otherwise in HPI, the patient denies:  General: fever. Eyes: acute vision changes ENT: sore throat Cardiovascular: chest pain Respiratory: SOB GI: vomiting GU: dysuria Musculoskeletal: acute back pain Derm: acute rash Neuro: acute motor dysfunction Psych: worsening mood Endocrine: polydipsia Heme: bleeding Allergy: hayfever  GEN: nad, alert and oriented HEENT:  mucous membranes moist NECK: supple w/o LA CV: rrr. PULM: ctab, no inc wob ABD: soft, +bs EXT: no edema SKIN: no acute rash

## 2016-12-27 NOTE — Assessment & Plan Note (Signed)
No sx now.  She isn't drinking. LFTs are normal.  D/w pt.  encouraged alcohol abstinence. Okay to use zofran prn.  No ADE on med.

## 2016-12-27 NOTE — Assessment & Plan Note (Signed)
B12 normal.  She feels well on the IM treatment but her level isn't high and she clearly is fatigued the last ~10 days prior to an injection.  We talked about B12 injection 2 weeks for a few months and recheck labs.

## 2016-12-27 NOTE — Assessment & Plan Note (Signed)
Living will d/w pt.  Would have her daughter designated if patient were incapacitated.   

## 2016-12-27 NOTE — Assessment & Plan Note (Signed)
Tetanus 2016 Flu today, 2018.  PNA during hospitalization 2014 Shingles shot d/w pt.  See avs.  D/w patient [id number] for colon cancer screening, including IFOB vs. colonoscopy.  Risks and benefits of both were discussed and patient voiced understanding.  Pt elects for: IFOB.   Breast cancer screening.   Due.  D/w pt.  She got a letter from the imaging site.  D/w pt.   DXA not due.   Living will d/w pt.  Would have her daughter designated if patient were incapacitated.  Pap up to date.   HIV screening 2006.

## 2016-12-27 NOTE — Assessment & Plan Note (Signed)
Recheck blood pressure is okay. Continue metoprolol. Continue work on diet and exercise. Labs discussed with patient.

## 2017-01-08 ENCOUNTER — Ambulatory Visit (INDEPENDENT_AMBULATORY_CARE_PROVIDER_SITE_OTHER): Payer: BLUE CROSS/BLUE SHIELD | Admitting: Podiatry

## 2017-01-08 ENCOUNTER — Ambulatory Visit (INDEPENDENT_AMBULATORY_CARE_PROVIDER_SITE_OTHER): Payer: BLUE CROSS/BLUE SHIELD

## 2017-01-08 ENCOUNTER — Other Ambulatory Visit: Payer: Self-pay | Admitting: Podiatry

## 2017-01-08 DIAGNOSIS — M722 Plantar fascial fibromatosis: Secondary | ICD-10-CM

## 2017-01-08 DIAGNOSIS — M79672 Pain in left foot: Secondary | ICD-10-CM

## 2017-01-11 NOTE — Progress Notes (Signed)
   Subjective: Patient presents today for stabbing pain and tenderness in the left heel that began 2-3 months ago. Patient states that it hurts in the mornings with the first steps out of bed. Standing and walking also increase the pain. She has been taking ibuprofen with some relief. Patient presents today for further treatment and evaluation.   Past Medical History:  Diagnosis Date  . Alcoholism (HCC)   . Cholecystitis   . GERD (gastroesophageal reflux disease)   . Hypertension   . Pancreatitis 10/2015   2013 alcohol and/or gallstone  . Photosensitivity    (No formal dx of lupus) prev eval Dr. Gavin Potters in [facility]     Objective: Physical Exam General: The patient is alert and oriented x3 in no acute distress.  Dermatology: Skin is warm, dry and supple bilateral lower extremities. Negative for open lesions or macerations bilateral.   Vascular: Dorsalis Pedis and Posterior Tibial pulses palpable bilateral.  Capillary fill time is immediate to all digits.  Neurological: Epicritic and protective threshold intact bilateral.   Musculoskeletal: Tenderness to palpation at the medial calcaneal tubercale and through the insertion of the plantar fascia of the left foot. All other joints range of motion within normal limits bilateral. Strength 5/5 in all groups bilateral.   Radiographic exam:   Normal osseous mineralization. Joint spaces preserved. No fracture/dislocation/boney destruction. Calcaneal spur present with mild thickening of plantar fascia left. No other soft tissue abnormalities or radiopaque foreign bodies.   Assessment: 1. Plantar fasciitis left foot  Plan of Care:  1. Patient evaluated. Xrays reviewed.   2. Injection of 0.5cc Celestone soluspan injected into the left plantar fascia.  3. Patient cannot tolerate oral NSAIDs. 4. Plantar fascial band(s) dispensed  5. Instructed patient regarding therapies and modalities at home to alleviate symptoms.  6. Patient had a  reaction to topical pain cream from [employer]. 7. Return to clinic in 4 weeks.     Felecia Shelling, DPM [facility]  Dr. Felecia Shelling, DPM    [address] [place], [place] [address]                Office [phone]  Fax [phone]

## 2017-01-12 ENCOUNTER — Ambulatory Visit: Payer: BLUE CROSS/BLUE SHIELD | Admitting: Podiatry

## 2017-01-13 MED ORDER — TRAMADOL HCL 50 MG PO TABS: 50.0000 mg | ORAL_TABLET | Freq: Three times a day (TID) | ORAL | 0 refills | Status: DC | PRN

## 2017-01-19 ENCOUNTER — Ambulatory Visit (INDEPENDENT_AMBULATORY_CARE_PROVIDER_SITE_OTHER): Payer: BLUE CROSS/BLUE SHIELD

## 2017-01-19 DIAGNOSIS — E538 Deficiency of other specified B group vitamins: Secondary | ICD-10-CM | POA: Diagnosis not present

## 2017-01-19 MED ORDER — CYANOCOBALAMIN 1000 MCG/ML IJ SOLN
1000.0000 ug | Freq: Once | INTRAMUSCULAR | Status: AC
  Administered 2017-01-19: 1000 ug via INTRAMUSCULAR

## 2017-01-19 MED ORDER — BETAMETHASONE SOD PHOS & ACET 6 (3-3) MG/ML IJ SUSP: 3.0000 mg | Freq: Once | INTRAMUSCULAR | Status: DC

## 2017-01-30 ENCOUNTER — Other Ambulatory Visit: Payer: Self-pay | Admitting: Family Medicine

## 2017-02-01 NOTE — Telephone Encounter (Signed)
Last office visit 12/25/16 Rx filled for Pantoprazole 12/25/16 #90/3 Please clarify if patient is to be on Omeprazole also?

## 2017-02-02 NOTE — Telephone Encounter (Signed)
Patient advised and says it was probably an auto refill.

## 2017-02-02 NOTE — Telephone Encounter (Signed)
Would continue pantoprazole only.  Thanks.

## 2017-02-04 ENCOUNTER — Ambulatory Visit (INDEPENDENT_AMBULATORY_CARE_PROVIDER_SITE_OTHER): Payer: BLUE CROSS/BLUE SHIELD

## 2017-02-04 DIAGNOSIS — E538 Deficiency of other specified B group vitamins: Secondary | ICD-10-CM

## 2017-02-04 MED ORDER — CYANOCOBALAMIN 1000 MCG/ML IJ SOLN
1000.0000 ug | Freq: Once | INTRAMUSCULAR | Status: AC
  Administered 2017-02-04: 1000 ug via INTRAMUSCULAR

## 2017-02-12 ENCOUNTER — Ambulatory Visit: Payer: BLUE CROSS/BLUE SHIELD | Admitting: Podiatry

## 2017-02-16 ENCOUNTER — Encounter: Payer: Self-pay | Admitting: Podiatry

## 2017-02-16 ENCOUNTER — Ambulatory Visit: Payer: BLUE CROSS/BLUE SHIELD | Admitting: Podiatry

## 2017-02-16 DIAGNOSIS — M722 Plantar fascial fibromatosis: Secondary | ICD-10-CM | POA: Diagnosis not present

## 2017-02-16 MED ORDER — TRAMADOL HCL 50 MG PO TABS: 50.0000 mg | ORAL_TABLET | Freq: Three times a day (TID) | ORAL | 0 refills | Status: DC | PRN

## 2017-02-16 MED ORDER — MELOXICAM 15 MG PO TABS: 15.0000 mg | ORAL_TABLET | Freq: Every day | ORAL | 0 refills | Status: DC

## 2017-02-18 ENCOUNTER — Ambulatory Visit (INDEPENDENT_AMBULATORY_CARE_PROVIDER_SITE_OTHER): Payer: BLUE CROSS/BLUE SHIELD

## 2017-02-18 DIAGNOSIS — E538 Deficiency of other specified B group vitamins: Secondary | ICD-10-CM | POA: Diagnosis not present

## 2017-02-18 MED ORDER — CYANOCOBALAMIN 1000 MCG/ML IJ SOLN
1000.0000 ug | Freq: Once | INTRAMUSCULAR | Status: AC
  Administered 2017-02-18: 1000 ug via INTRAMUSCULAR

## 2017-02-18 NOTE — Progress Notes (Signed)
   Subjective: Patient presents today for follow up evaluation of left plantar fasciitis. She states her pain has not changed since the previous visit. She denies any relief from the injection. She has not been wearing the fascial brace because she lost it. Patient presents today for further treatment and evaluation.   Past Medical History:  Diagnosis Date  . Alcoholism (HCC)   . Cholecystitis   . GERD (gastroesophageal reflux disease)   . Hypertension   . Pancreatitis 10/2015   2013 alcohol and/or gallstone  . Photosensitivity    (No formal dx of lupus) [facility]     Objective: Physical Exam General: The patient is alert and oriented x3 in no acute distress.  Dermatology: Skin is warm, dry and supple bilateral lower extremities. Negative for open lesions or macerations bilateral.   Vascular: Dorsalis Pedis and Posterior Tibial pulses palpable bilateral.  Capillary fill time is immediate to all digits.  Neurological: Epicritic and protective threshold intact bilateral.   Musculoskeletal: Tenderness to palpation at the medial calcaneal tubercale and through the insertion of the plantar fascia of the left foot. All other joints range of motion within normal limits bilateral. Strength 5/5 in all groups bilateral.    Assessment: 1. Plantar fasciitis left foot  Plan of Care:  1. Patient evaluated.    2. Injection of 0.5cc Celestone soluspan injected into the left plantar fascia.  3. Refill prescription for Tramadol 50 mg given to patient. 4. New fascial brace dispensed. Recommended wearing regularly. 5. Prescription for Meloxicam 15 mg provided for patient. 6. Patient had a reaction to topical pain cream from [facility]. 7. Return to clinic in 4 weeks.    Felecia ShellingBrent M. Maliah Pyles, DPM [facility]  Dr. Felecia ShellingBrent M. Micky Overturf, DPM    [address]                                     [place], [place] [address]                Office [phone]  [phone]  Fax [phone]

## 2017-03-04 ENCOUNTER — Ambulatory Visit (INDEPENDENT_AMBULATORY_CARE_PROVIDER_SITE_OTHER): Payer: BLUE CROSS/BLUE SHIELD | Admitting: Family Medicine

## 2017-03-04 DIAGNOSIS — E538 Deficiency of other specified B group vitamins: Secondary | ICD-10-CM | POA: Diagnosis not present

## 2017-03-04 MED ORDER — CYANOCOBALAMIN 1000 MCG/ML IJ SOLN
1000.0000 ug | Freq: Once | INTRAMUSCULAR | Status: AC
  Administered 2017-03-04: 1000 ug via INTRAMUSCULAR

## 2017-03-05 ENCOUNTER — Other Ambulatory Visit: Payer: Self-pay | Admitting: Family Medicine

## 2017-03-05 ENCOUNTER — Encounter: Payer: Self-pay | Admitting: Family Medicine

## 2017-03-05 NOTE — Progress Notes (Signed)
RN visit

## 2017-03-05 NOTE — Telephone Encounter (Signed)
Electronic refill request. Ondansetron Last office visit:   03/04/17 Last Filled:     30 tablet 2 12/25/2016  Please advise.   See MyChart message.

## 2017-03-07 NOTE — Telephone Encounter (Signed)
Sent.  Thanks. Notified pt via mychart.

## 2017-03-16 ENCOUNTER — Ambulatory Visit: Payer: BLUE CROSS/BLUE SHIELD | Admitting: Podiatry

## 2017-03-23 ENCOUNTER — Ambulatory Visit: Payer: BLUE CROSS/BLUE SHIELD

## 2017-03-23 ENCOUNTER — Encounter: Payer: Self-pay | Admitting: Podiatry

## 2017-03-23 ENCOUNTER — Ambulatory Visit: Payer: BLUE CROSS/BLUE SHIELD | Admitting: Podiatry

## 2017-03-23 DIAGNOSIS — M722 Plantar fascial fibromatosis: Secondary | ICD-10-CM

## 2017-03-23 MED ORDER — TRAMADOL HCL 50 MG PO TABS: 50.0000 mg | ORAL_TABLET | Freq: Three times a day (TID) | ORAL | 0 refills | Status: DC | PRN

## 2017-03-23 MED ORDER — MELOXICAM 15 MG PO TABS: 15.0000 mg | ORAL_TABLET | Freq: Every day | ORAL | 1 refills | Status: DC

## 2017-03-25 ENCOUNTER — Other Ambulatory Visit (INDEPENDENT_AMBULATORY_CARE_PROVIDER_SITE_OTHER): Payer: BLUE CROSS/BLUE SHIELD

## 2017-03-25 ENCOUNTER — Ambulatory Visit: Payer: BLUE CROSS/BLUE SHIELD

## 2017-03-25 DIAGNOSIS — E538 Deficiency of other specified B group vitamins: Secondary | ICD-10-CM

## 2017-03-25 LAB — VITAMIN B12: Vitamin B-12: 762 pg/mL (ref 211–911)

## 2017-03-25 MED ORDER — CYANOCOBALAMIN 1000 MCG/ML IJ SOLN
1000.0000 ug | Freq: Once | INTRAMUSCULAR | Status: AC
  Administered 2017-03-25: 1000 ug via INTRAMUSCULAR

## 2017-03-25 NOTE — Addendum Note (Signed)
Addended by: Eual FinesBRIDGES, Shamiyah Ngu P on: 03/25/2017 02:53 PM   Modules accepted: Orders

## 2017-03-25 NOTE — Progress Notes (Signed)
   Subjective: Patient presents today for follow up evaluation of left plantar fasciitis. She states she is doing much better and her pain has gotten better, but has not resolved completely. She states the injection helped to alleviate the pain. She is also taking Mobic as directed which helps alleviate the pain as well. She is requesting another injection today. Patient presents today for further treatment and evaluation.   Past Medical History:  Diagnosis Date  . Alcoholism (HCC)   . Cholecystitis   . GERD (gastroesophageal reflux disease)   . Hypertension   . Pancreatitis 10/2015   2013 alcohol and/or gallstone  . Photosensitivity    (No formal dx of lupus) [facility]     Objective: Physical Exam General: The patient is alert and oriented x3 in no acute distress.  Dermatology: Skin is warm, dry and supple bilateral lower extremities. Negative for open lesions or macerations bilateral.   Vascular: Dorsalis Pedis and Posterior Tibial pulses palpable bilateral.  Capillary fill time is immediate to all digits.  Neurological: Epicritic and protective threshold intact bilateral.   Musculoskeletal: Tenderness to palpation at the medial calcaneal tubercale and through the insertion of the plantar fascia of the left foot. All other joints range of motion within normal limits bilateral. Strength 5/5 in all groups bilateral.    Assessment: 1. Plantar fasciitis left foot  Plan of Care:  1. Patient evaluated.    2. Injection of 0.5cc Celestone soluspan injected into the left plantar fascia.  3. Refill prescription for Tramadol 50 mg #60 given to patient. 4. Refill prescription for Mobic 15 mg #60 provided to patient.  5. Continue wearing plantar fascial brace. 6. Return to clinic when necessary.   [place] for [place].   Felecia ShellingBrent M. Kedrick Mcnamee, DPM [facility]  Dr. Felecia ShellingBrent M. Arleny Kruger, DPM    [address]                                      [place], [place] [address]                Office [phone]  Fax [phone]

## 2017-03-25 NOTE — Addendum Note (Signed)
Addended by: Alvina ChouWALSH, TERRI J on: 03/25/2017 02:54 PM   Modules accepted: Orders

## 2017-03-26 ENCOUNTER — Encounter: Payer: Self-pay | Admitting: *Deleted

## 2017-04-06 DIAGNOSIS — I81 Portal vein thrombosis: Secondary | ICD-10-CM

## 2017-04-06 HISTORY — DX: Portal vein thrombosis: I81

## 2017-04-08 ENCOUNTER — Ambulatory Visit (INDEPENDENT_AMBULATORY_CARE_PROVIDER_SITE_OTHER): Payer: BLUE CROSS/BLUE SHIELD

## 2017-04-08 DIAGNOSIS — E538 Deficiency of other specified B group vitamins: Secondary | ICD-10-CM

## 2017-04-08 MED ORDER — CYANOCOBALAMIN 1000 MCG/ML IJ SOLN
1000.0000 ug | Freq: Once | INTRAMUSCULAR | Status: AC
  Administered 2017-04-08: 1000 ug via INTRAMUSCULAR

## 2017-04-11 NOTE — Progress Notes (Signed)
Noted  

## 2017-04-22 ENCOUNTER — Ambulatory Visit (INDEPENDENT_AMBULATORY_CARE_PROVIDER_SITE_OTHER): Payer: BLUE CROSS/BLUE SHIELD

## 2017-04-22 DIAGNOSIS — E538 Deficiency of other specified B group vitamins: Secondary | ICD-10-CM

## 2017-04-22 MED ORDER — CYANOCOBALAMIN 1000 MCG/ML IJ SOLN
1000.0000 ug | Freq: Once | INTRAMUSCULAR | Status: AC
  Administered 2017-04-22: 1000 ug via INTRAMUSCULAR

## 2017-05-06 ENCOUNTER — Ambulatory Visit (INDEPENDENT_AMBULATORY_CARE_PROVIDER_SITE_OTHER): Payer: BLUE CROSS/BLUE SHIELD

## 2017-05-06 DIAGNOSIS — E538 Deficiency of other specified B group vitamins: Secondary | ICD-10-CM

## 2017-05-06 MED ORDER — CYANOCOBALAMIN 1000 MCG/ML IJ SOLN
1000.0000 ug | Freq: Once | INTRAMUSCULAR | Status: AC
Start: 2017-05-06 — End: 2017-05-06
  Administered 2017-05-06: 1000 ug via INTRAMUSCULAR

## 2017-05-13 ENCOUNTER — Encounter: Payer: Self-pay | Admitting: Emergency Medicine

## 2017-05-13 ENCOUNTER — Inpatient Hospital Stay
Admission: EM | Admit: 2017-05-13 | Discharge: 2017-05-16 | DRG: 871 | Disposition: A | Discharge status: Home / Self Care | Payer: BLUE CROSS/BLUE SHIELD | Attending: Internal Medicine | Admitting: Internal Medicine

## 2017-05-13 ENCOUNTER — Emergency Department: Payer: BLUE CROSS/BLUE SHIELD

## 2017-05-13 ENCOUNTER — Other Ambulatory Visit: Payer: Self-pay

## 2017-05-13 DIAGNOSIS — Z791 Long term (current) use of non-steroidal anti-inflammatories (NSAID): Secondary | ICD-10-CM

## 2017-05-13 DIAGNOSIS — G629 Polyneuropathy, unspecified: Secondary | ICD-10-CM | POA: Diagnosis present

## 2017-05-13 DIAGNOSIS — Z9049 Acquired absence of other specified parts of digestive tract: Secondary | ICD-10-CM | POA: Diagnosis not present

## 2017-05-13 DIAGNOSIS — A4151 Sepsis due to Escherichia coli [E. coli]: Principal | ICD-10-CM | POA: Diagnosis present

## 2017-05-13 DIAGNOSIS — A419 Sepsis, unspecified organism: Secondary | ICD-10-CM | POA: Diagnosis present

## 2017-05-13 DIAGNOSIS — I10 Essential (primary) hypertension: Secondary | ICD-10-CM | POA: Diagnosis present

## 2017-05-13 DIAGNOSIS — R112 Nausea with vomiting, unspecified: Secondary | ICD-10-CM

## 2017-05-13 DIAGNOSIS — Z8051 Family history of malignant neoplasm of kidney: Secondary | ICD-10-CM

## 2017-05-13 DIAGNOSIS — Z803 Family history of malignant neoplasm of breast: Secondary | ICD-10-CM | POA: Diagnosis not present

## 2017-05-13 DIAGNOSIS — R6520 Severe sepsis without septic shock: Secondary | ICD-10-CM | POA: Diagnosis present

## 2017-05-13 DIAGNOSIS — K760 Fatty (change of) liver, not elsewhere classified: Secondary | ICD-10-CM | POA: Diagnosis present

## 2017-05-13 DIAGNOSIS — D6959 Other secondary thrombocytopenia: Secondary | ICD-10-CM | POA: Diagnosis present

## 2017-05-13 DIAGNOSIS — E871 Hypo-osmolality and hyponatremia: Secondary | ICD-10-CM | POA: Diagnosis present

## 2017-05-13 DIAGNOSIS — Z8249 Family history of ischemic heart disease and other diseases of the circulatory system: Secondary | ICD-10-CM

## 2017-05-13 DIAGNOSIS — I81 Portal vein thrombosis: Secondary | ICD-10-CM | POA: Diagnosis present

## 2017-05-13 DIAGNOSIS — Z91040 Latex allergy status: Secondary | ICD-10-CM

## 2017-05-13 DIAGNOSIS — E878 Other disorders of electrolyte and fluid balance, not elsewhere classified: Secondary | ICD-10-CM | POA: Diagnosis present

## 2017-05-13 DIAGNOSIS — F1020 Alcohol dependence, uncomplicated: Secondary | ICD-10-CM | POA: Diagnosis present

## 2017-05-13 DIAGNOSIS — R748 Abnormal levels of other serum enzymes: Secondary | ICD-10-CM

## 2017-05-13 DIAGNOSIS — Z886 Allergy status to analgesic agent status: Secondary | ICD-10-CM | POA: Diagnosis not present

## 2017-05-13 DIAGNOSIS — E860 Dehydration: Secondary | ICD-10-CM

## 2017-05-13 DIAGNOSIS — E876 Hypokalemia: Secondary | ICD-10-CM | POA: Diagnosis not present

## 2017-05-13 DIAGNOSIS — Z888 Allergy status to other drugs, medicaments and biological substances status: Secondary | ICD-10-CM

## 2017-05-13 DIAGNOSIS — K219 Gastro-esophageal reflux disease without esophagitis: Secondary | ICD-10-CM | POA: Diagnosis present

## 2017-05-13 DIAGNOSIS — E872 Acidosis: Secondary | ICD-10-CM | POA: Diagnosis present

## 2017-05-13 LAB — URINALYSIS, COMPLETE (UACMP) WITH MICROSCOPIC
Bilirubin Urine: NEGATIVE
GLUCOSE, UA: NEGATIVE mg/dL
Ketones, ur: 20 mg/dL — AB
NITRITE: POSITIVE — AB
PH: 6 (ref 5.0–8.0)
Protein, ur: NEGATIVE mg/dL
SPECIFIC GRAVITY, URINE: 1.025 (ref 1.005–1.030)

## 2017-05-13 LAB — GASTROINTESTINAL PANEL BY PCR, STOOL (REPLACES STOOL CULTURE)

## 2017-05-13 LAB — CBC WITH DIFFERENTIAL/PLATELET
Basophils Absolute: 0 10*3/uL (ref 0–0.1)
Basophils Relative: 0 %
EOS ABS: 0 10*3/uL (ref 0–0.7)
Eosinophils Relative: 1 %
HEMATOCRIT: 46.1 % (ref 35.0–47.0)
HEMOGLOBIN: 15.8 g/dL (ref 12.0–16.0)
LYMPHS ABS: 0.6 10*3/uL — AB (ref 1.0–3.6)
Lymphocytes Relative: 14 %
MCH: 33.4 pg (ref 26.0–34.0)
MCHC: 34.3 g/dL (ref 32.0–36.0)
MCV: 97.4 fL (ref 80.0–100.0)
MONO ABS: 0.2 10*3/uL (ref 0.2–0.9)
MONOS PCT: 5 %
NEUTROS PCT: 80 %
Neutro Abs: 3.2 10*3/uL (ref 1.4–6.5)
Platelets: 143 10*3/uL — ABNORMAL LOW (ref 150–440)
RBC: 4.73 MIL/uL (ref 3.80–5.20)
RDW: 13.9 % (ref 11.5–14.5)
WBC: 4 10*3/uL (ref 3.6–11.0)

## 2017-05-13 LAB — INFLUENZA PANEL BY PCR (TYPE A & B)
Influenza A By PCR: NEGATIVE
Influenza B By PCR: NEGATIVE

## 2017-05-13 LAB — COMPREHENSIVE METABOLIC PANEL
ALK PHOS: 56 U/L (ref 38–126)
ALT: 63 U/L — ABNORMAL HIGH (ref 14–54)
ANION GAP: 18 — AB (ref 5–15)
AST: 133 U/L — ABNORMAL HIGH (ref 15–41)
Albumin: 3.5 g/dL (ref 3.5–5.0)
BILIRUBIN TOTAL: 2.3 mg/dL — AB (ref 0.3–1.2)
BUN: 14 mg/dL (ref 6–20)
CALCIUM: 8.8 mg/dL — AB (ref 8.9–10.3)
CO2: 17 mmol/L — ABNORMAL LOW (ref 22–32)
Chloride: 97 mmol/L — ABNORMAL LOW (ref 101–111)
Creatinine, Ser: 0.97 mg/dL (ref 0.44–1.00)
GFR calc non Af Amer: >60 mL/min (ref >60)
Glucose, Bld: 128 mg/dL — ABNORMAL HIGH (ref 65–99)
POTASSIUM: 3.7 mmol/L (ref 3.5–5.1)
SODIUM: 132 mmol/L — AB (ref 135–145)
TOTAL PROTEIN: 7.8 g/dL (ref 6.5–8.1)

## 2017-05-13 LAB — PROCALCITONIN: PROCALCITONIN: 44.61 ng/mL

## 2017-05-13 LAB — LACTIC ACID, PLASMA
LACTIC ACID, VENOUS: 2.8 mmol/L — AB (ref 0.5–1.9)
Lactic Acid, Venous: 1.1 mmol/L (ref 0.5–1.9)

## 2017-05-13 LAB — LIPASE, BLOOD: LIPASE: 22 U/L (ref 11–51)

## 2017-05-13 MED ORDER — ONDANSETRON HCL 4 MG PO TABS: 4.0000 mg | ORAL_TABLET | Freq: Three times a day (TID) | ORAL | Status: DC | PRN

## 2017-05-13 MED ORDER — TRAMADOL HCL 50 MG PO TABS
50.0000 mg | ORAL_TABLET | Freq: Four times a day (QID) | ORAL | Status: DC | PRN
  Administered 2017-05-14 – 2017-05-15 (×4): 50 mg via ORAL
  Filled 2017-05-13 (×4): qty 1

## 2017-05-13 MED ORDER — SODIUM CHLORIDE 0.9 % IV BOLUS (SEPSIS)
1000.0000 mL | Freq: Once | INTRAVENOUS | Status: AC
  Administered 2017-05-13: 1000 mL via INTRAVENOUS

## 2017-05-13 MED ORDER — GABAPENTIN 300 MG PO CAPS
600.0000 mg | ORAL_CAPSULE | Freq: Every morning | ORAL | Status: DC
  Administered 2017-05-14 – 2017-05-15 (×2): 600 mg via ORAL
  Filled 2017-05-13 (×3): qty 2

## 2017-05-13 MED ORDER — ACETAMINOPHEN 500 MG PO TABS
1000.0000 mg | ORAL_TABLET | Freq: Once | ORAL | Status: AC
  Administered 2017-05-13: 1000 mg via ORAL

## 2017-05-13 MED ORDER — LORAZEPAM 1 MG PO TABS
1.0000 mg | ORAL_TABLET | Freq: Four times a day (QID) | ORAL | Status: DC | PRN
  Administered 2017-05-13: 1 mg via ORAL
  Filled 2017-05-13: qty 1

## 2017-05-13 MED ORDER — MELOXICAM 7.5 MG PO TABS
15.0000 mg | ORAL_TABLET | Freq: Every day | ORAL | Status: DC
  Administered 2017-05-14 – 2017-05-15 (×2): 15 mg via ORAL
  Filled 2017-05-13 (×3): qty 2

## 2017-05-13 MED ORDER — SODIUM CHLORIDE 0.9 % IV BOLUS (SEPSIS)
1000.0000 mL | Freq: Once | INTRAVENOUS | Status: AC
  Administered 2017-05-13: 17:00:00 1000 mL via INTRAVENOUS

## 2017-05-13 MED ORDER — DIPHENHYDRAMINE HCL 50 MG/ML IJ SOLN
25.0000 mg | Freq: Once | INTRAMUSCULAR | Status: AC
  Administered 2017-05-13: 25 mg via INTRAVENOUS
  Filled 2017-05-13: qty 1

## 2017-05-13 MED ORDER — ACETAMINOPHEN 500 MG PO TABS
ORAL_TABLET | ORAL | Status: AC
  Administered 2017-05-13: 1000 mg via ORAL
  Filled 2017-05-13: qty 1

## 2017-05-13 MED ORDER — VITAMIN B-1 100 MG PO TABS
100.0000 mg | ORAL_TABLET | Freq: Every day | ORAL | Status: DC
  Administered 2017-05-13 – 2017-05-15 (×3): 100 mg via ORAL
  Filled 2017-05-13 (×4): qty 1

## 2017-05-13 MED ORDER — THIAMINE HCL 100 MG/ML IJ SOLN
100.0000 mg | Freq: Every day | INTRAMUSCULAR | Status: DC
  Filled 2017-05-13 (×3): qty 1

## 2017-05-13 MED ORDER — ADULT MULTIVITAMIN W/MINERALS CH
1.0000 | ORAL_TABLET | Freq: Every day | ORAL | Status: DC
  Administered 2017-05-13 – 2017-05-15 (×3): 1 via ORAL
  Filled 2017-05-13 (×5): qty 1

## 2017-05-13 MED ORDER — CYANOCOBALAMIN 1000 MCG/ML IJ SOLN
1000.0000 ug | INTRAMUSCULAR | Status: DC
  Filled 2017-05-13: qty 1

## 2017-05-13 MED ORDER — SODIUM CHLORIDE 0.9 % IV BOLUS (SEPSIS)
1000.0000 mL | Freq: Once | INTRAVENOUS | Status: AC
  Administered 2017-05-13: 18:00:00 1000 mL via INTRAVENOUS

## 2017-05-13 MED ORDER — FOLIC ACID 1 MG PO TABS
1.0000 mg | ORAL_TABLET | Freq: Every day | ORAL | Status: DC
  Administered 2017-05-13 – 2017-05-15 (×3): 1 mg via ORAL
  Filled 2017-05-13 (×4): qty 1

## 2017-05-13 MED ORDER — ENOXAPARIN SODIUM 40 MG/0.4ML ~~LOC~~ SOLN
40.0000 mg | SUBCUTANEOUS | Status: DC
  Administered 2017-05-13: 40 mg via SUBCUTANEOUS
  Filled 2017-05-13: qty 0.4

## 2017-05-13 MED ORDER — PROMETHAZINE HCL 25 MG PO TABS
12.5000 mg | ORAL_TABLET | Freq: Four times a day (QID) | ORAL | Status: DC | PRN
  Administered 2017-05-14: 12.5 mg via ORAL
  Filled 2017-05-13 (×2): qty 1

## 2017-05-13 MED ORDER — METOPROLOL TARTRATE 50 MG PO TABS
50.0000 mg | ORAL_TABLET | Freq: Two times a day (BID) | ORAL | Status: DC
  Administered 2017-05-13 – 2017-05-15 (×5): 50 mg via ORAL
  Filled 2017-05-13 (×6): qty 1

## 2017-05-13 MED ORDER — SODIUM CHLORIDE 0.9 % IV SOLN: INTRAVENOUS | Status: DC

## 2017-05-13 MED ORDER — PROCHLORPERAZINE EDISYLATE 5 MG/ML IJ SOLN
10.0000 mg | Freq: Once | INTRAMUSCULAR | Status: AC
  Administered 2017-05-13: 10 mg via INTRAVENOUS
  Filled 2017-05-13: qty 2

## 2017-05-13 MED ORDER — RISAQUAD PO CAPS
1.0000 | ORAL_CAPSULE | Freq: Three times a day (TID) | ORAL | Status: DC
  Administered 2017-05-13 – 2017-05-15 (×7): 1 via ORAL
  Filled 2017-05-13 (×8): qty 1

## 2017-05-13 MED ORDER — SODIUM CHLORIDE 0.9 % IV BOLUS (SEPSIS)
500.0000 mL | Freq: Once | INTRAVENOUS | Status: AC
  Administered 2017-05-13: 500 mL via INTRAVENOUS

## 2017-05-13 MED ORDER — IOPAMIDOL (ISOVUE-300) INJECTION 61%
100.0000 mL | Freq: Once | INTRAVENOUS | Status: AC | PRN
  Administered 2017-05-13: 100 mL via INTRAVENOUS

## 2017-05-13 MED ORDER — LORAZEPAM 2 MG/ML IJ SOLN: 1.0000 mg | Freq: Four times a day (QID) | INTRAMUSCULAR | Status: DC | PRN

## 2017-05-13 MED ORDER — GABAPENTIN 300 MG PO CAPS
300.0000 mg | ORAL_CAPSULE | Freq: Every day | ORAL | Status: DC
  Administered 2017-05-13 – 2017-05-15 (×3): 300 mg via ORAL
  Filled 2017-05-13 (×3): qty 1

## 2017-05-13 MED ORDER — PANTOPRAZOLE SODIUM 40 MG PO TBEC
40.0000 mg | DELAYED_RELEASE_TABLET | Freq: Every day | ORAL | Status: DC
  Administered 2017-05-14 – 2017-05-15 (×2): 40 mg via ORAL
  Filled 2017-05-13 (×3): qty 1

## 2017-05-13 NOTE — H&P (Signed)
[facility] [place] [facility]   PATIENT NAME: Abigail [name]    MR#:  [id number]  DATE OF BIRTH:  1960/05/05  DATE OF ADMISSION:  05/13/2017  PRIMARY CARE PHYSICIAN: Joaquim Nam, MD   REQUESTING/REFERRING PHYSICIAN:   CHIEF COMPLAINT:   Chief Complaint  Patient presents with  . Emesis    HISTORY OF PRESENT ILLNESS: Abigail [name]  is a 57 y.o. female with a known history of per below presenting from home with 2-day history of nausea, vomiting, diarrhea on yesterday, headache, decreased p.o. intake, productive cough, generalized weakness, fatigue, fevers, chills, night sweats, in the emergency room patient was found to be febrile, tachycardic, sodium 132, chloride 97, lactic acid 2.8, anion gap 18, AST 133, ALT 63, CT abdomen noted for pancreatic head inflammation/fatty liver, lipase was normal, on examination patient is diaphoretic, warm to touch, patient is now being admitted for acute sepsis most likely secondary to viral illness.  PAST MEDICAL HISTORY:   Past Medical History:  Diagnosis Date  . Alcoholism (HCC)   . Cholecystitis   . GERD (gastroesophageal reflux disease)   . Hypertension   . Pancreatitis 10/2015   2013 alcohol and/or gallstone  . Photosensitivity    (No formal dx of lupus) [facility]    PAST SURGICAL HISTORY:  Past Surgical History:  Procedure Laterality Date  . APPENDECTOMY     As a child  . CAT scan  11/00   wnl  . CHOLECYSTECTOMY  04/20/2012   Procedure: LAPAROSCOPIC CHOLECYSTECTOMY WITH INTRAOPERATIVE CHOLANGIOGRAM;  Surgeon: Robyne Askew, MD;  Location: [facility] OR;  Service: General;  Laterality: N/A;  . DOBUTAMINE STRESS ECHO  11/00   wnl  . DOPPLER ECHOCARDIOGRAPHY     Secondary to Redux, wnl  . ESOPHAGOGASTRODUODENOSCOPY  8/01   Esophagitis [name])  . FOOT SURGERY  04/2015   Pin in small toe  . 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 NSVD     X 2  . SBFT  08/01   wnl  .  TONSILLECTOMY     As a child  . TUBAL LIGATION  Late 20's    SOCIAL HISTORY:  Social History   Tobacco Use  . Smoking status: Never Smoker  . Smokeless tobacco: Never Used  Substance Use Topics  . Alcohol use: Yes    FAMILY HISTORY:  Family History  Problem Relation Age of Onset  . Heart disease Mother        CAD, angioplasty twice with stents  . Cancer Mother        Breast  . Breast cancer Mother 25  . Hypertension Mother   . Peripheral Artery Disease Mother   . Hypertension Father   . Hypothyroidism Father   . Heart disease Father        MI at age 41  . Cancer Father        H/O renal CA  . Diabetes Sister   . Cancer Sister        Renal CA  . Colon cancer Neg Hx     DRUG ALLERGIES:  Allergies  Allergen Reactions  . Latex Rash    "hands turn beat red and start to itch"  . Atorvastatin Other (See Comments)    Swollen joints  . Dilaudid [Hydromorphone Hcl] Itching and Other (See Comments)    Itching- no rash- likely side effect but not true allergy.  Tolerates oxycodone.   [name] [Escitalopram Oxalate] Other (  See Comments)    Psychological changes  . Lisinopril Other (See Comments)    Hives, presumed allergy  . Mirtazapine Swelling    swelling  . Nsaids     Gastritis 2013  . Tylenol [Acetaminophen] Other (See Comments)    Hx pancreatitis    REVIEW OF SYSTEMS:   CONSTITUTIONAL: + fever, fatigue /generalized weakness.  EYES: No blurred or double vision.  EARS, NOSE, AND THROAT: No tinnitus or ear pain. "Chronic wet nose" RESPIRATORY:+o cough, shortness of breath, no wheezing or hemoptysis.  CARDIOVASCULAR: No chest pain, orthopnea, edema.  GASTROINTESTINAL: + nausea, vomiting, diarrhea and vague abdominal pain.  GENITOURINARY: No dysuria, hematuria.  ENDOCRINE: No polyuria, nocturia,  HEMATOLOGY: No anemia, easy bruising or bleeding SKIN: No rash or lesion. MUSCULOSKELETAL: No joint pain or arthritis.   NEUROLOGIC: No tingling, numbness, +  generalized weakness.  PSYCHIATRY: No anxiety or depression.   MEDICATIONS AT HOME:  Prior to Admission medications   Medication Sig Start Date End Date Taking? Authorizing Provider  cyanocobalamin (,VITAMIN B-12,) 1000 MCG/ML injection Inject 1,000 mcg into the muscle every 14 (fourteen) days.   Yes [provider]  gabapentin (NEURONTIN) 300 MG capsule Take 1-2 capsules (300-600 mg total) by mouth 2 (two) times daily. 11/24/16  Yes Penumalli, Glenford Bayley, MD  meloxicam (MOBIC) 15 MG tablet Take 1 tablet (15 mg total) by mouth daily. 03/23/17  Yes Felecia Shelling, DPM  metoprolol tartrate (LOPRESSOR) 50 MG tablet TAKE 1 TABLET (50 MG TOTAL) BY MOUTH 2 (TWO) TIMES DAILY. 09/28/16  Yes Joaquim Nam, MD  pantoprazole (PROTONIX) 40 MG tablet Take 1 tablet (40 mg total) by mouth daily. 12/25/16  Yes Joaquim Nam, MD  ondansetron (ZOFRAN) 4 MG tablet TAKE 1-2 TABLETS BY MOUTH EVERY 8 HOURS AS NEEDED FOR NAUSEA OR VOMITING 03/07/17   Joaquim Nam, MD  traMADol (ULTRAM) 50 MG tablet Take 1 tablet (50 mg total) by mouth every 8 (eight) hours as needed. 03/23/17   Felecia Shelling, DPM      PHYSICAL EXAMINATION:   VITAL SIGNS: Blood pressure 108/78, pulse (!) 106, temperature (!) 103 F (39.4 C), temperature source Oral, resp. rate 18, height 5' (1.524 m), weight 76.7 kg (169 lb), SpO2 98 %.  GENERAL:  57 y.o.-year-old patient lying in the bed with no acute distress.  Diaphoretic/toxic appearing, obese EYES: Pupils equal, round, reactive to light and accommodation. No scleral icterus. Extraocular muscles intact.  HEENT: Head atraumatic, normocephalic. Oropharynx and nasopharynx clear.  NECK:  Supple, no jugular venous distention. No thyroid enlargement, no tenderness.  LUNGS: Normal breath sounds bilaterally, no wheezing, rales,rhonchi or crepitation. No use of accessory muscles of respiration.  CARDIOVASCULAR: S1, S2 normal. No murmurs, rubs, or gallops.  ABDOMEN: Soft, nontender,  nondistended. Bowel sounds present. No organomegaly or mass.  EXTREMITIES: No pedal edema, cyanosis, or clubbing.  NEUROLOGIC: Cranial nerves II through XII are intact. MAES. Sensation intact. Gait not checked.  PSYCHIATRIC: The patient is alert and oriented x 3.  SKIN: No obvious rash, lesion, or ulcer.   LABORATORY PANEL:   CBC Recent Labs  Lab 05/13/17 1137  WBC 4.0  HGB 15.8  HCT 46.1  PLT 143*  MCV 97.4  MCH 33.4  MCHC 34.3  RDW 13.9  LYMPHSABS 0.6*  MONOABS 0.2  EOSABS 0.0  BASOSABS 0.0   ------------------------------------------------------------------------------------------------------------------  Chemistries  Recent Labs  Lab 05/13/17 1137  NA 132*  K 3.7  CL 97*  CO2 17*  GLUCOSE 128*  BUN 14  CREATININE 0.97  CALCIUM 8.8*  AST 133*  ALT 63*  ALKPHOS 56  BILITOT 2.3*   ------------------------------------------------------------------------------------------------------------------ estimated creatinine clearance is 60.7 mL/min (by C-G formula based on SCr of 0.97 mg/dL). ------------------------------------------------------------------------------------------------------------------ No results for input(s): TSH, T4TOTAL, T3FREE, THYROIDAB in the last 72 hours.  Invalid input(s): FREET3   Coagulation profile No results for input(s): INR, PROTIME in the last 168 hours. ------------------------------------------------------------------------------------------------------------------- No results for input(s): DDIMER in the last 72 hours. -------------------------------------------------------------------------------------------------------------------  Cardiac Enzymes No results for input(s): CKMB, TROPONINI, MYOGLOBIN in the last 168 hours.  Invalid input(s): CK ------------------------------------------------------------------------------------------------------------------ Invalid input(s):  POCBNP  ---------------------------------------------------------------------------------------------------------------  Urinalysis    Component Value Date/Time   COLORURINE YELLOW (A) 01/06/2015 0956   APPEARANCEUR CLOUDY (A) 01/06/2015 0956   APPEARANCEUR Clear 06/16/2013 1857   LABSPEC 1.010 01/06/2015 0956   LABSPEC 1.024 06/16/2013 1857   PHURINE 6.0 01/06/2015 0956   GLUCOSEU NEGATIVE 01/06/2015 0956   GLUCOSEU Negative 06/16/2013 1857   HGBUR 1+ (A) 01/06/2015 0956   BILIRUBINUR NEGATIVE 01/06/2015 0956   BILIRUBINUR Negative 06/16/2013 1857   KETONESUR 1+ (A) 01/06/2015 0956   PROTEINUR 30 (A) 01/06/2015 0956   UROBILINOGEN 2.0 (H) 04/19/2012 1205   NITRITE POSITIVE (A) 01/06/2015 0956   LEUKOCYTESUR 3+ (A) 01/06/2015 0956   LEUKOCYTESUR Negative 06/16/2013 1857     RADIOLOGY: Dg Chest 2 View  Result Date: 05/13/2017 CLINICAL DATA:  Nausea and vomiting, fever EXAM: CHEST  2 VIEW COMPARISON:  07/13/2016 FINDINGS: Hypoventilation with mild atelectasis in the bases. Negative for heart failure or pneumonia. No pleural effusion IMPRESSION: Hypoventilation with mild bibasilar atelectasis. Electronically Signed   By: Marlan Palau M.D.   On: 05/13/2017 12:44   Ct Abdomen Pelvis W Contrast  Result Date: 05/13/2017 CLINICAL DATA:  Epigastric pain for the last week or more. Nausea and vomiting. Elevated liver enzymes. History of pancreatitis. EXAM: CT ABDOMEN AND PELVIS WITH CONTRAST TECHNIQUE: Multidetector CT imaging of the abdomen and pelvis was performed using the standard protocol following bolus administration of intravenous contrast. CONTRAST:  ISOVUE-300 IOPAMIDOL (ISOVUE-300) INJECTION 61% COMPARISON:  10/26/2015 FINDINGS: Lower chest: Lung bases are clear.  No pleural or pericardial fluid. Hepatobiliary: Mild diffuse fatty change of the liver. 5 mm low-density in the ventral right lobe is unchanged and benign. No other focal liver finding. Previous cholecystectomy.  Pancreas: Mild inflammatory change of the fat adjacent to the head of the pancreas. No advanced pancreatitis. No pseudocyst. No visible ductal stone. Spleen: Normal Adrenals/Urinary Tract: Adrenal glands are normal. Kidneys are normal. Bladder is normal. Stomach/Bowel: Previous Roux-en-Y gastric bypass surgery. No sign of obstruction or significant bowel finding. Vascular/Lymphatic: Aorta is normal. IVC is normal. No retroperitoneal adenopathy. Reproductive: Normal Other: No free fluid or air. Musculoskeletal: Ordinary lower lumbar degenerative changes. IMPRESSION: Question early inflammatory change in the pancreatic head region. No advanced diffuse pancreatitis as was seen previously. Previous cholecystectomy.  Mild fatty change of the liver. Previous Roux-en-Y gastric bypass without evidence of obstruction or other complication. Electronically Signed   By: Paulina Fusi M.D.   On: 05/13/2017 13:37    EKG: Orders placed or performed during the hospital encounter of 07/13/16  . EKG 12-Lead  . EKG 12-Lead  . ED EKG within 10 minutes  . ED EKG within 10 minutes  . EKG    IMPRESSION AND PLAN: 1 acute sepsis Most likely secondary to viral syndrome Admit to regular nursing floor bed, avoid antibiotics at this time, IV fluids for rehydration, check  viral respiratory panel, GI panel, IV fluids for rehydration, and continue close medical monitoring  2 acute nausea/vomiting/diarrhea Suspected due to acute viral gastroenteritis Check GI panel stated above, antiemetics PRN, IV fluids for rehydration  3 acute lactic acidosis Most likely secondary to above IV fluids for rehydration  4 acute transaminitis Most likely multifactorial-exacerbated by above, but cannot exclude noted history of alcoholism with continued alcohol use, and fatty liver change on ultrasound Check viral hepatitis panel, check right upper quadrant ultrasound, repeat CMP in the morning  5 acute hyponatremia/hypochloremia Secondary  to nausea/vomiting/diarrhea IV fluids for rehydration  6 chronic alcoholism With continued alcohol use We will place on alcohol withdrawal protocol Cessation counseling given on admission  7 chronic GERD without esophagitis PPI daily  8 chronic benign essential hypertension Stable Continue home regiment  All the records are reviewed and case discussed with ED provider. Management plans discussed with the patient, family and they are in agreement.  CODE STATUS: Code Status History    Date Active Date Inactive Code Status Order ID Comments User Context   10/26/2015 16:16 10/28/2015 17:34 Full Code [id number]  Marguarite ArbourSparks, Jeffrey D, MD Inpatient   04/19/2012 17:24 04/21/2012 16:43 Full Code [id number]  Cindy HazyWhitaker, Joyce Scott, RN Inpatient       TOTAL TIME TAKING CARE OF THIS PATIENT: 45 minutes.    Evelena AsaMontell D Miran Kautzman M.D on 05/13/2017   Between 7am to 6pm - Pager - [phone]  After 6pm go to [email or web address] - password [email or web address]  [facility] [place] [facility]  Office  [phone]  CC: Primary care physician; Joaquim Namuncan, Graham S, MD   Note: This dictation was prepared with Dragon dictation along with smaller phrase technology. Any transcriptional errors that result from this process are unintentional.

## 2017-05-13 NOTE — ED Notes (Signed)
CODE  SEPSIS  CNL  PER  DR  Roxan HockeyOBINSON MD

## 2017-05-13 NOTE — ED Provider Notes (Addendum)
[facility] Emergency Department Provider Note    First MD Initiated Contact with Patient 05/13/17 1125     (approximate)  I have reviewed the triage vital signs and the nursing notes.   HISTORY  Chief Complaint Emesis    HPI Abigail [name] is a 57 y.o. female with a history of pancreatitis as well as alcoholism and status post cholecystectomy and appendectomy presents with 2 days of fever chills multiple episodes of vomiting and productive cough.  Had fever to 102 today.  Has states that her urine has been darker in color and more concentrated.  He also complains of mild to moderate headache that is constant.  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  No diarrhea.    Past Medical History:  Diagnosis Date  . Alcoholism (HCC)   . Cholecystitis   . GERD (gastroesophageal reflux disease)   . Hypertension   . Pancreatitis 10/2015   2013 alcohol and/or gallstone  . Photosensitivity    (No formal dx of lupus) [facility] [place]   Family History  Problem Relation Age of Onset  . Heart disease Mother        CAD, angioplasty twice with stents  . Cancer Mother        Breast  . Breast cancer Mother [id number]  . Hypertension Mother   . Peripheral Artery Disease Mother   . Hypertension Father   . Hypothyroidism Father   . Heart disease Father        MI at age 55  . Cancer Father        H/O renal CA  . Diabetes Sister   . Cancer Sister        Renal CA  . Colon cancer Neg Hx    Past Surgical History:  Procedure Laterality Date  . APPENDECTOMY     As a child  . CAT scan  11/00   wnl  . CHOLECYSTECTOMY  04/20/2012   Procedure: LAPAROSCOPIC CHOLECYSTECTOMY WITH INTRAOPERATIVE CHOLANGIOGRAM;  Surgeon: Robyne AskewPaul S Toth III, MD;  [facility];  Service: General;  Laterality: N/A;  . DOBUTAMINE STRESS ECHO  11/00   wnl  . DOPPLER ECHOCARDIOGRAPHY     Secondary to Redux, wnl  .  ESOPHAGOGASTRODUODENOSCOPY  8/01   Esophagitis [name]([place])  . FOOT SURGERY  04/2015   Pin in small toe  . 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 NSVD     X 2  . SBFT  08/01   wnl  . TONSILLECTOMY     As a child  . TUBAL LIGATION  Late 20's   Patient Active Problem List   Diagnosis Date Noted  . Sepsis (HCC) 05/13/2017  . History of acute pancreatitis 10/26/2015  . Other migraine without status migrainosus, not intractable 09/17/2015  . Sciatica 10/10/2014  . Routine general medical examination at a health care facility 06/05/2014  . Advance care planning 06/05/2014  . Pain in joint, shoulder region 07/19/2013  . History of alcohol abuse 09/13/2012  . Anxiety 04/10/2012  . B12 deficiency 01/31/2010  . ARTHRALGIA 01/31/2010  . Essential hypertension 08/16/2006  . ASTHMA, EXTRINSIC NOS 08/16/2006  . ESOPHAGITIS 08/16/2006  . GERD 08/16/2006      Prior to Admission medications   Medication Sig Start Date End Date Taking? Authorizing Provider  cyanocobalamin (,VITAMIN B-12,) 1000 MCG/ML injection Inject 1,000 mcg into the muscle every 14 (fourteen) days.  Yes [provider]  gabapentin (NEURONTIN) 300 MG capsule Take 1-2 capsules (300-600 mg total) by mouth 2 (two) times daily. 11/24/16  Yes Penumalli, Glenford Bayley, MD  meloxicam (MOBIC) 15 MG tablet Take 1 tablet (15 mg total) by mouth daily. 03/23/17  Yes Felecia Shelling, DPM  metoprolol tartrate (LOPRESSOR) 50 MG tablet TAKE 1 TABLET (50 MG TOTAL) BY MOUTH 2 (TWO) TIMES DAILY. 09/28/16  Yes Joaquim Nam, MD  pantoprazole (PROTONIX) 40 MG tablet Take 1 tablet (40 mg total) by mouth daily. 12/25/16  Yes Joaquim Nam, MD  ondansetron (ZOFRAN) 4 MG tablet TAKE 1-2 TABLETS BY MOUTH EVERY 8 HOURS AS NEEDED FOR NAUSEA OR VOMITING 03/07/17   Joaquim Nam, MD  traMADol (ULTRAM) 50 MG tablet Take 1 tablet (50 mg total) by mouth every 8 (eight) hours as needed. 03/23/17   Felecia Shelling, DPM    Allergies Latex;  Atorvastatin; Dilaudid [hydromorphone hcl]; Lexapro [escitalopram oxalate]; Lisinopril; Mirtazapine; Nsaids; and Tylenol [acetaminophen]    Social History Social History   Tobacco Use  . Smoking status: Never Smoker  . Smokeless tobacco: Never Used  Substance Use Topics  . Alcohol use: Yes    Alcohol/week: 1.8 oz    Types: 2 Glasses of wine, 1 Cans of beer per week    Comment: cvouple glsses of wine over weekdend and beer monday   . Drug use: No    Review of Systems Patient denies headaches, rhinorrhea, blurry vision, numbness, shortness of breath, chest pain, edema, cough, abdominal pain, nausea, vomiting, diarrhea, dysuria, fevers, rashes or hallucinations unless otherwise stated above in HPI. ____________________________________________   PHYSICAL EXAM:  VITAL SIGNS: Vitals:   05/13/17 1626 05/13/17 2003  BP: 101/68 104/65  Pulse: 83 71  Resp: 18 13  Temp: 98 F (36.7 C) 97.7 F (36.5 C)  SpO2: 98% 93%    Constitutional: Alert and oriented.ill appearing but in no acute distress. Eyes: Conjunctivae are normal.  Head: Atraumatic. Nose: No congestion/rhinnorhea. Mouth/Throat: Mucous membranes are moist.   Neck: No stridor. Painless ROM.  Cardiovascular: Normal rate, regular rhythm. Grossly normal heart sounds.  Good peripheral circulation. Respiratory: Normal respiratory effort.  No retractions. Lungs CTAB. Gastrointestinal: Soft and nontender. No distention. No abdominal bruits. No CVA tenderness. Genitourinary:  Musculoskeletal: No lower extremity tenderness nor edema.  No joint effusions. Neurologic:  Normal speech and language. No gross focal neurologic deficits are appreciated. No facial droop Skin:  Skin is warm, dry and intact. No rash noted. Psychiatric: Mood and affect are normal. Speech and behavior are normal.  ____________________________________________   LABS (all labs ordered are listed, but only abnormal results are displayed)  Results for  orders placed or performed during the hospital encounter of 05/13/17 (from the past 24 hour(s))  CBC with Differential/Platelet     Status: Abnormal   Collection Time: 05/13/17 11:37 AM  Result Value Ref Range   WBC 4.0 3.6 - 11.0 K/uL   RBC 4.73 3.80 - 5.20 MIL/uL   Hemoglobin 15.8 12.0 - 16.0 g/dL   HCT 16.1 09.6 - 04.5 %   MCV 97.4 80.0 - 100.0 fL   MCH 33.4 26.0 - 34.0 pg   MCHC 34.3 32.0 - 36.0 g/dL   RDW 40.9 81.1 - 91.4 %   Platelets 143 (L) 150 - 440 K/uL   Neutrophils Relative % 80 %   Neutro Abs 3.2 1.4 - 6.5 K/uL   Lymphocytes Relative 14 %   Lymphs Abs 0.6 (L) 1.0 -  3.6 K/uL   Monocytes Relative 5 %   Monocytes Absolute 0.2 0.2 - 0.9 K/uL   Eosinophils Relative 1 %   Eosinophils Absolute 0.0 0 - 0.7 K/uL   Basophils Relative 0 %   Basophils Absolute 0.0 0 - 0.1 K/uL  Comprehensive metabolic panel     Status: Abnormal   Collection Time: 05/13/17 11:37 AM  Result Value Ref Range   Sodium 132 (L) 135 - 145 mmol/L   Potassium 3.7 3.5 - 5.1 mmol/L   Chloride 97 (L) 101 - 111 mmol/L   CO2 17 (L) 22 - 32 mmol/L   Glucose, Bld 128 (H) 65 - 99 mg/dL   BUN 14 6 - 20 mg/dL   Creatinine, Ser 9.60 0.44 - 1.00 mg/dL   Calcium 8.8 (L) 8.9 - 10.3 mg/dL   Total Protein 7.8 6.5 - 8.1 g/dL   Albumin 3.5 3.5 - 5.0 g/dL   AST 454 (H) 15 - 41 U/L   ALT 63 (H) 14 - 54 U/L   Alkaline Phosphatase 56 38 - 126 U/L   Total Bilirubin 2.3 (H) 0.3 - 1.2 mg/dL   GFR calc non Af Amer >60 >60 mL/min   GFR calc Af Amer >60 >60 mL/min   Anion gap 18 (H) 5 - 15  Lipase, blood     Status: None   Collection Time: 05/13/17 11:37 AM  Result Value Ref Range   Lipase 22 11 - 51 U/L  Lactic acid, plasma     Status: Abnormal   Collection Time: 05/13/17 11:37 AM  Result Value Ref Range   Lactic Acid, Venous 2.8 (HH) 0.5 - 1.9 mmol/L  Influenza panel by PCR (type A & B)     Status: None   Collection Time: 05/13/17 11:37 AM  Result Value Ref Range   Influenza A By PCR NEGATIVE NEGATIVE   Influenza  B By PCR NEGATIVE NEGATIVE  Urinalysis, Complete w Microscopic     Status: Abnormal   Collection Time: 05/13/17  2:00 PM  Result Value Ref Range   Color, Urine AMBER (A) YELLOW   APPearance HAZY (A) CLEAR   Specific Gravity, Urine 1.025 1.005 - 1.030   pH 6.0 5.0 - 8.0   Glucose, UA NEGATIVE NEGATIVE mg/dL   Hgb urine dipstick SMALL (A) NEGATIVE   Bilirubin Urine NEGATIVE NEGATIVE   Ketones, ur 20 (A) NEGATIVE mg/dL   Protein, ur NEGATIVE NEGATIVE mg/dL   Nitrite POSITIVE (A) NEGATIVE   Leukocytes, UA MODERATE (A) NEGATIVE   RBC / HPF 0-5 0 - 5 RBC/hpf   WBC, UA 6-30 0 - 5 WBC/hpf   Bacteria, UA RARE (A) NONE SEEN   Squamous Epithelial / LPF 0-5 (A) NONE SEEN   Mucus PRESENT   Procalcitonin     Status: None   Collection Time: 05/13/17  4:41 PM  Result Value Ref Range   Procalcitonin 44.61 ng/mL   ____________________________________________ ____________________________________________  RADIOLOGY  I personally reviewed all radiographic images ordered to evaluate for the above acute complaints and reviewed radiology reports and findings.  These findings were personally discussed with the patient.  Please see medical record for radiology report.  ____________________________________________   PROCEDURES  Procedure(s) performed:  Procedures    Critical Care performed: no ____________________________________________   INITIAL IMPRESSION / ASSESSMENT AND PLAN / ED COURSE  Pertinent labs & imaging results that were available during my care of the patient were reviewed by me and considered in my medical decision making (see chart for  details).  DDX: ili, flu, sepsis, pna, enteritis, dehydration  BRIARROSE SHOR is a 58 y.o. who presents to the ED with symptoms as described above.  She is febrile and tachycardic nontoxic-appearing.  Symptoms have been ongoing for 2 days.  We will give IV fluids.  Will workup for sepsis though based on her symptoms I do suspect some component  of viral illness possible neurovirus.  No report of any diarrhea at this time.  The patient will be placed on continuous pulse oximetry and telemetry for monitoring.  Laboratory evaluation will be sent to evaluate for the above complaints.     Clinical Course as of May 13 2010  Thu May 13, 2017  1326 No source of infectious process at this time.  Will order CT imaging given her history of abdominal surgeries to evaluate for acute intra-abdominal process.  We will continue with IV fluids and resuscitation.  [PR]  1348 CT imaging shows possible early pancreatitis though lipase is normal.  This point do suspect some component of viral component.  Will continue with IV fluids.  Patient still has not made any urine suggesting significant component of dehydration.  Based on her symptoms I do believe patient would benefit from hospitalization for additional IV fluids and medical management.  I will hold Abx at this time as the symptoms and lymphocytic suppression would suggest viral process.  [PR]    Clinical Course User Index [PR] Willy Eddy, MD     ____________________________________________   FINAL CLINICAL IMPRESSION(S) / ED DIAGNOSES  Final diagnoses:  Sepsis, due to unspecified organism (HCC)  Nausea and vomiting, intractability of vomiting not specified, unspecified vomiting type  Dehydration      NEW MEDICATIONS STARTED DURING THIS VISIT:  Current Discharge Medication List       Note:  This document was prepared using Dragon voice recognition software and may include unintentional dictation errors.    Willy Eddy, MD 05/13/17 1351    Willy Eddy, MD 05/13/17 2012

## 2017-05-13 NOTE — ED Notes (Signed)
Date and time results received: 05/13/17 1137  Test: Lactic acid  Critical Value: 2.8  Name of Provider Notified: Dr. Roxan Hockeyobinson  Orders Received? Or Actions Taken?: Orders Received - See Orders for details

## 2017-05-13 NOTE — ED Triage Notes (Signed)
Has had vomiting for 2 days with minimal diarrhea. Fevers.  Only pain is headache.  Cannot take tylenol or motrin for fevers per report.  Unable to keep water down.   No abdominal pain other than soreness from vomiting.

## 2017-05-13 NOTE — Progress Notes (Signed)
   05/13/17 1810  Clinical Encounter Type  Visited With Patient  Visit Type Initial;Other (Comment) (advanced directive order)  Referral From Nurse  Consult/Referral To Chaplain  Spiritual Encounters  Spiritual Needs Emotional   Chaplain responded to order for advanced directive.  Chaplain provided education on document, offered emotional support.  Patient indicated that she would like to review document tonight and complete it tomorrow.  Chaplain encouraged her to page this chaplain if she had any additional questions.  This chaplain will refer patient to unit chaplain during Friday morning report

## 2017-05-13 NOTE — Progress Notes (Signed)
Patient received 1,000 CC NS bolus in the ED, asked ED nurse if Dr Katheren ShamsSalary wanted the additional  2,500 cc he had ordered to be given , she said he did .

## 2017-05-13 NOTE — ED Notes (Signed)
Pt is back from CT at this time.  

## 2017-05-14 ENCOUNTER — Inpatient Hospital Stay: Payer: BLUE CROSS/BLUE SHIELD

## 2017-05-14 LAB — RESPIRATORY PANEL BY PCR
Adenovirus: NOT DETECTED
BORDETELLA PERTUSSIS-RVPCR: NOT DETECTED
CHLAMYDOPHILA PNEUMONIAE-RVPPCR: NOT DETECTED
Coronavirus 229E: NOT DETECTED
Coronavirus HKU1: NOT DETECTED
Coronavirus NL63: NOT DETECTED
Coronavirus OC43: NOT DETECTED
INFLUENZA A H1 2009-RVPPR: NOT DETECTED
INFLUENZA A H1-RVPPCR: NOT DETECTED
Influenza A H3: NOT DETECTED
Influenza A: NOT DETECTED
Influenza B: NOT DETECTED
METAPNEUMOVIRUS-RVPPCR: NOT DETECTED
Mycoplasma pneumoniae: NOT DETECTED
PARAINFLUENZA VIRUS 2-RVPPCR: NOT DETECTED
PARAINFLUENZA VIRUS 3-RVPPCR: NOT DETECTED
Parainfluenza Virus 1: NOT DETECTED
Parainfluenza Virus 4: NOT DETECTED
RHINOVIRUS / ENTEROVIRUS - RVPPCR: NOT DETECTED
Respiratory Syncytial Virus: NOT DETECTED

## 2017-05-14 LAB — BLOOD CULTURE ID PANEL (REFLEXED)
Acinetobacter baumannii: NOT DETECTED
CANDIDA ALBICANS: NOT DETECTED
CANDIDA PARAPSILOSIS: NOT DETECTED
CANDIDA TROPICALIS: NOT DETECTED
CARBAPENEM RESISTANCE: NOT DETECTED
Candida glabrata: NOT DETECTED
Candida krusei: NOT DETECTED
ENTEROBACTER CLOACAE COMPLEX: NOT DETECTED
ENTEROCOCCUS SPECIES: NOT DETECTED
Enterobacteriaceae species: DETECTED — AB
Escherichia coli: DETECTED — AB
Haemophilus influenzae: NOT DETECTED
KLEBSIELLA PNEUMONIAE: NOT DETECTED
Klebsiella oxytoca: NOT DETECTED
Listeria monocytogenes: NOT DETECTED
Methicillin resistance: NOT DETECTED
Neisseria meningitidis: NOT DETECTED
PROTEUS SPECIES: NOT DETECTED
Pseudomonas aeruginosa: NOT DETECTED
SERRATIA MARCESCENS: NOT DETECTED
STAPHYLOCOCCUS AUREUS BCID: NOT DETECTED
STREPTOCOCCUS PNEUMONIAE: NOT DETECTED
Staphylococcus species: NOT DETECTED
Streptococcus agalactiae: NOT DETECTED
Streptococcus pyogenes: NOT DETECTED
Streptococcus species: NOT DETECTED
VANCOMYCIN RESISTANCE: NOT DETECTED

## 2017-05-14 LAB — URINALYSIS, COMPLETE (UACMP) WITH MICROSCOPIC
Bilirubin Urine: NEGATIVE
GLUCOSE, UA: NEGATIVE mg/dL
Ketones, ur: 20 mg/dL — AB
Leukocytes, UA: NEGATIVE
Nitrite: NEGATIVE
Protein, ur: NEGATIVE mg/dL
Specific Gravity, Urine: 1.008 (ref 1.005–1.030)
pH: 6 (ref 5.0–8.0)

## 2017-05-14 LAB — COMPREHENSIVE METABOLIC PANEL
ALK PHOS: 42 U/L (ref 38–126)
ALT: 50 U/L (ref 14–54)
AST: 98 U/L — AB (ref 15–41)
Albumin: 2.9 g/dL — ABNORMAL LOW (ref 3.5–5.0)
Anion gap: 8 (ref 5–15)
BUN: 9 mg/dL (ref 6–20)
CALCIUM: 7.8 mg/dL — AB (ref 8.9–10.3)
CO2: 24 mmol/L (ref 22–32)
CREATININE: 0.6 mg/dL (ref 0.44–1.00)
Chloride: 107 mmol/L (ref 101–111)
GFR calc Af Amer: >60 mL/min (ref >60)
Glucose, Bld: 82 mg/dL (ref 65–99)
Potassium: 3.5 mmol/L (ref 3.5–5.1)
Sodium: 139 mmol/L (ref 135–145)
TOTAL PROTEIN: 6.3 g/dL — AB (ref 6.5–8.1)
Total Bilirubin: 0.7 mg/dL (ref 0.3–1.2)

## 2017-05-14 LAB — CBC
HEMATOCRIT: 41.1 % (ref 35.0–47.0)
Hemoglobin: 13.7 g/dL (ref 12.0–16.0)
MCH: 32.9 pg (ref 26.0–34.0)
MCHC: 33.3 g/dL (ref 32.0–36.0)
MCV: 98.9 fL (ref 80.0–100.0)
Platelets: 95 10*3/uL — ABNORMAL LOW (ref 150–440)
RBC: 4.16 MIL/uL (ref 3.80–5.20)
RDW: 13.7 % (ref 11.5–14.5)
WBC: 4.2 10*3/uL (ref 3.6–11.0)

## 2017-05-14 LAB — C DIFFICILE QUICK SCREEN W PCR REFLEX
C DIFFICILE (CDIFF) INTERP: NOT DETECTED
C Diff antigen: NEGATIVE
C Diff toxin: NEGATIVE

## 2017-05-14 LAB — HEPATITIS PANEL, ACUTE
HCV Ab: <0.1 s/co ratio (ref 0.0–0.9)
HEP B S AG: NEGATIVE
Hep A IgM: NEGATIVE
Hep B C IgM: NEGATIVE

## 2017-05-14 LAB — HIV ANTIBODY (ROUTINE TESTING W REFLEX): HIV Screen 4th Generation wRfx: NONREACTIVE

## 2017-05-14 MED ORDER — SODIUM CHLORIDE 0.9 % IV SOLN
2.0000 g | Freq: Three times a day (TID) | INTRAVENOUS | Status: DC
  Administered 2017-05-14: 2 g via INTRAVENOUS
  Filled 2017-05-14 (×3): qty 2

## 2017-05-14 MED ORDER — POTASSIUM CHLORIDE CRYS ER 20 MEQ PO TBCR
40.0000 meq | EXTENDED_RELEASE_TABLET | Freq: Once | ORAL | Status: AC
  Administered 2017-05-14: 10:00:00 40 meq via ORAL
  Filled 2017-05-14: qty 2

## 2017-05-14 MED ORDER — SODIUM CHLORIDE 0.9 % IV SOLN
INTRAVENOUS | Status: DC
  Administered 2017-05-14 – 2017-05-15 (×2): via INTRAVENOUS

## 2017-05-14 MED ORDER — ALUM & MAG HYDROXIDE-SIMETH 200-200-20 MG/5ML PO SUSP
30.0000 mL | ORAL | Status: DC | PRN
  Administered 2017-05-14: 06:00:00 30 mL via ORAL
  Filled 2017-05-14: qty 30

## 2017-05-14 MED ORDER — APIXABAN 5 MG PO TABS
10.0000 mg | ORAL_TABLET | Freq: Two times a day (BID) | ORAL | Status: DC
  Administered 2017-05-14 – 2017-05-15 (×4): 10 mg via ORAL
  Filled 2017-05-14 (×5): qty 2

## 2017-05-14 MED ORDER — APIXABAN 5 MG PO TABS: 5.0000 mg | ORAL_TABLET | Freq: Two times a day (BID) | ORAL | Status: DC

## 2017-05-14 MED ORDER — SODIUM CHLORIDE 0.9 % IV SOLN
1.0000 g | Freq: Three times a day (TID) | INTRAVENOUS | Status: DC
  Administered 2017-05-14 – 2017-05-16 (×6): 1 g via INTRAVENOUS
  Filled 2017-05-14 (×8): qty 1

## 2017-05-14 NOTE — Progress Notes (Signed)
Pharmacy Antibiotic Note  Abigail [name] is a 57 y.o. female admitted on 05/13/2017 with E. coli bacteremia.  Pharmacy has been consulted for meropenem dosing.   Plan: Meropenem 1 g IV q8h  Height: 5' (152.4 cm) Weight: 198 lb 13.7 oz (90.2 kg) IBW/kg (Calculated) : 45.5  Temp (24hrs), Avg:100.2 F (37.9 C), Min:97.7 F (36.5 C), Max:103 F (39.4 C)  Recent Labs  Lab 05/13/17 1137 05/13/17 2309 05/14/17 0254  WBC 4.0  --  4.2  CREATININE 0.97  --  0.60  LATICACIDVEN 2.8* 1.1  --     Estimated Creatinine Clearance: 80.5 mL/min (by C-G formula based on SCr of 0.6 mg/dL).    Allergies  Allergen Reactions  . Latex Rash    "hands turn beat red and start to itch"  . Atorvastatin Other (See Comments)    Swollen joints  . Dilaudid [Hydromorphone Hcl] Itching and Other (See Comments)    Itching- no rash- likely side effect but not true allergy.  Tolerates oxycodone.   [name]. Lexapro [Escitalopram Oxalate] Other (See Comments)    Psychological changes  . Lisinopril Other (See Comments)    Hives, presumed allergy  . Mirtazapine Swelling    swelling  . Nsaids     Gastritis 2013  . Tylenol [Acetaminophen] Other (See Comments)    Hx pancreatitis    Antimicrobials this admission: meropenem 1/8 >>   Dose adjustments this admission:  Microbiology results: 2/7 BCx: 4/4 E.coli, sensitivities pending  Thank you for allowing pharmacy to be a part of this patient's care.  Cindi CarbonMary M Cranford Blessinger, PharmD, BCPS Clinical Pharmacist 05/14/2017 8:34 AM

## 2017-05-14 NOTE — Progress Notes (Signed)
Patient ID: Abigail [name], female   DOB: [birth year], 57 y.o.   MRN: [id number]  [facility] PROGRESS NOTE  Abigail [name] [id number] DOB: [birth year] DOA: 05/13/2017 PCP: Joaquim Namuncan, Graham S, MD  HPI/Subjective: Patient feeling better than when she came in.  For a few days she was having chills nausea vomiting unable to eat.  Initially she was constipated and had a little diarrhea.  No urinary symptoms.  Objective: Vitals:   05/14/17 1000 05/14/17 1216  BP: 105/75 114/62  Pulse: 68 (!) 59  Resp: 14 18  Temp: 98.3 F (36.8 C) 98 F (36.7 C)  SpO2: 96% 96%    Filed Weights   05/13/17 1125 05/13/17 1626  Weight: 76.7 kg (169 lb) 90.2 kg (198 lb 13.7 oz)    ROS: Review of Systems  Constitutional: Negative for chills and fever.  Eyes: Negative for blurred vision.  Respiratory: Negative for cough and shortness of breath.   Cardiovascular: Negative for chest pain.  Gastrointestinal: Positive for abdominal pain. Negative for constipation, diarrhea, nausea and vomiting.  Genitourinary: Negative for dysuria.  Musculoskeletal: Negative for joint pain.  Neurological: Negative for dizziness and headaches.   Exam: Physical Exam  Constitutional: She is oriented to person, place, and time.  HENT:  Nose: No mucosal edema.  Mouth/Throat: No oropharyngeal exudate or posterior oropharyngeal edema.  Eyes: Conjunctivae, EOM and lids are normal. Pupils are equal, round, and reactive to light.  Neck: No JVD present. Carotid bruit is not present. No edema present. No thyroid mass and no thyromegaly present.  Cardiovascular: S1 normal and S2 normal. Exam reveals no gallop.  No murmur heard. Pulses:      Dorsalis pedis pulses are 2+ on the right side, and 2+ on the left side.  Respiratory: No respiratory distress. She has no wheezes. She has no rhonchi. She has no rales.  GI: Soft. Bowel sounds are normal. There is no tenderness.  Musculoskeletal:       Right ankle: She exhibits swelling.        Left ankle: She exhibits swelling.  Lymphadenopathy:    She has no cervical adenopathy.  Neurological: She is alert and oriented to person, place, and time. No cranial nerve deficit.  Skin: Skin is warm. No rash noted. Nails show no clubbing.  Psychiatric: She has a normal mood and affect.      Data Reviewed: Basic Metabolic Panel: Recent Labs  Lab 05/13/17 1137 05/14/17 0254  NA 132* 139  K 3.7 3.5  CL 97* 107  CO2 17* 24  GLUCOSE 128* 82  BUN 14 9  CREATININE 0.97 0.60  CALCIUM 8.8* 7.8*   Liver Function Tests: Recent Labs  Lab 05/13/17 1137 05/14/17 0254  AST 133* 98*  ALT 63* 50  ALKPHOS 56 42  BILITOT 2.3* 0.7  PROT 7.8 6.3*  ALBUMIN 3.5 2.9*   Recent Labs  Lab 05/13/17 1137  LIPASE 22  CBC: Recent Labs  Lab 05/13/17 1137 05/14/17 0254  WBC 4.0 4.2  NEUTROABS 3.2  --   HGB 15.8 13.7  HCT 46.1 41.1  MCV 97.4 98.9  PLT 143* 95*     Recent Results (from the past 240 hour(s))  Respiratory Panel by PCR     Status: None   Collection Time: 05/13/17 11:37 AM  Result Value Ref Range Status   Adenovirus NOT DETECTED NOT DETECTED Final   Coronavirus 229E NOT DETECTED NOT DETECTED Final   Coronavirus HKU1 NOT DETECTED NOT DETECTED Final   Coronavirus  NL63 NOT DETECTED NOT DETECTED Final   Coronavirus OC43 NOT DETECTED NOT DETECTED Final   Metapneumovirus NOT DETECTED NOT DETECTED Final   Rhinovirus / Enterovirus NOT DETECTED NOT DETECTED Final   Influenza A NOT DETECTED NOT DETECTED Final   Influenza A H1 NOT DETECTED NOT DETECTED Final   Influenza A H1 2009 NOT DETECTED NOT DETECTED Final   Influenza A H3 NOT DETECTED NOT DETECTED Final   Influenza B NOT DETECTED NOT DETECTED Final   Parainfluenza Virus 1 NOT DETECTED NOT DETECTED Final   Parainfluenza Virus 2 NOT DETECTED NOT DETECTED Final   Parainfluenza Virus 3 NOT DETECTED NOT DETECTED Final   Parainfluenza Virus 4 NOT DETECTED NOT DETECTED Final   Respiratory Syncytial Virus NOT  DETECTED NOT DETECTED Final   Bordetella pertussis NOT DETECTED NOT DETECTED Final   Chlamydophila pneumoniae NOT DETECTED NOT DETECTED Final   Mycoplasma pneumoniae NOT DETECTED NOT DETECTED Final  Culture, blood (x 2)     Status: None (Preliminary result)   Collection Time: 05/13/17 11:37 AM  Result Value Ref Range Status   Specimen Description   Final    BLOOD BLOOD LEFT WRIST Performed at [facility], [address], [place], [place] [address]    Special Requests   Final    BOTTLES DRAWN AEROBIC AND ANAEROBIC Blood Culture adequate volume Performed at [facility], [address] [place], [place] [address]    Culture  Setup Time   Final    GRAM NEGATIVE RODS IN BOTH AEROBIC AND ANAEROBIC BOTTLES CRITICAL RESULT CALLED TO, READ BACK BY AND VERIFIED WITH: Susy Frizzle [facility] AT 0981 05/14/17 SDR Performed at [facility], [address] [place], [place] [address]    Culture GRAM NEGATIVE RODS  Final   Report Status PENDING  Incomplete  Culture, blood (x 2)     Status: None (Preliminary result)   Collection Time: 05/13/17 11:37 AM  Result Value Ref Range Status   Specimen Description   Final    BLOOD BLOOD LEFT HAND Performed at [facility], [address] [place], [place] [address]    Special Requests   Final    BOTTLES DRAWN AEROBIC AND ANAEROBIC Blood Culture adequate volume   Culture  Setup Time   Final    GRAM NEGATIVE RODS IN BOTH AEROBIC AND ANAEROBIC BOTTLES CRITICAL VALUE NOTED.  VALUE IS CONSISTENT WITH PREVIOUSLY REPORTED AND CALLED VALUE. Performed at [facility], [address] [place], [place] [address]    Culture GRAM NEGATIVE RODS  Final   Report Status PENDING  Incomplete  Blood Culture ID Panel (Reflexed)     Status: Abnormal   Collection Time: 05/13/17 11:37 AM  Result Value Ref Range Status   Enterococcus species NOT DETECTED NOT DETECTED Final   Vancomycin resistance NOT DETECTED NOT DETECTED Final   Listeria  monocytogenes NOT DETECTED NOT DETECTED Final   Staphylococcus species NOT DETECTED NOT DETECTED Final   Staphylococcus aureus NOT DETECTED NOT DETECTED Final   Methicillin resistance NOT DETECTED NOT DETECTED Final   Streptococcus species NOT DETECTED NOT DETECTED Final   Streptococcus agalactiae NOT DETECTED NOT DETECTED Final   Streptococcus pneumoniae NOT DETECTED NOT DETECTED Final   Streptococcus pyogenes NOT DETECTED NOT DETECTED Final   Acinetobacter baumannii NOT DETECTED NOT DETECTED Final   Enterobacteriaceae species DETECTED (A) NOT DETECTED Final    Comment: CRITICAL RESULT CALLED TO, READ BACK BY AND VERIFIED WITH: MATT MCBANE AT 6578 05/14/17  SDR Enterobacteriaceae represent a large family of gram-negative bacteria, not a single organism.    Enterobacter cloacae complex NOT DETECTED NOT DETECTED Final   Escherichia coli DETECTED (A) NOT DETECTED Final    Comment: CRITICAL RESULT CALLED TO, READ BACK BY AND VERIFIED WITH:  MATT MCBANE AT 0535 05/14/17 SDR    Klebsiella oxytoca NOT DETECTED NOT DETECTED Final   Klebsiella pneumoniae NOT DETECTED NOT DETECTED Final   Proteus species NOT DETECTED NOT DETECTED Final   Serratia marcescens NOT DETECTED NOT DETECTED Final   Carbapenem resistance NOT DETECTED NOT DETECTED Final   Haemophilus influenzae NOT DETECTED NOT DETECTED Final   Neisseria meningitidis NOT DETECTED NOT DETECTED Final   Pseudomonas aeruginosa NOT DETECTED NOT DETECTED Final   Candida albicans NOT DETECTED NOT DETECTED Final   Candida glabrata NOT DETECTED NOT DETECTED Final   Candida krusei NOT DETECTED NOT DETECTED Final   Candida parapsilosis NOT DETECTED NOT DETECTED Final   Candida tropicalis NOT DETECTED NOT DETECTED Final    Comment: Performed at [facility], [address] [place], [place] [address]  Gastrointestinal Panel by PCR , Stool     Status: None   Collection Time: 05/13/17  7:54 PM  Result Value Ref Range Status   Campylobacter  species NOT DETECTED NOT DETECTED Final   Plesimonas shigelloides NOT DETECTED NOT DETECTED Final   Salmonella species NOT DETECTED NOT DETECTED Final   Yersinia enterocolitica NOT DETECTED NOT DETECTED Final   Vibrio species NOT DETECTED NOT DETECTED Final   Vibrio cholerae NOT DETECTED NOT DETECTED Final   Enteroaggregative E coli (EAEC) NOT DETECTED NOT DETECTED Final   Enteropathogenic E coli (EPEC) NOT DETECTED NOT DETECTED Final   Enterotoxigenic E coli (ETEC) NOT DETECTED NOT DETECTED Final   Shiga like toxin producing E coli (STEC) NOT DETECTED NOT DETECTED Final   Shigella/Enteroinvasive E coli (EIEC) NOT DETECTED NOT DETECTED Final   Cryptosporidium NOT DETECTED NOT DETECTED Final   Cyclospora cayetanensis NOT DETECTED NOT DETECTED Final   Entamoeba histolytica NOT DETECTED NOT DETECTED Final   Giardia lamblia NOT DETECTED NOT DETECTED Final   Adenovirus F40/41 NOT DETECTED NOT DETECTED Final   Astrovirus NOT DETECTED NOT DETECTED Final   Norovirus GI/GII NOT DETECTED NOT DETECTED Final   Rotavirus A NOT DETECTED NOT DETECTED Final   Sapovirus (I, II, IV, and V) NOT DETECTED NOT DETECTED Final    Comment: Performed at [facility], [address] [place], [place] [address]  C difficile quick scan w PCR reflex     Status: None   Collection Time: 05/13/17  7:54 PM  Result Value Ref Range Status   C Diff antigen NEGATIVE NEGATIVE Final   C Diff toxin NEGATIVE NEGATIVE Final   C Diff interpretation No C. difficile detected.  Final    Comment: Performed at [facility], [address] [place], [place] [address]     Studies: Dg Chest 2 View  Result Date: 05/13/2017 CLINICAL DATA:  Nausea and vomiting, fever EXAM: CHEST  2 VIEW COMPARISON:  07/13/2016 FINDINGS: Hypoventilation with mild atelectasis in the bases. Negative for heart failure or pneumonia. No pleural effusion IMPRESSION: Hypoventilation with mild bibasilar atelectasis. Electronically Signed   By:  Marlan Palau M.D.   On: 05/13/2017 12:44   Ct Abdomen Pelvis W Contrast  Result Date: 05/13/2017 CLINICAL DATA:  Epigastric pain for the last week or more. Nausea and vomiting. Elevated liver enzymes. History of pancreatitis. EXAM: CT ABDOMEN AND PELVIS  WITH CONTRAST TECHNIQUE: Multidetector CT imaging of the abdomen and pelvis was performed using the standard protocol following bolus administration of intravenous contrast. CONTRAST:  ISOVUE-300 IOPAMIDOL (ISOVUE-300) INJECTION 61% COMPARISON:  10/26/2015 FINDINGS: Lower chest: Lung bases are clear.  No pleural or pericardial fluid. Hepatobiliary: Mild diffuse fatty change of the liver. 5 mm low-density in the ventral right lobe is unchanged and benign. No other focal liver finding. Previous cholecystectomy. Pancreas: Mild inflammatory change of the fat adjacent to the head of the pancreas. No advanced pancreatitis. No pseudocyst. No visible ductal stone. Spleen: Normal Adrenals/Urinary Tract: Adrenal glands are normal. Kidneys are normal. Bladder is normal. Stomach/Bowel: Previous Roux-en-Y gastric bypass surgery. No sign of obstruction or significant bowel finding. Vascular/Lymphatic: Aorta is normal. IVC is normal. No retroperitoneal adenopathy. Reproductive: Normal Other: No free fluid or air. Musculoskeletal: Ordinary lower lumbar degenerative changes. IMPRESSION: Question early inflammatory change in the pancreatic head region. No advanced diffuse pancreatitis as was seen previously. Previous cholecystectomy.  Mild fatty change of the liver. Previous Roux-en-Y gastric bypass without evidence of obstruction or other complication. Electronically Signed   By: Paulina Fusi M.D.   On: 05/13/2017 13:37   US Liver Doppler  Result Date: 05/14/2017 CLINICAL DATA:  57 year old female with a history of transaminitis EXAM: DUPLEX ULTRASOUND OF LIVER TECHNIQUE: Color and duplex Doppler ultrasound was performed to evaluate the hepatic in-flow and out-flow  vessels. COMPARISON:  CT 05/13/2017 FINDINGS: Portal Vein Velocities Main:  30 cm/sec Right:  6.0 cm/sec Left:  11 cm/sec Hepatic Vein Velocities Right:  14 cm/sec Middle:  38 cm/sec Left:  19 cm/sec Hepatic Artery Velocity:  71 cm/sec Splenic Vein Velocity:  24 cm/sec Varices: Absent Ascites: Absent Nonocclusive portal vein thrombus at the splenic vein/portal vein confluence. IMPRESSION: Sonographic survey demonstrates nonocclusive portal vein thrombus at the splenic vein/portal vein confluence. Hepatopetal portal flow. Unremarkable hepatic artery waveform. Signed, Yvone Neu. Loreta Ave, DO [facility] Electronically Signed   By: Gilmer Mor D.O.   On: 05/14/2017 10:49    Scheduled Meds: . acidophilus  1 capsule Oral TID WC  . cyanocobalamin  1,000 mcg Intramuscular Q14 Days  . folic acid  1 mg Oral Daily  . gabapentin  300 mg Oral QHS  . gabapentin  600 mg Oral q morning - 10a  . meloxicam  15 mg Oral Daily  . metoprolol tartrate  50 mg Oral BID  . multivitamin with minerals  1 tablet Oral Daily  . pantoprazole  40 mg Oral Daily  . thiamine  100 mg Oral Daily   Or  . thiamine  100 mg Intravenous Daily   Continuous Infusions: . sodium chloride 50 mL/hr at 05/14/17 1020  . meropenem (MERREM) IV      Assessment/Plan:  1. Sepsis with E. coli in the blood.  Patient on meropenem.  She is feeling a little bit better.  Advance diet.  Continue gentle IV fluids. 2. Nonocclusive portal vein thrombosis.  Start Eliquis.  Pharmacy consult. 3. Lactic acidosis secondary to sepsis.  Continue IV fluids. 4. Hyponatremia and hypokalemia.  Sodium improved with IV fluid hydration.  Replace potassium orally. 5. History of pancreatitis in the past.  Lipase on this admission normal 6. Essential hypertension on metoprolol 7. Neuropathy on gabapentin 8. Thrombocytopenia likely secondary to sepsis.  Continue to monitor closely.  Code Status:     Code  Status Orders  (From admission, onward)        Start  Ordered   05/13/17 1628  Full code  Continuous     05/13/17 1628    Code Status History    Date Active Date Inactive Code Status Order ID Comments User Context   10/26/2015 16:16 10/28/2015 17:34 Full Code [id number]  Marguarite Arbour, MD Inpatient   04/19/2012 17:24 04/21/2012 16:43 Full Code [id number]  Cindy Hazy, RN Inpatient     Disposition Plan: We will need a few days of IV antibiotics and hopefully switch over to oral upon disposition  Antibiotics:  Meropenem  Time spent: 28 minutes  Xoey Warmoth [employer]

## 2017-05-14 NOTE — Progress Notes (Signed)
PHARMACY - PHYSICIAN COMMUNICATION CRITICAL VALUE ALERT - BLOOD CULTURE IDENTIFICATION (BCID)  Damaris HippoSandra G Dufrane is an 57 y.o. female who [facility] with a chief complaint of   Assessment:  BCID 4/4 E. coli. KPC (-) (include suspected source if known)  Name of physician (or Provider) Contacted: Sheryle Hailiamond  Current antibiotics:   Changes to prescribed antibiotics recommended:  Started meropenem per nomogram  No results found for this or any previous visit.  Hayward Rylander S 05/14/2017  5:43 AM

## 2017-05-14 NOTE — Progress Notes (Signed)
ANTICOAGULATION CONSULT NOTE - Initial Consult  Pharmacy Consult for apixaban Indication: portal vein thrombosis  Allergies  Allergen Reactions  . Latex Rash    "hands turn beat red and start to itch"  . Atorvastatin Other (See Comments)    Swollen joints  . Dilaudid [Hydromorphone Hcl] Itching and Other (See Comments)    Itching- no rash- likely side effect but not true allergy.  Tolerates oxycodone.   [name]. Lexapro [Escitalopram Oxalate] Other (See Comments)    Psychological changes  . Lisinopril Other (See Comments)    Hives, presumed allergy  . Mirtazapine Swelling    swelling  . Nsaids     Gastritis 2013  . Tylenol [Acetaminophen] Other (See Comments)    Hx pancreatitis    Patient Measurements: Height: 5' (152.4 cm) Weight: 198 lb 13.7 oz (90.2 kg) IBW/kg (Calculated) : 45.5  Vital Signs: Temp: 98 F (36.7 C) (02/08 1216) Temp Source: Oral (02/08 1216) BP: 114/62 (02/08 1216) Pulse Rate: 59 (02/08 1216)  Labs: Recent Labs    05/13/17 1137 05/14/17 0254  HGB 15.8 13.7  HCT 46.1 41.1  PLT 143* 95*  CREATININE 0.97 0.60    Estimated Creatinine Clearance: 80.5 mL/min (by C-G formula based on SCr of 0.6 mg/dL).   Medical History: Past Medical History:  Diagnosis Date  . Alcoholism (HCC)   . Cholecystitis   . GERD (gastroesophageal reflux disease)   . Hypertension   . Pancreatitis 10/2015   2013 alcohol and/or gallstone  . Photosensitivity    (No formal dx of lupus) [facility] [place]   Assessment: Pharmacy consulted to dose and monitor apixaban for portal vein thrombosis.    Plan:  Apixaban 10 mg PO BID x 7 days followed by apixaban 5 mg PO BID. CBC and SCr to be checked at least every 72 hours while inpatient.  Cindi CarbonMary M Mada Sadik, PharmD, BCPS Clinical Pharmacist 05/14/2017,3:12 PM

## 2017-05-15 LAB — CBC
HCT: 38.2 % (ref 35.0–47.0)
Hemoglobin: 12.9 g/dL (ref 12.0–16.0)
MCH: 33.1 pg (ref 26.0–34.0)
MCHC: 33.7 g/dL (ref 32.0–36.0)
MCV: 98.3 fL (ref 80.0–100.0)
PLATELETS: 110 10*3/uL — AB (ref 150–440)
RBC: 3.89 MIL/uL (ref 3.80–5.20)
RDW: 13.5 % (ref 11.5–14.5)
WBC: 4.3 10*3/uL (ref 3.6–11.0)

## 2017-05-15 LAB — BASIC METABOLIC PANEL
Anion gap: 9 (ref 5–15)
BUN: 6 mg/dL (ref 6–20)
CALCIUM: 8.1 mg/dL — AB (ref 8.9–10.3)
CO2: 22 mmol/L (ref 22–32)
CREATININE: 0.62 mg/dL (ref 0.44–1.00)
Chloride: 106 mmol/L (ref 101–111)
GFR calc Af Amer: >60 mL/min (ref >60)
GFR calc non Af Amer: >60 mL/min (ref >60)
Glucose, Bld: 89 mg/dL (ref 65–99)
Potassium: 3.4 mmol/L — ABNORMAL LOW (ref 3.5–5.1)
SODIUM: 137 mmol/L (ref 135–145)

## 2017-05-15 LAB — URINE CULTURE: CULTURE: NO GROWTH

## 2017-05-15 NOTE — Progress Notes (Signed)
Patient ID: Abigail [name], female   DOB: [birth year], 57 y.o.   MRN: [id number]  [facility] PROGRESS NOTE  TIKIA SKILTON WUJ:[id number] DOB: 04-Jul-1960 DOA: 05/13/2017 PCP: Joaquim Nam, MD  HPI/Subjective:  Patient feels better.  No nausea/vomiting/abdominal pain.  Afebrile.  No dysuria.  No history of clots.   Objective: Vitals:   05/15/17 0911 05/15/17 1346  BP: (!) 144/86 (!) 147/81  Pulse: 82 63  Resp:    Temp:  98.5 F (36.9 C)  SpO2:  93%    Filed Weights   05/13/17 1125 05/13/17 1626  Weight: 76.7 kg (169 lb) 90.2 kg (198 lb 13.7 oz)    ROS: Review of Systems  Constitutional: Negative for chills and fever.  Eyes: Negative for blurred vision.  Respiratory: Negative for cough and shortness of breath.   Cardiovascular: Negative for chest pain.  Gastrointestinal: Negative for constipation, diarrhea, nausea and vomiting.  Genitourinary: Negative for dysuria.  Musculoskeletal: Negative for joint pain.  Neurological: Negative for dizziness and headaches.   Exam: Physical Exam  Constitutional: She is oriented to person, place, and time.  HENT:  Nose: No mucosal edema.  Mouth/Throat: No oropharyngeal exudate or posterior oropharyngeal edema.  Eyes: Conjunctivae, EOM and lids are normal. Pupils are equal, round, and reactive to light.  Neck: No JVD present. Carotid bruit is not present. No edema present. No thyroid mass and no thyromegaly present.  Cardiovascular: S1 normal and S2 normal. Exam reveals no gallop.  No murmur heard. Pulses:      Dorsalis pedis pulses are 2+ on the right side, and 2+ on the left side.  Respiratory: No respiratory distress. She has no wheezes. She has no rhonchi. She has no rales.  GI: Soft. Bowel sounds are normal. There is no tenderness.  Musculoskeletal:       Right ankle: She exhibits swelling.       Left ankle: She exhibits swelling.  Lymphadenopathy:    She has no cervical adenopathy.  Neurological: She is alert and  oriented to person, place, and time. No cranial nerve deficit.  Skin: Skin is warm. No rash noted. Nails show no clubbing.  Psychiatric: She has a normal mood and affect.      Data Reviewed: Basic Metabolic Panel: Recent Labs  Lab 05/13/17 1137 05/14/17 0254 05/15/17 0547  NA 132* 139 137  K 3.7 3.5 3.4*  CL 97* 107 106  CO2 17* 24 22  GLUCOSE 128* 82 89  BUN 14 9 6   CREATININE 0.97 0.60 0.62  CALCIUM 8.8* 7.8* 8.1*   Liver Function Tests: Recent Labs  Lab 05/13/17 1137 05/14/17 0254  AST 133* 98*  ALT 63* 50  ALKPHOS 56 42  BILITOT 2.3* 0.7  PROT 7.8 6.3*  ALBUMIN 3.5 2.9*   Recent Labs  Lab 05/13/17 1137  LIPASE 22  CBC: Recent Labs  Lab 05/13/17 1137 05/14/17 0254 05/15/17 0547  WBC 4.0 4.2 4.3  NEUTROABS 3.2  --   --   HGB 15.8 13.7 12.9  HCT 46.1 41.1 38.2  MCV 97.4 98.9 98.3  PLT 143* 95* 110*     Recent Results (from the past 240 hour(s))  Respiratory Panel by PCR     Status: None   Collection Time: 05/13/17 11:37 AM  Result Value Ref Range Status   Adenovirus NOT DETECTED NOT DETECTED Final   Coronavirus 229E NOT DETECTED NOT DETECTED Final   Coronavirus HKU1 NOT DETECTED NOT DETECTED Final   Coronavirus NL63 NOT DETECTED  NOT DETECTED Final   Coronavirus OC43 NOT DETECTED NOT DETECTED Final   Metapneumovirus NOT DETECTED NOT DETECTED Final   Rhinovirus / Enterovirus NOT DETECTED NOT DETECTED Final   Influenza A NOT DETECTED NOT DETECTED Final   Influenza A H1 NOT DETECTED NOT DETECTED Final   Influenza A H1 2009 NOT DETECTED NOT DETECTED Final   Influenza A H3 NOT DETECTED NOT DETECTED Final   Influenza B NOT DETECTED NOT DETECTED Final   Parainfluenza Virus 1 NOT DETECTED NOT DETECTED Final   Parainfluenza Virus 2 NOT DETECTED NOT DETECTED Final   Parainfluenza Virus 3 NOT DETECTED NOT DETECTED Final   Parainfluenza Virus 4 NOT DETECTED NOT DETECTED Final   Respiratory Syncytial Virus NOT DETECTED NOT DETECTED Final   Bordetella  pertussis NOT DETECTED NOT DETECTED Final   Chlamydophila pneumoniae NOT DETECTED NOT DETECTED Final   Mycoplasma pneumoniae NOT DETECTED NOT DETECTED Final  Culture, blood (x 2)     Status: Abnormal (Preliminary result)   Collection Time: 05/13/17 11:37 AM  Result Value Ref Range Status   Specimen Description   Final    BLOOD BLOOD LEFT WRIST Performed at [facility], [address] [place], [place] [address]    Special Requests   Final    BOTTLES DRAWN AEROBIC AND ANAEROBIC Blood Culture adequate volume Performed at [facility], [address] [place], [place] [address]    Culture  Setup Time   Final    GRAM NEGATIVE RODS IN BOTH AEROBIC AND ANAEROBIC BOTTLES CRITICAL RESULT CALLED TO, READ BACK BY AND VERIFIED WITH: MATT MCBANE AT 0981 05/14/17 SDR    Culture (A)  Final    ESCHERICHIA COLI SUSCEPTIBILITIES TO FOLLOW Performed at [facility], [address], [place], [place] [address]    Report Status PENDING  Incomplete  Culture, blood (x 2)     Status: Abnormal (Preliminary result)   Collection Time: 05/13/17 11:37 AM  Result Value Ref Range Status   Specimen Description   Final    BLOOD BLOOD LEFT HAND Performed at [facility], [address] [place], [place] [address]    Special Requests   Final    BOTTLES DRAWN AEROBIC AND ANAEROBIC Blood Culture adequate volume Performed at [facility], [address] [place], [place] [address]    Culture  Setup Time   Final    GRAM NEGATIVE RODS IN BOTH AEROBIC AND ANAEROBIC BOTTLES CRITICAL VALUE NOTED.  VALUE IS CONSISTENT WITH PREVIOUSLY REPORTED AND CALLED VALUE. Performed at [facility], [address] [place], [place] [address]    Culture ESCHERICHIA COLI (A)  Final   Report Status PENDING  Incomplete  Blood Culture ID Panel (Reflexed)     Status: Abnormal   Collection Time: 05/13/17 11:37 AM  Result Value Ref Range Status   Enterococcus species NOT DETECTED NOT  DETECTED Final   Vancomycin resistance NOT DETECTED NOT DETECTED Final   Listeria monocytogenes NOT DETECTED NOT DETECTED Final   Staphylococcus species NOT DETECTED NOT DETECTED Final   Staphylococcus aureus NOT DETECTED NOT DETECTED Final   Methicillin resistance NOT DETECTED NOT DETECTED Final   Streptococcus species NOT DETECTED NOT DETECTED Final   Streptococcus agalactiae NOT DETECTED NOT DETECTED Final   Streptococcus pneumoniae NOT DETECTED NOT DETECTED Final   Streptococcus pyogenes NOT DETECTED NOT DETECTED Final   Acinetobacter baumannii NOT DETECTED NOT DETECTED Final   Enterobacteriaceae species DETECTED (A) NOT DETECTED Final  Comment: CRITICAL RESULT CALLED TO, READ BACK BY AND VERIFIED WITH: MATT MCBANE AT 9147 05/14/17 SDR Enterobacteriaceae represent a large family of gram-negative bacteria, not a single organism.    Enterobacter cloacae complex NOT DETECTED NOT DETECTED Final   Escherichia coli DETECTED (A) NOT DETECTED Final    Comment: CRITICAL RESULT CALLED TO, READ BACK BY AND VERIFIED WITH:  MATT MCBANE AT 0535 05/14/17 SDR    Klebsiella oxytoca NOT DETECTED NOT DETECTED Final   Klebsiella pneumoniae NOT DETECTED NOT DETECTED Final   Proteus species NOT DETECTED NOT DETECTED Final   Serratia marcescens NOT DETECTED NOT DETECTED Final   Carbapenem resistance NOT DETECTED NOT DETECTED Final   Haemophilus influenzae NOT DETECTED NOT DETECTED Final   Neisseria meningitidis NOT DETECTED NOT DETECTED Final   Pseudomonas aeruginosa NOT DETECTED NOT DETECTED Final   Candida albicans NOT DETECTED NOT DETECTED Final   Candida glabrata NOT DETECTED NOT DETECTED Final   Candida krusei NOT DETECTED NOT DETECTED Final   Candida parapsilosis NOT DETECTED NOT DETECTED Final   Candida tropicalis NOT DETECTED NOT DETECTED Final    Comment: Performed at [facility] [address] [place], [place] [address]  Gastrointestinal Panel by PCR , Stool     Status: None    Collection Time: 05/13/17  7:54 PM  Result Value Ref Range Status   Campylobacter species NOT DETECTED NOT DETECTED Final   Plesimonas shigelloides NOT DETECTED NOT DETECTED Final   Salmonella species NOT DETECTED NOT DETECTED Final   Yersinia enterocolitica NOT DETECTED NOT DETECTED Final   Vibrio species NOT DETECTED NOT DETECTED Final   Vibrio cholerae NOT DETECTED NOT DETECTED Final   Enteroaggregative E coli (EAEC) NOT DETECTED NOT DETECTED Final   Enteropathogenic E coli (EPEC) NOT DETECTED NOT DETECTED Final   Enterotoxigenic E coli (ETEC) NOT DETECTED NOT DETECTED Final   Shiga like toxin producing E coli (STEC) NOT DETECTED NOT DETECTED Final   Shigella/Enteroinvasive E coli (EIEC) NOT DETECTED NOT DETECTED Final   Cryptosporidium NOT DETECTED NOT DETECTED Final   Cyclospora cayetanensis NOT DETECTED NOT DETECTED Final   Entamoeba histolytica NOT DETECTED NOT DETECTED Final   Giardia lamblia NOT DETECTED NOT DETECTED Final   Adenovirus F40/41 NOT DETECTED NOT DETECTED Final   Astrovirus NOT DETECTED NOT DETECTED Final   Norovirus GI/GII NOT DETECTED NOT DETECTED Final   Rotavirus A NOT DETECTED NOT DETECTED Final   Sapovirus (I, II, IV, and V) NOT DETECTED NOT DETECTED Final    Comment: Performed at [facility], [address] [place], [place] [address]  C difficile quick scan w PCR reflex     Status: None   Collection Time: 05/13/17  7:54 PM  Result Value Ref Range Status   C Diff antigen NEGATIVE NEGATIVE Final   C Diff toxin NEGATIVE NEGATIVE Final   C Diff interpretation No C. difficile detected.  Final    Comment: Performed at [facility], [address] [place], [place] [address]  Urine Culture     Status: None   Collection Time: 05/14/17 10:19 AM  Result Value Ref Range Status   Specimen Description   Final    URINE, CLEAN CATCH Performed at [facility], [address], [place], [place] [address]    Culture   Final    NO  GROWTH Performed at [facility], [address] [place], [place] [address]    Report Status 05/15/2017 FINAL  Final  Studies: [facility] Liver Doppler  Result Date: 05/14/2017 CLINICAL DATA:  57 year old female with a history of transaminitis EXAM: DUPLEX ULTRASOUND OF LIVER TECHNIQUE: Color and duplex Doppler ultrasound was performed to evaluate the hepatic in-flow and out-flow vessels. COMPARISON:  CT 05/13/2017 FINDINGS: Portal Vein Velocities Main:  30 cm/sec Right:  6.0 cm/sec Left:  11 cm/sec Hepatic Vein Velocities Right:  14 cm/sec Middle:  38 cm/sec Left:  19 cm/sec Hepatic Artery Velocity:  71 cm/sec Splenic Vein Velocity:  24 cm/sec Varices: Absent Ascites: Absent Nonocclusive portal vein thrombus at the splenic vein/portal vein confluence. IMPRESSION: Sonographic survey demonstrates nonocclusive portal vein thrombus at the splenic vein/portal vein confluence. Hepatopetal portal flow. Unremarkable hepatic artery waveform. Signed, Yvone NeuJaime S. Loreta AveWagner, DO [facility] Electronically Signed   By: Gilmer MorJaime  Wagner D.O.   On: 05/14/2017 10:49    Scheduled Meds: . acidophilus  1 capsule Oral TID WC  . apixaban  10 mg Oral BID   Followed by  . [START ON 05/21/2017] apixaban  5 mg Oral BID  . cyanocobalamin  1,000 mcg Intramuscular Q14 Days  . folic acid  1 mg Oral Daily  . gabapentin  300 mg Oral QHS  . gabapentin  600 mg Oral q morning - 10a  . meloxicam  15 mg Oral Daily  . metoprolol tartrate  50 mg Oral BID  . multivitamin with minerals  1 tablet Oral Daily  . pantoprazole  40 mg Oral Daily  . thiamine  100 mg Oral Daily   Or  . thiamine  100 mg Intravenous Daily   Continuous Infusions: . meropenem (MERREM) IV Stopped (05/15/17 1513)    Assessment/Plan:  1. Sepsis with E. coli in the blood.  Patient on meropenem.   Sens pending. Source likely urine 2. Nonocclusive portal vein thrombosis.  Started Eliquis.  Could be due  to h/o pancreatitis. Will refer to oncology after discharge 3. Lactic acidosis secondary to sepsis.  Continue IV fluids. 4. Hyponatremia and hypokalemia.  Sodium improved with IV fluid hydration.  Replace potassium orally. 5. History of pancreatitis in the past.  Lipase on this admission normal 6. Essential hypertension on metoprolol 7. Neuropathy on gabapentin 8. Thrombocytopenia likely secondary to sepsis.  Improving  Code Status:     Code Status Orders  (From admission, onward)        Start     Ordered   05/13/17 1628  Full code  Continuous     05/13/17 1628    Code Status History    Date Active Date Inactive Code Status Order ID Comments User Context   10/26/2015 16:16 10/28/2015 17:34 Full Code [id number]  Marguarite ArbourSparks, Jeffrey D, MD Inpatient   04/19/2012 17:24 04/21/2012 16:43 Full Code [id number]  Cindy HazyWhitaker, Joyce Scott, RN Inpatient     Disposition Plan: We will need a few days of IV antibiotics and hopefully switch over to oral upon disposition  Antibiotics:  Meropenem  Time spent: 28 minutes  Hinda Lindor R Marne Meline  [employer]

## 2017-05-16 LAB — CULTURE, BLOOD (ROUTINE X 2)
Special Requests: ADEQUATE
Special Requests: ADEQUATE

## 2017-05-16 MED ORDER — APIXABAN 5 MG PO TABS: 5.0000 mg | ORAL_TABLET | Freq: Two times a day (BID) | ORAL | 0 refills | Status: DC

## 2017-05-16 MED ORDER — CIPROFLOXACIN HCL 500 MG PO TABS: 500.0000 mg | ORAL_TABLET | Freq: Two times a day (BID) | ORAL | 0 refills | Status: AC

## 2017-05-16 NOTE — Discharge Instructions (Signed)
Resume diet and activity as before ° ° °

## 2017-05-16 NOTE — Progress Notes (Signed)
Pt D/C to home with husband. VSS. IV removed intact. All belongings taken with her. D/C paperwork explained. Pt questions answered.

## 2017-05-19 NOTE — Discharge Summary (Signed)
[employer] [facility] [facility] [place] at [facility]   PATIENT NAME: Abigail [name]    MR#:  [id number]  DATE OF BIRTH:  [birth year]  DATE OF ADMISSION:  05/13/2017 ADMITTING PHYSICIAN: Bertrum Sol, MD  DATE OF DISCHARGE: 05/16/2017 11:00 AM  PRIMARY CARE PHYSICIAN: Joaquim Nam, MD   ADMISSION DIAGNOSIS:  Dehydration [E86.0] Sepsis, due to unspecified organism (HCC) [A41.9] Nausea and vomiting, intractability of vomiting not specified, unspecified vomiting type [R11.2]  DISCHARGE DIAGNOSIS:  Active Problems:   Sepsis (HCC)   SECONDARY DIAGNOSIS:   Past Medical History:  Diagnosis Date  . Alcoholism (HCC)   . Cholecystitis   . GERD (gastroesophageal reflux disease)   . Hypertension   . Pancreatitis 10/2015   2013 alcohol and/or gallstone  . Photosensitivity    (No formal dx of lupus) prev eval Dr. Gavin Potters in [place]     ADMITTING HISTORY  HISTORY OF PRESENT ILLNESS: Taquisha Phung  is a 57 y.o. female with a known history of per below presenting from home with 2-day history of nausea, vomiting, diarrhea on yesterday, headache, decreased p.o. intake, productive cough, generalized weakness, fatigue, fevers, chills, night sweats, in the emergency room patient was found to be febrile, tachycardic, sodium 132, chloride 97, lactic acid 2.8, anion gap 18, AST 133, ALT 63, CT abdomen noted for pancreatic head inflammation/fatty liver, lipase was normal, on examination patient is diaphoretic, warm to touch, patient is now being admitted for acute sepsis most likely secondary to viral illness.  HOSPITAL COURSE:   *E. coli bacteremia with sepsis present on admission.  Patient was treated with meropenem during the hospital stay.  Switch to ciprofloxacin at discharge.  Likely etiology is urine.  *Nonocclusive portal vein thrombosis.  Thought to be likely due to history of pancreatitis.  Started on Eliquis.  Refer to cancer center for hypercoagulable workup.  *Hyponatremia and  hypokalemia resolved.   Patient stable for discharge home.  CONSULTS OBTAINED:    DRUG ALLERGIES:   Allergies  Allergen Reactions  . Latex Rash    "hands turn beat red and start to itch"  . Atorvastatin Other (See Comments)    Swollen joints  . Dilaudid [Hydromorphone Hcl] Itching and Other (See Comments)    Itching- no rash- likely side effect but not true allergy.  Tolerates oxycodone.   [name] [Escitalopram Oxalate] Other (See Comments)    Psychological changes  . Lisinopril Other (See Comments)    Hives, presumed allergy  . Mirtazapine Swelling    swelling  . Nsaids     Gastritis 2013  . Tylenol [Acetaminophen] Other (See Comments)    Hx pancreatitis    DISCHARGE MEDICATIONS:   Allergies as of 05/16/2017      Reactions   Latex Rash   "hands turn beat red and start to itch"   Atorvastatin Other (See Comments)   Swollen joints   Dilaudid [hydromorphone Hcl] Itching, Other (See Comments)   Itching- no rash- likely side effect but not true allergy.  Tolerates oxycodone.    Lexapro [escitalopram Oxalate] Other (See Comments)   Psychological changes   Lisinopril Other (See Comments)   Hives, presumed allergy   Mirtazapine Swelling   swelling   Nsaids    Gastritis 2013   Tylenol [acetaminophen] Other (See Comments)   Hx pancreatitis      Medication List    TAKE these medications   apixaban 5 MG Tabs tablet Commonly known as:  ELIQUIS Take 1 tablet (5 mg total) by mouth  2 (two) times daily. Start taking on:  05/21/2017   ciprofloxacin 500 MG tablet Commonly known as:  CIPRO Take 1 tablet (500 mg total) by mouth 2 (two) times daily for 12 days.   cyanocobalamin 1000 MCG/ML injection Commonly known as:  (VITAMIN B-12) Inject 1,000 mcg into the muscle every 14 (fourteen) days.   gabapentin 300 MG capsule Commonly known as:  NEURONTIN Take 1-2 capsules (300-600 mg total) by mouth 2 (two) times daily.   meloxicam 15 MG tablet Commonly known as:   MOBIC Take 1 tablet (15 mg total) by mouth daily.   metoprolol tartrate 50 MG tablet Commonly known as:  LOPRESSOR TAKE 1 TABLET (50 MG TOTAL) BY MOUTH 2 (TWO) TIMES DAILY.   ondansetron 4 MG tablet Commonly known as:  ZOFRAN TAKE 1-2 TABLETS BY MOUTH EVERY 8 HOURS AS NEEDED FOR NAUSEA OR VOMITING   pantoprazole 40 MG tablet Commonly known as:  PROTONIX Take 1 tablet (40 mg total) by mouth daily.   traMADol 50 MG tablet Commonly known as:  ULTRAM Take 1 tablet (50 mg total) by mouth every 8 (eight) hours as needed.       Today   VITAL SIGNS:  Blood pressure 128/74, pulse 62, temperature 98.5 F (36.9 C), temperature source Oral, resp. rate 18, height 5' (1.524 m), weight 90.2 kg (198 lb 13.7 oz), SpO2 98 %.  I/O:  No intake or output data in the 24 hours ending 05/19/17 1456  PHYSICAL EXAMINATION:  Physical Exam  GENERAL:  57 y.o.-year-old patient lying in the bed with no acute distress.  LUNGS: Normal breath sounds bilaterally, no wheezing, rales,rhonchi or crepitation. No use of accessory muscles of respiration.  CARDIOVASCULAR: S1, S2 normal. No murmurs, rubs, or gallops.  ABDOMEN: Soft, non-tender, non-distended. Bowel sounds present. No organomegaly or mass.  NEUROLOGIC: Moves all 4 extremities. PSYCHIATRIC: The patient is alert and oriented x 3.  SKIN: No obvious rash, lesion, or ulcer.   DATA REVIEW:   CBC Recent Labs  Lab 05/15/17 0547  WBC 4.3  HGB 12.9  HCT 38.2  PLT 110*    Chemistries  Recent Labs  Lab 05/14/17 0254 05/15/17 0547  NA 139 137  K 3.5 3.4*  CL 107 106  CO2 24 22  GLUCOSE 82 89  BUN 9 6  CREATININE 0.60 0.62  CALCIUM 7.8* 8.1*  AST 98*  --   ALT 50  --   ALKPHOS 42  --   BILITOT 0.7  --     Cardiac Enzymes No results for input(s): TROPONINI in the last 168 hours.  Microbiology Results  Results for orders placed or performed during the hospital encounter of 05/13/17  Respiratory Panel by PCR     Status: None    Collection Time: 05/13/17 11:37 AM  Result Value Ref Range Status   Adenovirus NOT DETECTED NOT DETECTED Final   Coronavirus 229E NOT DETECTED NOT DETECTED Final   Coronavirus HKU1 NOT DETECTED NOT DETECTED Final   Coronavirus NL63 NOT DETECTED NOT DETECTED Final   Coronavirus OC43 NOT DETECTED NOT DETECTED Final   Metapneumovirus NOT DETECTED NOT DETECTED Final   Rhinovirus / Enterovirus NOT DETECTED NOT DETECTED Final   Influenza A NOT DETECTED NOT DETECTED Final   Influenza A H1 NOT DETECTED NOT DETECTED Final   Influenza A H1 2009 NOT DETECTED NOT DETECTED Final   Influenza A H3 NOT DETECTED NOT DETECTED Final   Influenza B NOT DETECTED NOT DETECTED Final   Parainfluenza Virus  1 NOT DETECTED NOT DETECTED Final   Parainfluenza Virus 2 NOT DETECTED NOT DETECTED Final   Parainfluenza Virus 3 NOT DETECTED NOT DETECTED Final   Parainfluenza Virus 4 NOT DETECTED NOT DETECTED Final   Respiratory Syncytial Virus NOT DETECTED NOT DETECTED Final   Bordetella pertussis NOT DETECTED NOT DETECTED Final   Chlamydophila pneumoniae NOT DETECTED NOT DETECTED Final   Mycoplasma pneumoniae NOT DETECTED NOT DETECTED Final  Culture, blood (x 2)     Status: Abnormal   Collection Time: 05/13/17 11:37 AM  Result Value Ref Range Status   Specimen Description   Final    BLOOD BLOOD LEFT WRIST Performed at [facility], [address], [place], [place] [address]    Special Requests   Final    BOTTLES DRAWN AEROBIC AND ANAEROBIC Blood Culture adequate volume Performed at [facility], [address] [place], [place] [address]    Culture  Setup Time   Final    GRAM NEGATIVE RODS IN BOTH AEROBIC AND ANAEROBIC BOTTLES CRITICAL RESULT CALLED TO, READ BACK BY AND VERIFIED WITH: Susy Frizzle [facility] AT 6213 05/14/17 SDR Performed at [facility], [address] [place], [place] [address]    Culture ESCHERICHIA COLI (A)  Final   Report Status 05/16/2017 FINAL  Final   Organism ID, Bacteria  ESCHERICHIA COLI  Final      Susceptibility   Escherichia coli - MIC*    AMPICILLIN 4 SENSITIVE Sensitive     CEFAZOLIN <=4 SENSITIVE Sensitive     CEFEPIME <=1 SENSITIVE Sensitive     CEFTAZIDIME <=1 SENSITIVE Sensitive     CEFTRIAXONE <=1 SENSITIVE Sensitive     CIPROFLOXACIN <=0.25 SENSITIVE Sensitive     GENTAMICIN <=1 SENSITIVE Sensitive     IMIPENEM <=0.25 SENSITIVE Sensitive     TRIMETH/SULFA <=20 SENSITIVE Sensitive     AMPICILLIN/SULBACTAM <=2 SENSITIVE Sensitive     PIP/TAZO <=4 SENSITIVE Sensitive     Extended ESBL NEGATIVE Sensitive     * ESCHERICHIA COLI  Culture, blood (x 2)     Status: Abnormal   Collection Time: 05/13/17 11:37 AM  Result Value Ref Range Status   Specimen Description   Final    BLOOD BLOOD LEFT HAND Performed at [facility], [address] [place], [place] [address]    Special Requests   Final    BOTTLES DRAWN AEROBIC AND ANAEROBIC Blood Culture adequate volume Performed at [facility], [address] [place], [place] [address]    Culture  Setup Time   Final    GRAM NEGATIVE RODS IN BOTH AEROBIC AND ANAEROBIC BOTTLES CRITICAL VALUE NOTED.  VALUE IS CONSISTENT WITH PREVIOUSLY REPORTED AND CALLED VALUE. Performed at [facility], [address] [place], [place] [address]    Culture (A)  Final    ESCHERICHIA COLI SUSCEPTIBILITIES PERFORMED ON PREVIOUS CULTURE WITHIN THE LAST 5 DAYS. Performed at [facility], [address], [place], [place] [address]    Report Status 05/16/2017 FINAL  Final  Blood Culture ID Panel (Reflexed)     Status: Abnormal   Collection Time: 05/13/17 11:37 AM  Result Value Ref Range Status   Enterococcus species NOT DETECTED NOT DETECTED Final   Vancomycin resistance NOT DETECTED NOT DETECTED Final   Listeria monocytogenes NOT DETECTED NOT DETECTED Final   Staphylococcus species NOT DETECTED NOT DETECTED Final   Staphylococcus aureus NOT DETECTED NOT DETECTED Final   Methicillin  resistance NOT DETECTED NOT  DETECTED Final   Streptococcus species NOT DETECTED NOT DETECTED Final   Streptococcus agalactiae NOT DETECTED NOT DETECTED Final   Streptococcus pneumoniae NOT DETECTED NOT DETECTED Final   Streptococcus pyogenes NOT DETECTED NOT DETECTED Final   Acinetobacter baumannii NOT DETECTED NOT DETECTED Final   Enterobacteriaceae species DETECTED (A) NOT DETECTED Final    Comment: CRITICAL RESULT CALLED TO, READ BACK BY AND VERIFIED WITH: MATT MCBANE AT 0535 05/14/17 SDR Enterobacteriaceae represent a large family of gram-negative bacteria, not a single organism.    Enterobacter cloacae complex NOT DETECTED NOT DETECTED Final   Escherichia coli DETECTED (A) NOT DETECTED Final    Comment: CRITICAL RESULT CALLED TO, READ BACK BY AND VERIFIED WITH:  MATT MCBANE AT 0535 05/14/17 SDR    Klebsiella oxytoca NOT DETECTED NOT DETECTED Final   Klebsiella pneumoniae NOT DETECTED NOT DETECTED Final   Proteus species NOT DETECTED NOT DETECTED Final   Serratia marcescens NOT DETECTED NOT DETECTED Final   Carbapenem resistance NOT DETECTED NOT DETECTED Final   Haemophilus influenzae NOT DETECTED NOT DETECTED Final   Neisseria meningitidis NOT DETECTED NOT DETECTED Final   Pseudomonas aeruginosa NOT DETECTED NOT DETECTED Final   Candida albicans NOT DETECTED NOT DETECTED Final   Candida glabrata NOT DETECTED NOT DETECTED Final   Candida krusei NOT DETECTED NOT DETECTED Final   Candida parapsilosis NOT DETECTED NOT DETECTED Final   Candida tropicalis NOT DETECTED NOT DETECTED Final    Comment: Performed at [facility], [address] [place], [place] [address]  Gastrointestinal Panel by PCR , Stool     Status: None   Collection Time: 05/13/17  7:54 PM  Result Value Ref Range Status   Campylobacter species NOT DETECTED NOT DETECTED Final   Plesimonas shigelloides NOT DETECTED NOT DETECTED Final   Salmonella species NOT DETECTED NOT DETECTED Final   Yersinia  enterocolitica NOT DETECTED NOT DETECTED Final   Vibrio species NOT DETECTED NOT DETECTED Final   Vibrio cholerae NOT DETECTED NOT DETECTED Final   Enteroaggregative E coli (EAEC) NOT DETECTED NOT DETECTED Final   Enteropathogenic E coli (EPEC) NOT DETECTED NOT DETECTED Final   Enterotoxigenic E coli (ETEC) NOT DETECTED NOT DETECTED Final   Shiga like toxin producing E coli (STEC) NOT DETECTED NOT DETECTED Final   Shigella/Enteroinvasive E coli (EIEC) NOT DETECTED NOT DETECTED Final   Cryptosporidium NOT DETECTED NOT DETECTED Final   Cyclospora cayetanensis NOT DETECTED NOT DETECTED Final   Entamoeba histolytica NOT DETECTED NOT DETECTED Final   Giardia lamblia NOT DETECTED NOT DETECTED Final   Adenovirus F40/41 NOT DETECTED NOT DETECTED Final   Astrovirus NOT DETECTED NOT DETECTED Final   Norovirus GI/GII NOT DETECTED NOT DETECTED Final   Rotavirus A NOT DETECTED NOT DETECTED Final   Sapovirus (I, II, IV, and V) NOT DETECTED NOT DETECTED Final    Comment: Performed at [facility], [address] [place], [place] [address]  C difficile quick scan w PCR reflex     Status: None   Collection Time: 05/13/17  7:54 PM  Result Value Ref Range Status   C Diff antigen NEGATIVE NEGATIVE Final   C Diff toxin NEGATIVE NEGATIVE Final   C Diff interpretation No C. difficile detected.  Final    Comment: Performed at [facility], [address], [place], [place] [address]  Urine Culture     Status: None   Collection Time: 05/14/17 10:19 AM  Result Value Ref Range Status   Specimen Description  Final    URINE, CLEAN CATCH Performed at [facility], [address], [place], [place] [address]    Culture   Final    NO GROWTH Performed at [facility], [address] [place], [place] [address]    Report Status 05/15/2017 FINAL  Final    RADIOLOGY:  No results found.  Follow up with PCP in 1 week.  Management plans discussed with the patient, family and  they are in agreement.  CODE STATUS:  Code Status History    Date Active Date Inactive Code Status Order ID Comments User Context   05/13/2017 16:28 05/16/2017 15:06 Full Code [id number]  Bertrum Sol, MD Inpatient   10/26/2015 16:16 10/28/2015 17:34 Full Code [id number]  Marguarite Arbour, MD Inpatient   04/19/2012 17:24 04/21/2012 16:43 Full Code [id number]  Cindy Hazy, RN Inpatient      TOTAL TIME TAKING CARE OF THIS PATIENT ON DAY OF DISCHARGE: more than 30 minutes.   Orie Fisherman M.D on 05/19/2017 at 2:56 PM  Between 7am to 6pm - Pager - [phone]  After 6pm go to [email or web address] - password [email or web address] [facility]  [facility] [place] [facility]  Office  [phone]  CC: Primary care physician; Joaquim Nam, MD  Note: This dictation was prepared with Dragon dictation along with smaller phrase technology. Any transcriptional errors that result from this process are unintentional.

## 2017-05-20 ENCOUNTER — Ambulatory Visit (INDEPENDENT_AMBULATORY_CARE_PROVIDER_SITE_OTHER): Payer: BLUE CROSS/BLUE SHIELD | Admitting: *Deleted

## 2017-05-20 ENCOUNTER — Telehealth: Payer: Self-pay | Admitting: Family Medicine

## 2017-05-20 DIAGNOSIS — E538 Deficiency of other specified B group vitamins: Secondary | ICD-10-CM

## 2017-05-20 MED ORDER — FLUCONAZOLE 150 MG PO TABS: 150.0000 mg | ORAL_TABLET | Freq: Once | ORAL | 0 refills | Status: AC

## 2017-05-20 MED ORDER — CYANOCOBALAMIN 1000 MCG/ML IJ SOLN
1000.0000 ug | Freq: Once | INTRAMUSCULAR | Status: AC
  Administered 2017-05-20: 1000 ug via INTRAMUSCULAR

## 2017-05-20 NOTE — Telephone Encounter (Signed)
Abigail DavenportSandra notified that Dr. Para Marchuncan sent in Diflucan to her pharmacy.

## 2017-05-20 NOTE — Telephone Encounter (Signed)
Sent. Thanks.   

## 2017-05-20 NOTE — Telephone Encounter (Signed)
Pt was seen at ER over the weekend and was prescribed abx. She is scheduled to see Dr. Para Marchuncan next week but is requesting a prescription for yeast infection.

## 2017-05-27 ENCOUNTER — Ambulatory Visit: Payer: BLUE CROSS/BLUE SHIELD | Admitting: Family Medicine

## 2017-05-27 ENCOUNTER — Other Ambulatory Visit: Payer: Self-pay

## 2017-05-27 ENCOUNTER — Inpatient Hospital Stay: Payer: BLUE CROSS/BLUE SHIELD | Attending: Internal Medicine | Admitting: Internal Medicine

## 2017-05-27 ENCOUNTER — Inpatient Hospital Stay: Payer: BLUE CROSS/BLUE SHIELD

## 2017-05-27 ENCOUNTER — Encounter: Payer: Self-pay | Admitting: Internal Medicine

## 2017-05-27 ENCOUNTER — Encounter: Payer: Self-pay | Admitting: Podiatry

## 2017-05-27 ENCOUNTER — Encounter: Payer: Self-pay | Admitting: Family Medicine

## 2017-05-27 VITALS — BP 104/70 | HR 68 | Temp 98.3°F | Wt 204.2 lb

## 2017-05-27 DIAGNOSIS — A419 Sepsis, unspecified organism: Secondary | ICD-10-CM

## 2017-05-27 DIAGNOSIS — Z7901 Long term (current) use of anticoagulants: Secondary | ICD-10-CM

## 2017-05-27 DIAGNOSIS — Z8051 Family history of malignant neoplasm of kidney: Secondary | ICD-10-CM | POA: Insufficient documentation

## 2017-05-27 DIAGNOSIS — Z809 Family history of malignant neoplasm, unspecified: Secondary | ICD-10-CM | POA: Insufficient documentation

## 2017-05-27 DIAGNOSIS — I10 Essential (primary) hypertension: Secondary | ICD-10-CM | POA: Diagnosis not present

## 2017-05-27 DIAGNOSIS — I81 Portal vein thrombosis: Secondary | ICD-10-CM

## 2017-05-27 DIAGNOSIS — M25579 Pain in unspecified ankle and joints of unspecified foot: Secondary | ICD-10-CM

## 2017-05-27 DIAGNOSIS — F1020 Alcohol dependence, uncomplicated: Secondary | ICD-10-CM | POA: Diagnosis not present

## 2017-05-27 DIAGNOSIS — F1011 Alcohol abuse, in remission: Secondary | ICD-10-CM

## 2017-05-27 DIAGNOSIS — K859 Acute pancreatitis without necrosis or infection, unspecified: Secondary | ICD-10-CM | POA: Insufficient documentation

## 2017-05-27 DIAGNOSIS — F419 Anxiety disorder, unspecified: Secondary | ICD-10-CM

## 2017-05-27 MED ORDER — TRAMADOL HCL 50 MG PO TABS: 50.0000 mg | ORAL_TABLET | Freq: Three times a day (TID) | ORAL | 0 refills | Status: DC | PRN

## 2017-05-27 NOTE — Patient Instructions (Signed)
Finish the antibiotics.  If you have any fevers then call for help immediately.  I'll await the follow up labs from hematology.  Don't change your other meds for now.  Use tramadol if needed for foot pain.  Use the least amount possible.  Take care.  Glad to see you.

## 2017-05-27 NOTE — Progress Notes (Signed)
  PRIMARY CARE PHYSICIAN: Joaquim Namuncan, Graham S, MD   ADMISSION DIAGNOSIS:  Dehydration [E86.0] Sepsis, due to unspecified organism (HCC) [A41.9] Nausea and vomiting, intractability of vomiting not specified, unspecified vomiting type [R11.2]  DISCHARGE DIAGNOSIS:  Active Problems:   Sepsis (HCC)   SECONDARY DIAGNOSIS:       Past Medical History:  Diagnosis Date  . Alcoholism (HCC)   . Cholecystitis   . GERD (gastroesophageal reflux disease)   . Hypertension   . Pancreatitis 10/2015   2013 alcohol and/or gallstone  . Photosensitivity    (No formal dx of lupus) [facility]     ADMITTING HISTORY  HISTORY OF PRESENT ILLNESS:SandraKingis a56 y.o.femalewith a known history of per below presenting from home with 2-day history of nausea, vomiting, diarrhea on yesterday, headache, decreased p.o. intake, productive cough, generalized weakness, fatigue, fevers, chills, night sweats, in the emergency room patient was found to be febrile, tachycardic, sodium 132, chloride 97, lactic acid 2.8, anion gap 18, AST 133, ALT 63, CT abdomen noted for pancreatic head inflammation/fatty liver, lipase was normal, on examination patient is diaphoretic, warm to touch, patient is now being admitted for acute sepsis most likely secondary to viral illness.  HOSPITAL COURSE:   *E. coli bacteremia with sepsis present on admission.  Patient was treated with meropenem during the hospital stay.  Switch to ciprofloxacin at discharge.  Likely etiology is urine.  *Nonocclusive portal vein thrombosis.  Thought to be likely due to history of pancreatitis.  Started on Eliquis.  Refer to cancer center for hypercoagulable workup.  *Hyponatremia and hypokalemia resolved.  Patient stable for discharge home. ========================================= Admitted with sepsis with incidental finding of portal vein thrombosis.  She had routine labs done and was treated with  broad-spectrum antibiotics, which were then tapered as possible.  She stabilized and was suitable for discharge home.  She had follow-up with hematology in the meantime.  We talked about her inpatient course, the pathophysiology of sepsis, the pathophysiology behind a deep venous thrombosis.  She reports her brother having 5 Leiden.  She has no history of coagulopathy previously noted.  She still feels diffusely weak, but not focally weak.  No fevers.  She does feel improved but clearly not back to baseline yet.  She still has nausea, unclear if from abx.  No FCVD.  No sweats.    She had used tramadol for foot pain.  That may be a better option than ibuprofen and tylenol given recent events.    She is really anxious about the whole series of events, discussed.    She is not drinking any alcohol per her report.  PMH and SH reviewed  ROS: Per HPI unless specifically indicated in ROS section   Meds, vitals, and allergies reviewed.   GEN: nad, alert and oriented HEENT: mucous membranes moist NECK: supple w/o LA CV: rrr. PULM: ctab, no inc wob ABD: soft, +bs EXT: no edema SKIN: no acute rash

## 2017-05-27 NOTE — Assessment & Plan Note (Addendum)
#  Likely incidental -sonographic survey demonstrates nonocclusive portal vein thrombus at the splenic vein/portal vein confluence; no obvious evidence on CT scan with contrast that was done the same admission.  #Given the family history of factor V Leiden-recommend hypercoagulable workup; including PNH/jak-2.  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   #Continue Eliquis; tolerating well; recommend duration anywhere between 3-6 months.  Will check with pharmacy regarding co-pay assistance.  #Significant family history of malignancy-we will discussed regarding genetic counseling.   # follow up in 2-3 week/s to review labs.   Thank you Dr.Duncan for allowing me to participate in the care of your pleasant patient. Please do not hesitate to contact me with questions or concerns in the interim.  Cc; Dr.Duncan.

## 2017-05-27 NOTE — Progress Notes (Signed)
[facility] CONSULT NOTE  Patient Care Team: Joaquim Nam, MD as PCP - General (Family Medicine)  CHIEF COMPLAINTS/PURPOSE OF CONSULTATION: Portal vein thrombosis  # FEB 2019- Poral vein thrombosis/non-occlusive [incidental-E.coli sepsis/UTI; Brother- factor V leiden] on eliquis  # Hx of pancreatitis [sec to alcohol/gall stones]   No history exists.     HISTORY OF PRESENTING ILLNESS:  Abigail [name] 57 y.o.  female has been referred to [place] for further recommendations/evaluation for portal vein thrombosis.  Patient states that she was recently admitted the hospital for sepsis/E. coli infection -likely UTI.  ultrasound of the abdomen-showed nonocclusive portal vein thrombosis.  Patient was started on Eliquis.  Interestingly a CT scan done at the same admission did not show any significant thrombus burden.  Patient does not currently smoke.  She does have a family history of breath or having factor V Leiden.   Patient has prior history of pancreatitis which she attributes to alcohol [given the death of her sister with the kidney cancer at 64; died at 49].  ROS: A complete 10 point review of system is done which is negative except mentioned above in history of present illness  MEDICAL HISTORY:  Past Medical History:  Diagnosis Date  . Alcoholism (HCC)   . Cholecystitis   . GERD (gastroesophageal reflux disease)   . Hypertension   . Pancreatitis 10/2015   2013 alcohol and/or gallstone  . Photosensitivity    (No formal dx of lupus) prev eval Dr. Gavin Potters in [facility]    SURGICAL HISTORY: Past Surgical History:  Procedure Laterality Date  . APPENDECTOMY     As a child  . CAT scan  11/00   wnl  . CHOLECYSTECTOMY  04/20/2012   Procedure: LAPAROSCOPIC CHOLECYSTECTOMY WITH INTRAOPERATIVE CHOLANGIOGRAM;  Surgeon: Robyne Askew, MD;  Location: [facility] OR;  Service: General;  Laterality: N/A;  . COLONOSCOPY  2005  . DOBUTAMINE STRESS ECHO  11/00   wnl  . DOPPLER  ECHOCARDIOGRAPHY     Secondary to Redux, wnl  . ESOPHAGOGASTRODUODENOSCOPY  8/01   Esophagitis [name])  . FOOT SURGERY  04/2015   Pin in small toe  . 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 NSVD     X 2  . SBFT  08/01   wnl  . TONSILLECTOMY     As a child  . TUBAL LIGATION  Late 20's    SOCIAL HISTORY: Social History   Socioeconomic History  . Marital status: Divorced    Spouse name: Not on file  . Number of children: 2  . Years of education: 42  . Highest education level: Not on file  Social Needs  . Financial resource strain: Not on file  . Food insecurity - worry: Not on file  . Food insecurity - inability: Not on file  . Transportation needs - medical: Not on file  . Transportation needs - non-medical: Not on file  Occupational History  . 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 Smoking status: Never Smoker  . Smokeless tobacco: Never Used  Substance and Sexual Activity  . Alcohol use: Yes    Alcohol/week: 1.8 oz    Types: 2 Glasses of wine, 1 Cans of beer per week    Comment: cvouple glsses of wine over weekdend and beer monday   . Drug use: No  . Sexual activity: Not on file  Other Topics Concern  . Not on file  Social  History Narrative   Divorced, 2 grown children, lives with companion   Dental Hygienist   Caffeine use- coffee in morning    FAMILY HISTORY:  sister with the kidney cancer at 93; died at 49].  Father died of kidney cancer at age of 35.  Mother had breast cancer at age 12.  Dad's brother had several cancers; 1 of dad's younger brother had colon cancer.  Family History  Problem Relation Age of Onset  . Heart disease Mother        CAD, angioplasty twice with stents  . Cancer Mother 2       Breast  . Breast cancer Mother 48  . Hypertension Mother   . Peripheral Artery Disease Mother   . Hypertension Father   . Hypothyroidism Father   . Heart disease Father        MI at age 66  . Cancer  Father 61       H/O renal CA  . Diabetes Sister   . Cancer Sister 57       Renal CA  . Factor V Leiden deficiency Brother   . Clotting disorder Brother   . Colon cancer Neg Hx     ALLERGIES:  is allergic to latex; atorvastatin; dilaudid [hydromorphone hcl]; lexapro [escitalopram oxalate]; lisinopril; mirtazapine; nsaids; and tylenol [acetaminophen].  MEDICATIONS:  Current Outpatient Medications  Medication Sig Dispense Refill  . apixaban (ELIQUIS) 5 MG TABS tablet Take 1 tablet (5 mg total) by mouth 2 (two) times daily. 60 tablet 0  . ciprofloxacin (CIPRO) 500 MG tablet Take 1 tablet (500 mg total) by mouth 2 (two) times daily for 12 days. 24 tablet 0  . cyanocobalamin (,VITAMIN B-12,) 1000 MCG/ML injection Inject 1,000 mcg into the muscle every 14 (fourteen) days.    Marland Kitchen gabapentin (NEURONTIN) 300 MG capsule Take 1-2 capsules (300-600 mg total) by mouth 2 (two) times daily. 120 capsule 12  . meloxicam (MOBIC) 15 MG tablet Take 1 tablet (15 mg total) by mouth daily. 60 tablet 1  . metoprolol tartrate (LOPRESSOR) 50 MG tablet TAKE 1 TABLET (50 MG TOTAL) BY MOUTH 2 (TWO) TIMES DAILY. 180 tablet 2  . ondansetron (ZOFRAN) 4 MG tablet TAKE 1-2 TABLETS BY MOUTH EVERY 8 HOURS AS NEEDED FOR NAUSEA OR VOMITING 30 tablet 2  . pantoprazole (PROTONIX) 40 MG tablet Take 1 tablet (40 mg total) by mouth daily. 90 tablet 3  . traMADol (ULTRAM) 50 MG tablet Take 1 tablet (50 mg total) by mouth every 8 (eight) hours as needed. 60 tablet 0   Current Facility-Administered Medications  Medication Dose Route Frequency Provider Last Rate Last Dose  . betamethasone acetate-betamethasone sodium phosphate (CELESTONE) injection 3 mg  3 mg Intramuscular Once Gala Lewandowsky M, DPM      . betamethasone acetate-betamethasone sodium phosphate (CELESTONE) injection 3 mg  3 mg Intramuscular Once Gala Lewandowsky M, DPM      . betamethasone acetate-betamethasone sodium phosphate (CELESTONE) injection 3 mg  3 mg Intramuscular Once  Gala Lewandowsky M, DPM      . betamethasone acetate-betamethasone sodium phosphate (CELESTONE) injection 3 mg  3 mg Intramuscular Once Gala Lewandowsky M, DPM      . betamethasone acetate-betamethasone sodium phosphate (CELESTONE) injection 3 mg  3 mg Intramuscular Once Felecia Shelling, DPM          .  PHYSICAL EXAMINATION: ECOG PERFORMANCE STATUS: 0 - Asymptomatic  Vitals:   05/27/17 1145  BP: 133/82  Pulse: (!) 54  Resp: 20  Temp: 97.9 F (36.6 C)   There were no vitals filed for this visit.  GENERAL: Well-nourished well-developed; Alert, no distress and comfortable.   Alone. EYES: no pallor or icterus OROPHARYNX: no thrush or ulceration; good dentition  NECK: supple, no masses felt LYMPH:  no palpable lymphadenopathy in the cervical, axillary or inguinal regions LUNGS: clear to auscultation and  No wheeze or crackles HEART/CVS: regular rate & rhythm and no murmurs; No lower extremity edema ABDOMEN: abdomen soft, non-tender and normal bowel sounds Musculoskeletal:no cyanosis of digits and no clubbing  PSYCH: alert & oriented x 3 with fluent speech NEURO: no focal motor/sensory deficits SKIN:  no rashes or significant lesions  LABORATORY DATA:  I have reviewed the data as listed Lab Results  Component Value Date   WBC 4.3 05/15/2017   HGB 12.9 05/15/2017   HCT 38.2 05/15/2017   MCV 98.3 05/15/2017   PLT 110 (L) 05/15/2017   Recent Labs    07/13/16 1130 12/17/16 1613 05/13/17 1137 05/14/17 0254 05/15/17 0547  NA  --  140 132* 139 137  K  --  4.5 3.7 3.5 3.4*  CL  --  103 97* 107 106  CO2  --  29 17* 24 22  GLUCOSE  --  79 128* 82 89  BUN  --  10 14 9 6   CREATININE  --  0.69 0.97 0.60 0.62  CALCIUM  --  9.6 8.8* 7.8* 8.1*  GFRNONAA  --   --  >60 >60 >60  GFRAA  --   --  >60 >60 >60  PROT 7.7 7.2 7.8 6.3*  --   ALBUMIN 3.7 3.8 3.5 2.9*  --   AST 64* 30 133* 98*  --   ALT 48 25 63* 50  --   ALKPHOS 80 66 56 42  --   BILITOT 1.7* 0.8 2.3* 0.7  --   BILIDIR 0.4   --   --   --   --   IBILI 1.3*  --   --   --   --     RADIOGRAPHIC STUDIES: I have personally reviewed the radiological images as listed and agreed with the findings in the report. Dg Chest 2 View  Result Date: 05/13/2017 CLINICAL DATA:  Nausea and vomiting, fever EXAM: CHEST  2 VIEW COMPARISON:  07/13/2016 FINDINGS: Hypoventilation with mild atelectasis in the bases. Negative for heart failure or pneumonia. No pleural effusion IMPRESSION: Hypoventilation with mild bibasilar atelectasis. Electronically Signed   By: Marlan Palau M.D.   On: 05/13/2017 12:44   Ct Abdomen Pelvis W Contrast  Result Date: 05/13/2017 CLINICAL DATA:  Epigastric pain for the last week or more. Nausea and vomiting. Elevated liver enzymes. History of pancreatitis. EXAM: CT ABDOMEN AND PELVIS WITH CONTRAST TECHNIQUE: Multidetector CT imaging of the abdomen and pelvis was performed using the standard protocol following bolus administration of intravenous contrast. CONTRAST:  ISOVUE-300 IOPAMIDOL (ISOVUE-300) INJECTION 61% COMPARISON:  10/26/2015 FINDINGS: Lower chest: Lung bases are clear.  No pleural or pericardial fluid. Hepatobiliary: Mild diffuse fatty change of the liver. 5 mm low-density in the ventral right lobe is unchanged and benign. No other focal liver finding. Previous cholecystectomy. Pancreas: Mild inflammatory change of the fat adjacent to the head of the pancreas. No advanced pancreatitis. No pseudocyst. No visible ductal stone. Spleen: Normal Adrenals/Urinary Tract: Adrenal glands are normal. Kidneys are normal. Bladder is normal. Stomach/Bowel: Previous Roux-en-Y gastric bypass surgery. No sign of obstruction or significant bowel finding. Vascular/Lymphatic: Aorta  is normal. IVC is normal. No retroperitoneal adenopathy. Reproductive: Normal Other: No free fluid or air. Musculoskeletal: Ordinary lower lumbar degenerative changes. IMPRESSION: Question early inflammatory change in the pancreatic head region. No  advanced diffuse pancreatitis as was seen previously. Previous cholecystectomy.  Mild fatty change of the liver. Previous Roux-en-Y gastric bypass without evidence of obstruction or other complication. Electronically Signed   By: Paulina FusiMark  Shogry M.D.   On: 05/13/2017 13:37   [place] Liver Doppler  Result Date: 05/14/2017 CLINICAL DATA:  57 year old female with a history of transaminitis EXAM: DUPLEX ULTRASOUND OF LIVER TECHNIQUE: Color and duplex Doppler ultrasound was performed to evaluate the hepatic in-flow and out-flow vessels. COMPARISON:  CT 05/13/2017 FINDINGS: Portal Vein Velocities Main:  30 cm/sec Right:  6.0 cm/sec Left:  11 cm/sec Hepatic Vein Velocities Right:  14 cm/sec Middle:  38 cm/sec Left:  19 cm/sec Hepatic Artery Velocity:  71 cm/sec Splenic Vein Velocity:  24 cm/sec Varices: Absent Ascites: Absent Nonocclusive portal vein thrombus at the splenic vein/portal vein confluence. IMPRESSION: Sonographic survey demonstrates nonocclusive portal vein thrombus at the splenic vein/portal vein confluence. Hepatopetal portal flow. Unremarkable hepatic artery waveform. Signed, Yvone NeuJaime S. Loreta AveWagner, DO [facility] Electronically Signed   By: Gilmer MorJaime  Wagner D.O.   On: 05/14/2017 10:49    ASSESSMENT & PLAN:   Portal vein thrombosis #Likely incidental -sonographic survey demonstrates nonocclusive portal vein thrombus at the splenic vein/portal vein confluence; no obvious evidence on CT scan with contrast that was done the same admission.  #Given the family history of factor V Leiden-recommend hypercoagulable workup; including PNH/jak-2.  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   #Continue Eliquis; tolerating well; recommend duration anywhere between 3-6 months.  Will check with pharmacy regarding co-pay assistance.  #Significant family history of malignancy-we will discussed regarding genetic counseling.   # follow up in 2-3  week/s to review labs.   Thank you Dr.Duncan for allowing me to participate in the care of your pleasant patient. Please do not hesitate to contact me with questions or concerns in the interim.  Cc; Dr.Duncan.      All questions were answered. The patient knows to call the clinic with any problems, questions or concerns.       Earna CoderGovinda R Lavera Vandermeer, MD 05/28/2017 8:17 AM

## 2017-05-28 MED ORDER — MELOXICAM 15 MG PO TABS: 15.0000 mg | ORAL_TABLET | Freq: Every day | ORAL | 1 refills | Status: DC

## 2017-05-29 LAB — CARDIOLIPIN ANTIBODIES, IGG, IGM, IGA
Anticardiolipin IgA: <9 APL U/mL (ref 0–11)
Anticardiolipin IgG: 11 GPL U/mL (ref 0–14)
Anticardiolipin IgM: <9 MPL U/mL (ref 0–12)

## 2017-05-29 LAB — BETA-2-GLYCOPROTEIN I ABS, IGG/M/A: Beta-2-Glycoprotein I IgM: <9 GPI IgM units (ref 0–32)

## 2017-05-30 ENCOUNTER — Encounter: Payer: Self-pay | Admitting: Family Medicine

## 2017-05-30 NOTE — Assessment & Plan Note (Signed)
She reports sobriety, encouraged.

## 2017-05-30 NOTE — Assessment & Plan Note (Addendum)
Resolved.  Inpatient course discussed with patient above.  I would expect her to gradually regain some of her strength.  Pathophysiology discussed, inpatient labs discussed with patient.  At this point still okay for outpatient follow-up.  See after visit summary regarding medication usage. >25 minutes spent in face to face time with patient, >50% spent in counselling or coordination of care.

## 2017-05-30 NOTE — Assessment & Plan Note (Signed)
Given her history of foot pain it may be reasonable to continue tramadol.  This is likely a better option than either ibuprofen or Tylenol given her other issues and history. No ADE on med.   D/w pt.  Routine cautions discussed with patient.

## 2017-05-30 NOTE — Assessment & Plan Note (Signed)
She had incidental finding of portal vein thrombosis and is now tolerating anticoagulation without bleeding or adverse event.  Pathophysiology of deep venous thrombosis discussed with patient.  She has pathology follow-up, discussed with patient.  No change in medication at this point. No abd pain.

## 2017-05-30 NOTE — Assessment & Plan Note (Signed)
I think she felt some better about recent events after discussing her inpatient course and the plan moving forward.  Okay for outpatient follow-up.

## 2017-05-31 LAB — PNH PROFILE (-HIGH SENSITIVITY)

## 2017-05-31 LAB — JAK2 GENOTYPR

## 2017-05-31 LAB — FACTOR 5 LEIDEN

## 2017-06-02 LAB — PROTHROMBIN GENE MUTATION

## 2017-06-03 ENCOUNTER — Ambulatory Visit (INDEPENDENT_AMBULATORY_CARE_PROVIDER_SITE_OTHER): Payer: BLUE CROSS/BLUE SHIELD

## 2017-06-03 DIAGNOSIS — E538 Deficiency of other specified B group vitamins: Secondary | ICD-10-CM

## 2017-06-03 MED ORDER — CYANOCOBALAMIN 1000 MCG/ML IJ SOLN
1000.0000 ug | Freq: Once | INTRAMUSCULAR | Status: AC
  Administered 2017-06-03: 1000 ug via INTRAMUSCULAR

## 2017-06-06 NOTE — Progress Notes (Signed)
Agree. Thanks

## 2017-06-11 ENCOUNTER — Encounter: Payer: Self-pay | Admitting: Primary Care

## 2017-06-11 ENCOUNTER — Ambulatory Visit: Payer: BLUE CROSS/BLUE SHIELD | Admitting: Primary Care

## 2017-06-11 VITALS — BP 138/76 | HR 83 | Temp 98.2°F | Ht 60.0 in | Wt 194.2 lb

## 2017-06-11 DIAGNOSIS — J309 Allergic rhinitis, unspecified: Secondary | ICD-10-CM | POA: Diagnosis not present

## 2017-06-11 DIAGNOSIS — J4520 Mild intermittent asthma, uncomplicated: Secondary | ICD-10-CM

## 2017-06-11 MED ORDER — ALBUTEROL SULFATE HFA 108 (90 BASE) MCG/ACT IN AERS: 2.0000 | INHALATION_SPRAY | RESPIRATORY_TRACT | 0 refills | Status: DC | PRN

## 2017-06-11 NOTE — Patient Instructions (Signed)
Your symptoms are representative of a viral illness which will resolve on its own over time. Our goal is to treat your symptoms in order to aid your body in the healing process and to make you more comfortable.   Shortness of Breath/Wheezing/Cough: Use the albuterol inhaler. Inhale 2 puffs into the lungs every 4 to 6 hours as needed for wheezing, cough, and/or shortness of breath.   You can take Delsym or Robitussin as needed for cough.   Start Claritin or Allegra once daily for throat drainage.   Please call me if no improvement after Tuesday next week or if your breathing gets worse.  It was a pleasure meeting you!

## 2017-06-11 NOTE — Progress Notes (Signed)
Subjective:    Patient ID: Abigail [name], female    DOB: Jun 14, 1960, 57 y.o.   MRN: [id number]  HPI  Ms. Abigail [name] is a 57 year old female with a history of asthma who presents today with a chief complaint of voice hoarseness.   She also reports mild cough, post nasal drip, mild wheezing and chest tightness. Her symptoms began 3 days ago. She denies fevers, chills. She does not have an albuterol inhaler at home. She's not taken anything OTC, has been gargling warm salt water.   Review of Systems  Constitutional: Negative for chills and fever.  HENT: Positive for postnasal drip and sinus pressure. Negative for ear pain.   Respiratory: Positive for cough, chest tightness and shortness of breath.   Cardiovascular: Negative for chest pain.       Past Medical History:  Diagnosis Date  . Alcoholism (HCC)   . Cholecystitis   . GERD (gastroesophageal reflux disease)   . Hypertension   . Pancreatitis 10/2015   2013 alcohol and/or gallstone  . Photosensitivity    (No formal dx of lupus) [facility] [place]  . Portal vein thrombosis 2019     Social History   Socioeconomic History  . Marital status: Divorced    Spouse name: Not on file  . Number of children: 2  . Years of education: 5914  . Highest education level: Not on file  Social Needs  . Financial resource strain: Not on file  . Food insecurity - worry: Not on file  . Food insecurity - inability: Not on file  . Transportation needs - medical: Not on file  . Transportation needs - non-medical: Not on file  Occupational History  . 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 Smoking status: Never Smoker  . Smokeless tobacco: Never Used  Substance and Sexual Activity  . Alcohol use: Yes    Alcohol/week: 1.8 oz    Types: 2 Glasses of wine, 1 Cans of beer per week    Comment: cvouple glsses of wine over weekdend and beer monday   . Drug use: No  . Sexual activity: Not on file    Other Topics Concern  . Not on file  Social History Narrative   Divorced, 2 grown children, lives with companion   Dental Hygienist   Caffeine use- coffee in morning    Past Surgical History:  Procedure Laterality Date  . APPENDECTOMY     As a child  . CAT scan  11/00   wnl  . CHOLECYSTECTOMY  04/20/2012   Procedure: LAPAROSCOPIC CHOLECYSTECTOMY WITH INTRAOPERATIVE CHOLANGIOGRAM;  Surgeon: Robyne AskewPaul S Toth III, MD;  [facility];  Service: General;  Laterality: N/A;  . COLONOSCOPY  2005  . DOBUTAMINE STRESS ECHO  11/00   wnl  . DOPPLER ECHOCARDIOGRAPHY     Secondary to Redux, wnl  . ESOPHAGOGASTRODUODENOSCOPY  8/01   Esophagitis [name]([place])  . FOOT SURGERY  04/2015   Pin in small toe  . 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 NSVD     X 2  . SBFT  08/01   wnl  . TONSILLECTOMY     As a child  . TUBAL LIGATION  Late 20's    Family History  Problem Relation Age of Onset  . Heart disease Mother        CAD, angioplasty twice with stents  . Cancer Mother 4250  Breast  . Breast cancer Mother 9  . Hypertension Mother   . Peripheral Artery Disease Mother   . Hypertension Father   . Hypothyroidism Father   . Heart disease Father        MI at age 16  . Cancer Father 89       H/O renal CA  . Diabetes Sister   . Cancer Sister 64       Renal CA  . Factor V Leiden deficiency Brother   . Clotting disorder Brother   . Colon cancer Neg Hx     Allergies  Allergen Reactions  . Latex Rash    "hands turn beat red and start to itch"  . Atorvastatin Other (See Comments)    Swollen joints  . Dilaudid [Hydromorphone Hcl] Itching and Other (See Comments)    Itching- no rash- likely side effect but not true allergy.  Tolerates oxycodone.   [name] [Escitalopram Oxalate] Other (See Comments)    Psychological changes  . Lisinopril Other (See Comments)    Hives, presumed allergy  . Mirtazapine Swelling    swelling  . Nsaids     Gastritis 2013  . Tylenol  [Acetaminophen] Other (See Comments)    Hx pancreatitis    Current Outpatient Medications on File Prior to Visit  Medication Sig Dispense Refill  . apixaban (ELIQUIS) 5 MG TABS tablet Take 1 tablet (5 mg total) by mouth 2 (two) times daily. 60 tablet 0  . cyanocobalamin (,VITAMIN B-12,) 1000 MCG/ML injection Inject 1,000 mcg into the muscle every 14 (fourteen) days.    Marland Kitchen gabapentin (NEURONTIN) 300 MG capsule Take 1-2 capsules (300-600 mg total) by mouth 2 (two) times daily. 120 capsule 12  . meloxicam (MOBIC) 15 MG tablet Take 1 tablet (15 mg total) by mouth daily. 60 tablet 1  . metoprolol tartrate (LOPRESSOR) 50 MG tablet TAKE 1 TABLET (50 MG TOTAL) BY MOUTH 2 (TWO) TIMES DAILY. 180 tablet 2  . ondansetron (ZOFRAN) 4 MG tablet TAKE 1-2 TABLETS BY MOUTH EVERY 8 HOURS AS NEEDED FOR NAUSEA OR VOMITING 30 tablet 2  . pantoprazole (PROTONIX) 40 MG tablet Take 1 tablet (40 mg total) by mouth daily. 90 tablet 3  . traMADol (ULTRAM) 50 MG tablet Take 1 tablet (50 mg total) by mouth every 8 (eight) hours as needed. 60 tablet 0   Current Facility-Administered Medications on File Prior to Visit  Medication Dose Route Frequency Provider Last Rate Last Dose  . betamethasone acetate-betamethasone sodium phosphate (CELESTONE) injection 3 mg  3 mg Intramuscular Once Gala Lewandowsky M, DPM      . betamethasone acetate-betamethasone sodium phosphate (CELESTONE) injection 3 mg  3 mg Intramuscular Once Gala Lewandowsky M, DPM      . betamethasone acetate-betamethasone sodium phosphate (CELESTONE) injection 3 mg  3 mg Intramuscular Once Gala Lewandowsky M, DPM      . betamethasone acetate-betamethasone sodium phosphate (CELESTONE) injection 3 mg  3 mg Intramuscular Once Gala Lewandowsky M, DPM      . betamethasone acetate-betamethasone sodium phosphate (CELESTONE) injection 3 mg  3 mg Intramuscular Once Evans, Brent M, DPM        BP 138/76   Pulse 83   Temp 98.2 F (36.8 C) (Oral)   Ht 5' (1.524 m)   Wt 194 lb 4 oz (88.1  kg)   LMP  (LMP Unknown)   SpO2 98%   BMI 37.94 kg/m    Objective:   Physical Exam  Constitutional: She appears well-nourished.  HENT:  Right Ear: Ear canal normal. Tympanic membrane is not erythematous. A middle ear effusion is present.  Left Ear: Tympanic membrane and ear canal normal. Tympanic membrane is not erythematous.  Nose: Right sinus exhibits no maxillary sinus tenderness and no frontal sinus tenderness. Left sinus exhibits no maxillary sinus tenderness and no frontal sinus tenderness.  Mouth/Throat: Oropharynx is clear and moist.  Eyes: Conjunctivae are normal.  Neck: Neck supple.  Cardiovascular: Normal rate and regular rhythm.  Pulmonary/Chest: Effort normal. She has wheezes in the left upper field. She has no rales.  Lymphadenopathy:    She has no cervical adenopathy.  Skin: Skin is warm and dry.          Assessment & Plan:  Allergic Rhinitis:  Post nasal drip, voice hoarseness, mild cough, chest tightness x 3 days. Exam today with mild wheezing to left upper field, overall unremarkable. Does not appear acutely ill, in no distress. Do not suspect bacterial involvement, she is not experiencing an asthma attack. Rx for albuterol inhaler sent to pharmacy. Discussed antihistamine use daily, Delsym or Robitussin PRN. Discussed return precautions.   Doreene Nest, NP

## 2017-06-16 ENCOUNTER — Ambulatory Visit: Payer: BLUE CROSS/BLUE SHIELD | Admitting: Family Medicine

## 2017-06-16 ENCOUNTER — Encounter: Payer: Self-pay | Admitting: Family Medicine

## 2017-06-16 VITALS — BP 118/78 | HR 57 | Temp 98.0°F | Ht 60.0 in | Wt 194.5 lb

## 2017-06-16 DIAGNOSIS — J0100 Acute maxillary sinusitis, unspecified: Secondary | ICD-10-CM | POA: Diagnosis not present

## 2017-06-16 DIAGNOSIS — S161XXA Strain of muscle, fascia and tendon at neck level, initial encounter: Secondary | ICD-10-CM | POA: Diagnosis not present

## 2017-06-16 DIAGNOSIS — E538 Deficiency of other specified B group vitamins: Secondary | ICD-10-CM

## 2017-06-16 DIAGNOSIS — J019 Acute sinusitis, unspecified: Secondary | ICD-10-CM | POA: Insufficient documentation

## 2017-06-16 MED ORDER — CYANOCOBALAMIN 1000 MCG/ML IJ SOLN
1000.0000 ug | Freq: Once | INTRAMUSCULAR | Status: AC
  Administered 2017-06-16: 1000 ug via INTRAMUSCULAR

## 2017-06-16 MED ORDER — AMOXICILLIN-POT CLAVULANATE 875-125 MG PO TABS: 1.0000 | ORAL_TABLET | Freq: Two times a day (BID) | ORAL | 0 refills | Status: DC

## 2017-06-16 NOTE — Progress Notes (Signed)
Subjective:    Patient ID: Abigail [name], female    DOB: [birth year], 57 y.o.   MRN: [id number]  HPI Here for facial pain and hoarse voice   She was seen 3/9 by NP Clark for hoarse voice and wheezing diag with allergic symptoms/poss viral  Antihistamine /expectorant and albuterol recommended   That all helped with breathing/wheezing  Still has cough  Still achy and cold and hot - has not taken her temp / usually happens at night  Hoarseness is a lot better   Now has a pain in R side of her neck-just sore  Worse to turn her head  First thing in am is stiff   Heat helps it   Some sinus pain in the R side - under eye "hurt to blink" Whole cheek was sore   Nasal drainage and chest drainage- yellow/green   Due for B12 shot-can get today   Pulse Rate: (!) 57    Temp: 98 F (36.7 C)  Pulse ox is 97%  Patient Active Problem List   Diagnosis Date Noted  . Acute sinusitis 06/16/2017  . Portal vein thrombosis 05/27/2017  . Sepsis (HCC) 05/13/2017  . History of acute pancreatitis 10/26/2015  . Other migraine without status migrainosus, not intractable 09/17/2015  . Sciatica 10/10/2014  . Routine general medical examination at a health care facility 06/05/2014  . Advance care planning 06/05/2014  . Pain in joint, shoulder region 07/19/2013  . History of alcohol abuse 09/13/2012  . Anxiety 04/10/2012  . B12 deficiency 01/31/2010  . Pain in joint 01/31/2010  . Essential hypertension 08/16/2006  . ASTHMA, EXTRINSIC NOS 08/16/2006  . ESOPHAGITIS 08/16/2006  . GERD 08/16/2006   Past Medical History:  Diagnosis Date  . Alcoholism (HCC)   . Cholecystitis   . GERD (gastroesophageal reflux disease)   . Hypertension   . Pancreatitis 10/2015   2013 alcohol and/or gallstone  . Photosensitivity    (No formal dx of lupus) [facility] [place]  . Portal vein thrombosis 2019   Past Surgical History:  Procedure Laterality Date  . APPENDECTOMY     As a child    . CAT scan  11/00   wnl  . CHOLECYSTECTOMY  04/20/2012   Procedure: LAPAROSCOPIC CHOLECYSTECTOMY WITH INTRAOPERATIVE CHOLANGIOGRAM;  Surgeon: Robyne AskewPaul S Toth III, MD;  [facility];  Service: General;  Laterality: N/A;  . COLONOSCOPY  2005  . DOBUTAMINE STRESS ECHO  11/00   wnl  . DOPPLER ECHOCARDIOGRAPHY     Secondary to Redux, wnl  . ESOPHAGOGASTRODUODENOSCOPY  8/01   Esophagitis [name]([facility])  . FOOT SURGERY  04/2015   Pin in small toe  . 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 NSVD     X 2  . SBFT  08/01   wnl  . TONSILLECTOMY     As a child  . TUBAL LIGATION  Late 20's   Social History   Tobacco Use  . Smoking status: Never Smoker  . Smokeless tobacco: Never Used  Substance Use Topics  . Alcohol use: Yes    Alcohol/week: 1.8 oz    Types: 2 Glasses of wine, 1 Cans of beer per week    Comment: cvouple glsses of wine over weekdend and beer monday   . Drug use: No   Family History  Problem Relation Age of Onset  . Heart disease Mother        CAD, angioplasty twice with stents  . Cancer Mother  50       Breast  . Breast cancer Mother 59  . Hypertension Mother   . Peripheral Artery Disease Mother   . Hypertension Father   . Hypothyroidism Father   . Heart disease Father        MI at age 25  . Cancer Father 65       H/O renal CA  . Diabetes Sister   . Cancer Sister 14       Renal CA  . Factor V Leiden deficiency Brother   . Clotting disorder Brother   . Colon cancer Neg Hx    Allergies  Allergen Reactions  . Latex Rash    "hands turn beat red and start to itch"  . Atorvastatin Other (See Comments)    Swollen joints  . Dilaudid [Hydromorphone Hcl] Itching and Other (See Comments)    Itching- no rash- likely side effect but not true allergy.  Tolerates oxycodone.   [name] [Escitalopram Oxalate] Other (See Comments)    Psychological changes  . Lisinopril Other (See Comments)    Hives, presumed allergy  . Mirtazapine Swelling    swelling  .  Nsaids     Gastritis 2013  . Tylenol [Acetaminophen] Other (See Comments)    Hx pancreatitis   Current Outpatient Medications on File Prior to Visit  Medication Sig Dispense Refill  . albuterol (PROVENTIL HFA;VENTOLIN HFA) 108 (90 Base) MCG/ACT inhaler Inhale 2 puffs into the lungs every 4 (four) hours as needed for wheezing or shortness of breath. 1 Inhaler 0  . apixaban (ELIQUIS) 5 MG TABS tablet Take 1 tablet (5 mg total) by mouth 2 (two) times daily. 60 tablet 0  . cyanocobalamin (,VITAMIN B-12,) 1000 MCG/ML injection Inject 1,000 mcg into the muscle every 14 (fourteen) days.    Marland Kitchen gabapentin (NEURONTIN) 300 MG capsule Take 1-2 capsules (300-600 mg total) by mouth 2 (two) times daily. 120 capsule 12  . meloxicam (MOBIC) 15 MG tablet Take 1 tablet (15 mg total) by mouth daily. 60 tablet 1  . metoprolol tartrate (LOPRESSOR) 50 MG tablet TAKE 1 TABLET (50 MG TOTAL) BY MOUTH 2 (TWO) TIMES DAILY. 180 tablet 2  . ondansetron (ZOFRAN) 4 MG tablet TAKE 1-2 TABLETS BY MOUTH EVERY 8 HOURS AS NEEDED FOR NAUSEA OR VOMITING 30 tablet 2  . pantoprazole (PROTONIX) 40 MG tablet Take 1 tablet (40 mg total) by mouth daily. 90 tablet 3  . traMADol (ULTRAM) 50 MG tablet Take 1 tablet (50 mg total) by mouth every 8 (eight) hours as needed. 60 tablet 0   Current Facility-Administered Medications on File Prior to Visit  Medication Dose Route Frequency Provider Last Rate Last Dose  . betamethasone acetate-betamethasone sodium phosphate (CELESTONE) injection 3 mg  3 mg Intramuscular Once Gala Lewandowsky M, DPM      . betamethasone acetate-betamethasone sodium phosphate (CELESTONE) injection 3 mg  3 mg Intramuscular Once Gala Lewandowsky M, DPM      . betamethasone acetate-betamethasone sodium phosphate (CELESTONE) injection 3 mg  3 mg Intramuscular Once Gala Lewandowsky M, DPM      . betamethasone acetate-betamethasone sodium phosphate (CELESTONE) injection 3 mg  3 mg Intramuscular Once Gala Lewandowsky M, DPM      .  betamethasone acetate-betamethasone sodium phosphate (CELESTONE) injection 3 mg  3 mg Intramuscular Once Felecia Shelling, DPM        Review of Systems  Constitutional: Positive for appetite change. Negative for fatigue and fever.  HENT: Positive for congestion, ear pain,  postnasal drip, rhinorrhea, sinus pressure and sore throat. Negative for nosebleeds.   Eyes: Negative for pain, redness and itching.  Respiratory: Positive for cough. Negative for shortness of breath and wheezing.   Cardiovascular: Negative for chest pain.  Gastrointestinal: Negative for abdominal pain, diarrhea, nausea and vomiting.  Endocrine: Negative for polyuria.  Genitourinary: Negative for dysuria, frequency and urgency.  Musculoskeletal: Positive for neck pain. Negative for arthralgias, myalgias and neck stiffness.  Allergic/Immunologic: Negative for immunocompromised state.  Neurological: Positive for headaches. Negative for dizziness, tremors, syncope, weakness and numbness.  Hematological: Negative for adenopathy. Does not bruise/bleed easily.  Psychiatric/Behavioral: Negative for dysphoric mood. The patient is not nervous/anxious.        Objective:   Physical Exam  Constitutional: She appears well-developed and well-nourished. No distress.  obese and well appearing   HENT:  Head: Normocephalic and atraumatic.  Right Ear: External ear normal.  Left Ear: External ear normal.  Mouth/Throat: Oropharynx is clear and moist. No oropharyngeal exudate.  Nares are injected and congested (severe cong) Bilateral maxillary sinus tenderness -much worse on the R Post nasal drip   Eyes: Conjunctivae and EOM are normal. Pupils are equal, round, and reactive to light. Right eye exhibits no discharge. Left eye exhibits no discharge.  Neck: Normal range of motion. Neck supple.  Cardiovascular: Normal rate and regular rhythm.  Pulmonary/Chest: Effort normal and breath sounds normal. No respiratory distress. She has no  wheezes. She has no rales.  Good air exch  No rales or rhonchi  Musculoskeletal:  Sore/tender  cervical muscles on R -worse to rotate L No bony tenderness Nl rom  No rigidity  Lymphadenopathy:    She has no cervical adenopathy.  Neurological: She is alert. No cranial nerve deficit.  Skin: Skin is warm and dry. No rash noted.  Psychiatric: She has a normal mood and affect.          Assessment & Plan:   Problem List Items Addressed This Visit      Respiratory   Acute sinusitis - Primary    S/p uri  Cover with augmentin  supp care Disc symptomatic care - see instructions on AVS  Update if not starting to improve in a week or if worsening         Relevant Medications   amoxicillin-clavulanate (AUGMENTIN) 875-125 MG tablet     Musculoskeletal and Integument   Neck strain    On R side No neuro symptoms  Recommend heat/gentle stretching Cervical support pillow  Update if not starting to improve in a week or if worsening          Other   B12 deficiency    B12 shot today      Relevant Medications   cyanocobalamin ((VITAMIN B-12)) injection 1,000 mcg (Completed)

## 2017-06-16 NOTE — Patient Instructions (Addendum)
I think you have a sinus infection Take the augmentin as directed Breathe steam  Use nasal saline or netti pot  flonase nasal spray over the counter may help if used for about 2 weeks   Update if not starting to improve in a week or if worsening    B12 shot today

## 2017-06-17 ENCOUNTER — Inpatient Hospital Stay: Payer: BLUE CROSS/BLUE SHIELD | Attending: Internal Medicine

## 2017-06-17 ENCOUNTER — Ambulatory Visit: Payer: BLUE CROSS/BLUE SHIELD

## 2017-06-17 ENCOUNTER — Other Ambulatory Visit: Payer: Self-pay

## 2017-06-17 ENCOUNTER — Encounter: Payer: Self-pay | Admitting: Internal Medicine

## 2017-06-17 ENCOUNTER — Telehealth: Payer: Self-pay | Admitting: *Deleted

## 2017-06-17 ENCOUNTER — Telehealth: Payer: Self-pay | Admitting: Internal Medicine

## 2017-06-17 ENCOUNTER — Inpatient Hospital Stay (HOSPITAL_BASED_OUTPATIENT_CLINIC_OR_DEPARTMENT_OTHER): Payer: BLUE CROSS/BLUE SHIELD | Admitting: Internal Medicine

## 2017-06-17 VITALS — BP 129/81 | HR 55 | Temp 97.9°F | Resp 16 | Wt 196.2 lb

## 2017-06-17 DIAGNOSIS — Z7901 Long term (current) use of anticoagulants: Secondary | ICD-10-CM

## 2017-06-17 DIAGNOSIS — I81 Portal vein thrombosis: Secondary | ICD-10-CM

## 2017-06-17 DIAGNOSIS — S161XXA Strain of muscle, fascia and tendon at neck level, initial encounter: Secondary | ICD-10-CM | POA: Insufficient documentation

## 2017-06-17 MED ORDER — APIXABAN 5 MG PO TABS: 5.0000 mg | ORAL_TABLET | Freq: Two times a day (BID) | ORAL | 0 refills | Status: DC

## 2017-06-17 NOTE — Assessment & Plan Note (Signed)
B12 shot today. 

## 2017-06-17 NOTE — Assessment & Plan Note (Signed)
S/p uri  Cover with augmentin  supp care Disc symptomatic care - see instructions on AVS  Update if not starting to improve in a week or if worsening

## 2017-06-17 NOTE — Telephone Encounter (Signed)
Oral Oncology Patient Advocate Encounter  Got patient a co-pay card for her Eliquis $10.00 a month for 24 months up to $3,800.00. Gave to  Patient in exam room. Told her to let me know if I could help her in any way.    [facility] Patient Advocate [phone] 06/17/2017 11:06 AM

## 2017-06-17 NOTE — Assessment & Plan Note (Signed)
On R side No neuro symptoms  Recommend heat/gentle stretching Cervical support pillow  Update if not starting to improve in a week or if worsening

## 2017-06-17 NOTE — Telephone Encounter (Signed)
I left message for patient to return phone call.   

## 2017-06-17 NOTE — Telephone Encounter (Signed)
Needs Eliquis, she is complete;y out of medicine. If we cannot get auth to pharmacy, then she "will need samples"

## 2017-06-17 NOTE — Progress Notes (Signed)
[facility] CONSULT NOTE  Patient Care Team: Joaquim Namuncan, Graham S, MD as PCP - General (Family Medicine)  CHIEF COMPLAINTS/PURPOSE OF CONSULTATION: Portal vein thrombosis  # FEB 2019- Portal vein thrombosis/non-occlusive [incidental-E.coli sepsis/UTI; Brother- factor V leiden] on eliquis; Feb 2019- FACTOR V LEIDEN HETEROZYGOUS; APS/PNH/Jak-2 NEG.   # Hx of pancreatitis [sec to alcohol/gall stones; prior Hx of pancreatitis-sec to alcohol abuse; [place] Cage[death of her sister with the kidney cancer at 9047; died at 49].]   No history exists.     HISTORY OF PRESENTING ILLNESS:  Abigail [name] 57 y.o.  female history of portal vein thrombosis-currently on Eliquis is here to review the results of her blood work.  Patient currently denies any abdominal pain.  Denies any blood in stools black colored stools.  Denies any falls.  Patient is concerned about the expense of her medication-Eliquis.  Patient is excited about a travel to [place]/4 cousins pending.  ROS: A complete 10 point review of system is done which is negative except mentioned above in history of present illness  MEDICAL HISTORY:  Past Medical History:  Diagnosis Date  . Alcoholism (HCC)   . Cholecystitis   . GERD (gastroesophageal reflux disease)   . Hypertension   . Pancreatitis 10/2015   2013 alcohol and/or gallstone  . Photosensitivity    (No formal dx of lupus) [facility] [place]  . Portal vein thrombosis 2019    SURGICAL HISTORY: Past Surgical History:  Procedure Laterality Date  . APPENDECTOMY     As a child  . CAT scan  11/00   wnl  . CHOLECYSTECTOMY  04/20/2012   Procedure: LAPAROSCOPIC CHOLECYSTECTOMY WITH INTRAOPERATIVE CHOLANGIOGRAM;  Surgeon: Robyne AskewPaul S Toth III, MD;  [facility];  Service: General;  Laterality: N/A;  . COLONOSCOPY  2005  . DOBUTAMINE STRESS ECHO  11/00   wnl  . DOPPLER ECHOCARDIOGRAPHY     Secondary to Redux, wnl  . ESOPHAGOGASTRODUODENOSCOPY  8/01   Esophagitis  [name]([facility])  . FOOT SURGERY  04/2015   Pin in small toe  . 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 NSVD     X 2  . SBFT  08/01   wnl  . TONSILLECTOMY     As a child  . TUBAL LIGATION  Late 20's    SOCIAL HISTORY: Social History   Socioeconomic History  . Marital status: Divorced    Spouse name: Not on file  . Number of children: 2  . Years of education: 6814  . Highest education level: Not on file  Social Needs  . Financial resource strain: Not on file  . Food insecurity - worry: Not on file  . Food insecurity - inability: Not on file  . Transportation needs - medical: Not on file  . Transportation needs - non-medical: Not on file  Occupational History  . 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 Smoking status: Never Smoker  . Smokeless tobacco: Never Used  Substance and Sexual Activity  . Alcohol use: Yes    Alcohol/week: 1.8 oz    Types: 2 Glasses of wine, 1 Cans of beer per week    Comment: cvouple glsses of wine over weekdend and beer monday   . Drug use: No  . Sexual activity: Not on file  Other Topics Concern  . Not on file  Social History Narrative   Divorced, 2 grown children, lives with companion   Armed forces operational officerDental Hygienist  Caffeine use- coffee in morning    FAMILY HISTORY:  sister with the kidney cancer at 5; died at 49].  Father died of kidney cancer at age of 75.  Mother had breast cancer at age 72.  Dad's brother had several cancers; 1 of dad's younger brother had colon cancer.  Family History  Problem Relation Age of Onset  . Heart disease Mother        CAD, angioplasty twice with stents  . Cancer Mother 27       Breast  . Breast cancer Mother 33  . Hypertension Mother   . Peripheral Artery Disease Mother   . Hypertension Father   . Hypothyroidism Father   . Heart disease Father        MI at age 19  . Cancer Father 19       H/O renal CA  . Diabetes Sister   . Cancer Sister 41       Renal CA  . Factor V  Leiden deficiency Brother   . Clotting disorder Brother   . Colon cancer Neg Hx     ALLERGIES:  is allergic to latex; atorvastatin; dilaudid [hydromorphone hcl]; lexapro [escitalopram oxalate]; lisinopril; mirtazapine; nsaids; and tylenol [acetaminophen].  MEDICATIONS:  Current Outpatient Medications  Medication Sig Dispense Refill  . albuterol (PROVENTIL HFA;VENTOLIN HFA) 108 (90 Base) MCG/ACT inhaler Inhale 2 puffs into the lungs every 4 (four) hours as needed for wheezing or shortness of breath. 1 Inhaler 0  . amoxicillin-clavulanate (AUGMENTIN) 875-125 MG tablet Take 1 tablet by mouth 2 (two) times daily. 14 tablet 0  . cyanocobalamin (,VITAMIN B-12,) 1000 MCG/ML injection Inject 1,000 mcg into the muscle every 14 (fourteen) days.    Marland Kitchen gabapentin (NEURONTIN) 300 MG capsule Take 1-2 capsules (300-600 mg total) by mouth 2 (two) times daily. 120 capsule 12  . meloxicam (MOBIC) 15 MG tablet Take 1 tablet (15 mg total) by mouth daily. 60 tablet 1  . metoprolol tartrate (LOPRESSOR) 50 MG tablet TAKE 1 TABLET (50 MG TOTAL) BY MOUTH 2 (TWO) TIMES DAILY. 180 tablet 2  . ondansetron (ZOFRAN) 4 MG tablet TAKE 1-2 TABLETS BY MOUTH EVERY 8 HOURS AS NEEDED FOR NAUSEA OR VOMITING 30 tablet 2  . pantoprazole (PROTONIX) 40 MG tablet Take 1 tablet (40 mg total) by mouth daily. 90 tablet 3  . traMADol (ULTRAM) 50 MG tablet Take 1 tablet (50 mg total) by mouth every 8 (eight) hours as needed. 60 tablet 0  . apixaban (ELIQUIS) 5 MG TABS tablet Take 1 tablet (5 mg total) by mouth 2 (two) times daily. 60 tablet 0   Current Facility-Administered Medications  Medication Dose Route Frequency Provider Last Rate Last Dose  . betamethasone acetate-betamethasone sodium phosphate (CELESTONE) injection 3 mg  3 mg Intramuscular Once Gala Lewandowsky M, DPM      . betamethasone acetate-betamethasone sodium phosphate (CELESTONE) injection 3 mg  3 mg Intramuscular Once Gala Lewandowsky M, DPM      . betamethasone  acetate-betamethasone sodium phosphate (CELESTONE) injection 3 mg  3 mg Intramuscular Once Gala Lewandowsky M, DPM      . betamethasone acetate-betamethasone sodium phosphate (CELESTONE) injection 3 mg  3 mg Intramuscular Once Gala Lewandowsky M, DPM      . betamethasone acetate-betamethasone sodium phosphate (CELESTONE) injection 3 mg  3 mg Intramuscular Once Felecia Shelling, DPM          .  PHYSICAL EXAMINATION: ECOG PERFORMANCE STATUS: 0 - Asymptomatic  Vitals:   06/17/17 1048  BP: 129/81  Pulse: (!) 55  Resp: 16  Temp: 97.9 F (36.6 C)   Filed Weights   06/17/17 1048  Weight: 196 lb 3.2 oz (89 kg)    GENERAL: Well-nourished well-developed; Alert, no distress and comfortable.   Alone. EYES: no pallor or icterus OROPHARYNX: no thrush or ulceration; good dentition  NECK: supple, no masses felt LYMPH:  no palpable lymphadenopathy in the cervical, axillary or inguinal regions LUNGS: clear to auscultation and  No wheeze or crackles HEART/CVS: regular rate & rhythm and no murmurs; No lower extremity edema ABDOMEN: abdomen soft, non-tender and normal bowel sounds Musculoskeletal:no cyanosis of digits and no clubbing  PSYCH: alert & oriented x 3 with fluent speech NEURO: no focal motor/sensory deficits SKIN:  no rashes or significant lesions  LABORATORY DATA:  I have reviewed the data as listed Lab Results  Component Value Date   WBC 4.3 05/15/2017   HGB 12.9 05/15/2017   HCT 38.2 05/15/2017   MCV 98.3 05/15/2017   PLT 110 (L) 05/15/2017   Recent Labs    07/13/16 1130 12/17/16 1613 05/13/17 1137 05/14/17 0254 05/15/17 0547  NA  --  140 132* 139 137  K  --  4.5 3.7 3.5 3.4*  CL  --  103 97* 107 106  CO2  --  29 17* 24 22  GLUCOSE  --  79 128* 82 89  BUN  --  10 14 9 6   CREATININE  --  0.69 0.97 0.60 0.62  CALCIUM  --  9.6 8.8* 7.8* 8.1*  GFRNONAA  --   --  >60 >60 >60  GFRAA  --   --  >60 >60 >60  PROT 7.7 7.2 7.8 6.3*  --   ALBUMIN 3.7 3.8 3.5 2.9*  --   AST 64* 30  133* 98*  --   ALT 48 25 63* 50  --   ALKPHOS 80 66 56 42  --   BILITOT 1.7* 0.8 2.3* 0.7  --   BILIDIR 0.4  --   --   --   --   IBILI 1.3*  --   --   --   --     RADIOGRAPHIC STUDIES: I have personally reviewed the radiological images as listed and agreed with the findings in the report. No results found.  ASSESSMENT & PLAN:   Portal vein thrombosis #Likely incidental -sonographic survey demonstrates nonocclusive portal vein thrombus at the splenic vein/portal vein confluence;   HETEROZYGOUS factor V Leidenrest fo  hypercoagulable workup; including PNH/jak-2-NEG.  # Eluis co-pay assiatnace.   # will finish in mid aug 2019; will see me back in mid- July 2018/labs; visit ot [place].   Cc; Dr.Duncan.      All questions were answered. The patient knows to call the clinic with any problems, questions or concerns.       Earna Coder, MD 06/20/2017 9:51 PM

## 2017-06-17 NOTE — Assessment & Plan Note (Addendum)
#  Likely incidental - HETEROZYGOUS factor V Leiden; nonocclusive portal vein thrombus at the splenic vein/portal vein confluence.   # I suspect UTI/sepsis-question provoked DVT/in the context of heterozygous factor V Leiden abnormality.  I recommend anticoagulation approximately 6 months.  We will also recommend ultrasound prior to discontinuation.  I would not recommend indefinite anticoagulation at this time.  #Family history of malignancies-will refer to genetic counseling/next visit  #I had a long discussion the patient regarding VTE prophylaxis/during flight to [place].   # will finish in mid aug 2019; will see me back in mid- July 2018/labs; visit to [place].   Cc; Dr.Duncan.

## 2017-06-17 NOTE — Telephone Encounter (Signed)
I returned phone call to patient. She states that she just needs a refill of Eliquis sent in to her pharmacy. I have e-scribed this medication to her preferred pharmacy, and I notified the patient. She verbalized understanding, & thanked me for calling.

## 2017-06-22 ENCOUNTER — Other Ambulatory Visit: Payer: Self-pay | Admitting: Family Medicine

## 2017-06-29 ENCOUNTER — Ambulatory Visit: Payer: BLUE CROSS/BLUE SHIELD | Admitting: Family Medicine

## 2017-06-29 ENCOUNTER — Encounter: Payer: Self-pay | Admitting: Family Medicine

## 2017-06-29 ENCOUNTER — Ambulatory Visit (INDEPENDENT_AMBULATORY_CARE_PROVIDER_SITE_OTHER)
Admission: RE | Admit: 2017-06-29 | Discharge: 2017-06-29 | Disposition: A | Discharge status: Home / Self Care | Payer: BLUE CROSS/BLUE SHIELD | Source: Ambulatory Visit | Attending: Family Medicine | Admitting: Family Medicine

## 2017-06-29 VITALS — BP 130/90 | HR 61 | Temp 98.6°F | Wt 192.0 lb

## 2017-06-29 DIAGNOSIS — M25561 Pain in right knee: Secondary | ICD-10-CM | POA: Diagnosis not present

## 2017-06-29 DIAGNOSIS — E538 Deficiency of other specified B group vitamins: Secondary | ICD-10-CM

## 2017-06-29 MED ORDER — CYANOCOBALAMIN 1000 MCG/ML IJ SOLN
1000.0000 ug | Freq: Once | INTRAMUSCULAR | Status: AC
  Administered 2017-06-29: 1000 ug via INTRAMUSCULAR

## 2017-06-29 NOTE — Progress Notes (Signed)
R knee pain.  Still on eliquis.  Restarted meloxicam 15mg  with knee pain, had been off prev.  Still on PPI.  Able to tolerate meloxicam as is. She was in a parking lot and she missed a step, fell onto the R knee.  This was 06/05/17.  Still painful when rolling over in the bed.  She iced it at the time.  Minimal swelling.  Prev with bruising.  Can still bear weight, with pain.  Squatting hurts.    She saw hematology re: portal vein thrombosis.  She is on anticoagulation per hematology.  She is maintaining her sobriety.  I encouraged her.    Meds, vitals, and allergies reviewed.   ROS: Per HPI unless specifically indicated in ROS section   nad ncat Able to bear weight.  RA knee with chronic skin changes and ttp at the R tibial tubercle.  The knee has normal range of motion otherwise.  Not puffy otherwise.  Not tender to palpation on the medial or lateral joint line.  ACL, MCL, LCL all feel solid.

## 2017-06-29 NOTE — Patient Instructions (Signed)
Continue with B12 shots as is.  Use ice for 5 minutes at a time with cloth between ice and skin.  Use a knee sleeve.  Update me as needed.  Take care.  Glad to see you.

## 2017-06-30 DIAGNOSIS — M25561 Pain in right knee: Secondary | ICD-10-CM | POA: Insufficient documentation

## 2017-06-30 NOTE — Assessment & Plan Note (Signed)
Imaging reviewed.  No fracture.  Discussed with patient.  See overread on xray.  Likely soft tissue irritation. Use ice for 5 minutes at a time with cloth between ice and skin.  Use a knee sleeve.  Update me as needed.  She agrees.

## 2017-07-01 ENCOUNTER — Ambulatory Visit: Payer: BLUE CROSS/BLUE SHIELD

## 2017-07-13 ENCOUNTER — Ambulatory Visit (INDEPENDENT_AMBULATORY_CARE_PROVIDER_SITE_OTHER): Payer: BLUE CROSS/BLUE SHIELD

## 2017-07-13 ENCOUNTER — Other Ambulatory Visit: Payer: Self-pay | Admitting: Internal Medicine

## 2017-07-13 DIAGNOSIS — E538 Deficiency of other specified B group vitamins: Secondary | ICD-10-CM | POA: Diagnosis not present

## 2017-07-13 MED ORDER — CYANOCOBALAMIN 1000 MCG/ML IJ SOLN
1000.0000 ug | Freq: Once | INTRAMUSCULAR | Status: AC
  Administered 2017-07-13: 1000 ug via INTRAMUSCULAR

## 2017-07-13 NOTE — Progress Notes (Signed)
Patient in office today for bi-weekly B12 injection. Tolerated injection well to right deltoid.

## 2017-07-15 ENCOUNTER — Other Ambulatory Visit: Payer: Self-pay | Admitting: Family Medicine

## 2017-07-15 NOTE — Telephone Encounter (Signed)
Electronic refill request.  Last office visit:   06/29/17 Last Filled:   Tramadol  60 tablet 0 05/27/2017  Last Filled:   Ondansetron  30 tablet 2 03/07/2017  Please advise.

## 2017-07-16 NOTE — Telephone Encounter (Signed)
Sent. Thanks.   

## 2017-08-03 ENCOUNTER — Ambulatory Visit (INDEPENDENT_AMBULATORY_CARE_PROVIDER_SITE_OTHER): Payer: BLUE CROSS/BLUE SHIELD | Admitting: Emergency Medicine

## 2017-08-03 DIAGNOSIS — E538 Deficiency of other specified B group vitamins: Secondary | ICD-10-CM

## 2017-08-03 MED ORDER — CYANOCOBALAMIN 1000 MCG/ML IJ SOLN
1000.0000 ug | Freq: Once | INTRAMUSCULAR | Status: AC
  Administered 2017-08-03: 1000 ug via INTRAMUSCULAR

## 2017-08-17 ENCOUNTER — Ambulatory Visit: Payer: BLUE CROSS/BLUE SHIELD

## 2017-08-19 ENCOUNTER — Ambulatory Visit (INDEPENDENT_AMBULATORY_CARE_PROVIDER_SITE_OTHER): Payer: BLUE CROSS/BLUE SHIELD | Admitting: Emergency Medicine

## 2017-08-19 DIAGNOSIS — E538 Deficiency of other specified B group vitamins: Secondary | ICD-10-CM

## 2017-08-19 MED ORDER — CYANOCOBALAMIN 1000 MCG/ML IJ SOLN
1000.0000 ug | Freq: Once | INTRAMUSCULAR | Status: AC
  Administered 2017-08-19: 1000 ug via INTRAMUSCULAR

## 2017-08-19 NOTE — Progress Notes (Signed)
Patient in office today for B12. Received in Right deltoid. Tolerated injection well.

## 2017-08-30 ENCOUNTER — Other Ambulatory Visit: Payer: Self-pay | Admitting: Family Medicine

## 2017-08-31 NOTE — Telephone Encounter (Signed)
Electronic refill request Last refill 07/16/17 #60 Last office visit 06/29/17 See allergy/contraindication

## 2017-09-01 NOTE — Telephone Encounter (Signed)
Sent. Thanks.   

## 2017-09-07 ENCOUNTER — Ambulatory Visit (INDEPENDENT_AMBULATORY_CARE_PROVIDER_SITE_OTHER): Payer: BLUE CROSS/BLUE SHIELD

## 2017-09-07 DIAGNOSIS — E538 Deficiency of other specified B group vitamins: Secondary | ICD-10-CM

## 2017-09-07 MED ORDER — CYANOCOBALAMIN 1000 MCG/ML IJ SOLN
1000.0000 ug | Freq: Once | INTRAMUSCULAR | Status: AC
  Administered 2017-09-07: 1000 ug via INTRAMUSCULAR

## 2017-09-07 NOTE — Progress Notes (Signed)
Bi-Monthly B12 Injection given today in left arm.

## 2017-09-14 ENCOUNTER — Emergency Department
Admission: EM | Admit: 2017-09-14 | Discharge: 2017-09-14 | Disposition: A | Discharge status: Home / Self Care | Payer: BLUE CROSS/BLUE SHIELD | Attending: Emergency Medicine | Admitting: Emergency Medicine

## 2017-09-14 ENCOUNTER — Emergency Department: Payer: BLUE CROSS/BLUE SHIELD

## 2017-09-14 ENCOUNTER — Ambulatory Visit: Payer: Self-pay

## 2017-09-14 ENCOUNTER — Other Ambulatory Visit: Payer: Self-pay

## 2017-09-14 DIAGNOSIS — I10 Essential (primary) hypertension: Secondary | ICD-10-CM | POA: Insufficient documentation

## 2017-09-14 DIAGNOSIS — R109 Unspecified abdominal pain: Secondary | ICD-10-CM | POA: Insufficient documentation

## 2017-09-14 DIAGNOSIS — R079 Chest pain, unspecified: Secondary | ICD-10-CM | POA: Insufficient documentation

## 2017-09-14 DIAGNOSIS — K859 Acute pancreatitis without necrosis or infection, unspecified: Secondary | ICD-10-CM | POA: Diagnosis not present

## 2017-09-14 DIAGNOSIS — J45909 Unspecified asthma, uncomplicated: Secondary | ICD-10-CM | POA: Diagnosis not present

## 2017-09-14 DIAGNOSIS — F1020 Alcohol dependence, uncomplicated: Secondary | ICD-10-CM | POA: Insufficient documentation

## 2017-09-14 DIAGNOSIS — Z79899 Other long term (current) drug therapy: Secondary | ICD-10-CM | POA: Diagnosis not present

## 2017-09-14 LAB — BASIC METABOLIC PANEL
ANION GAP: 12 (ref 5–15)
BUN: 8 mg/dL (ref 6–20)
CHLORIDE: 97 mmol/L — AB (ref 101–111)
CO2: 25 mmol/L (ref 22–32)
CREATININE: 0.69 mg/dL (ref 0.44–1.00)
Calcium: 9 mg/dL (ref 8.9–10.3)
GFR calc non Af Amer: >60 mL/min (ref >60)
Glucose, Bld: 135 mg/dL — ABNORMAL HIGH (ref 65–99)
Potassium: 3.5 mmol/L (ref 3.5–5.1)
SODIUM: 134 mmol/L — AB (ref 135–145)

## 2017-09-14 LAB — HEPATIC FUNCTION PANEL
ALK PHOS: 82 U/L (ref 38–126)
ALT: 28 U/L (ref 14–54)
AST: 56 U/L — ABNORMAL HIGH (ref 15–41)
Albumin: 3.7 g/dL (ref 3.5–5.0)
BILIRUBIN DIRECT: 0.3 mg/dL (ref 0.1–0.5)
BILIRUBIN TOTAL: 1.2 mg/dL (ref 0.3–1.2)
Indirect Bilirubin: 0.9 mg/dL (ref 0.3–0.9)
Total Protein: 7.7 g/dL (ref 6.5–8.1)

## 2017-09-14 LAB — CBC
HCT: 44.8 % (ref 35.0–47.0)
HEMOGLOBIN: 15.6 g/dL (ref 12.0–16.0)
MCH: 34 pg (ref 26.0–34.0)
MCHC: 34.9 g/dL (ref 32.0–36.0)
MCV: 97.6 fL (ref 80.0–100.0)
PLATELETS: 161 10*3/uL (ref 150–440)
RBC: 4.58 MIL/uL (ref 3.80–5.20)
RDW: 15.5 % — ABNORMAL HIGH (ref 11.5–14.5)
WBC: 3.7 10*3/uL (ref 3.6–11.0)

## 2017-09-14 LAB — TROPONIN I: Troponin I: <0.03 ng/mL (ref <0.03)

## 2017-09-14 LAB — LIPASE, BLOOD: LIPASE: 56 U/L — AB (ref 11–51)

## 2017-09-14 MED ORDER — IOPAMIDOL (ISOVUE-300) INJECTION 61%
30.0000 mL | Freq: Once | INTRAVENOUS | Status: AC | PRN
  Administered 2017-09-14: 30 mL via ORAL

## 2017-09-14 MED ORDER — HYDROCODONE-ACETAMINOPHEN 5-325 MG PO TABS: 1.0000 | ORAL_TABLET | Freq: Four times a day (QID) | ORAL | 0 refills | Status: DC | PRN

## 2017-09-14 MED ORDER — PROMETHAZINE HCL 12.5 MG PO TABS: 12.5000 mg | ORAL_TABLET | Freq: Four times a day (QID) | ORAL | 0 refills | Status: DC | PRN

## 2017-09-14 MED ORDER — IOHEXOL 300 MG/ML  SOLN
100.0000 mL | Freq: Once | INTRAMUSCULAR | Status: AC | PRN
  Administered 2017-09-14: 100 mL via INTRAVENOUS

## 2017-09-14 NOTE — ED Triage Notes (Signed)
Pt c/o substernal chest pain that radiates around to the back for the past 4 days, states "It feels like a big gas bubble like when you have laparoscopic surgery" states she has a known clot in her liver and her PCP is concerned for a PE. States she is currently taking elaquis..Marland Kitchen

## 2017-09-14 NOTE — Telephone Encounter (Signed)
Will await ER notes.  Thanks.  

## 2017-09-14 NOTE — Discharge Instructions (Signed)
Please make sure you remain well-hydrated and follow-up with your primary care physician within 2 days for recheck.  Return to the emergency department sooner for any new or worsening symptoms such as worsening pain, fevers, chills, or for any other issues whatsoever.  It was a pleasure to take care of you today, and thank you for coming to our emergency department.  If you have any questions or concerns before leaving please ask the nurse to grab me and I'm more than happy to go through your aftercare instructions again.  If you were prescribed any opioid pain medication today such as Norco, Vicodin, Percocet, morphine, hydrocodone, or oxycodone please make sure you do not drive when you are taking this medication as it can alter your ability to drive safely.  If you have any concerns once you are home that you are not improving or are in fact getting worse before you can make it to your follow-up appointment, please do not hesitate to call 911 and come back for further evaluation.  Merrily Brittle, MD  Results for orders placed or performed during the hospital encounter of 09/14/17  Basic metabolic panel  Result Value Ref Range   Sodium 134 (L) 135 - 145 mmol/L   Potassium 3.5 3.5 - 5.1 mmol/L   Chloride 97 (L) 101 - 111 mmol/L   CO2 25 22 - 32 mmol/L   Glucose, Bld 135 (H) 65 - 99 mg/dL   BUN 8 6 - 20 mg/dL   Creatinine, Ser 1.61 0.44 - 1.00 mg/dL   Calcium 9.0 8.9 - 09.6 mg/dL   GFR calc non Af Amer >60 >60 mL/min   GFR calc Af Amer >60 >60 mL/min   Anion gap 12 5 - 15  CBC  Result Value Ref Range   WBC 3.7 3.6 - 11.0 K/uL   RBC 4.58 3.80 - 5.20 MIL/uL   Hemoglobin 15.6 12.0 - 16.0 g/dL   HCT 04.5 40.9 - 81.1 %   MCV 97.6 80.0 - 100.0 fL   MCH 34.0 26.0 - 34.0 pg   MCHC 34.9 32.0 - 36.0 g/dL   RDW 91.4 (H) 78.2 - 95.6 %   Platelets 161 150 - 440 K/uL  Troponin I  Result Value Ref Range   Troponin I <0.03 <0.03 ng/mL  Hepatic function panel  Result Value Ref Range   Total  Protein 7.7 6.5 - 8.1 g/dL   Albumin 3.7 3.5 - 5.0 g/dL   AST 56 (H) 15 - 41 U/L   ALT 28 14 - 54 U/L   Alkaline Phosphatase 82 38 - 126 U/L   Total Bilirubin 1.2 0.3 - 1.2 mg/dL   Bilirubin, Direct 0.3 0.1 - 0.5 mg/dL   Indirect Bilirubin 0.9 0.3 - 0.9 mg/dL  Lipase, blood  Result Value Ref Range   Lipase 56 (H) 11 - 51 U/L  Troponin I  Result Value Ref Range   Troponin I <0.03 <0.03 ng/mL   Dg Chest 2 View  Result Date: 09/14/2017 CLINICAL DATA:  Substernal chest pain radiating to back for 4 days. EXAM: CHEST - 2 VIEW COMPARISON:  05/13/2017 FINDINGS: The heart size and mediastinal contours are within normal limits. Both lungs are clear. The visualized skeletal structures are unremarkable. IMPRESSION: No active cardiopulmonary disease. Electronically Signed   By: Myles Rosenthal M.D.   On: 09/14/2017 13:02   Ct Abdomen Pelvis W Contrast  Result Date: 09/14/2017 CLINICAL DATA:  Nausea for 1 week with upper abdominal pain. History of cholecystectomy,  gastric bypass, and appendectomy. Known clot in liver. Patient on Eliquis. EXAM: CT ABDOMEN AND PELVIS WITH CONTRAST TECHNIQUE: Multidetector CT imaging of the abdomen and pelvis was performed using the standard protocol following bolus administration of intravenous contrast. CONTRAST:  100mL OMNIPAQUE IOHEXOL 300 MG/ML  SOLN COMPARISON:  CT scan May 23, 2017 FINDINGS: Lower chest: Mild opacity posteriorly in the medial right lung base on series 4, image 13 is a new finding. This could represent atelectasis or subtle infiltrate. Recommend clinical correlation. Mild atelectasis in the left base. Lung bases otherwise normal. No other abnormalities in the lower chest. Hepatobiliary: Hepatic steatosis. A tiny low-attenuation lesion in the hepatic dome on series 2, image 16 is stable. A few other tiny hepatic masses are likely small cysts. No suspicious masses are noted. Previous cholecystectomy. The portal vein is patent. The nonocclusive thrombus at  the junction of the portal vein and splenic vein on the May 14, 2017 ultrasound is not visualized today. Pancreas: There is mild fat stranding surrounding the pancreas. There is a small low-attenuation mass in the pancreatic tail measuring 10 mm with an attenuation of between 26 and 40 Hounsfield units, new since July of 2017 and not definitively seen in February of 2019. No other pancreatic masses noted. Spleen: Spleen is normal. Adrenals/Urinary Tract: Adrenal glands are normal. Kidneys are unremarkable. The right ureter is mildly prominent but there are no stones seen along the course of either ureter. No hydronephrosis or perinephric stranding. Stomach/Bowel: Previous gastric bypass. Remainder of the small bowel is normal. The colon is normal. Previous appendectomy. Vascular/Lymphatic: Atherosclerotic changes are seen in the nonaneurysmal aorta no adenopathy. Reproductive: Uterus and bilateral adnexa are unremarkable. Other: No free air or free fluid. Musculoskeletal: No acute or significant osseous findings. IMPRESSION: 1. Fat stranding around the pancreas, consistent with pancreatitis. 2. There is a 10 mm low-attenuation mass in the pancreatic body not definitely seen on previous studies. I suspect this is probably a mildly complicated cystic mass and is likely sequela of previous pancreatitis. The finding is non-specific however. Consider an MRI for further assessment as an outpatient to confirm the cystic nature. 3. Mild opacity posteriorly in the right lung base could represent atelectasis or subtle developing infiltrate. Recommend clinical correlation and attention on follow-up. 4. Portal vein is patent. 5. Atherosclerosis in the aorta. Electronically Signed   By: Gerome Samavid  Williams III M.D   On: 09/14/2017 16:48

## 2017-09-14 NOTE — ED Provider Notes (Signed)
[facility] Emergency Department Provider Note  ____________________________________________   First MD Initiated Contact with Patient 09/14/17 1458     (approximate)  I have reviewed the triage vital signs and the nursing notes.   HISTORY  Chief Complaint Chest Pain   HPI Abigail [name] is a 57 y.o. female who comes to the emergency department with lower chest and upper abdominal pain radiating straight around the back to her upper back for the past 4 days or so.  She is currently anticoagulated for unknown portal vein thrombus.  No history of DVT or pulmonary embolism.  She does have a history of pancreatitis multiple times in the past secondary to alcohol abuse.  She had an appendectomy and cholecystectomy remotely.  Pain is severe constant nothing seems to make it better and is clearly worse with eating associated with some nausea but no vomiting.  Past Medical History:  Diagnosis Date  . Alcoholism (HCC)   . Cholecystitis   . GERD (gastroesophageal reflux disease)   . Hypertension   . Pancreatitis 10/2015   2013 alcohol and/or gallstone  . Photosensitivity    (No formal dx of lupus) prev eval Dr. Gavin Potters in [place]  . Portal vein thrombosis 2019    Patient Active Problem List   Diagnosis Date Noted  . Right knee pain 06/30/2017  . Neck strain 06/17/2017  . Acute sinusitis 06/16/2017  . Portal vein thrombosis 05/27/2017  . Sepsis (HCC) 05/13/2017  . History of acute pancreatitis 10/26/2015  . Other migraine without status migrainosus, not intractable 09/17/2015  . Sciatica 10/10/2014  . Routine general medical examination at a health care facility 06/05/2014  . Advance care planning 06/05/2014  . Pain in joint, shoulder region 07/19/2013  . History of alcohol abuse 09/13/2012  . Anxiety 04/10/2012  . B12 deficiency 01/31/2010  . Pain in joint 01/31/2010  . Essential hypertension 08/16/2006  . ASTHMA, EXTRINSIC NOS 08/16/2006  .  ESOPHAGITIS 08/16/2006  . GERD 08/16/2006    Past Surgical History:  Procedure Laterality Date  . APPENDECTOMY     As a child  . CAT scan  11/00   [facility]  . CHOLECYSTECTOMY  04/20/2012   Procedure: LAPAROSCOPIC CHOLECYSTECTOMY WITH INTRAOPERATIVE CHOLANGIOGRAM;  Surgeon: Robyne Askew, MD;  Location: [facility] OR;  Service: General;  Laterality: N/A;  . COLONOSCOPY  2005  . DOBUTAMINE STRESS ECHO  11/00   [facility]  . DOPPLER ECHOCARDIOGRAPHY     Secondary to [place], [facility]  . ESOPHAGOGASTRODUODENOSCOPY  8/01   Esophagitis [name])  . FOOT SURGERY  04/2015   Pin in small toe  . 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 NSVD     X 2  . SBFT  08/01   [facility]  . TONSILLECTOMY     As a child  . TUBAL LIGATION  Late 20's    Prior to Admission medications   Medication Sig Start Date End Date Taking? Authorizing Provider  albuterol (PROVENTIL HFA;VENTOLIN HFA) 108 (90 Base) MCG/ACT inhaler Inhale 2 puffs into the lungs every 4 (four) hours as needed for wheezing or shortness of breath. 06/11/17   Doreene Nest, NP  cyanocobalamin (,VITAMIN B-12,) 1000 MCG/ML injection Inject 1,000 mcg into the muscle every 14 (fourteen) days.    [provider]  ELIQUIS 5 MG TABS tablet TAKE ONE TABLET BY MOUTH TWO TIMES A DAY 07/13/17   Earna Coder, MD  gabapentin (NEURONTIN) 300 MG capsule Take 1-2 capsules (300-600 mg total)  by mouth 2 (two) times daily. 11/24/16   Penumalli, Glenford Bayley, MD  HYDROcodone-acetaminophen (NORCO) 5-325 MG tablet Take 1 tablet by mouth every 6 (six) hours as needed for up to 15 doses for severe pain. 09/14/17   Merrily Brittle, MD  meloxicam (MOBIC) 15 MG tablet Take 1 tablet (15 mg total) by mouth daily. 05/28/17   Felecia Shelling, DPM  metoprolol tartrate (LOPRESSOR) 50 MG tablet TAKE 1 TABLET (50 MG TOTAL) BY MOUTH 2 (TWO) TIMES DAILY. 06/22/17   Joaquim Nam, MD  ondansetron (ZOFRAN) 4 MG tablet TAKE 1 TO 2 TABLETS BY MOUTH EVERY 8 HOURS AS NEEDED FOR NAUSEA OR  VOMITING 07/16/17   Joaquim Nam, MD  pantoprazole (PROTONIX) 40 MG tablet Take 1 tablet (40 mg total) by mouth daily. 12/25/16   Joaquim Nam, MD  promethazine (PHENERGAN) 12.5 MG tablet Take 1 tablet (12.5 mg total) by mouth every 6 (six) hours as needed for nausea or vomiting. 09/14/17   Merrily Brittle, MD  traMADol (ULTRAM) 50 MG tablet TAKE ONE TABLET BY MOUTH EVERY 8 HOURS AS NEEDED FOR PAIN 09/01/17   Joaquim Nam, MD    Allergies Latex; Atorvastatin; Dilaudid [hydromorphone hcl]; Lexapro [escitalopram oxalate]; Lisinopril; Mirtazapine; Nsaids; and Tylenol [acetaminophen]  Family History  Problem Relation Age of Onset  . Heart disease Mother        CAD, angioplasty twice with stents  . Cancer Mother 74       Breast  . Breast cancer Mother 45  . Hypertension Mother   . Peripheral Artery Disease Mother   . Hypertension Father   . Hypothyroidism Father   . Heart disease Father        MI at age 3  . Cancer Father 20       H/O renal CA  . Diabetes Sister   . Cancer Sister 57       Renal CA  . Factor V Leiden deficiency Brother   . Clotting disorder Brother   . Colon cancer Neg Hx     Social History Social History   Tobacco Use  . Smoking status: Never Smoker  . Smokeless tobacco: Never Used  Substance Use Topics  . Alcohol use: Yes    Alcohol/week: 1.8 oz    Types: 2 Glasses of wine, 1 Cans of beer per week    Comment: cvouple glsses of wine over weekdend and beer monday   . Drug use: No    Review of Systems Constitutional: No fever/chills Eyes: No visual changes. ENT: No sore throat. Cardiovascular: Positive for chest pain. Respiratory: Denies shortness of breath. Gastrointestinal: Positive for abdominal pain.  Positive for nausea, no vomiting.  No diarrhea.  No constipation. Genitourinary: Negative for dysuria. Musculoskeletal: Negative for back pain. Skin: Negative for rash. Neurological: Negative for headaches, focal weakness or  numbness.   ____________________________________________   PHYSICAL EXAM:  VITAL SIGNS: ED Triage Vitals  Enc Vitals Group     BP      Pulse      Resp      Temp      Temp src      SpO2      Weight      Height      Head Circumference      Peak Flow      Pain Score      Pain Loc      Pain Edu?      Excl. in GC?  Constitutional: Alert and oriented x4 somewhat uncomfortable appearing nontoxic no diaphoresis speaks in full clear sentences Eyes: PERRL EOMI. Head: Atraumatic. Nose: No congestion/rhinnorhea. Mouth/Throat: No trismus Neck: No stridor.   Cardiovascular: Normal rate, regular rhythm. Grossly normal heart sounds.  Good peripheral circulation. Respiratory: Normal respiratory effort.  No retractions. Lungs CTAB and moving good air Gastrointestinal: Soft abdomen no peritonitis although diffuse upper abdominal tenderness with no rebound or guarding or peritonitis Musculoskeletal: No lower extremity edema   Neurologic:  Normal speech and language. No gross focal neurologic deficits are appreciated. Skin:  Skin is warm, dry and intact. No rash noted. Psychiatric: Mood and affect are normal. Speech and behavior are normal.    ____________________________________________   DIFFERENTIAL includes but not limited to  Acute coronary syndrome, pulmonary embolism, Boerhaave syndrome, bowel obstruction, pancreatitis ____________________________________________   LABS (all labs ordered are listed, but only abnormal results are displayed)  Labs Reviewed  BASIC METABOLIC PANEL - Abnormal; Notable for the following components:      Result Value   Sodium 134 (*)    Chloride 97 (*)    Glucose, Bld 135 (*)    All other components within normal limits  CBC - Abnormal; Notable for the following components:   RDW 15.5 (*)    All other components within normal limits  HEPATIC FUNCTION PANEL - Abnormal; Notable for the following components:   AST 56 (*)    All other  components within normal limits  LIPASE, BLOOD - Abnormal; Notable for the following components:   Lipase 56 (*)    All other components within normal limits  TROPONIN I  TROPONIN I    Lab work reviewed by me with slightly elevated lipase which is nonspecific __________________________________________  EKG  ED ECG REPORT I, Merrily BrittleNeil Jahmarion Popoff, the attending physician, personally viewed and interpreted this ECG.  Date: 09/14/2017 EKG Time:  Rate: 64 Rhythm: normal sinus rhythm QRS Axis: normal Intervals: normal ST/T Wave abnormalities: normal Narrative Interpretation: no evidence of acute ischemia  ____________________________________________  RADIOLOGY  CT abdomen pelvis reviewed by me consistent with mild pancreatitis ____________________________________________   PROCEDURES  Procedure(s) performed: no  Procedures  Critical Care performed: o  Observation: no ____________________________________________   INITIAL IMPRESSION / ASSESSMENT AND PLAN / ED COURSE  Pertinent labs & imaging results that were available during my care of the patient were reviewed by me and considered in my medical decision making (see chart for details).       ----------------------------------------- 5:24 PM on 09/14/2017 -----------------------------------------  The patient's lipase is slightly elevated however her CT scan shows evidence of pancreatitis.  She is able to keep down food and water now.  I had a lengthy discussion with the patient regarding the diagnostic uncertainty however I do feel that she likely has mild pancreatitis.  I will treat her symptomatically with hydrocodone and she is requested Phenergan which I think is reasonable.  Strict return precautions have been given and the patient verbalizes understanding and agree with the plan. ____________________________________________   FINAL CLINICAL IMPRESSION(S) / ED DIAGNOSES  Final diagnoses:  Acute pancreatitis,  unspecified complication status, unspecified pancreatitis type      NEW MEDICATIONS STARTED DURING THIS VISIT:  Discharge Medication List as of 09/14/2017  5:24 PM    START taking these medications   Details  HYDROcodone-acetaminophen (NORCO) 5-325 MG tablet Take 1 tablet by mouth every 6 (six) hours as needed for up to 15 doses for severe pain., Starting Tue 09/14/2017, Print  promethazine (PHENERGAN) 12.5 MG tablet Take 1 tablet (12.5 mg total) by mouth every 6 (six) hours as needed for nausea or vomiting., Starting Tue 09/14/2017, Print         Note:  This document was prepared using Dragon voice recognition software and may include unintentional dictation errors.     Merrily Brittle, MD 09/16/17 [phone]

## 2017-09-14 NOTE — Telephone Encounter (Signed)
Patient called in with c/o "chest pain." She says "it started last Thursday, a feeling like there is a big gas bubble that won't go away and it goes through to my back. I've taken antacids and nothing has helped. Thursday-Sunday I was vomiting with food and water, nothing would stay down. I've been nauseated and still having nausea. For the first couple of days, I was sweating, no difficulty breathing, but the pain makes it hard to catch my breath. I've also been dizzy some." Patient advised to go to the ED to be evaluated, she says "I'll go." Care advice given, patient verbalized understanding.  Reason for Disposition . SEVERE chest pain  Answer Assessment - Initial Assessment Questions 1. LOCATION: "Where does it hurt?"       Center 2. RADIATION: "Does the pain go anywhere else?" (e.g., into neck, jaw, arms, back)     Through to my back 3. ONSET: "When did the chest pain begin?" (Minutes, hours or days)      Last Thursday 4. PATTERN "Does the pain come and go, or has it been constant since it started?"  "Does it get worse with exertion?"      Constant 5. DURATION: "How long does it last" (e.g., seconds, minutes, hours)     Constant 6. SEVERITY: "How bad is the pain?"  (e.g., Scale 1-10; mild, moderate, or severe)    - MILD (1-3): doesn't interfere with normal activities     - MODERATE (4-7): interferes with normal activities or awakens from sleep    - SEVERE (8-10): excruciating pain, unable to do any normal activities       5 7. CARDIAC RISK FACTORS: "Do you have any history of heart problems or risk factors for heart disease?" (e.g., prior heart attack, angina; high blood pressure, diabetes, being overweight, high cholesterol, smoking, or strong family history of heart disease)     HTN, overweight, family history of heart disease 8. PULMONARY RISK FACTORS: "Do you have any history of lung disease?"  (e.g., blood clots in lung, asthma, emphysema, birth control pills)     No 9. CAUSE:  "What do you think is causing the chest pain?"     I don't know  10. OTHER SYMPTOMS: "Do you have any other symptoms?" (e.g., dizziness, nausea, vomiting, sweating, fever, difficulty breathing, cough)       Nausea, dizziness, vomiting from Thurs-Sunday, sweating first couple of days 11. PREGNANCY: "Is there any chance you are pregnant?" "When was your last menstrual period?"       No  Protocols used: CHEST PAIN-A-AH

## 2017-09-15 ENCOUNTER — Telehealth: Payer: Self-pay | Admitting: Family Medicine

## 2017-09-15 DIAGNOSIS — K862 Cyst of pancreas: Secondary | ICD-10-CM

## 2017-09-15 NOTE — Telephone Encounter (Signed)
Please get update on patient.  Recent ER visit, needs f/u when possible.  CT report below.  Please make sure she knows about this.  There is a 10 mm mass in the pancreatic body not definitely seen on previous studies. I suspect this is probably a cystic mass and is likely related to previous episodes of pancreatitis. The finding is non-specific however. Consider an MRI for further assessment as an outpatient to confirm the cystic nature.  I can order this when she is feeling better.    Thanks.

## 2017-09-15 NOTE — Telephone Encounter (Signed)
Message left for patient to return my call.  

## 2017-09-20 NOTE — Telephone Encounter (Signed)
Patient advised and is willing to do the MRI.  Patient can go any day except Wednesdays.

## 2017-09-21 ENCOUNTER — Ambulatory Visit (INDEPENDENT_AMBULATORY_CARE_PROVIDER_SITE_OTHER): Payer: BLUE CROSS/BLUE SHIELD

## 2017-09-21 DIAGNOSIS — E538 Deficiency of other specified B group vitamins: Secondary | ICD-10-CM

## 2017-09-21 MED ORDER — CYANOCOBALAMIN 1000 MCG/ML IJ SOLN
1000.0000 ug | Freq: Once | INTRAMUSCULAR | Status: AC
  Administered 2017-09-21: 1000 ug via INTRAMUSCULAR

## 2017-09-21 NOTE — Progress Notes (Signed)
Pt given bi-weekly B-12 injection in right arm.

## 2017-09-22 NOTE — Addendum Note (Signed)
Addended by: Joaquim NamUNCAN, Clif Serio S on: 09/22/2017 10:26 AM   Modules accepted: Orders

## 2017-09-22 NOTE — Telephone Encounter (Addendum)
Pt returning call stating that her mother did pass away and the funeral was earlier today.  Pt asking if possible if something could be called in for anxiety. Pt stsates in the past she has taken paxil and xanax without side effects. Would [facility]. Pt verbalized understanding with imaging being sent to radiology.

## 2017-09-22 NOTE — Telephone Encounter (Signed)
Please read off of this prior to calling patient.   I talked with Lugene yesterday and (per Lugene who evidently knew the patient's mother) the patient's mother had passed away very recently.  If possible verify this and let me know and please extend my condolences.   Let me know if I can do anything.   About the f/u imaging- I verified the type of MR with radiology.  I put in the order.  Thanks.

## 2017-09-22 NOTE — Telephone Encounter (Signed)
Left message on voicemail for patient to call back. 

## 2017-09-23 ENCOUNTER — Encounter: Payer: Self-pay | Admitting: Family Medicine

## 2017-09-23 MED ORDER — HYDROXYZINE HCL 10 MG PO TABS: 10.0000 mg | ORAL_TABLET | Freq: Three times a day (TID) | ORAL | 0 refills | Status: DC | PRN

## 2017-09-23 NOTE — Telephone Encounter (Signed)
Patient says she just needs something to help her sleep.  Doesn't want a high dosage of anything.

## 2017-09-23 NOTE — Addendum Note (Signed)
Addended by: Joaquim NamUNCAN, GRAHAM S on: 09/23/2017 02:06 PM   Modules accepted: Orders

## 2017-09-23 NOTE — Telephone Encounter (Signed)
Left detailed message on voicemail.  

## 2017-09-23 NOTE — Telephone Encounter (Signed)
I would try the hydroxyzine first and if that doesn't help then let me know.  Thanks.

## 2017-09-23 NOTE — Telephone Encounter (Signed)
I would try taking hydroxyzine initially if she needs it.  That would be safer than xanax.  If she needs is frequently, then we can send in paxil rx.  Update me as needed.  Thanks.

## 2017-09-27 ENCOUNTER — Telehealth: Payer: Self-pay | Admitting: Family Medicine

## 2017-09-27 DIAGNOSIS — K861 Other chronic pancreatitis: Secondary | ICD-10-CM

## 2017-09-27 DIAGNOSIS — K8689 Other specified diseases of pancreas: Secondary | ICD-10-CM

## 2017-09-27 DIAGNOSIS — K862 Cyst of pancreas: Secondary | ICD-10-CM

## 2017-09-27 NOTE — Telephone Encounter (Signed)
Spoke with Kylie and advised that the order is corrected and I attached the authorization number to the right [employer].-Anastasiya V Hopkins, RMA

## 2017-09-27 NOTE — Telephone Encounter (Signed)
Copied from CRM [id number]. Topic: Quick Communication - See Telephone Encounter >> Sep 27, 2017 10:35 AM Floria RavelingStovall, Shana A wrote: CRM for notification. See Telephone encounter for: 09/27/17. [facility] .  She stated that the CBT code was wrong and she needed answer or new code by 3 today.  She stated that if we find something before 12:30 called Enrique SackKendra at [phone] or if it is after 12:30 contact Kylie at [phone]

## 2017-09-27 NOTE — Telephone Encounter (Signed)
Dr Reece AgarG. can you please change the order for the right one. I spoke with Dr Para Marchuncan on Friday. It needs to be MRI Abdomen W WO Contrast instead of MRA that was put in. I need this done by 12:30 pm if possible. Please let me know when this is dine, thank Neta Mendsyou-Esmirna Ravan V Lexx Monte, RMA

## 2017-09-27 NOTE — Telephone Encounter (Signed)
New MRI order placed for pancreatic mass with recent pancreatitis.

## 2017-09-28 ENCOUNTER — Ambulatory Visit (HOSPITAL_COMMUNITY)
Admission: RE | Admit: 2017-09-28 | Discharge: 2017-09-28 | Disposition: A | Discharge status: Home / Self Care | Payer: BLUE CROSS/BLUE SHIELD | Source: Ambulatory Visit | Attending: Family Medicine | Admitting: Family Medicine

## 2017-09-28 DIAGNOSIS — K862 Cyst of pancreas: Secondary | ICD-10-CM

## 2017-09-28 DIAGNOSIS — K861 Other chronic pancreatitis: Secondary | ICD-10-CM | POA: Diagnosis not present

## 2017-09-28 DIAGNOSIS — K760 Fatty (change of) liver, not elsewhere classified: Secondary | ICD-10-CM | POA: Insufficient documentation

## 2017-09-28 DIAGNOSIS — Z9049 Acquired absence of other specified parts of digestive tract: Secondary | ICD-10-CM | POA: Insufficient documentation

## 2017-09-28 DIAGNOSIS — K8689 Other specified diseases of pancreas: Secondary | ICD-10-CM

## 2017-09-28 MED ORDER — GADOBENATE DIMEGLUMINE 529 MG/ML IV SOLN
20.0000 mL | Freq: Once | INTRAVENOUS | Status: AC | PRN
  Administered 2017-09-28: 18 mL via INTRAVENOUS

## 2017-10-01 ENCOUNTER — Encounter: Payer: Self-pay | Admitting: *Deleted

## 2017-10-05 ENCOUNTER — Ambulatory Visit: Payer: BLUE CROSS/BLUE SHIELD

## 2017-10-13 ENCOUNTER — Other Ambulatory Visit: Payer: Self-pay | Admitting: Podiatry

## 2017-10-14 ENCOUNTER — Ambulatory Visit: Payer: BLUE CROSS/BLUE SHIELD | Admitting: Internal Medicine

## 2017-10-14 ENCOUNTER — Other Ambulatory Visit: Payer: BLUE CROSS/BLUE SHIELD

## 2017-10-19 ENCOUNTER — Ambulatory Visit (INDEPENDENT_AMBULATORY_CARE_PROVIDER_SITE_OTHER): Payer: BLUE CROSS/BLUE SHIELD

## 2017-10-19 DIAGNOSIS — E538 Deficiency of other specified B group vitamins: Secondary | ICD-10-CM | POA: Diagnosis not present

## 2017-10-19 MED ORDER — CYANOCOBALAMIN 1000 MCG/ML IJ SOLN
1000.0000 ug | Freq: Once | INTRAMUSCULAR | Status: AC
  Administered 2017-10-19: 1000 ug via INTRAMUSCULAR

## 2017-10-19 NOTE — Progress Notes (Signed)
Per orders of Dr. Duncan, injection of Vit B 12 given by Micajah Dennin. Patient tolerated injection well. 

## 2017-10-20 ENCOUNTER — Other Ambulatory Visit: Payer: Self-pay | Admitting: *Deleted

## 2017-10-20 DIAGNOSIS — I81 Portal vein thrombosis: Secondary | ICD-10-CM

## 2017-10-20 NOTE — Progress Notes (Signed)
Agree. Thanks

## 2017-10-22 ENCOUNTER — Inpatient Hospital Stay: Payer: BLUE CROSS/BLUE SHIELD

## 2017-10-22 ENCOUNTER — Other Ambulatory Visit: Payer: Self-pay

## 2017-10-22 ENCOUNTER — Inpatient Hospital Stay: Payer: BLUE CROSS/BLUE SHIELD | Admitting: Internal Medicine

## 2017-10-22 MED ORDER — MELOXICAM 15 MG PO TABS: 15.0000 mg | ORAL_TABLET | Freq: Every day | ORAL | 1 refills | Status: DC

## 2017-10-22 NOTE — Telephone Encounter (Signed)
Pharmacy refill request for Meloxicam 15mg   Per Dr. Evans verbal order, ok to refill.   Script has been sent to pharmacy 

## 2017-11-01 ENCOUNTER — Other Ambulatory Visit: Payer: Self-pay | Admitting: Family Medicine

## 2017-11-01 NOTE — Telephone Encounter (Signed)
Name of Medication: Tramadol [facility] of [facility],  Last [place] or Written Date and Quantity:  60 tablet 0 09/01/2017  Last Office Visit and Type: 06/29/17 acute Next Office Visit and Type: None Last Controlled Substance Agreement Date: None Last UDS: None

## 2017-11-02 NOTE — Telephone Encounter (Signed)
Sent. Thanks.   

## 2017-11-04 ENCOUNTER — Ambulatory Visit (INDEPENDENT_AMBULATORY_CARE_PROVIDER_SITE_OTHER): Payer: BLUE CROSS/BLUE SHIELD | Admitting: *Deleted

## 2017-11-04 DIAGNOSIS — E538 Deficiency of other specified B group vitamins: Secondary | ICD-10-CM | POA: Diagnosis not present

## 2017-11-04 MED ORDER — CYANOCOBALAMIN 1000 MCG/ML IJ SOLN
1000.0000 ug | Freq: Once | INTRAMUSCULAR | Status: AC
Start: 2017-11-04 — End: 2017-11-04
  Administered 2017-11-04: 1000 ug via INTRAMUSCULAR

## 2017-11-04 NOTE — Progress Notes (Signed)
Per orders of Dr. Duncan, injection of B12 given by Wadie Liew M. Patient tolerated injection well. 

## 2017-11-19 ENCOUNTER — Other Ambulatory Visit: Payer: Self-pay

## 2017-11-19 ENCOUNTER — Inpatient Hospital Stay: Payer: BLUE CROSS/BLUE SHIELD | Attending: Internal Medicine

## 2017-11-19 ENCOUNTER — Encounter: Payer: Self-pay | Admitting: Internal Medicine

## 2017-11-19 ENCOUNTER — Inpatient Hospital Stay: Payer: BLUE CROSS/BLUE SHIELD | Admitting: Internal Medicine

## 2017-11-19 VITALS — BP 153/89 | HR 59 | Temp 97.9°F | Resp 20 | Ht 60.0 in | Wt 184.0 lb

## 2017-11-19 DIAGNOSIS — F419 Anxiety disorder, unspecified: Secondary | ICD-10-CM

## 2017-11-19 DIAGNOSIS — F1020 Alcohol dependence, uncomplicated: Secondary | ICD-10-CM

## 2017-11-19 DIAGNOSIS — I81 Portal vein thrombosis: Secondary | ICD-10-CM

## 2017-11-19 DIAGNOSIS — Z7902 Long term (current) use of antithrombotics/antiplatelets: Secondary | ICD-10-CM | POA: Diagnosis not present

## 2017-11-19 DIAGNOSIS — I10 Essential (primary) hypertension: Secondary | ICD-10-CM

## 2017-11-19 DIAGNOSIS — Z7901 Long term (current) use of anticoagulants: Secondary | ICD-10-CM | POA: Diagnosis not present

## 2017-11-19 DIAGNOSIS — D6851 Activated protein C resistance: Secondary | ICD-10-CM | POA: Diagnosis not present

## 2017-11-19 LAB — COMPREHENSIVE METABOLIC PANEL
ALK PHOS: 90 U/L (ref 38–126)
ALT: 32 U/L (ref 0–44)
ANION GAP: 10 (ref 5–15)
AST: 56 U/L — ABNORMAL HIGH (ref 15–41)
Albumin: 3.4 g/dL — ABNORMAL LOW (ref 3.5–5.0)
BILIRUBIN TOTAL: 0.8 mg/dL (ref 0.3–1.2)
BUN: 7 mg/dL (ref 6–20)
CALCIUM: 8.7 mg/dL — AB (ref 8.9–10.3)
CO2: 22 mmol/L (ref 22–32)
Chloride: 106 mmol/L (ref 98–111)
Creatinine, Ser: 0.51 mg/dL (ref 0.44–1.00)
GFR calc non Af Amer: >60 mL/min (ref >60)
Glucose, Bld: 91 mg/dL (ref 70–99)
POTASSIUM: 4.4 mmol/L (ref 3.5–5.1)
Sodium: 138 mmol/L (ref 135–145)
Total Protein: 6.8 g/dL (ref 6.5–8.1)

## 2017-11-19 LAB — CBC WITH DIFFERENTIAL/PLATELET
BASOS ABS: 0 10*3/uL (ref 0–0.1)
BASOS PCT: 1 %
Eosinophils Absolute: 0 10*3/uL (ref 0–0.7)
Eosinophils Relative: 1 %
HEMATOCRIT: 45 % (ref 35.0–47.0)
HEMOGLOBIN: 15.2 g/dL (ref 12.0–16.0)
Lymphocytes Relative: 30 %
Lymphs Abs: 1.1 10*3/uL (ref 1.0–3.6)
MCH: 33.2 pg (ref 26.0–34.0)
MCHC: 33.8 g/dL (ref 32.0–36.0)
MCV: 98.2 fL (ref 80.0–100.0)
Monocytes Absolute: 0.4 10*3/uL (ref 0.2–0.9)
Monocytes Relative: 11 %
NEUTROS ABS: 2.1 10*3/uL (ref 1.4–6.5)
NEUTROS PCT: 57 %
Platelets: 178 10*3/uL (ref 150–440)
RBC: 4.58 MIL/uL (ref 3.80–5.20)
RDW: 14 % (ref 11.5–14.5)
WBC: 3.7 10*3/uL (ref 3.6–11.0)

## 2017-11-19 NOTE — Assessment & Plan Note (Addendum)
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  Provoked-sepsis/UTI.  #Continue anticoagulation with Eliquis for 1 more month; and then stop. [Total of about 6 months.]  #Family history of malignancies-will refer to genetic counseling.  #Anxiety disorder/difficulty sleeping-defer to PCP.  # follow up in 4 months/cbc/d-dimer; stop Eliquis in mid sepetmber 2019.  Cc; Dr.Duncan.

## 2017-11-19 NOTE — Progress Notes (Signed)
[facility] CONSULT NOTE  Patient Care Team: Joaquim Nam, MD as PCP - General (Family Medicine)  CHIEF COMPLAINTS/PURPOSE OF CONSULTATION: Portal vein thrombosis  # FEB 2019- Portal vein thrombosis/non-occlusive [incidental-E.coli sepsis/UTI; Brother- factor V leiden] on eliquis; Feb 2019- FACTOR V LEIDEN HETEROZYGOUS; APS/PNH/Jak-2 NEG. STOP eliquis in SEP mid 2019   # Hx of pancreatitis [sec to alcohol/gall stones; prior Hx of pancreatitis-sec to alcohol abuse; Milford Cage of her sister with the kidney cancer at 1; died at 49].]   No history exists.     HISTORY OF PRESENTING ILLNESS:  Abigail [name] 57 y.o.  female history of portal vein thrombosis-currently on Eliquis is here for follow-up.  Patient denies any blood in stools or black or stools.  Denies any worsening abdominal pain.  She had a good trip to [place]/cousin's wedding.  ROS: A complete 10 point review of system is done which is negative except mentioned above in history of present illness  MEDICAL HISTORY:  Past Medical History:  Diagnosis Date  . Alcoholism (HCC)   . Cholecystitis   . GERD (gastroesophageal reflux disease)   . Hypertension   . Pancreatitis 10/2015   2013 alcohol and/or gallstone  . Photosensitivity    (No formal dx of lupus) prev eval Dr. Gavin Potters in [place]  . Portal vein thrombosis 2019    SURGICAL HISTORY: Past Surgical History:  Procedure Laterality Date  . APPENDECTOMY     As a child  . CAT scan  11/00   wnl  . CHOLECYSTECTOMY  04/20/2012   Procedure: LAPAROSCOPIC CHOLECYSTECTOMY WITH INTRAOPERATIVE CHOLANGIOGRAM;  Surgeon: Robyne Askew, MD;  Location: [facility] OR;  Service: General;  Laterality: N/A;  . COLONOSCOPY  2005  . DOBUTAMINE STRESS ECHO  11/00   wnl  . DOPPLER ECHOCARDIOGRAPHY     Secondary to Redux, wnl  . ESOPHAGOGASTRODUODENOSCOPY  8/01   Esophagitis [name])  . FOOT SURGERY  04/2015   Pin in small toe  . Laparoscopic roux-en-Y, gastric bypass   12/05/03  . NSVD     X 2  . SBFT  08/01   wnl  . TONSILLECTOMY     As a child  . TUBAL LIGATION  Late 20's    SOCIAL HISTORY: Social History   Socioeconomic History  . Marital status: Divorced    Spouse name: Not on file  . Number of children: 2  . Years of education: 66  . Highest education level: Not on file  Occupational History  . Occupation: Armed forces operational officer    Comment: Furniture conservator/restorer, [employer]  Social Needs  . Financial resource strain: Not on file  . Food insecurity:    Worry: Not on file    Inability: Not on file  . Transportation needs:    Medical: Not on file    Non-medical: Not on file  Tobacco Use  . Smoking status: Never Smoker  . Smokeless tobacco: Never Used  Substance and Sexual Activity  . Alcohol use: Yes    Alcohol/week: 3.0 standard drinks    Types: 2 Glasses of wine, 1 Cans of beer per week    Comment: cvouple glsses of wine over weekdend and beer monday   . Drug use: No  . Sexual activity: Not on file  Lifestyle  . Physical activity:    Days per week: Not on file    Minutes per session: Not on file  . Stress: Not on file  Relationships  . Social connections:    Talks  on phone: Not on file    Gets together: Not on file    Attends religious service: Not on file    Active member of club or organization: Not on file    Attends meetings of clubs or organizations: Not on file    Relationship status: Not on file  . Intimate partner violence:    Fear of current or ex partner: Not on file    Emotionally abused: Not on file    Physically abused: Not on file    Forced sexual activity: Not on file  Other Topics Concern  . Not on file  Social History Narrative   Divorced, 2 grown children, lives with companion   Dental Hygienist   Caffeine use- coffee in morning    FAMILY HISTORY:  sister with the kidney cancer at 4; died at 49].  Father alive-  of kidney cancer at age of 14.  Mother had breast cancer at age 42.  Dad's brother had several cancers; 1  of dad's younger brother had colon cancer.  Family History  Problem Relation Age of Onset  . Heart disease Mother        CAD, angioplasty twice with stents  . Cancer Mother 41       Breast  . Breast cancer Mother 29  . Hypertension Mother   . Peripheral Artery Disease Mother   . Hypertension Father   . Hypothyroidism Father   . Heart disease Father        MI at age 18  . Cancer Father 48       H/O renal CA  . Diabetes Sister   . Cancer Sister 21       Renal CA  . Factor V Leiden deficiency Brother   . Clotting disorder Brother   . Colon cancer Neg Hx     ALLERGIES:  is allergic to latex; atorvastatin; dilaudid [hydromorphone hcl]; lexapro [escitalopram oxalate]; lisinopril; mirtazapine; nsaids; and tylenol [acetaminophen].  MEDICATIONS:  Current Outpatient Medications  Medication Sig Dispense Refill  . albuterol (PROVENTIL HFA;VENTOLIN HFA) 108 (90 Base) MCG/ACT inhaler Inhale 2 puffs into the lungs every 4 (four) hours as needed for wheezing or shortness of breath. 1 Inhaler 0  . cyanocobalamin (,VITAMIN B-12,) 1000 MCG/ML injection Inject 1,000 mcg into the muscle every 14 (fourteen) days.    Marland Kitchen ELIQUIS 5 MG TABS tablet TAKE ONE TABLET BY MOUTH TWO TIMES A DAY 60 tablet 4  . gabapentin (NEURONTIN) 300 MG capsule Take 1-2 capsules (300-600 mg total) by mouth 2 (two) times daily. 120 capsule 12  . meloxicam (MOBIC) 15 MG tablet Take 1 tablet (15 mg total) by mouth daily. 60 tablet 1  . metoprolol tartrate (LOPRESSOR) 50 MG tablet TAKE 1 TABLET (50 MG TOTAL) BY MOUTH 2 (TWO) TIMES DAILY. 180 tablet 1  . ondansetron (ZOFRAN) 4 MG tablet TAKE 1 TO 2 TABLETS BY MOUTH EVERY 8 HOURS AS NEEDED FOR NAUSEA OR VOMITING 30 tablet 1  . promethazine (PHENERGAN) 12.5 MG tablet Take 1 tablet (12.5 mg total) by mouth every 6 (six) hours as needed for nausea or vomiting. 30 tablet 0  . hydrOXYzine (ATARAX/VISTARIL) 10 MG tablet Take 1 tablet (10 mg total) by mouth 3 (three) times daily as needed  for anxiety. 30 tablet 0  . pantoprazole (PROTONIX) 40 MG tablet Take 1 tablet (40 mg total) by mouth daily. 90 tablet 3  . venlafaxine (EFFEXOR) 37.5 MG tablet Take 1 tablet (37.5 mg total) by mouth 2 (two) times  daily. 60 tablet 1   Current Facility-Administered Medications  Medication Dose Route Frequency Provider Last Rate Last Dose  . betamethasone acetate-betamethasone sodium phosphate (CELESTONE) injection 3 mg  3 mg Intramuscular Once Evans, Brent M, DPM      . betamethasone acetate-betamethasone sodium phosphate (CELESTONE) injection Gala Lewandowsky3 mg  3 mg Intramuscular Once Gala LewandowskyEvans, Brent M, DPM      . betamethasone acetate-betamethasone sodium phosphate (CELESTONE) injection 3 mg  3 mg Intramuscular Once Gala LewandowskyEvans, Brent M, DPM      . betamethasone acetate-betamethasone sodium phosphate (CELESTONE) injection 3 mg  3 mg Intramuscular Once Gala LewandowskyEvans, Brent M, DPM      . betamethasone acetate-betamethasone sodium phosphate (CELESTONE) injection 3 mg  3 mg Intramuscular Once Felecia ShellingEvans, Brent M, DPM          .  PHYSICAL EXAMINATION: ECOG PERFORMANCE STATUS: 0 - Asymptomatic  Vitals:   11/19/17 1429  BP: (!) 153/89  Pulse: (!) 59  Resp: 20  Temp: 97.9 F (36.6 C)   Filed Weights   11/19/17 1429  Weight: 184 lb (83.5 kg)    GENERAL: Well-nourished well-developed; Alert, no distress and comfortable.   Alone. EYES: no pallor or icterus OROPHARYNX: no thrush or ulceration; good dentition  NECK: supple, no masses felt LYMPH:  no palpable lymphadenopathy in the cervical, axillary or inguinal regions LUNGS: clear to auscultation and  No wheeze or crackles HEART/CVS: regular rate & rhythm and no murmurs; No lower extremity edema ABDOMEN: abdomen soft, non-tender and normal bowel sounds Musculoskeletal:no cyanosis of digits and no clubbing  PSYCH: alert & oriented x 3 with fluent speech NEURO: no focal motor/sensory deficits SKIN:  no rashes or significant lesions  LABORATORY DATA:  I have reviewed  the data as listed Lab Results  Component Value Date   WBC 3.7 11/19/2017   HGB 15.2 11/19/2017   HCT 45.0 11/19/2017   MCV 98.2 11/19/2017   PLT 178 11/19/2017   Recent Labs    05/14/17 0254 05/15/17 0547 09/14/17 1243 09/14/17 1504 11/19/17 1400  NA 139 137 134*  --  138  K 3.5 3.4* 3.5  --  4.4  CL 107 106 97*  --  106  CO2 24 22 25   --  22  GLUCOSE 82 89 135*  --  91  BUN 9 6 8   --  7  CREATININE 0.60 0.62 0.69  --  0.51  CALCIUM 7.8* 8.1* 9.0  --  8.7*  GFRNONAA >60 >60 >60  --  >60  GFRAA >60 >60 >60  --  >60  PROT 6.3*  --   --  7.7 6.8  ALBUMIN 2.9*  --   --  3.7 3.4*  AST 98*  --   --  56* 56*  ALT 50  --   --  28 32  ALKPHOS 42  --   --  82 90  BILITOT 0.7  --   --  1.2 0.8  BILIDIR  --   --   --  0.3  --   IBILI  --   --   --  0.9  --     RADIOGRAPHIC STUDIES: I have personally reviewed the radiological images as listed and agreed with the findings in the report. No results found.  ASSESSMENT & PLAN:   Portal vein thrombosis #  HETEROZYGOUS factor V Leiden; nonocclusive portal vein thrombus at the splenic vein/portal vein confluence.  Provoked-sepsis/UTI.  #Continue anticoagulation with Eliquis for 1 more month; and then stop. Doylene Canard[Total  of about 6 months.]  #Family history of malignancies-will refer to genetic counseling.  #Anxiety disorder/difficulty sleeping-defer to PCP.  # follow up in 4 months/cbc/d-dimer; stop Eliquis in mid sepetmber 2019.  Cc; Dr.Duncan.      All questions were answered. The patient knows to call the clinic with any problems, questions or concerns.       Earna CoderGovinda R Jayr Lupercio, MD 11/23/2017 11:12 AM

## 2017-11-22 ENCOUNTER — Ambulatory Visit: Payer: BLUE CROSS/BLUE SHIELD | Admitting: Family Medicine

## 2017-11-22 ENCOUNTER — Encounter: Payer: Self-pay | Admitting: Family Medicine

## 2017-11-22 VITALS — BP 124/80 | HR 64 | Temp 99.1°F | Ht 60.0 in | Wt 188.0 lb

## 2017-11-22 DIAGNOSIS — Z1211 Encounter for screening for malignant neoplasm of colon: Secondary | ICD-10-CM | POA: Diagnosis not present

## 2017-11-22 DIAGNOSIS — F419 Anxiety disorder, unspecified: Secondary | ICD-10-CM

## 2017-11-22 DIAGNOSIS — E538 Deficiency of other specified B group vitamins: Secondary | ICD-10-CM

## 2017-11-22 MED ORDER — CYANOCOBALAMIN 1000 MCG/ML IJ SOLN
1000.0000 ug | Freq: Once | INTRAMUSCULAR | Status: AC
Start: 2017-11-22 — End: 2017-11-22
  Administered 2017-11-22: 1000 ug via INTRAMUSCULAR

## 2017-11-22 MED ORDER — VENLAFAXINE HCL 37.5 MG PO TABS
37.5000 mg | ORAL_TABLET | Freq: Two times a day (BID) | ORAL | 1 refills | Status: DC
Start: 2017-11-22 — End: 2018-01-06

## 2017-11-22 NOTE — Patient Instructions (Signed)
Add on effexor once a day for a few days and then increase to twice a day thereafter.  Let me know how you are doing in a 1-2 weeks, sooner if needed.  Take care.  Glad to see you.

## 2017-11-22 NOTE — Progress Notes (Signed)
We talked about colon cancer screening.  She is on anticoagulation and the plan was to get cologuard done but it may be more reasonable to get genetic counseling done as she may end up needing GI eval again, ie colonoscopy, based on genetic counseling data.  D/w pt.    Prev hematology notes d/w pt, with rationale for genetics eval d/w pt. Genetics eval is pending.    We talked about her family stressors.  Her brother is ill, her sister died, she is looking after her father.    She isn't drinking now other than a glass of wine recently.  She didn't tolerate lexapro.  No SI/HI.  She is more tearful, worried.  She is nauseated from being anxious.  She is staying in bed unless she is working.  She has to have a reason to keep going out of the house, meaning she just stays at home otherwise.  Safe at home.  Meds, vitals, and allergies reviewed.   ROS: Per HPI unless specifically indicated in ROS section   GEN: nad, alert and oriented, tearful but regains composure. HEENT: mucous membranes moist NECK: supple w/o LA CV: rrr. PULM: ctab, no inc wob ABD: soft, +bs EXT: no edema SKIN: no acute rash

## 2017-11-23 ENCOUNTER — Ambulatory Visit: Payer: BLUE CROSS/BLUE SHIELD

## 2017-11-24 DIAGNOSIS — Z1211 Encounter for screening for malignant neoplasm of colon: Secondary | ICD-10-CM | POA: Insufficient documentation

## 2017-11-24 NOTE — Assessment & Plan Note (Signed)
Encouraged abstinence from alcohol.  No suicidal or homicidal intent.  Okay for outpatient follow-up.  Discussed with patient about options regarding medication. Add on effexor once a day for a few days and then increase to twice a day thereafter.  She'll let me know how she is doing in a 1-2 weeks, sooner if needed.  She agrees. >25 minutes spent in face to face time with patient, >50% spent in counselling or coordination of care.

## 2017-11-24 NOTE — Assessment & Plan Note (Signed)
We talked about colon cancer screening.  She is on anticoagulation and the plan was to get cologuard done but it may be more reasonable to get genetic counseling done as she may end up needing GI eval again, ie colonoscopy, based on genetic counseling data.  D/w pt.  I'll await the outcome of the genetic counseling.  She agrees.

## 2017-11-28 ENCOUNTER — Other Ambulatory Visit: Payer: Self-pay | Admitting: Diagnostic Neuroimaging

## 2017-12-09 ENCOUNTER — Ambulatory Visit (INDEPENDENT_AMBULATORY_CARE_PROVIDER_SITE_OTHER): Payer: BLUE CROSS/BLUE SHIELD | Admitting: *Deleted

## 2017-12-09 DIAGNOSIS — E538 Deficiency of other specified B group vitamins: Secondary | ICD-10-CM | POA: Diagnosis not present

## 2017-12-09 MED ORDER — CYANOCOBALAMIN 1000 MCG/ML IJ SOLN
1000.0000 ug | Freq: Once | INTRAMUSCULAR | Status: AC
  Administered 2017-12-09: 1000 ug via INTRAMUSCULAR

## 2017-12-09 NOTE — Progress Notes (Signed)
Per orders of Dr. Kerby Nora, injection of B12 given by Shon Millet. Patient tolerated injection well.  Routed to Dr. Ermalene Searing since PCP not in office

## 2017-12-14 ENCOUNTER — Other Ambulatory Visit: Payer: Self-pay | Admitting: Diagnostic Neuroimaging

## 2017-12-14 ENCOUNTER — Other Ambulatory Visit: Payer: Self-pay | Admitting: *Deleted

## 2017-12-14 NOTE — Telephone Encounter (Signed)
LMVM for pt that she can get refills from pcp for gabapetin if they will, if not needs appt here, once yearly for refills.  Ok to see NP if so.

## 2017-12-15 ENCOUNTER — Encounter: Payer: Self-pay | Admitting: Family Medicine

## 2017-12-15 ENCOUNTER — Other Ambulatory Visit: Payer: Self-pay | Admitting: Family Medicine

## 2017-12-19 ENCOUNTER — Other Ambulatory Visit: Payer: Self-pay | Admitting: Family Medicine

## 2017-12-19 MED ORDER — GABAPENTIN 300 MG PO CAPS: ORAL_CAPSULE | ORAL | 5 refills | Status: DC

## 2017-12-23 ENCOUNTER — Ambulatory Visit (INDEPENDENT_AMBULATORY_CARE_PROVIDER_SITE_OTHER): Payer: BLUE CROSS/BLUE SHIELD | Admitting: *Deleted

## 2017-12-23 DIAGNOSIS — E538 Deficiency of other specified B group vitamins: Secondary | ICD-10-CM | POA: Diagnosis not present

## 2017-12-23 MED ORDER — CYANOCOBALAMIN 1000 MCG/ML IJ SOLN
1000.0000 ug | Freq: Once | INTRAMUSCULAR | Status: AC
Start: 2017-12-23 — End: 2017-12-23
  Administered 2017-12-23: 1000 ug via INTRAMUSCULAR

## 2017-12-23 NOTE — Progress Notes (Signed)
Per orders of Dr. Duncan, injection of B12 1000 mcg/mL  given by Kelsie Kramp. Patient tolerated injection well.  

## 2018-01-05 ENCOUNTER — Encounter: Payer: Self-pay | Admitting: Family Medicine

## 2018-01-06 ENCOUNTER — Ambulatory Visit (INDEPENDENT_AMBULATORY_CARE_PROVIDER_SITE_OTHER): Payer: BLUE CROSS/BLUE SHIELD | Admitting: *Deleted

## 2018-01-06 ENCOUNTER — Telehealth: Payer: Self-pay | Admitting: Family Medicine

## 2018-01-06 ENCOUNTER — Other Ambulatory Visit: Payer: Self-pay | Admitting: *Deleted

## 2018-01-06 DIAGNOSIS — E538 Deficiency of other specified B group vitamins: Secondary | ICD-10-CM | POA: Diagnosis not present

## 2018-01-06 MED ORDER — MELOXICAM 15 MG PO TABS: 15.0000 mg | ORAL_TABLET | Freq: Every day | ORAL | 1 refills | Status: DC

## 2018-01-06 MED ORDER — PROMETHAZINE HCL 12.5 MG PO TABS: 12.5000 mg | ORAL_TABLET | Freq: Four times a day (QID) | ORAL | 0 refills | Status: DC | PRN

## 2018-01-06 MED ORDER — TRAMADOL HCL 50 MG PO TABS: ORAL_TABLET | ORAL | 0 refills | Status: DC

## 2018-01-06 MED ORDER — PANTOPRAZOLE SODIUM 40 MG PO TBEC: 40.0000 mg | DELAYED_RELEASE_TABLET | Freq: Every day | ORAL | 3 refills | Status: DC

## 2018-01-06 MED ORDER — CYANOCOBALAMIN 1000 MCG/ML IJ SOLN
1000.0000 ug | Freq: Once | INTRAMUSCULAR | Status: AC
  Administered 2018-01-06: 1000 ug via INTRAMUSCULAR

## 2018-01-06 MED ORDER — VENLAFAXINE HCL 37.5 MG PO TABS: 37.5000 mg | ORAL_TABLET | Freq: Two times a day (BID) | ORAL | 5 refills | Status: DC

## 2018-01-06 MED ORDER — GABAPENTIN 300 MG PO CAPS: ORAL_CAPSULE | ORAL | 5 refills | Status: DC

## 2018-01-06 NOTE — Telephone Encounter (Signed)
Noted. Patient was here for a nurse visit for B12 and flu shot.  Patient notified that she has been having some SOB.  Patient stated that she had a nonproductive cough since Monday. She felt feverish off and on yesterday. SOB. Wheezing. Been using the inhaler which helps.  Dr Para March listen to patient's lung which was clear.  Vital was taken.  BP 124/80    P 73    Ox  99    Temp  98.0   Patient was not given flu shot at this appointment.

## 2018-01-06 NOTE — Telephone Encounter (Signed)
Pt needs b12 inj in 2 weeks  10/17  Next opening is 10/30  Can she be worked in  Prefers pm  [employer] number [phone]

## 2018-01-06 NOTE — Progress Notes (Signed)
Per orders of Dr. Duncan, injection of B12 1000 mcg/mL  given by Marcile Fuquay. Patient tolerated injection well.  

## 2018-01-06 NOTE — Telephone Encounter (Signed)
Okay to continue.  rxs sent.  Thanks.

## 2018-01-06 NOTE — Telephone Encounter (Signed)
Patient sent MyChart message asking for refills to [facility] in [place].  I did the Venlafaxine and Pantoprazole but I am sending the remainder to Dr. Para March for approval.  Patient also asks for Tramadol Rx.

## 2018-01-06 NOTE — Telephone Encounter (Signed)
Triage note reviewed.  Discussed with Johny Drilling and with the patient.  She reported some previous shortness of breath but was speaking in complete sentences and appeared to be in no apparent distress.  She had felt feverish previously.  Afebrile now.  She had a nonproductive cough prev but not at time of exam.  Vitals are stable.  Mucous membranes moist.  Neck is supple.  No lymphadenopathy.  Heart is regular.  Not tachycardic.  Lungs are clear.  No wheeze.  No cough.  Extremities well perfused.  Discussed with patient about options.  Benign-appearing exam.  Hold flu shot for now.  Update [place] as needed.  She could have a resolving viral issue.  Okay for outpatient follow-up.

## 2018-01-07 NOTE — Telephone Encounter (Signed)
Spoke with patient.  She has been scheduled for her B12 shot for 10/17 at 2:30pm. Patient verbalizes understanding and approval of time.

## 2018-01-14 ENCOUNTER — Other Ambulatory Visit: Payer: Self-pay | Admitting: Diagnostic Neuroimaging

## 2018-01-20 ENCOUNTER — Ambulatory Visit (INDEPENDENT_AMBULATORY_CARE_PROVIDER_SITE_OTHER): Payer: BLUE CROSS/BLUE SHIELD

## 2018-01-20 DIAGNOSIS — E538 Deficiency of other specified B group vitamins: Secondary | ICD-10-CM

## 2018-01-20 DIAGNOSIS — Z23 Encounter for immunization: Secondary | ICD-10-CM

## 2018-01-20 MED ORDER — CYANOCOBALAMIN 1000 MCG/ML IJ SOLN
1000.0000 ug | Freq: Once | INTRAMUSCULAR | Status: AC
  Administered 2018-01-20: 1000 ug via INTRAMUSCULAR

## 2018-01-20 NOTE — Progress Notes (Signed)
Per orders of Dr. Graham Duncan, injection of Vitamin B 12 given by Damyra Luscher. Patient tolerated injection well.  

## 2018-02-03 ENCOUNTER — Ambulatory Visit (INDEPENDENT_AMBULATORY_CARE_PROVIDER_SITE_OTHER): Payer: BLUE CROSS/BLUE SHIELD | Admitting: *Deleted

## 2018-02-03 DIAGNOSIS — E538 Deficiency of other specified B group vitamins: Secondary | ICD-10-CM

## 2018-02-03 MED ORDER — CYANOCOBALAMIN 1000 MCG/ML IJ SOLN
1000.0000 ug | Freq: Once | INTRAMUSCULAR | Status: AC
  Administered 2018-02-03: 1000 ug via INTRAMUSCULAR

## 2018-02-03 NOTE — Progress Notes (Signed)
Per orders of Dr. Letvak, injection of B12 given by [place], Shapale M. Patient tolerated injection well.   

## 2018-02-05 ENCOUNTER — Encounter: Payer: Self-pay | Admitting: Family Medicine

## 2018-02-08 ENCOUNTER — Telehealth: Payer: Self-pay | Admitting: Family Medicine

## 2018-02-08 NOTE — Telephone Encounter (Signed)
See my chart message.  Please check on patient.  Please verify that she has no suicidal homicidal intent.  Please see about scheduling office visit so we can talk about her mood/grief.  Thanks.  30 minutes would be great.

## 2018-02-08 NOTE — Telephone Encounter (Signed)
Patient advised and will call tomorrow for appointment.

## 2018-02-10 ENCOUNTER — Inpatient Hospital Stay: Admit: 2018-02-10 | Payer: BLUE CROSS/BLUE SHIELD | Admitting: Orthopedic Surgery

## 2018-02-10 SURGERY — ARTHROPLASTY, HIP, TOTAL, ANTERIOR APPROACH
Anesthesia: Choice | Laterality: Left

## 2018-02-15 ENCOUNTER — Other Ambulatory Visit: Payer: Self-pay | Admitting: Podiatry

## 2018-02-24 ENCOUNTER — Ambulatory Visit (INDEPENDENT_AMBULATORY_CARE_PROVIDER_SITE_OTHER): Payer: BLUE CROSS/BLUE SHIELD

## 2018-02-24 DIAGNOSIS — E538 Deficiency of other specified B group vitamins: Secondary | ICD-10-CM | POA: Diagnosis not present

## 2018-02-24 MED ORDER — CYANOCOBALAMIN 1000 MCG/ML IJ SOLN
1000.0000 ug | Freq: Once | INTRAMUSCULAR | Status: AC
  Administered 2018-02-24: 1000 ug via INTRAMUSCULAR

## 2018-02-24 NOTE — Progress Notes (Signed)
Patient in office today for B12 injection. Tolerated injection well to right deltoid.  

## 2018-03-07 ENCOUNTER — Other Ambulatory Visit: Payer: Self-pay | Admitting: Family Medicine

## 2018-03-07 NOTE — Telephone Encounter (Signed)
Faxed refill request. Tramadol Last office visit:   11/22/17 Last Filled:    30 tablet 0 01/06/2018  Please advise.

## 2018-03-08 ENCOUNTER — Other Ambulatory Visit: Payer: Self-pay | Admitting: Family Medicine

## 2018-03-08 MED ORDER — TRAMADOL HCL 50 MG PO TABS: ORAL_TABLET | ORAL | 0 refills | Status: DC

## 2018-03-08 NOTE — Telephone Encounter (Signed)
Electronic refill request. Tramadol Last office visit:   11/22/17 Last Filled:

## 2018-03-08 NOTE — Telephone Encounter (Signed)
Sent. Thanks.   

## 2018-03-10 ENCOUNTER — Ambulatory Visit: Payer: BLUE CROSS/BLUE SHIELD

## 2018-03-15 ENCOUNTER — Encounter: Payer: Self-pay | Admitting: Family Medicine

## 2018-03-15 ENCOUNTER — Ambulatory Visit: Payer: BLUE CROSS/BLUE SHIELD | Admitting: Family Medicine

## 2018-03-15 VITALS — BP 102/78 | HR 68 | Temp 98.5°F | Ht 60.0 in | Wt 180.8 lb

## 2018-03-15 DIAGNOSIS — E538 Deficiency of other specified B group vitamins: Secondary | ICD-10-CM | POA: Diagnosis not present

## 2018-03-15 DIAGNOSIS — F419 Anxiety disorder, unspecified: Secondary | ICD-10-CM | POA: Diagnosis not present

## 2018-03-15 MED ORDER — CYANOCOBALAMIN 1000 MCG/ML IJ SOLN
1000.0000 ug | Freq: Once | INTRAMUSCULAR | Status: AC
  Administered 2018-03-15: 1000 ug via INTRAMUSCULAR

## 2018-03-15 MED ORDER — VENLAFAXINE HCL 37.5 MG PO TABS: 37.5000 mg | ORAL_TABLET | Freq: Two times a day (BID) | ORAL | Status: DC

## 2018-03-15 MED ORDER — GABAPENTIN 300 MG PO CAPS: ORAL_CAPSULE | ORAL | 5 refills | Status: DC

## 2018-03-15 NOTE — Progress Notes (Signed)
She can tell when she is due for B12 injection.  Fatigue with a trough level.  She feels better overall following dose of B12.    She is going to have repeat labs off anticoagulation in the near future.    She had less HA on 600mg  BID gabapentin.  D/w pt.  rx changed and sent.  She can gradually return to 600 mg twice a day.  She is currently taking 300 mg twice a day.  She previously felt better on venlafaxine but the effect seems to have worn off.  Still on med.  She is more irritable and having mood swings.  No SI/HI.  No etoh.  She is out of hydroxyzine.  It seemed to help at the time.  She is having trouble sleeping, from grief.  She is dreaming about the death of her mother.  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  She feels better doing for others.    Safe at home.  Taking tramadol occ for her back.  She isn't taking tylenol or nsaids.    Meds, vitals, and allergies reviewed.  ROS: Per HPI unless specifically indicated in ROS section   GEN: nad, alert and oriented, speech and judgment appear appropriate.  Affect is slightly flat but given recent events this is likely expected. HEENT: mucous membranes moist NECK: supple w/o LA CV: rrr PULM: ctab, no inc wob ABD: soft, +bs EXT: no edema SKIN: Well-perfused.

## 2018-03-15 NOTE — Patient Instructions (Addendum)
Week #1 Try increasing venlafaxine first.  Increase to 75mg  in the AM for 4 days (still taking 37.5mg  at night).  Then try taking 75mg  twice a day thereafter.   Week #2 Add on an extra 300mg  of gabapentin in the AM for 4 days.  600mg  AM, 300mg  PM for 4 days.  Then increase to 600mg  twice a day.   Update me as needed.   I'll await the notes from the blood clinic.  Take care.  Glad to see you.

## 2018-03-17 NOTE — Assessment & Plan Note (Signed)
And grief.  Discussed options.  No suicidal homicidal intent.  Sober.  She is maintaining her sobriety per her report.  She has some support at home.  She is still working and that has been good for her.  We talked about increasing her venlafaxine gradually.  She can take up to 75 mg twice a day.  She will update me as needed.  After increasing her venlafaxine she can try increasing her gabapentin back to 600 mg a day to see if that helps with headaches.  Routine cautions discussed with patient.  She agrees with plan.  Okay for outpatient follow-up.

## 2018-03-17 NOTE — Assessment & Plan Note (Signed)
Would continue replacement as is.  Her previous level was normal.  She is going to get through her upcoming round of blood work with hematology and we can go from there.

## 2018-03-18 ENCOUNTER — Encounter: Payer: Self-pay | Admitting: Internal Medicine

## 2018-03-18 ENCOUNTER — Inpatient Hospital Stay: Payer: BLUE CROSS/BLUE SHIELD

## 2018-03-18 ENCOUNTER — Inpatient Hospital Stay: Payer: BLUE CROSS/BLUE SHIELD | Attending: Internal Medicine | Admitting: Internal Medicine

## 2018-03-18 VITALS — BP 94/68 | HR 60 | Temp 98.0°F | Wt 181.8 lb

## 2018-03-18 DIAGNOSIS — D6851 Activated protein C resistance: Secondary | ICD-10-CM | POA: Diagnosis not present

## 2018-03-18 DIAGNOSIS — I10 Essential (primary) hypertension: Secondary | ICD-10-CM | POA: Insufficient documentation

## 2018-03-18 DIAGNOSIS — Z7901 Long term (current) use of anticoagulants: Secondary | ICD-10-CM | POA: Insufficient documentation

## 2018-03-18 DIAGNOSIS — I81 Portal vein thrombosis: Secondary | ICD-10-CM | POA: Diagnosis present

## 2018-03-18 DIAGNOSIS — Z79899 Other long term (current) drug therapy: Secondary | ICD-10-CM | POA: Insufficient documentation

## 2018-03-18 LAB — CBC WITH DIFFERENTIAL/PLATELET
Abs Immature Granulocytes: 0.01 10*3/uL (ref 0.00–0.07)
BASOS ABS: 0.1 10*3/uL (ref 0.0–0.1)
BASOS PCT: 1 %
Eosinophils Absolute: 0.1 10*3/uL (ref 0.0–0.5)
Eosinophils Relative: 2 %
HCT: 40.4 % (ref 36.0–46.0)
Hemoglobin: 13.4 g/dL (ref 12.0–15.0)
Immature Granulocytes: 0 %
Lymphocytes Relative: 35 %
Lymphs Abs: 2.4 10*3/uL (ref 0.7–4.0)
MCH: 32.1 pg (ref 26.0–34.0)
MCHC: 33.2 g/dL (ref 30.0–36.0)
MCV: 96.9 fL (ref 80.0–100.0)
Monocytes Absolute: 0.7 10*3/uL (ref 0.1–1.0)
Monocytes Relative: 11 %
NRBC: 0 % (ref 0.0–0.2)
Neutro Abs: 3.4 10*3/uL (ref 1.7–7.7)
Neutrophils Relative %: 51 %
Platelets: 358 10*3/uL (ref 150–400)
RBC: 4.17 MIL/uL (ref 3.87–5.11)
RDW: 14.2 % (ref 11.5–15.5)
WBC: 6.7 10*3/uL (ref 4.0–10.5)

## 2018-03-18 LAB — BASIC METABOLIC PANEL
Anion gap: 9 (ref 5–15)
BUN: <5 mg/dL — ABNORMAL LOW (ref 6–20)
CALCIUM: 8.2 mg/dL — AB (ref 8.9–10.3)
CO2: 24 mmol/L (ref 22–32)
CREATININE: 0.6 mg/dL (ref 0.44–1.00)
Chloride: 108 mmol/L (ref 98–111)
GFR calc Af Amer: >60 mL/min (ref >60)
GFR calc non Af Amer: >60 mL/min (ref >60)
Glucose, Bld: 76 mg/dL (ref 70–99)
Potassium: 4.1 mmol/L (ref 3.5–5.1)
Sodium: 141 mmol/L (ref 135–145)

## 2018-03-18 LAB — FIBRIN DERIVATIVES D-DIMER (ARMC ONLY): Fibrin derivatives D-dimer (ARMC): 1346.42 ng/mL (FEU) — ABNORMAL HIGH (ref 0.00–499.00)

## 2018-03-18 NOTE — Assessment & Plan Note (Addendum)
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  Provoked-sepsis/UTI.  Stable.  #Status post 6 months of anticoagulation with Eliquis; stopped in mid September 2019.  Await labs from today including d-dimer.   #Family history of malignancies-will refer to genetic counseling.will discuss with pt.   # DISPOSITION: # labs- today- cbc/bmp/d-dimer; # follow up TBD- Dr.B  # 25 minutes face-to-face with the patient discussing the above plan of care; more than 50% of time spent on prognosis/ natural history; counseling and coordination.  Addendum: Patient d-dimer 1300/elevated CBC BMP normal.  We will plan to repeat d-dimer in approximately 1 month.  And if still elevated consider imaging for portal vein thrombosis. Will also discuss re: genetic testing; and plan follow up. Will inform patient of the plan.   cc; Dr.Duncan.

## 2018-03-18 NOTE — Progress Notes (Signed)
[facility] CONSULT NOTE  Patient Care Team: Joaquim Nam, MD as PCP - General (Family Medicine)  CHIEF COMPLAINTS/PURPOSE OF CONSULTATION: Portal vein thrombosis  # FEB 2019- Portal vein thrombosis/non-occlusive [incidental-E.coli sepsis/UTI; Brother- factor V leiden] on eliquis; Feb 2019- FACTOR V LEIDEN HETEROZYGOUS; APS/PNH/Jak-2 NEG. STOPPED eliquis in SEP mid 2019   # Hx of pancreatitis [sec to alcohol/gall stones; prior Hx of pancreatitis-sec to alcohol abuse; Milford Cage of her sister with the kidney cancer at 47; died at 49].    No history exists.     HISTORY OF PRESENTING ILLNESS:  Abigail [name] 57 y.o.  female history of portal vein thrombosis-currently on Eliquis is here for follow-up.  As recommended patient stop Eliquis in mid September 2019.  Denies abdominal pain.  Appetite is good but no weight loss.  No nausea no vomiting.   Review of Systems  Constitutional: Negative for chills, diaphoresis, fever, malaise/fatigue and weight loss.  HENT: Negative for nosebleeds and sore throat.   Eyes: Negative for double vision.  Respiratory: Negative for cough, hemoptysis, sputum production, shortness of breath and wheezing.   Cardiovascular: Negative for chest pain, palpitations, orthopnea and leg swelling.  Gastrointestinal: Negative for abdominal pain, blood in stool, constipation, diarrhea, heartburn, melena, nausea and vomiting.  Genitourinary: Negative for dysuria, frequency and urgency.  Musculoskeletal: Negative for back pain and joint pain.  Skin: Negative.  Negative for itching and rash.  Neurological: Negative for dizziness, tingling, focal weakness, weakness and headaches.  Endo/Heme/Allergies: Does not bruise/bleed easily.  Psychiatric/Behavioral: Negative for depression. The patient is not nervous/anxious and does not have insomnia.      MEDICAL HISTORY:  Past Medical History:  Diagnosis Date  . Alcoholism (HCC)   . Cholecystitis   . GERD  (gastroesophageal reflux disease)   . Hypertension   . Pancreatitis 10/2015   2013 alcohol and/or gallstone  . Photosensitivity    (No formal dx of lupus) prev eval Dr. Gavin Potters in [place]  . Portal vein thrombosis 2019    SURGICAL HISTORY: Past Surgical History:  Procedure Laterality Date  . APPENDECTOMY     As a child  . CAT scan  11/00   wnl  . CHOLECYSTECTOMY  04/20/2012   Procedure: LAPAROSCOPIC CHOLECYSTECTOMY WITH INTRAOPERATIVE CHOLANGIOGRAM;  Surgeon: Robyne Askew, MD;  Location: [facility] OR;  Service: General;  Laterality: N/A;  . COLONOSCOPY  2005  . DOBUTAMINE STRESS ECHO  11/00   wnl  . DOPPLER ECHOCARDIOGRAPHY     Secondary to Redux, wnl  . ESOPHAGOGASTRODUODENOSCOPY  8/01   Esophagitis [name])  . FOOT SURGERY  04/2015   Pin in small toe  . 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 NSVD     X 2  . SBFT  08/01   wnl  . TONSILLECTOMY     As a child  . TUBAL LIGATION  Late 20's    SOCIAL HISTORY: Social History   Socioeconomic History  . Marital status: Divorced    Spouse name: Not on file  . Number of children: 2  . Years of education: 36  . Highest education level: Not on file  Occupational History  . Occupation: Armed forces operational officer    Comment: Furniture conservator/restorer, LMT  Social Needs  . Financial resource strain: Not on file  . Food insecurity:    Worry: Not on file    Inability: Not on file  . Transportation needs:    Medical: Not on file    Non-medical: Not  on file  Tobacco Use  . Smoking status: Never Smoker  . Smokeless tobacco: Never Used  Substance and Sexual Activity  . Alcohol use: Yes    Alcohol/week: 3.0 standard drinks    Types: 2 Glasses of wine, 1 Cans of beer per week    Comment: cvouple glsses of wine over weekdend and beer monday   . Drug use: No  . Sexual activity: Not on file  Lifestyle  . Physical activity:    Days per week: Not on file    Minutes per session: Not on file  . Stress: Not on file  Relationships  .  Social connections:    Talks on phone: Not on file    Gets together: Not on file    Attends religious service: Not on file    Active member of club or organization: Not on file    Attends meetings of clubs or organizations: Not on file    Relationship status: Not on file  . Intimate partner violence:    Fear of current or ex partner: Not on file    Emotionally abused: Not on file    Physically abused: Not on file    Forced sexual activity: Not on file  Other Topics Concern  . Not on file  Social History Narrative   Divorced, 2 grown children, lives with companion   Dental Hygienist   Caffeine use- coffee in morning    FAMILY HISTORY:  sister with the kidney cancer at 75; died at 49].  Father alive-  of kidney cancer at age of 3.  Mother had breast cancer at age 49.  Dad's brother had several cancers; 1 of dad's younger brother had colon cancer.  Family History  Problem Relation Age of Onset  . Heart disease Mother        CAD, angioplasty twice with stents  . Cancer Mother 62       Breast  . Breast cancer Mother 68  . Hypertension Mother   . Peripheral Artery Disease Mother   . Hypertension Father   . Hypothyroidism Father   . Heart disease Father        MI at age 94  . Cancer Father 82       H/O renal CA  . Diabetes Sister   . Cancer Sister 36       Renal CA  . Factor V Leiden deficiency Brother   . Clotting disorder Brother   . Colon cancer Neg Hx     ALLERGIES:  is allergic to latex; atorvastatin; dilaudid [hydromorphone hcl]; lexapro [escitalopram oxalate]; lisinopril; mirtazapine; nsaids; and tylenol [acetaminophen].  MEDICATIONS:  Current Outpatient Medications  Medication Sig Dispense Refill  . albuterol (PROVENTIL HFA;VENTOLIN HFA) 108 (90 Base) MCG/ACT inhaler Inhale 2 puffs into the lungs every 4 (four) hours as needed for wheezing or shortness of breath. 1 Inhaler 0  . cyanocobalamin (,VITAMIN B-12,) 1000 MCG/ML injection Inject 1,000 mcg into the muscle  every 14 (fourteen) days.    Marland Kitchen gabapentin (NEURONTIN) 300 MG capsule TAKE 2 CAPSULES BY MOUTH TWO TIMES A DAY 120 capsule 5  . hydrOXYzine (ATARAX/VISTARIL) 10 MG tablet Take 1 tablet (10 mg total) by mouth 3 (three) times daily as needed for anxiety. 30 tablet 0  . meloxicam (MOBIC) 15 MG tablet Take 1 tablet (15 mg total) by mouth daily. 60 tablet 1  . metoprolol tartrate (LOPRESSOR) 50 MG tablet TAKE 1 TABLET (50 MG TOTAL) BY MOUTH 2 (TWO) TIMES DAILY. 180 tablet  1  . ondansetron (ZOFRAN) 4 MG tablet TAKE 1 TO 2 TABLETS BY MOUTH EVERY 8 HOURS AS NEEDED FOR NAUSEA OR VOMITING 30 tablet 1  . pantoprazole (PROTONIX) 40 MG tablet Take 1 tablet (40 mg total) by mouth daily. 90 tablet 3  . promethazine (PHENERGAN) 12.5 MG tablet TAKE ONE TABLET BY MOUTH EVERY 6 HOURS AS NEEDED FOR NAUSEA/VOMITING 30 tablet 0  . traMADol (ULTRAM) 50 MG tablet Take 1 tablet every 8 hours as needed for pain. 30 tablet 0  . venlafaxine (EFFEXOR) 37.5 MG tablet Take 1-2 tablets (37.5-75 mg total) by mouth 2 (two) times daily.     No current facility-administered medications for this visit.       Marland Kitchen.  PHYSICAL EXAMINATION: ECOG PERFORMANCE STATUS: 0 - Asymptomatic  Vitals:   03/18/18 1422  BP: 94/68  Pulse: 60  Temp: 98 F (36.7 C)   Filed Weights   03/18/18 1422  Weight: 181 lb 12.8 oz (82.5 kg)    Physical Exam  Constitutional: She is oriented to person, place, and time and well-developed, well-nourished, and in no distress.  HENT:  Head: Normocephalic and atraumatic.  Mouth/Throat: Oropharynx is clear and moist. No oropharyngeal exudate.  Eyes: Pupils are equal, round, and reactive to light.  Neck: Normal range of motion. Neck supple.  Cardiovascular: Normal rate and regular rhythm.  Pulmonary/Chest: No respiratory distress. She has no wheezes.  Abdominal: Soft. Bowel sounds are normal. She exhibits no distension and no mass. There is no abdominal tenderness. There is no rebound and no guarding.   Musculoskeletal: Normal range of motion.        General: No tenderness or edema.  Neurological: She is alert and oriented to person, place, and time.  Skin: Skin is warm.  Psychiatric: Affect normal.     LABORATORY DATA:  I have reviewed the data as listed Lab Results  Component Value Date   WBC 6.7 03/18/2018   HGB 13.4 03/18/2018   HCT 40.4 03/18/2018   MCV 96.9 03/18/2018   PLT 358 03/18/2018   Recent Labs    05/14/17 0254  09/14/17 1243 09/14/17 1504 11/19/17 1400 03/18/18 1514  NA 139   < > 134*  --  138 141  K 3.5   < > 3.5  --  4.4 4.1  CL 107   < > 97*  --  106 108  CO2 24   < > 25  --  22 24  GLUCOSE 82   < > 135*  --  91 76  BUN 9   < > 8  --  7 <5*  CREATININE 0.60   < > 0.69  --  0.51 0.60  CALCIUM 7.8*   < > 9.0  --  8.7* 8.2*  GFRNONAA >60   < > >60  --  >60 >60  GFRAA >60   < > >60  --  >60 >60  PROT 6.3*  --   --  7.7 6.8  --   ALBUMIN 2.9*  --   --  3.7 3.4*  --   AST 98*  --   --  56* 56*  --   ALT 50  --   --  28 32  --   ALKPHOS 42  --   --  82 90  --   BILITOT 0.7  --   --  1.2 0.8  --   BILIDIR  --   --   --  0.3  --   --  IBILI  --   --   --  0.9  --   --    < > = values in this interval not displayed.    RADIOGRAPHIC STUDIES: I have personally reviewed the radiological images as listed and agreed with the findings in the report. No results found.  ASSESSMENT & PLAN:   Portal vein thrombosis #  HETEROZYGOUS factor V Leiden; nonocclusive portal vein thrombus at the splenic vein/portal vein confluence.  Provoked-sepsis/UTI.  Stable.  #Status post 6 months of anticoagulation with Eliquis; stopped in mid September 2019.  Await labs from today including d-dimer.   #Family history of malignancies-will refer to genetic counseling.will discuss with pt.   # DISPOSITION: # labs- today- cbc/bmp/d-dimer; # follow up TBD- Dr.B  Addendum: Patient d-dimer 1300/elevated CBC BMP normal.  We will plan to repeat d-dimer in approximately 1 month.   And if still elevated consider imaging for portal vein thrombosis. Will also discuss re: genetic testing; and plan follow up. Will inform patient of the plan.   cc; Dr.Duncan.    All questions were answered. The patient knows to call the clinic with any problems, questions or concerns.    Earna Coder, MD 03/20/2018 7:23 PM

## 2018-03-20 ENCOUNTER — Telehealth: Payer: Self-pay | Admitting: Internal Medicine

## 2018-03-20 DIAGNOSIS — I81 Portal vein thrombosis: Secondary | ICD-10-CM

## 2018-03-20 NOTE — Telephone Encounter (Signed)
FYI- Patient d-dimer 1300/elevated CBC BMP normal.  We will plan to repeat d-dimer in approximately 1 month.  And if still elevated consider imaging for portal vein thrombosis. Will also discuss re: genetic testing; and plan follow up.   I will inform patient of the plan.

## 2018-03-21 NOTE — Addendum Note (Signed)
Addended by: Gala MurdochJONES, Indira Sorenson R on: 03/21/2018 03:55 PM   Modules accepted: Orders

## 2018-03-21 NOTE — Telephone Encounter (Signed)
Abigail [name], please schedule patient for lab only in 1 month

## 2018-03-24 ENCOUNTER — Ambulatory Visit: Payer: BLUE CROSS/BLUE SHIELD

## 2018-03-25 ENCOUNTER — Encounter: Payer: Self-pay | Admitting: Family Medicine

## 2018-03-27 ENCOUNTER — Other Ambulatory Visit: Payer: Self-pay | Admitting: Family Medicine

## 2018-03-27 MED ORDER — VENLAFAXINE HCL 75 MG PO TABS: 75.0000 mg | ORAL_TABLET | Freq: Two times a day (BID) | ORAL | 3 refills | Status: DC

## 2018-03-28 ENCOUNTER — Telehealth: Payer: Self-pay | Admitting: Internal Medicine

## 2018-03-28 DIAGNOSIS — Z809 Family history of malignant neoplasm, unspecified: Secondary | ICD-10-CM

## 2018-03-28 NOTE — Telephone Encounter (Signed)
Called pt re: elevated d-dimer-we will plan to repeat d-dimer in approximately 1 month-based upon the labs would then consider reimaging/restarting anticoagulation.  Also discussed regarding genetic counseling given her secondary family history patient agreeable for genetic counseling.    Please make a referral for genetic counseling next available-family history of multiple malignancies  Dr.B

## 2018-03-29 NOTE — Addendum Note (Signed)
Addended by: Janelle FloorHOMPSON, MONICA B on: 03/29/2018 08:46 AM   Modules accepted: Orders

## 2018-03-29 NOTE — Telephone Encounter (Signed)
Referral has been placed. 

## 2018-04-08 ENCOUNTER — Other Ambulatory Visit: Payer: Self-pay | Admitting: Family Medicine

## 2018-04-08 NOTE — Telephone Encounter (Addendum)
Electronic refill request. Tramadol Last office visit:   03/15/18 Last Filled:    30 tablet 0 03/08/2018  Please advise.

## 2018-04-10 NOTE — Telephone Encounter (Signed)
Sent. Thanks.   

## 2018-04-12 ENCOUNTER — Ambulatory Visit (INDEPENDENT_AMBULATORY_CARE_PROVIDER_SITE_OTHER): Payer: BLUE CROSS/BLUE SHIELD | Admitting: *Deleted

## 2018-04-12 DIAGNOSIS — E538 Deficiency of other specified B group vitamins: Secondary | ICD-10-CM | POA: Diagnosis not present

## 2018-04-12 MED ORDER — CYANOCOBALAMIN 1000 MCG/ML IJ SOLN
1000.0000 ug | Freq: Once | INTRAMUSCULAR | Status: AC
  Administered 2018-04-12: 1000 ug via INTRAMUSCULAR

## 2018-04-12 NOTE — Progress Notes (Signed)
Per orders of Dr. Tower, injection of b12 given by Lashaundra Lehrmann H. Patient tolerated injection well.  

## 2018-04-18 ENCOUNTER — Inpatient Hospital Stay: Payer: BLUE CROSS/BLUE SHIELD | Attending: Internal Medicine

## 2018-04-18 DIAGNOSIS — D6851 Activated protein C resistance: Secondary | ICD-10-CM | POA: Diagnosis not present

## 2018-04-18 DIAGNOSIS — I81 Portal vein thrombosis: Secondary | ICD-10-CM | POA: Diagnosis not present

## 2018-04-18 LAB — CBC WITH DIFFERENTIAL/PLATELET
Abs Immature Granulocytes: 0 10*3/uL (ref 0.00–0.07)
Basophils Absolute: 0.1 10*3/uL (ref 0.0–0.1)
Basophils Relative: 1 %
Eosinophils Absolute: 0 10*3/uL (ref 0.0–0.5)
Eosinophils Relative: 0 %
HCT: 40.4 % (ref 36.0–46.0)
Hemoglobin: 13.2 g/dL (ref 12.0–15.0)
IMMATURE GRANULOCYTES: 0 %
Lymphocytes Relative: 32 %
Lymphs Abs: 1.2 10*3/uL (ref 0.7–4.0)
MCH: 32.2 pg (ref 26.0–34.0)
MCHC: 32.7 g/dL (ref 30.0–36.0)
MCV: 98.5 fL (ref 80.0–100.0)
Monocytes Absolute: 0.5 10*3/uL (ref 0.1–1.0)
Monocytes Relative: 15 %
NEUTROS PCT: 52 %
Neutro Abs: 1.9 10*3/uL (ref 1.7–7.7)
Platelets: 270 10*3/uL (ref 150–400)
RBC: 4.1 MIL/uL (ref 3.87–5.11)
RDW: 13.2 % (ref 11.5–15.5)
WBC: 3.7 10*3/uL — ABNORMAL LOW (ref 4.0–10.5)
nRBC: 0 % (ref 0.0–0.2)

## 2018-04-18 LAB — BASIC METABOLIC PANEL
ANION GAP: 4 — AB (ref 5–15)
BUN: <5 mg/dL — ABNORMAL LOW (ref 6–20)
CALCIUM: 8.1 mg/dL — AB (ref 8.9–10.3)
CO2: 29 mmol/L (ref 22–32)
Chloride: 109 mmol/L (ref 98–111)
Creatinine, Ser: 0.45 mg/dL (ref 0.44–1.00)
GFR calc Af Amer: >60 mL/min (ref >60)
GFR calc non Af Amer: >60 mL/min (ref >60)
Glucose, Bld: 83 mg/dL (ref 70–99)
Potassium: 3.8 mmol/L (ref 3.5–5.1)
Sodium: 142 mmol/L (ref 135–145)

## 2018-04-18 LAB — FIBRIN DERIVATIVES D-DIMER (ARMC ONLY): Fibrin derivatives D-dimer (ARMC): 1688.4 ng/mL (FEU) — ABNORMAL HIGH (ref 0.00–499.00)

## 2018-04-23 IMAGING — CT CT HEAD W/O CM
3 series · 16 of 44 positions shown, 19 images · non-contrast
Comparison: None.

CLINICAL DATA: Left occipital headache with nausea blurred vision
for 1 week. Left base tingling yesterday.

EXAM:
CT HEAD WITHOUT CONTRAST
TECHNIQUE: Contiguous axial images were obtained from the base of the skull
through the vertex without intravenous contrast.

[Series 2: head 5.0 h37s · axial · 0.41mm/px · z∈[+144,+254]mm · 10 of 27 slices shown, 13 images]
[im 3/27  brain]
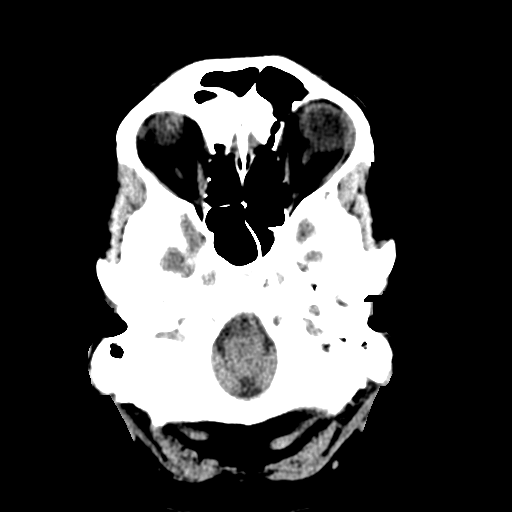
[im 3/27  bone]
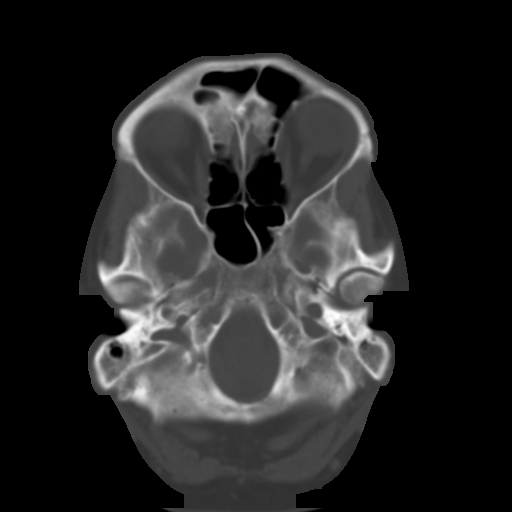
[im 5/27  brain]
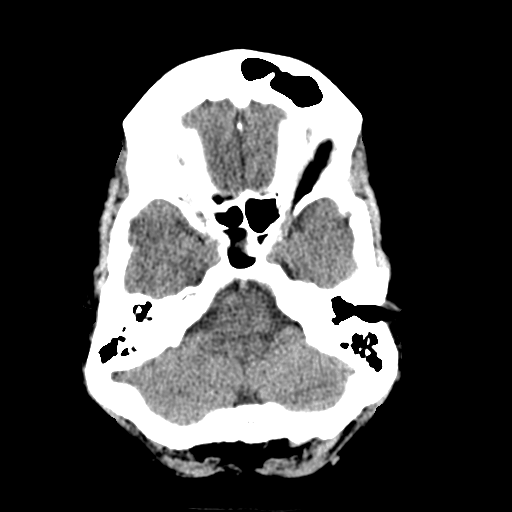
[im 8/27  brain]
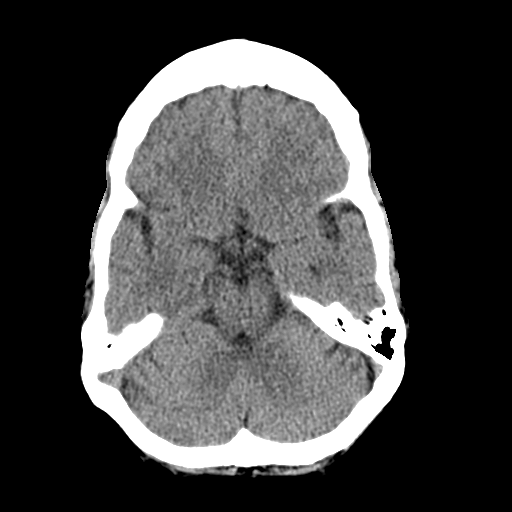
[im 10/27  brain]
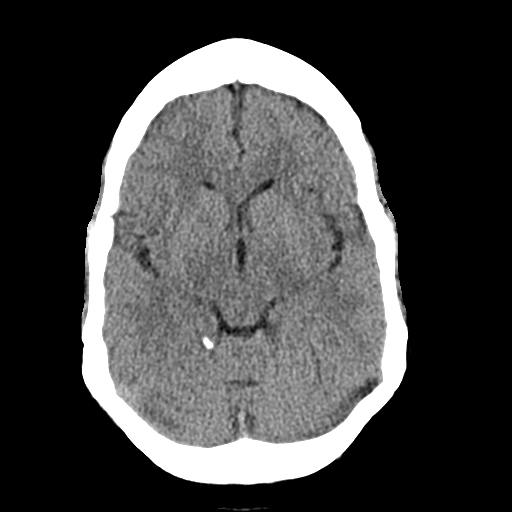
[im 13/27  brain]
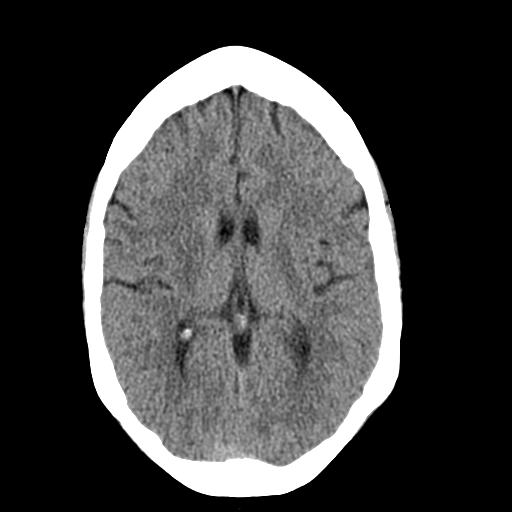
[im 13/27  bone]
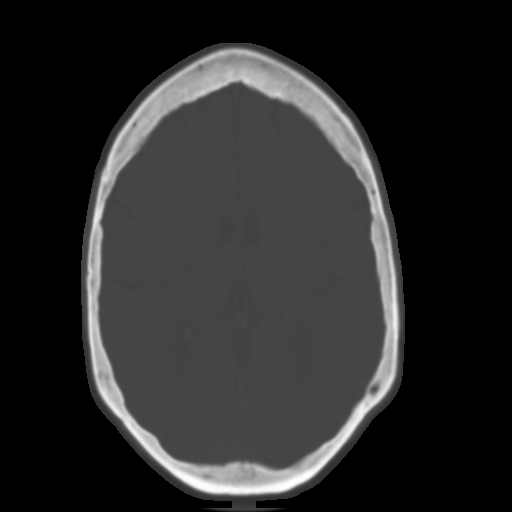
[im 15/27  brain]
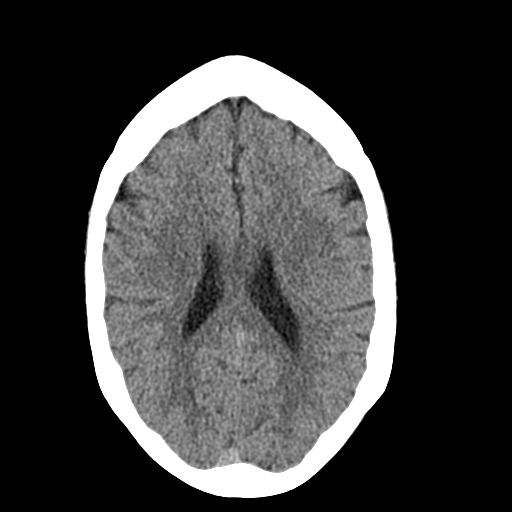
[im 18/27  brain]
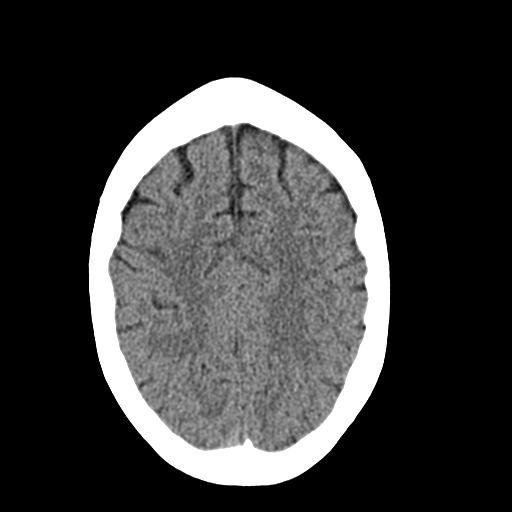
[im 20/27  brain]
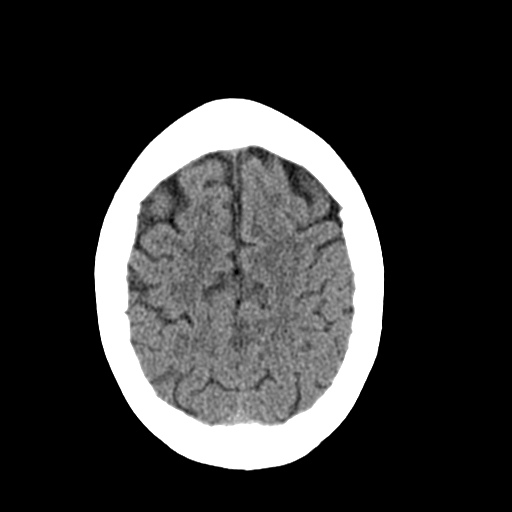
[im 23/27  brain]
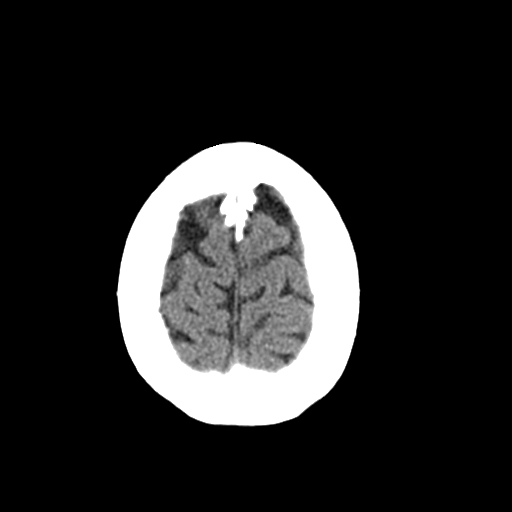
[im 23/27  bone]
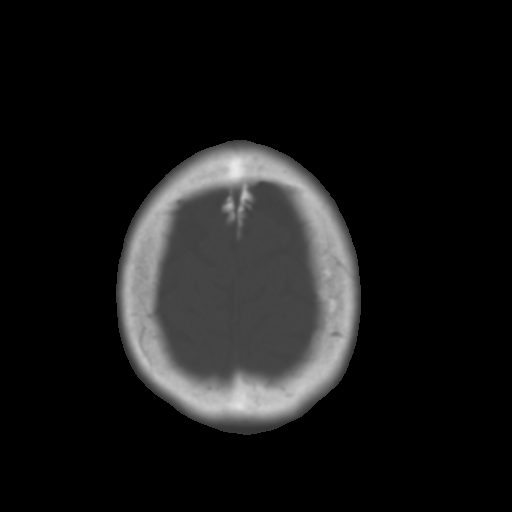
[im 25/27  brain]
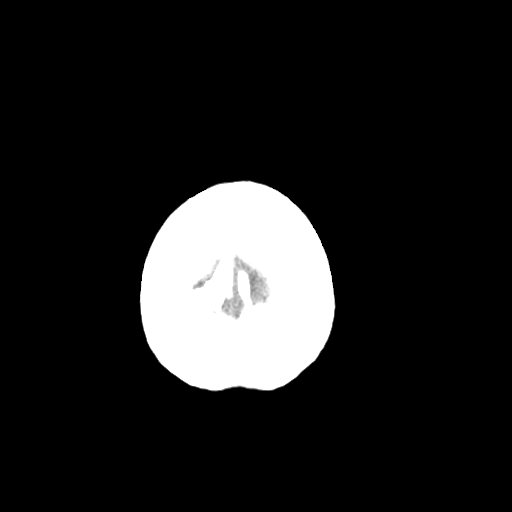

[Series 4: head 3.0 mpr cor · coronal · 0.27mm/px · 3 of 65 slices shown]
[im 22/65  brain]
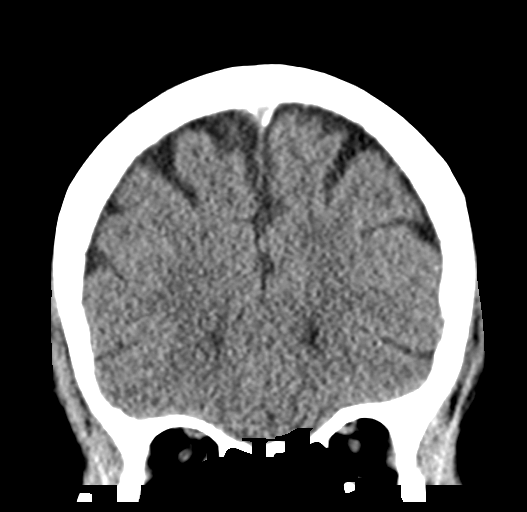
[im 29/65  brain]
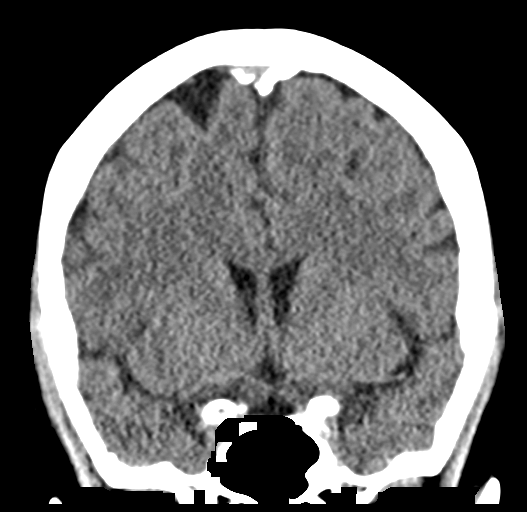
[im 36/65  brain]
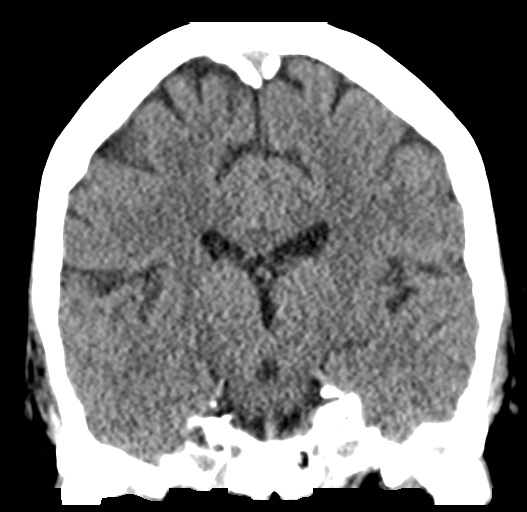

[Series 5: head 3.0 mpr sag · sagittal · 0.28mm/px · 3 of 46 slices shown]
[im 16/46  brain]
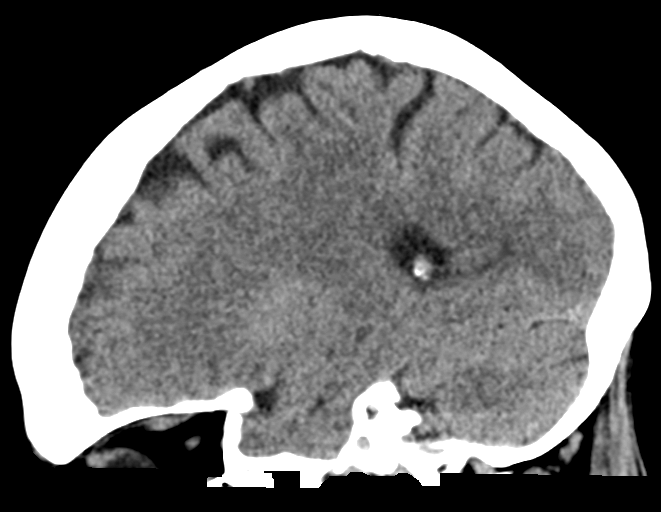
[im 23/46  brain]
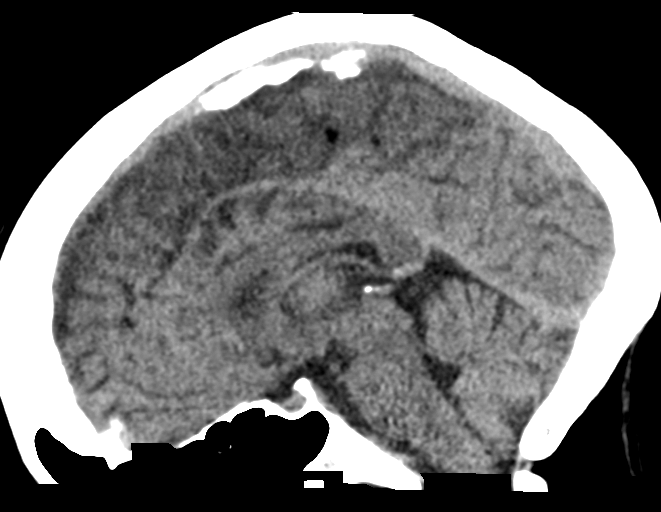
[im 31/46  brain]
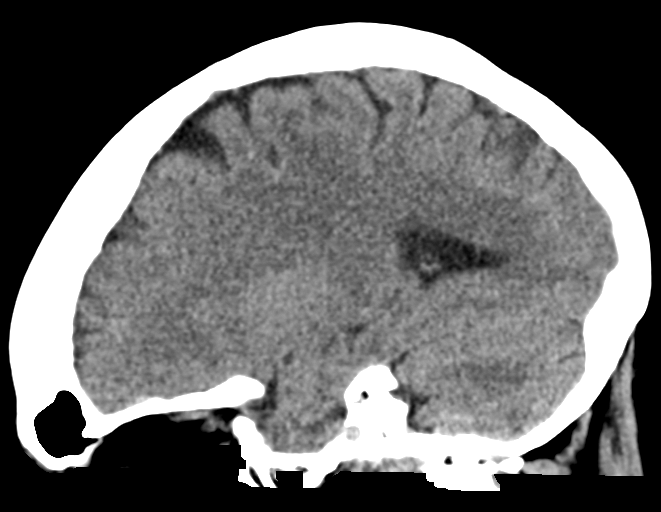

[16 of 44 positions shown; findings below may reference images not displayed]

FINDINGS: The ventricles are normal in size and configuration.

There are no parenchymal masses or mass effect, no evidence of an
infarct, no extra-axial masses or abnormal fluid collections and no
intracranial hemorrhage.

The visualized sinuses and mastoid air cells are clear. No skull
lesion.
IMPRESSION: Normal unenhanced CT scan of the brain.

## 2018-04-27 ENCOUNTER — Ambulatory Visit (INDEPENDENT_AMBULATORY_CARE_PROVIDER_SITE_OTHER): Payer: BLUE CROSS/BLUE SHIELD

## 2018-04-27 DIAGNOSIS — E538 Deficiency of other specified B group vitamins: Secondary | ICD-10-CM

## 2018-04-27 MED ORDER — CYANOCOBALAMIN 1000 MCG/ML IJ SOLN
1000.0000 ug | Freq: Once | INTRAMUSCULAR | Status: AC
  Administered 2018-04-27: 1000 ug via INTRAMUSCULAR

## 2018-04-27 NOTE — Progress Notes (Signed)
Sent to Dr Reece Agar in Dr Lianne Bushy absence. Pt given 1000mg /31ml B12 in Left Deltoid. Tolerated well. She is due for labs before next shot. She said he cancer Dr did not do a B12 last week.

## 2018-04-28 ENCOUNTER — Encounter: Payer: Self-pay | Admitting: Family Medicine

## 2018-04-28 ENCOUNTER — Ambulatory Visit: Payer: BLUE CROSS/BLUE SHIELD | Admitting: Family Medicine

## 2018-04-28 VITALS — BP 128/74 | HR 64 | Temp 98.9°F | Ht 60.0 in | Wt 172.8 lb

## 2018-04-28 DIAGNOSIS — H6011 Cellulitis of right external ear: Secondary | ICD-10-CM

## 2018-04-28 DIAGNOSIS — F419 Anxiety disorder, unspecified: Secondary | ICD-10-CM | POA: Diagnosis not present

## 2018-04-28 DIAGNOSIS — E538 Deficiency of other specified B group vitamins: Secondary | ICD-10-CM

## 2018-04-28 MED ORDER — DOXYCYCLINE HYCLATE 100 MG PO TABS: 100.0000 mg | ORAL_TABLET | Freq: Two times a day (BID) | ORAL | 0 refills | Status: DC

## 2018-04-28 NOTE — Patient Instructions (Addendum)
Get a B12 level just before your dose on 05/11/2018.    This looks like a small irritated/infected sore without an abscess.  Start doxycycline and update me if not better.   Take care.  Glad to see you.  For now, try taking 2 tramadol at a time for the pain, twice a day.

## 2018-04-28 NOTE — Progress Notes (Signed)
B12 deficiency.  She felt better on q14day B12 IM and she can tell a change if she has a delay in dosing.    Mood is good.  Still taking baseline medication.  Effexor 75mg  BID helped.  No ADE on med.    3 weeks ago she felt something in the cartilage on her R ear.  She scratched the area.  It has not healed in the meantime.  She has had some HA on the R side. No L sided HA or ear sx.    No FCNAVD.  No trauma.  Hearing is not affected.    Meds, vitals, and allergies reviewed.   ROS: Per HPI unless specifically indicated in ROS section   nad ncat Bilateral tympanic membranes and ear canals within normal limits.  Left pinna normal.  Right pinna is normal except for a small irritated area along the superior portion of the antihelix.  It looks like she has a very small denuded area surrounded by a small amount of erythema without any fluctuant mass or purulent discharge.  The area transilluminates normally.  Right mastoid is not tender but she does have some tenderness along the posterior auricular chain on the right side only.  No preauricular lymphadenopathy. Neck supple.  No lymphadenopathy.

## 2018-05-01 DIAGNOSIS — H6011 Cellulitis of right external ear: Secondary | ICD-10-CM | POA: Insufficient documentation

## 2018-05-01 NOTE — Assessment & Plan Note (Signed)
Mood is good on current medication.  Continue as is.  She agrees.

## 2018-05-01 NOTE — Assessment & Plan Note (Addendum)
There is no fluctuant mass that would need incision and drainage.  Discussed with patient about options.  She been using topical Neosporin in the meantime without relief.  Would start doxycycline with routine cautions.  I think the tenderness she has is likely reactive lymphadenopathy.  At this point still okay for outpatient follow-up.  If she is not improving then she will let me know.  Reasonable to try tramadol as needed for pain.  See after visit summary.  She agrees.

## 2018-05-01 NOTE — Assessment & Plan Note (Signed)
She felt better with every 14-day B12 replacement.  Continue as is.  She agrees.

## 2018-05-05 ENCOUNTER — Telehealth: Payer: Self-pay | Admitting: Internal Medicine

## 2018-05-05 NOTE — Telephone Encounter (Signed)
x

## 2018-05-11 ENCOUNTER — Ambulatory Visit: Payer: BLUE CROSS/BLUE SHIELD

## 2018-05-12 ENCOUNTER — Other Ambulatory Visit (INDEPENDENT_AMBULATORY_CARE_PROVIDER_SITE_OTHER): Payer: BLUE CROSS/BLUE SHIELD

## 2018-05-12 ENCOUNTER — Ambulatory Visit (INDEPENDENT_AMBULATORY_CARE_PROVIDER_SITE_OTHER): Payer: BLUE CROSS/BLUE SHIELD

## 2018-05-12 DIAGNOSIS — E538 Deficiency of other specified B group vitamins: Secondary | ICD-10-CM

## 2018-05-12 MED ORDER — CYANOCOBALAMIN 1000 MCG/ML IJ SOLN
1000.0000 ug | Freq: Once | INTRAMUSCULAR | Status: AC
  Administered 2018-05-12: 1000 ug via INTRAMUSCULAR

## 2018-05-12 NOTE — Progress Notes (Signed)
Per orders of Dr. Duncan, injection of vit B12 given by Onesty Clair. Patient tolerated injection well.  

## 2018-05-13 LAB — VITAMIN B12: Vitamin B-12: 1046 pg/mL — ABNORMAL HIGH (ref 211–911)

## 2018-05-23 ENCOUNTER — Other Ambulatory Visit: Payer: Self-pay | Admitting: Family Medicine

## 2018-05-23 NOTE — Telephone Encounter (Signed)
Electronic refill request. Promethazine Last office visit:   04/28/2018 Last Filled:    30 tablet 0 03/08/2018  Please advise.

## 2018-05-23 NOTE — Telephone Encounter (Signed)
Sent. Thanks.   

## 2018-05-25 ENCOUNTER — Ambulatory Visit: Payer: BLUE CROSS/BLUE SHIELD

## 2018-05-26 ENCOUNTER — Ambulatory Visit (INDEPENDENT_AMBULATORY_CARE_PROVIDER_SITE_OTHER): Payer: BLUE CROSS/BLUE SHIELD | Admitting: *Deleted

## 2018-05-26 DIAGNOSIS — E538 Deficiency of other specified B group vitamins: Secondary | ICD-10-CM

## 2018-05-26 MED ORDER — CYANOCOBALAMIN 1000 MCG/ML IJ SOLN
1000.0000 ug | Freq: Once | INTRAMUSCULAR | Status: AC
  Administered 2018-05-26: 1000 ug via INTRAMUSCULAR

## 2018-05-26 NOTE — Progress Notes (Signed)
Per orders of Dr. Duncan, injection of B12 given by Matsuko Kretz M. Patient tolerated injection well. 

## 2018-06-08 ENCOUNTER — Ambulatory Visit: Payer: BLUE CROSS/BLUE SHIELD

## 2018-06-09 ENCOUNTER — Ambulatory Visit: Payer: BLUE CROSS/BLUE SHIELD

## 2018-06-13 ENCOUNTER — Other Ambulatory Visit: Payer: Self-pay | Admitting: Family Medicine

## 2018-06-14 ENCOUNTER — Ambulatory Visit (INDEPENDENT_AMBULATORY_CARE_PROVIDER_SITE_OTHER): Payer: BLUE CROSS/BLUE SHIELD

## 2018-06-14 DIAGNOSIS — E538 Deficiency of other specified B group vitamins: Secondary | ICD-10-CM | POA: Diagnosis not present

## 2018-06-14 MED ORDER — CYANOCOBALAMIN 1000 MCG/ML IJ SOLN
1000.0000 ug | Freq: Once | INTRAMUSCULAR | Status: AC
  Administered 2018-06-14: 1000 ug via INTRAMUSCULAR

## 2018-06-14 NOTE — Progress Notes (Signed)
Pt received every 2 weeks 1075mcg/ml B12 in Right Deltoid. Tolerated well.

## 2018-06-20 ENCOUNTER — Other Ambulatory Visit: Payer: Self-pay

## 2018-06-20 DIAGNOSIS — J4520 Mild intermittent asthma, uncomplicated: Secondary | ICD-10-CM

## 2018-06-20 MED ORDER — ALBUTEROL SULFATE HFA 108 (90 BASE) MCG/ACT IN AERS: 2.0000 | INHALATION_SPRAY | RESPIRATORY_TRACT | 0 refills | Status: AC | PRN

## 2018-06-21 ENCOUNTER — Other Ambulatory Visit: Payer: Self-pay | Admitting: Family Medicine

## 2018-06-21 NOTE — Telephone Encounter (Signed)
Electronic refill request. Tramadol Last office visit:   04/28/2018 Last Filled:    30 tablet 1 04/10/2018  Please advise.

## 2018-06-22 NOTE — Telephone Encounter (Signed)
Sent. Thanks.   

## 2018-06-23 ENCOUNTER — Ambulatory Visit: Payer: BLUE CROSS/BLUE SHIELD

## 2018-06-24 ENCOUNTER — Telehealth: Payer: Self-pay | Admitting: Licensed Clinical Social Worker

## 2018-06-28 ENCOUNTER — Ambulatory Visit (INDEPENDENT_AMBULATORY_CARE_PROVIDER_SITE_OTHER): Payer: BLUE CROSS/BLUE SHIELD | Admitting: *Deleted

## 2018-06-28 ENCOUNTER — Other Ambulatory Visit: Payer: Self-pay

## 2018-06-28 DIAGNOSIS — E538 Deficiency of other specified B group vitamins: Secondary | ICD-10-CM

## 2018-06-28 MED ORDER — CYANOCOBALAMIN 1000 MCG/ML IJ SOLN
1000.0000 ug | Freq: Once | INTRAMUSCULAR | Status: AC
  Administered 2018-06-28: 1000 ug via INTRAMUSCULAR

## 2018-06-28 NOTE — Progress Notes (Signed)
Per orders of Dr. Duncan, injection of B12 given by [place], Shapale M. Patient tolerated injection well. 

## 2018-06-28 NOTE — Telephone Encounter (Signed)
LM that we are cancelling her 3/26 appointment at [facility] due to non-urgent, gave her my number to call back and r/s

## 2018-06-30 ENCOUNTER — Inpatient Hospital Stay: Payer: BLUE CROSS/BLUE SHIELD | Admitting: Licensed Clinical Social Worker

## 2018-07-05 ENCOUNTER — Other Ambulatory Visit: Payer: Self-pay | Admitting: Family Medicine

## 2018-07-07 ENCOUNTER — Ambulatory Visit: Payer: BLUE CROSS/BLUE SHIELD

## 2018-07-08 ENCOUNTER — Ambulatory Visit: Payer: BLUE CROSS/BLUE SHIELD | Admitting: Family Medicine

## 2018-07-08 ENCOUNTER — Ambulatory Visit (INDEPENDENT_AMBULATORY_CARE_PROVIDER_SITE_OTHER)
Admission: RE | Admit: 2018-07-08 | Discharge: 2018-07-08 | Disposition: A | Discharge status: Home / Self Care | Payer: BLUE CROSS/BLUE SHIELD | Source: Ambulatory Visit | Attending: Family Medicine | Admitting: Family Medicine

## 2018-07-08 ENCOUNTER — Encounter: Payer: Self-pay | Admitting: Family Medicine

## 2018-07-08 ENCOUNTER — Other Ambulatory Visit: Payer: Self-pay

## 2018-07-08 VITALS — BP 110/70 | HR 61 | Temp 98.0°F | Wt 160.3 lb

## 2018-07-08 DIAGNOSIS — M25561 Pain in right knee: Secondary | ICD-10-CM

## 2018-07-08 MED ORDER — TRAMADOL HCL 50 MG PO TABS: 50.0000 mg | ORAL_TABLET | Freq: Three times a day (TID) | ORAL | 0 refills | Status: DC | PRN

## 2018-07-08 MED ORDER — LIDOCAINE VISCOUS HCL 2 % MT SOLN: OROMUCOSAL | 0 refills | Status: DC

## 2018-07-08 NOTE — Progress Notes (Signed)
She slipped at home on 07/02/2018 on a wet floor.  Denies assault.  She is safe at home.  No LOC.  Hit floor, didn't hit head.  She caught the counter with her R arm on the way down.  Hit weight on R knee.  More pain in the meantime, R knee tender with light touch, burning pain. Can bear weight but with pain.  Also with pain near R greater troch area.    Meds, vitals, and allergies reviewed.   ROS: Per HPI unless specifically indicated in ROS section   nad ncat rrr Able to bear weight.  No pain on internal rotation right hip. Right greater trochanteric area tender to palpation. Normal hip range of motion otherwise. R knee with medial bruising noted. Medial R patellar area ttp with light touch.  Pain with knee flexion >90 deg.  Not tender to palpation otherwise. Knee is slightly puffy. Distally grossly neurovascular intact.

## 2018-07-08 NOTE — Patient Instructions (Addendum)
Hip area pain is likely bursitis.  Use ice for 5 minutes at a time for pain.   Knee pain may improve with icing too.   Use lidocaine on a bandage and apply that up to 4 times a day.  Update me as needed.  Take care.  Glad to see you.

## 2018-07-10 NOTE — Assessment & Plan Note (Signed)
Hip area pain is likely bursitis, discussed with patient.  Use ice for 5 minutes at a time for pain.  Update me as needed.  Knee pain may improve with icing too.   No fracture seen on x-ray.  I question if she concussed a peripheral nerve given that she has significant tenderness to light touch.  She can use lidocaine on a bandage and apply that up to 4 times a day, update me as needed.  She agrees.

## 2018-07-12 ENCOUNTER — Other Ambulatory Visit: Payer: Self-pay

## 2018-07-12 ENCOUNTER — Ambulatory Visit (INDEPENDENT_AMBULATORY_CARE_PROVIDER_SITE_OTHER): Payer: BLUE CROSS/BLUE SHIELD

## 2018-07-12 DIAGNOSIS — E538 Deficiency of other specified B group vitamins: Secondary | ICD-10-CM

## 2018-07-12 MED ORDER — CYANOCOBALAMIN 1000 MCG/ML IJ SOLN
1000.0000 ug | Freq: Once | INTRAMUSCULAR | Status: AC
  Administered 2018-07-12: 1000 ug via INTRAMUSCULAR

## 2018-07-12 NOTE — Progress Notes (Signed)
Pt given B12 injection in Left Deltoid. Pt tolerated well.

## 2018-07-15 ENCOUNTER — Telehealth: Payer: Self-pay | Admitting: Internal Medicine

## 2018-07-15 NOTE — Telephone Encounter (Signed)
Sent to my chart message.  Hi Ms. Theberge: I wanted to reach out to you regarding the results of your d-dimer testing which are elevated.  However this test results are very nonspecific; and I would not recommend putting you back on blood thinners at this time.  However if you do get a repeat blood clot-then lifelong blood thinners would be recommended.  Please continue follow-up with PCP/and follow-up with me as needed.  FYI-Dr.Duncan.

## 2018-07-17 NOTE — Telephone Encounter (Signed)
Noted. Thanks.  I appreciate hematology input.

## 2018-07-25 ENCOUNTER — Telehealth: Payer: Self-pay | Admitting: Radiology

## 2018-07-25 NOTE — Telephone Encounter (Signed)
LVM to give patient drive up info for nurse visit . Also, screen for covid.

## 2018-07-26 ENCOUNTER — Other Ambulatory Visit: Payer: Self-pay

## 2018-07-26 ENCOUNTER — Ambulatory Visit (INDEPENDENT_AMBULATORY_CARE_PROVIDER_SITE_OTHER): Payer: BLUE CROSS/BLUE SHIELD

## 2018-07-26 DIAGNOSIS — E538 Deficiency of other specified B group vitamins: Secondary | ICD-10-CM | POA: Diagnosis not present

## 2018-07-26 MED ORDER — CYANOCOBALAMIN 1000 MCG/ML IJ SOLN
1000.0000 ug | Freq: Once | INTRAMUSCULAR | Status: AC
  Administered 2018-07-26: 16:00:00 1000 ug via INTRAMUSCULAR

## 2018-07-26 NOTE — Progress Notes (Signed)
Per orders of Dr. Para March, injection of B12 given by Consuella Lose. Patient tolerated injection well.  Patient is getting injections every 2 weeks at this time.

## 2018-07-27 ENCOUNTER — Encounter: Payer: Self-pay | Admitting: Emergency Medicine

## 2018-07-27 ENCOUNTER — Emergency Department: Payer: BLUE CROSS/BLUE SHIELD

## 2018-07-27 ENCOUNTER — Emergency Department
Admission: EM | Admit: 2018-07-27 | Discharge: 2018-07-27 | Disposition: A | Discharge status: Other Acute Inpatient Hospital | Payer: BLUE CROSS/BLUE SHIELD | Attending: Emergency Medicine | Admitting: Emergency Medicine

## 2018-07-27 ENCOUNTER — Other Ambulatory Visit: Payer: Self-pay

## 2018-07-27 ENCOUNTER — Telehealth: Payer: Self-pay

## 2018-07-27 DIAGNOSIS — R42 Dizziness and giddiness: Secondary | ICD-10-CM | POA: Diagnosis not present

## 2018-07-27 DIAGNOSIS — I10 Essential (primary) hypertension: Secondary | ICD-10-CM | POA: Diagnosis not present

## 2018-07-27 DIAGNOSIS — R109 Unspecified abdominal pain: Secondary | ICD-10-CM | POA: Diagnosis present

## 2018-07-27 DIAGNOSIS — R51 Headache: Secondary | ICD-10-CM | POA: Diagnosis not present

## 2018-07-27 DIAGNOSIS — Z79899 Other long term (current) drug therapy: Secondary | ICD-10-CM | POA: Diagnosis not present

## 2018-07-27 DIAGNOSIS — R1012 Left upper quadrant pain: Secondary | ICD-10-CM | POA: Diagnosis not present

## 2018-07-27 DIAGNOSIS — J45909 Unspecified asthma, uncomplicated: Secondary | ICD-10-CM | POA: Insufficient documentation

## 2018-07-27 LAB — URINALYSIS, COMPLETE (UACMP) WITH MICROSCOPIC
Bacteria, UA: NONE SEEN
Bilirubin Urine: NEGATIVE
Glucose, UA: NEGATIVE mg/dL
Ketones, ur: NEGATIVE mg/dL
Leukocytes,Ua: NEGATIVE
Nitrite: NEGATIVE
Protein, ur: NEGATIVE mg/dL
Specific Gravity, Urine: 1.038 — ABNORMAL HIGH (ref 1.005–1.030)
WBC, UA: >50 WBC/hpf — ABNORMAL HIGH (ref 0–5)
pH: 6 (ref 5.0–8.0)

## 2018-07-27 LAB — COMPREHENSIVE METABOLIC PANEL
ALT: 12 U/L (ref 0–44)
AST: 21 U/L (ref 15–41)
Albumin: 1.9 g/dL — ABNORMAL LOW (ref 3.5–5.0)
Alkaline Phosphatase: 73 U/L (ref 38–126)
Anion gap: 9 (ref 5–15)
BUN: 8 mg/dL (ref 6–20)
CO2: 20 mmol/L — ABNORMAL LOW (ref 22–32)
Calcium: 6.8 mg/dL — ABNORMAL LOW (ref 8.9–10.3)
Chloride: 110 mmol/L (ref 98–111)
Creatinine, Ser: 0.59 mg/dL (ref 0.44–1.00)
GFR calc Af Amer: >60 mL/min (ref >60)
GFR calc non Af Amer: >60 mL/min (ref >60)
Glucose, Bld: 152 mg/dL — ABNORMAL HIGH (ref 70–99)
Potassium: 3 mmol/L — ABNORMAL LOW (ref 3.5–5.1)
Sodium: 139 mmol/L (ref 135–145)
Total Bilirubin: 0.7 mg/dL (ref 0.3–1.2)
Total Protein: 5.1 g/dL — ABNORMAL LOW (ref 6.5–8.1)

## 2018-07-27 LAB — CBC WITH DIFFERENTIAL/PLATELET
Abs Immature Granulocytes: 0.06 10*3/uL (ref 0.00–0.07)
Basophils Absolute: 0 10*3/uL (ref 0.0–0.1)
Basophils Relative: 0 %
Eosinophils Absolute: 0 10*3/uL (ref 0.0–0.5)
Eosinophils Relative: 0 %
HCT: 43 % (ref 36.0–46.0)
Hemoglobin: 14.9 g/dL (ref 12.0–15.0)
Immature Granulocytes: 0 %
Lymphocytes Relative: 9 %
Lymphs Abs: 1.6 10*3/uL (ref 0.7–4.0)
MCH: 33.2 pg (ref 26.0–34.0)
MCHC: 34.7 g/dL (ref 30.0–36.0)
MCV: 95.8 fL (ref 80.0–100.0)
Monocytes Absolute: 1.6 10*3/uL — ABNORMAL HIGH (ref 0.1–1.0)
Monocytes Relative: 9 %
Neutro Abs: 14.2 10*3/uL — ABNORMAL HIGH (ref 1.7–7.7)
Neutrophils Relative %: 82 %
Platelets: 288 10*3/uL (ref 150–400)
RBC: 4.49 MIL/uL (ref 3.87–5.11)
RDW: 13.6 % (ref 11.5–15.5)
WBC: 17.5 10*3/uL — ABNORMAL HIGH (ref 4.0–10.5)
nRBC: 0 % (ref 0.0–0.2)

## 2018-07-27 LAB — LIPASE, BLOOD: Lipase: 50 U/L (ref 11–51)

## 2018-07-27 MED ORDER — SODIUM CHLORIDE 0.9 % IV BOLUS
1000.0000 mL | Freq: Once | INTRAVENOUS | Status: AC
  Administered 2018-07-27: 1000 mL via INTRAVENOUS

## 2018-07-27 MED ORDER — ONDANSETRON HCL 4 MG/2ML IJ SOLN
4.0000 mg | Freq: Once | INTRAMUSCULAR | Status: AC
  Administered 2018-07-27: 12:00:00 4 mg via INTRAVENOUS

## 2018-07-27 MED ORDER — MORPHINE SULFATE (PF) 2 MG/ML IV SOLN
2.0000 mg | Freq: Once | INTRAVENOUS | Status: AC
  Administered 2018-07-27: 22:00:00 2 mg via INTRAVENOUS

## 2018-07-27 MED ORDER — IOHEXOL 240 MG/ML SOLN
50.0000 mL | Freq: Once | INTRAMUSCULAR | Status: AC | PRN
  Administered 2018-07-27: 13:00:00 50 mL via ORAL

## 2018-07-27 MED ORDER — SODIUM CHLORIDE 0.9 % IV SOLN
1.0000 g | Freq: Once | INTRAVENOUS | Status: AC
  Administered 2018-07-27: 1 g via INTRAVENOUS
  Filled 2018-07-27: qty 1

## 2018-07-27 MED ORDER — PROMETHAZINE HCL 25 MG/ML IJ SOLN
25.0000 mg | Freq: Once | INTRAMUSCULAR | Status: AC
  Administered 2018-07-27: 12:00:00 25 mg via INTRAVENOUS

## 2018-07-27 MED ORDER — MORPHINE SULFATE (PF) 2 MG/ML IV SOLN
INTRAVENOUS | Status: AC
  Administered 2018-07-27: 2 mg via INTRAVENOUS
  Filled 2018-07-27: qty 1

## 2018-07-27 MED ORDER — IOHEXOL 300 MG/ML  SOLN
100.0000 mL | Freq: Once | INTRAMUSCULAR | Status: AC | PRN
  Administered 2018-07-27: 14:00:00 100 mL via INTRAVENOUS

## 2018-07-27 MED ORDER — MORPHINE SULFATE (PF) 4 MG/ML IV SOLN
6.0000 mg | Freq: Once | INTRAVENOUS | Status: AC
  Administered 2018-07-27: 6 mg via INTRAVENOUS
  Filled 2018-07-27: qty 2

## 2018-07-27 MED ORDER — ONDANSETRON HCL 4 MG/2ML IJ SOLN
4.0000 mg | Freq: Once | INTRAMUSCULAR | Status: AC | PRN
  Administered 2018-07-27: 4 mg via INTRAVENOUS
  Filled 2018-07-27 (×3): qty 2

## 2018-07-27 NOTE — ED Notes (Signed)
Pt unable to provide urine sample at this time 

## 2018-07-27 NOTE — ED Notes (Signed)
UA specimen sent to lab

## 2018-07-27 NOTE — ED Notes (Signed)
Patient transported to CT 

## 2018-07-27 NOTE — ED Notes (Signed)
Pt c/o headache 5/10. Ptdenies  nausea at this time

## 2018-07-27 NOTE — ED Triage Notes (Signed)
Pt arrives with complaints of upper mid abdominal pain and nausea/vomitting. Pt reports symptoms started last night. HX of pancreatitis; pt reports this feels like her normal flare.

## 2018-07-27 NOTE — ED Notes (Signed)
Pt able to ambulate to toilet with steady gait.

## 2018-07-27 NOTE — ED Notes (Signed)
[place]  [facility]  CALLED  FOR TRANSPORT TO  [facility]  RN  Lenord Fellers

## 2018-07-27 NOTE — Telephone Encounter (Signed)
Pt received b12 injection and stopped at [employer] and got hash browns; last night pt began with vomiting and mucus like diarrhea. Had diarrhea x 4 since last night. Pt vomiting on phone while I am talking with pt; pt is very SOB and cough after vomiting. Pt has chronic pancreatitis; pts mouth is dry and she cannot keep anything down; pt has CP and stomach spasms pt thinks from vomiting. Pt is not sure where her thermometer is and does not feel hot right now. Pt is not sure if any fever. Pt has not  Traveled and no known exposure to positive covid or flu. Pt is by herself and with dry mouth and dizziness pt should not drive. Pt asked me to call 911. I called 911 and stayed on phone with pt until EMTS arrived. Tammy Sours at [facility] ED notified of pt coming. FYI to Dr Para March.

## 2018-07-27 NOTE — ED Notes (Signed)
XRAY  POWERSHARE  WITH  [facility] 

## 2018-07-27 NOTE — ED Provider Notes (Addendum)
[facility], [facility] Emergency Department Provider Note   ____________________________________________   First MD Initiated Contact with Patient 07/27/18 1202     (approximate)  I have reviewed the triage vital signs and the nursing notes.   HISTORY  Chief Complaint Abdominal Pain    HPI Abigail [name] is a 58 y.o. female who reports mild headache last night and abdominal pain.  Abdominal pain was bearable until she started vomiting and then her pain became severe.  While she was vomiting her headache also became severe.  She has had headaches like this before.  She thinks the pain is a flare of her pancreatitis.  Pain is severe mid abdomen worse with palpation worse when she tries to eat.         Past Medical History:  Diagnosis Date  . Alcoholism (HCC)   . Cholecystitis   . GERD (gastroesophageal reflux disease)   . Hypertension   . Pancreatitis 10/2015   2013 alcohol and/or gallstone  . Photosensitivity    (No formal dx of lupus) [facility] [place]  . Portal vein thrombosis 2019    Patient Active Problem List   Diagnosis Date Noted  . Cellulitis of antihelix of right ear 05/01/2018  . Colon cancer screening 11/24/2017  . Right knee pain 06/30/2017  . Neck strain 06/17/2017  . Portal vein thrombosis 05/27/2017  . Sepsis (HCC) 05/13/2017  . History of acute pancreatitis 10/26/2015  . Other migraine without status migrainosus, not intractable 09/17/2015  . Sciatica 10/10/2014  . Routine general medical examination at a health care facility 06/05/2014  . Advance care planning 06/05/2014  . Pain in joint, shoulder region 07/19/2013  . History of alcohol abuse 09/13/2012  . Anxiety 04/10/2012  . B12 deficiency 01/31/2010  . Pain in joint 01/31/2010  . Essential hypertension 08/16/2006  . ASTHMA, EXTRINSIC NOS 08/16/2006  . ESOPHAGITIS 08/16/2006  . GERD 08/16/2006    Past Surgical History:  Procedure Laterality Date  .  APPENDECTOMY     As a child  . CAT scan  11/00   wnl  . CHOLECYSTECTOMY  04/20/2012   Procedure: LAPAROSCOPIC CHOLECYSTECTOMY WITH INTRAOPERATIVE CHOLANGIOGRAM;  Surgeon: Robyne AskewPaul S Toth III, MD;  [facility];  Service: General;  Laterality: N/A;  . COLONOSCOPY  2005  . DOBUTAMINE STRESS ECHO  11/00   wnl  . DOPPLER ECHOCARDIOGRAPHY     Secondary to [place], wnl  . ESOPHAGOGASTRODUODENOSCOPY  8/01   Esophagitis [name]([place])  . FOOT SURGERY  04/2015   Pin in small toe  . 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 NSVD     X 2  . SBFT  08/01   wnl  . TONSILLECTOMY     As a child  . TUBAL LIGATION  Late 20's    Prior to Admission medications   Medication Sig Start Date End Date Taking? Authorizing Provider  albuterol (PROVENTIL HFA;VENTOLIN HFA) 108 (90 Base) MCG/ACT inhaler Inhale 2 puffs into the lungs every 4 (four) hours as needed for wheezing or shortness of breath. 06/20/18  Yes Doreene Nestlark, Katherine K, NP  cyanocobalamin (,VITAMIN B-12,) 1000 MCG/ML injection Inject 1,000 mcg into the muscle every 14 (fourteen) days.   Yes [provider]  gabapentin (NEURONTIN) 300 MG capsule TAKE 2 CAPSULES BY MOUTH TWO TIMES A DAY 03/15/18  Yes Joaquim Namuncan, Graham S, MD  lidocaine (XYLOCAINE) 2 % solution Use 5 ml up to 4 times a day as needed. 07/08/18  Yes Para Marchuncan,  Dwana Curd, MD  metoprolol tartrate (LOPRESSOR) 50 MG tablet TAKE 1 TABLET (50 MG TOTAL) BY MOUTH 2 (TWO) TIMES DAILY. 06/13/18  Yes Joaquim Nam, MD  ondansetron (ZOFRAN) 4 MG tablet TAKE 1 TO 2 TABLETS BY MOUTH EVERY 8 HOURS AS NEEDED FOR NAUSEA OR VOMITING 07/16/17  Yes Joaquim Nam, MD  pantoprazole (PROTONIX) 40 MG tablet Take 1 tablet (40 mg total) by mouth daily. 01/06/18  Yes Joaquim Nam, MD  promethazine (PHENERGAN) 12.5 MG tablet TAKE ONE TABLET BY MOUTH EVERY 6 HOURS AS NEEDED FOR NAUSEA/VOMITING 05/23/18  Yes Joaquim Nam, MD  traMADol (ULTRAM) 50 MG tablet Take 1 tablet (50 mg total) by mouth every 8 (eight)  hours as needed. for pain 07/08/18  Yes Joaquim Nam, MD  venlafaxine [facility]) 75 MG tablet Take 1 tablet (75 mg total) by mouth 2 (two) times daily. 03/27/18  Yes Joaquim Nam, MD    Allergies Latex; Atorvastatin; Dilaudid [hydromorphone hcl]; Lexapro [escitalopram oxalate]; Lisinopril; Mirtazapine; Nsaids; and Tylenol [acetaminophen]  Family History  Problem Relation Age of Onset  . Heart disease Mother        CAD, angioplasty twice with stents  . Cancer Mother 29       Breast  . Breast cancer Mother 34  . Hypertension Mother   . Peripheral Artery Disease Mother   . Hypertension Father   . Hypothyroidism Father   . Heart disease Father        MI at age 52  . Cancer Father 74       H/O renal CA  . Diabetes Sister   . Cancer Sister 57       Renal CA  . Factor V Leiden deficiency Brother   . Clotting disorder Brother   . Colon cancer Neg Hx     Social History Social History   Tobacco Use  . Smoking status: Never Smoker  . Smokeless tobacco: Never Used  Substance Use Topics  . Alcohol use: Yes    Alcohol/week: 3.0 standard drinks    Types: 2 Glasses of wine, 1 Cans of beer per week    Comment: cvouple glsses of wine over weekdend and beer monday   . Drug use: No     Constitutional: No fever/chills Eyes: No visual changes. ENT: No sore throat. Cardiovascular: Denies chest pain. Respiratory: Denies shortness of breath. Gastrointestinal:  abdominal pain.   nausea,  vomiting.  No diarrhea.  No constipation. Genitourinary: Negative for dysuria. Musculoskeletal: Negative for back pain. Skin: Negative for rash. Neurological: Negative for headaches, focal weakness  ____________________________________________   PHYSICAL EXAM:  VITAL SIGNS: ED Triage Vitals  Enc Vitals Group     BP 07/27/18 1158 127/82     Pulse Rate 07/27/18 1158 92     Resp 07/27/18 1158 20     Temp 07/27/18 1158 98.2 F (36.8 C)     Temp Source 07/27/18 1158 Oral     SpO2 07/27/18  1158 100 %     Weight 07/27/18 1159 160 lb (72.6 kg)     Height 07/27/18 1159 5' (1.524 m)     Head Circumference --      Peak Flow --      Pain Score 07/27/18 1159 9     Pain Loc --      Pain Edu? --      Excl. in GC? --     Constitutional: Alert and oriented.  Appears to be in pain Eyes: Conjunctivae  are normal. PER. EOMI. Head: Atraumatic. Nose: No congestion/rhinnorhea. Mouth/Throat: Mucous membranes are moist.  Oropharynx non-erythematous. Neck: No stridor.  Cardiovascular: Normal rate, regular rhythm. Grossly normal heart sounds.  Good peripheral circulation. Respiratory: Normal respiratory effort.  No retractions. Lungs CTAB. Gastrointestinal: Soft tender to palpation especially in the upper abdomen. No distention. No abdominal bruits. No CVA tenderness. Musculoskeletal: No lower extremity tenderness nor edema.  Neurologic:  Normal speech and language. No gross focal neurologic deficits are appreciated.  Skin:  Skin is warm, dry and intact. No rash noted.   ____________________________________________   LABS (all labs ordered are listed, but only abnormal results are displayed)  Labs Reviewed  CBC WITH DIFFERENTIAL/PLATELET - Abnormal; Notable for the following components:      Result Value   WBC 17.5 (*)    Neutro Abs 14.2 (*)    Monocytes Absolute 1.6 (*)    All other components within normal limits  COMPREHENSIVE METABOLIC PANEL - Abnormal; Notable for the following components:   Potassium 3.0 (*)    CO2 20 (*)    Glucose, Bld 152 (*)    Calcium 6.8 (*)    Total Protein 5.1 (*)    Albumin 1.9 (*)    All other components within normal limits  LIPASE, BLOOD  URINALYSIS, COMPLETE (UACMP) WITH MICROSCOPIC   ____________________________________________  EKG   ____________________________________________  RADIOLOGY  ED MD interpretation: CT read by radiology as several fluid collections in the abdomen 1 of which could be a pancreatic pseudocyst.  There is  also inflammation of the stomach around a Roux-en-Y anastomosis.  Official radiology report(s): Ct Head Wo Contrast  Result Date: 07/27/2018 CLINICAL DATA:  Acute onset severe headache; dizziness EXAM: CT HEAD WITHOUT CONTRAST TECHNIQUE: Contiguous axial images were obtained from the base of the skull through the vertex without intravenous contrast. COMPARISON:  Head CT September 19, 2015 and brain MRI September 26, 2015 FINDINGS: Brain: The ventricles are normal in size and configuration. There is no intracranial mass, hemorrhage, extra-axial fluid collection, or midline shift. Brain parenchyma appears unremarkable. No acute infarct evident. Vascular: No hyperdense vessel evident. There is no appreciable vascular calcification. Skull: Bony calvarium appears intact. Sinuses/Orbits: Visualized paranasal sinuses are clear. Visualized orbits appear symmetric bilaterally. Other: Mastoid air cells are clear. IMPRESSION: Study within normal limits. Electronically Signed   By: Bretta Bang III M.D.   On: 07/27/2018 14:06   Ct Abdomen Pelvis W Contrast  Result Date: 07/27/2018 CLINICAL DATA:  58 year old with acute abdominal pain. Vomiting and diarrhea. History of chronic pancreatitis. EXAM: CT ABDOMEN AND PELVIS WITH CONTRAST TECHNIQUE: Multidetector CT imaging of the abdomen and pelvis was performed using the standard protocol following bolus administration of intravenous contrast. CONTRAST:  OMNIPAQUE IOHEXOL 300 MG/ML  SOLN COMPARISON:  MRI 09/28/2017 and CT 09/14/2017 FINDINGS: Lower chest: Bibasilar atelectasis, left side greater than right. Small amount of left pleural fluid. Stable pleural-based density along the right lung base on sequence 2, image 12 probably represents scarring. Hepatobiliary: Diffuse low-attenuation of the liver is compatible with hepatic steatosis. Trace perihepatic ascites. Cholecystectomy. Main portal venous system is patent. Again noted is a subcentimeter low-density structure  along the anterior right hepatic lobe that probably represents a cyst based on the previous MRI. No significant biliary dilatation. Focal low-density areas near the cholecystectomy could represent focal fat in the liver. Pancreas: Tiny low-density areas in the distal pancreatic body region may represent small cysts. Minimal stranding around the pancreas. There is a new low-density collection  situated between the distal pancreatic tail and the posterior stomach body. This peripancreatic collection measures 3.0 x 3.2 x 2.9 cm. No gas within this collection. There appears to be a tubular track associated with this structure and the stomach and raises the possibility of previous cystogastrostomy. Spleen: Normal in size without focal abnormality. Adrenals/Urinary Tract: Normal adrenal glands. Normal appearance of the urinary bladder. No suspicious renal lesions. No hydronephrosis. Stomach/Bowel: Evidence for Roux-en-Y gastric bypass procedure. Concern for mucosal thickening at the gastrojejunostomy anastomosis on sequence 2, image 19. Inflammatory changes centered around the stomach. There is diffuse wall thickening of the bypassed stomach. There is low-density fluid collection situated along the cephalad aspect of the stomach. This collection measures 6.1 x 4.0 x 5.3 cm. This fluid collection appears to be compressing the stomach and could be within the submucosal space. There is another small fluid collection in the medial upper abdomen on sequence 2, image 23 that measures roughly 1.8 cm. Oral contrast in the small bowel. Small amount of wall thickening in the efferent loop the Roux-en-Y on sequence 2, image 38. Overall, there is no concern for a bowel obstruction. No gross abnormality to the terminal ileum. Appendix is not present. Vascular/Lymphatic: Normal caliber of the abdominal aorta. The main visceral arteries are patent. Again noted are multiple small lymph nodes throughout the retroperitoneum. Prominent  mesenteric lymph nodes in the anterior abdomen and inferior to the stomach. These lymph nodes may be reactive in etiology. Reproductive: Uterus and bilateral adnexa are unremarkable. Other: Small amount of free fluid in the pelvis. Scattered areas of mesenteric and omental edema particularly in the left upper quadrant. Small amount of fluid in left upper quadrant of the abdomen. Negative for free air. Trace perihepatic ascites. Musculoskeletal: Mild anterolisthesis at L4-L5 secondary to facet arthropathy. No acute bone abnormality. IMPRESSION: 1. Inflammatory changes in the left upper quadrant centered around the stomach and pancreas. Patient has developed a large fluid collection (measuring 6.1 x 4.0 x 5.3 cm) along the superior aspect of the stomach and second prominent fluid collection between the pancreas and the stomach (measuring 3.0 x 3.2 x 2.9 cm). Based on the history of chronic pancreatitis, findings are concerning for pseudocyst formations. In addition, there is diffuse wall thickening of the bypassed stomach. Concern for mucosal thickening at the surgical gastrojejunostomy but no evidence for bowel obstruction. 2. Hepatic steatosis. 3. Small left pleural effusion. 4. Small amount of ascites and scattered areas of omental and mesenteric edema. Slightly prominent mesenteric lymph nodes may be reactive. Electronically Signed   By: Richarda Overlie M.D.   On: 07/27/2018 14:41    ____________________________________________   PROCEDURES  Procedure(s) performed (including Critical Care):  Procedures   ____________________________________________   INITIAL IMPRESSION / ASSESSMENT AND PLAN / ED COURSE  Discussed CT report with surgery on-call.  He reports that nobody here takes care pancreatic pseudocyst or anything like what she appears to have and I should transfer the patient probably to [facility] or [facility].  Patient wants to go to [facility] or [facility] as her insurance does not cover [facility].         Please  note patient has no known coronavirus exposures, no coughing and no shortness of breath.  She has no fever.     ____________________________________________   FINAL CLINICAL IMPRESSION(S) / ED DIAGNOSES  Final diagnoses:  Left upper quadrant pain     ED Discharge Orders    None       Note:  This document was  prepared using Conservation officer, historic buildings and may include unintentional dictation errors.    Arnaldo Natal, MD 07/27/18 1600    Arnaldo Natal, MD 07/27/18 [phone]

## 2018-07-27 NOTE — Telephone Encounter (Signed)
Noted. Agreed.  Thanks.  I really appreciate Abigail [name] taking care of this.

## 2018-08-03 ENCOUNTER — Telehealth: Payer: Self-pay | Admitting: Family Medicine

## 2018-08-03 MED ORDER — VITRUM SENIOR PO TABS
1.00 | ORAL_TABLET | ORAL | Status: DC
Start: 2018-08-04 — End: 2018-08-03

## 2018-08-03 MED ORDER — GENERIC EXTERNAL MEDICATION
Status: DC
Start: ? — End: 2018-08-03

## 2018-08-03 MED ORDER — METOPROLOL TARTRATE 50 MG PO TABS
50.00 | ORAL_TABLET | ORAL | Status: DC
Start: 2018-08-03 — End: 2018-08-03

## 2018-08-03 MED ORDER — GABAPENTIN 300 MG PO CAPS
600.00 | ORAL_CAPSULE | ORAL | Status: DC
Start: 2018-08-03 — End: 2018-08-03

## 2018-08-03 MED ORDER — PANTOPRAZOLE SODIUM 40 MG PO TBEC
40.00 | DELAYED_RELEASE_TABLET | ORAL | Status: DC
Start: 2018-08-03 — End: 2018-08-03

## 2018-08-03 MED ORDER — TRAMADOL HCL 50 MG PO TABS
50.00 | ORAL_TABLET | ORAL | Status: DC
Start: ? — End: 2018-08-03

## 2018-08-03 MED ORDER — ACETAMINOPHEN 325 MG PO TABS
650.00 | ORAL_TABLET | ORAL | Status: DC
Start: 2018-08-03 — End: 2018-08-03

## 2018-08-03 MED ORDER — LIDOCAINE HCL 1 % IJ SOLN
.50 | INTRAMUSCULAR | Status: DC
Start: ? — End: 2018-08-03

## 2018-08-03 MED ORDER — ALBUTEROL SULFATE HFA 108 (90 BASE) MCG/ACT IN AERS
2.00 | INHALATION_SPRAY | RESPIRATORY_TRACT | Status: DC
Start: ? — End: 2018-08-03

## 2018-08-03 MED ORDER — PROMETHAZINE HCL 25 MG/ML IJ SOLN
25.00 | INTRAMUSCULAR | Status: DC
Start: ? — End: 2018-08-03

## 2018-08-03 MED ORDER — ONDANSETRON HCL 4 MG/2ML IJ SOLN
4.00 | INTRAMUSCULAR | Status: DC
Start: ? — End: 2018-08-03

## 2018-08-03 MED ORDER — VENLAFAXINE HCL 75 MG PO TABS
75.00 | ORAL_TABLET | ORAL | Status: DC
Start: 2018-08-03 — End: 2018-08-03

## 2018-08-03 MED ORDER — GENERIC EXTERNAL MEDICATION
100.00 | Status: DC
Start: 2018-08-04 — End: 2018-08-03

## 2018-08-03 NOTE — Telephone Encounter (Signed)
Needs hospital f/u scheduled.  Thanks.

## 2018-08-04 NOTE — Telephone Encounter (Signed)
lvm to schedule virtual appt if possible for hospital follow up.

## 2018-08-05 ENCOUNTER — Telehealth: Payer: Self-pay | Admitting: Family Medicine

## 2018-08-05 DIAGNOSIS — K861 Other chronic pancreatitis: Secondary | ICD-10-CM

## 2018-08-05 NOTE — Telephone Encounter (Signed)
Best number [phone] Pt called stating she was discharged from [facility] 4/29. She has an appointment here for b12 inj on Tuesday and wanted to know if you wanted to see her then for hospital follow up.  Pt has capability to do virtual if you prefer that    In office or virtual?

## 2018-08-07 NOTE — Telephone Encounter (Signed)
Would get f/u CBC and CMET, ordered.  Needs that with B12 shot and then virtual visit. Thanks.   ======================   Inpatient notes from [facility] reviewed, pasted here for reference.   During your hospitalization we did the following things for you: Review of the imaging at the outside hospital showed two fluid collections (7.8 x 6.6 cm and 3.5 x 3.1 cm) in the abdomen, likely due to the stomach remnant from your surgery. You had a EGD that was normal. The surgeons and interventional radiologists consulted on your case, and there was no safe intervention to drain the fluid collections. Repeat imaging the day before discharge showed the fluid collections were smaller (3 x 4.2 cm and 2.6 x 1.9 cm). We recommend following up with your primary care doctor and getting repeat imaging in 6-8 weeks to see if the fluid collections are getting smaller. You can continue pain control with Tylenol and tramadol.  For nutrition, recommend small meals and protein supplements (can try Ensure or Carnation supplements).   We have made changes to your medications. The following medications have been changed:  1) Take protonix 40mg  twice a day  2) For treatment of H pylori, please take metronidazole 500mg  every 8 hours for 14 days. Take Pepto Bismo 524 mg four times a day for 10 days. Take doxycycline 100mg  every 12 hours for 10 days.  3) For pain control, can take tramadol 50mg  every 8 hours as needed and 650mg  Tylenol every 6 hours as needed

## 2018-08-09 ENCOUNTER — Ambulatory Visit (INDEPENDENT_AMBULATORY_CARE_PROVIDER_SITE_OTHER): Payer: BLUE CROSS/BLUE SHIELD | Admitting: Family Medicine

## 2018-08-09 ENCOUNTER — Ambulatory Visit (INDEPENDENT_AMBULATORY_CARE_PROVIDER_SITE_OTHER): Payer: BLUE CROSS/BLUE SHIELD

## 2018-08-09 ENCOUNTER — Other Ambulatory Visit: Payer: Self-pay

## 2018-08-09 ENCOUNTER — Other Ambulatory Visit (INDEPENDENT_AMBULATORY_CARE_PROVIDER_SITE_OTHER): Payer: BLUE CROSS/BLUE SHIELD

## 2018-08-09 DIAGNOSIS — R109 Unspecified abdominal pain: Secondary | ICD-10-CM | POA: Diagnosis not present

## 2018-08-09 DIAGNOSIS — K861 Other chronic pancreatitis: Secondary | ICD-10-CM | POA: Diagnosis not present

## 2018-08-09 DIAGNOSIS — E538 Deficiency of other specified B group vitamins: Secondary | ICD-10-CM | POA: Diagnosis not present

## 2018-08-09 MED ORDER — TRAMADOL HCL 50 MG PO TABS: 50.0000 mg | ORAL_TABLET | Freq: Three times a day (TID) | ORAL | 1 refills | Status: DC | PRN

## 2018-08-09 MED ORDER — PANTOPRAZOLE SODIUM 40 MG PO TBEC: 40.0000 mg | DELAYED_RELEASE_TABLET | Freq: Two times a day (BID) | ORAL | Status: DC

## 2018-08-09 MED ORDER — CYANOCOBALAMIN 1000 MCG/ML IJ SOLN
1000.0000 ug | Freq: Once | INTRAMUSCULAR | Status: AC
  Administered 2018-08-09: 1000 ug via INTRAMUSCULAR

## 2018-08-09 NOTE — Telephone Encounter (Signed)
Appointment 5/5 

## 2018-08-09 NOTE — Progress Notes (Signed)
Per orders of Dr. Para March, injection of Vitamin B12 given by Kizzie Ide, RN. Patient tolerated injection well.  Injection administered IM to Right Deltoid.

## 2018-08-09 NOTE — Progress Notes (Signed)
Virtual visit completed through WebEx or similar program Patient location: home  Provider location: [place] [facility], office   Limitations and rationale for visit method d/w patient.  Patient agreed to proceed.   CC: inpatient f/u.    HPI:  ===================== Inpatient notes from [facility] reviewed, pasted here for reference.   During your hospitalization we did the following things for you: Review of the imaging at the outside hospital showed two fluid collections (7.8 x 6.6 cm and 3.5 x 3.1 cm) in the abdomen, likely due to the stomach remnant from your surgery. You had a EGD that was normal. The surgeons and interventional radiologists consulted on your case, and there was no safe intervention to drain the fluid collections. Repeat imaging the day before discharge showed the fluid collections were smaller (3 x 4.2 cm and 2.6 x 1.9 cm). We recommend following up with your primary care doctor and getting repeat imaging in 6-8 weeks to see if the fluid collections are getting smaller. You can continue pain control with Tylenol and tramadol.  For nutrition, recommend small meals and protein supplements (can try Ensure or Carnation supplements).   We have made changes to your medications. The following medications have been changed:  1) Take protonix 40mg  twice a day  2) For treatment of H pylori, please take metronidazole 500mg  every 8 hours for 14 days. Take Pepto Bismo 524 mg four times a day for 10 days. Take doxycycline 100mg  every 12 hours for 10 days.  3) For pain control, can take tramadol 50mg  every 8 hours as needed and 650mg  Tylenol every 6 hours as needed  ======================== Inpatient course discussed with patient.  She had severe abdominal pain and went to [facility] EMS.  She had abnormal imaging with concern for pancreatic cyst formation and was transferred to [facility] care.  Endoscopy and follow-up imaging done there.  She did not have a fluid collection that  was amenable to drainage.  She had endoscopy done.  She is being treated for H. pylori infection per [facility] instructions.  She is finishing doxy, still on flagyl.  She is compliant with treatment.  Her abdominal pain improved to the point where she was able to be discharged home.  She had virtual visit today for follow-up.  She is going to be set up with bariatric clinic, given her history of abdominal surgery.  She doesn't need a referral from me, but will update me as needed.    She'll need f/u imaging either here of there.   D/w pt.  See above.  She will update me if she is going to need me to order the imaging.  Fortunately she has minimal abd pain now.  She is eating better, taking small meals more often.  No fevers.  No blood in stool.  No vomiting but some occ acidic regurgitation.    She still has black stools.  She is still taking pepto which could cause black stools.    She needed refill on tramadol, rx done at visit.    Past medical history reviewed.  Meds and allergies reviewed.   ROS: Per HPI unless specifically indicated in ROS section   NAD Speech wnl  A/P:  Abdominal pain.  History of pancreatitis.  Currently on treatment for H. pylori.  Would finish out treatment for H. pylori.  Fluid accumulation, localized, noted above. She feels better in the meantime.  She has follow-up labs pending. She has history of black stools that  could be attributable to Pepto-Bismol use. Needs f/u based on sx- she'll update me prn.  She needs bariatric clinic f/u and f/u imaging.  She will let me know if she needs a referral to the bariatric clinic.  They may take care of the imaging.  I placed a reminder in the EMR about this for follow-up in about 6 to 8 weeks.  She understood all of this and will update me as needed.

## 2018-08-10 LAB — COMPREHENSIVE METABOLIC PANEL
ALT: 18 U/L (ref 0–35)
AST: 40 U/L — ABNORMAL HIGH (ref 0–37)
Albumin: 2.4 g/dL — ABNORMAL LOW (ref 3.5–5.2)
Alkaline Phosphatase: 114 U/L (ref 39–117)
BUN: 4 mg/dL — ABNORMAL LOW (ref 6–23)
CO2: 28 mEq/L (ref 19–32)
Calcium: 8.3 mg/dL — ABNORMAL LOW (ref 8.4–10.5)
Chloride: 100 mEq/L (ref 96–112)
Creatinine, Ser: 0.62 mg/dL (ref 0.40–1.20)
GFR: 99.77 mL/min (ref >60.00)
Glucose, Bld: 92 mg/dL (ref 70–99)
Potassium: 4.3 mEq/L (ref 3.5–5.1)
Sodium: 136 mEq/L (ref 135–145)
Total Bilirubin: 0.6 mg/dL (ref 0.2–1.2)
Total Protein: 7.2 g/dL (ref 6.0–8.3)

## 2018-08-10 LAB — CBC WITH DIFFERENTIAL/PLATELET
Basophils Absolute: 0.1 10*3/uL (ref 0.0–0.1)
Basophils Relative: 1.4 % (ref 0.0–3.0)
Eosinophils Absolute: 0.1 10*3/uL (ref 0.0–0.7)
Eosinophils Relative: 0.7 % (ref 0.0–5.0)
HCT: 41.2 % (ref 36.0–46.0)
Hemoglobin: 14.1 g/dL (ref 12.0–15.0)
Lymphocytes Relative: 22.7 % (ref 12.0–46.0)
Lymphs Abs: 1.8 10*3/uL (ref 0.7–4.0)
MCHC: 34.1 g/dL (ref 30.0–36.0)
MCV: 98.2 fl (ref 78.0–100.0)
Monocytes Absolute: 0.7 10*3/uL (ref 0.1–1.0)
Monocytes Relative: 8.6 % (ref 3.0–12.0)
Neutro Abs: 5.4 10*3/uL (ref 1.4–7.7)
Neutrophils Relative %: 66.6 % (ref 43.0–77.0)
Platelets: 844 10*3/uL — ABNORMAL HIGH (ref 150.0–400.0)
RBC: 4.2 Mil/uL (ref 3.87–5.11)
RDW: 14.2 % (ref 11.5–15.5)
WBC: 8.1 10*3/uL (ref 4.0–10.5)

## 2018-08-11 ENCOUNTER — Encounter: Payer: Self-pay | Admitting: Family Medicine

## 2018-08-11 ENCOUNTER — Other Ambulatory Visit: Payer: Self-pay | Admitting: Family Medicine

## 2018-08-11 DIAGNOSIS — R109 Unspecified abdominal pain: Secondary | ICD-10-CM | POA: Insufficient documentation

## 2018-08-11 DIAGNOSIS — R1030 Lower abdominal pain, unspecified: Secondary | ICD-10-CM | POA: Insufficient documentation

## 2018-08-11 NOTE — Assessment & Plan Note (Signed)
Abdominal pain.  History of pancreatitis.  Currently on treatment for H. pylori.  Would finish out treatment for H. pylori.  Fluid accumulation, localized, noted above. She feels better in the meantime.  She has follow-up labs pending. She has history of black stools that could be attributable to Pepto-Bismol use. Needs f/u based on sx- she'll update me prn.  She needs bariatric clinic f/u and f/u imaging.  She will let me know if she needs a referral to the bariatric clinic.  They may take care of the imaging.  I placed a reminder in the EMR about this for follow-up in about 6 to 8 weeks.  She understood all of this and will update me as needed.

## 2018-08-15 NOTE — Telephone Encounter (Signed)
Pt was seen on 08/09/18

## 2018-08-22 ENCOUNTER — Other Ambulatory Visit: Payer: Self-pay | Admitting: Family Medicine

## 2018-08-22 NOTE — Telephone Encounter (Signed)
Refill request for Promethazine. LOV 08/09/2018 for abdominal pain. Last refill was 05/23/2018 for 30 tablets with 1 refill. No future appointments

## 2018-08-23 ENCOUNTER — Ambulatory Visit: Payer: BLUE CROSS/BLUE SHIELD

## 2018-08-23 ENCOUNTER — Ambulatory Visit (INDEPENDENT_AMBULATORY_CARE_PROVIDER_SITE_OTHER): Payer: BLUE CROSS/BLUE SHIELD

## 2018-08-23 ENCOUNTER — Other Ambulatory Visit (INDEPENDENT_AMBULATORY_CARE_PROVIDER_SITE_OTHER): Payer: BLUE CROSS/BLUE SHIELD

## 2018-08-23 DIAGNOSIS — R109 Unspecified abdominal pain: Secondary | ICD-10-CM | POA: Diagnosis not present

## 2018-08-23 DIAGNOSIS — E538 Deficiency of other specified B group vitamins: Secondary | ICD-10-CM

## 2018-08-23 LAB — CBC WITH DIFFERENTIAL/PLATELET
Basophils Absolute: 0 10*3/uL (ref 0.0–0.1)
Basophils Relative: 0.6 % (ref 0.0–3.0)
Eosinophils Absolute: 0 10*3/uL (ref 0.0–0.7)
Eosinophils Relative: 0.3 % (ref 0.0–5.0)
HCT: 38.5 % (ref 36.0–46.0)
Hemoglobin: 13.1 g/dL (ref 12.0–15.0)
Lymphocytes Relative: 47.3 % — ABNORMAL HIGH (ref 12.0–46.0)
Lymphs Abs: 2.4 10*3/uL (ref 0.7–4.0)
MCHC: 33.9 g/dL (ref 30.0–36.0)
MCV: 96.6 fl (ref 78.0–100.0)
Monocytes Absolute: 0.5 10*3/uL (ref 0.1–1.0)
Monocytes Relative: 10.3 % (ref 3.0–12.0)
Neutro Abs: 2.1 10*3/uL (ref 1.4–7.7)
Neutrophils Relative %: 41.5 % — ABNORMAL LOW (ref 43.0–77.0)
Platelets: 327 10*3/uL (ref 150.0–400.0)
RBC: 3.99 Mil/uL (ref 3.87–5.11)
RDW: 13.4 % (ref 11.5–15.5)
WBC: 5.1 10*3/uL (ref 4.0–10.5)

## 2018-08-23 LAB — COMPREHENSIVE METABOLIC PANEL
ALT: 16 U/L (ref 0–35)
AST: 60 U/L — ABNORMAL HIGH (ref 0–37)
Albumin: 2.4 g/dL — ABNORMAL LOW (ref 3.5–5.2)
Alkaline Phosphatase: 75 U/L (ref 39–117)
BUN: 6 mg/dL (ref 6–23)
CO2: 27 mEq/L (ref 19–32)
Calcium: 7.8 mg/dL — ABNORMAL LOW (ref 8.4–10.5)
Chloride: 101 mEq/L (ref 96–112)
Creatinine, Ser: 0.58 mg/dL (ref 0.40–1.20)
GFR: 107.74 mL/min (ref >60.00)
Glucose, Bld: 113 mg/dL — ABNORMAL HIGH (ref 70–99)
Potassium: 4 mEq/L (ref 3.5–5.1)
Sodium: 135 mEq/L (ref 135–145)
Total Bilirubin: 0.5 mg/dL (ref 0.2–1.2)
Total Protein: 6.5 g/dL (ref 6.0–8.3)

## 2018-08-23 MED ORDER — CYANOCOBALAMIN 1000 MCG/ML IJ SOLN
1000.0000 ug | Freq: Once | INTRAMUSCULAR | Status: AC
  Administered 2018-08-23: 1000 ug via INTRAMUSCULAR

## 2018-08-23 NOTE — Telephone Encounter (Signed)
Sent. Thanks.   

## 2018-08-23 NOTE — Progress Notes (Signed)
Per orders of Dr. Para March, injection of B12-gets this EVERY 2 weeks right now given by Consuella Lose. Patient tolerated injection well.

## 2018-08-26 ENCOUNTER — Telehealth: Payer: Self-pay

## 2018-08-26 NOTE — Telephone Encounter (Signed)
Left message for patient to call back  

## 2018-09-08 ENCOUNTER — Ambulatory Visit (INDEPENDENT_AMBULATORY_CARE_PROVIDER_SITE_OTHER): Payer: BLUE CROSS/BLUE SHIELD

## 2018-09-08 DIAGNOSIS — E538 Deficiency of other specified B group vitamins: Secondary | ICD-10-CM | POA: Diagnosis not present

## 2018-09-08 MED ORDER — CYANOCOBALAMIN 1000 MCG/ML IJ SOLN
1000.0000 ug | Freq: Once | INTRAMUSCULAR | Status: AC
  Administered 2018-09-08: 1000 ug via INTRAMUSCULAR

## 2018-09-08 NOTE — Progress Notes (Signed)
Per orders of Dr. Para March, injection of B12-gets this EVERY 2 weeks right now given by Littie Deeds, CMA. Patient tolerated injection well.

## 2018-09-12 ENCOUNTER — Telehealth: Payer: Self-pay | Admitting: Family Medicine

## 2018-09-12 NOTE — Telephone Encounter (Signed)
Please check with patient.  She will need follow-up imaging regarding fluid collections in the abdomen, either through [place] here at [facility] or through outside bariatric clinic.  I'll order if needed.  Thanks.

## 2018-09-12 NOTE — Telephone Encounter (Signed)
Left detailed message on voicemail. DPR 

## 2018-09-21 ENCOUNTER — Telehealth: Payer: Self-pay | Admitting: *Deleted

## 2018-09-21 NOTE — Telephone Encounter (Signed)
Left message on voicemail for patient to call back. When patient calls back she is scheduled for a nurse visit 09/22/18. Need covid screening done and advise her to come to the front of the office and call. Stay in the car and nurse will come out to give the injection.

## 2018-09-22 ENCOUNTER — Ambulatory Visit (INDEPENDENT_AMBULATORY_CARE_PROVIDER_SITE_OTHER): Payer: BLUE CROSS/BLUE SHIELD

## 2018-09-22 ENCOUNTER — Other Ambulatory Visit: Payer: Self-pay

## 2018-09-22 DIAGNOSIS — E538 Deficiency of other specified B group vitamins: Secondary | ICD-10-CM | POA: Diagnosis not present

## 2018-09-22 MED ORDER — CYANOCOBALAMIN 1000 MCG/ML IJ SOLN
1000.0000 ug | Freq: Once | INTRAMUSCULAR | Status: AC
  Administered 2018-09-22: 1000 ug via INTRAMUSCULAR

## 2018-09-22 NOTE — Progress Notes (Signed)
Pt given her every 14 days B12 injection in left deltoid. Tolerated well.

## 2018-10-06 ENCOUNTER — Ambulatory Visit: Payer: BLUE CROSS/BLUE SHIELD

## 2018-10-06 ENCOUNTER — Other Ambulatory Visit: Payer: Self-pay

## 2018-10-27 ENCOUNTER — Other Ambulatory Visit: Payer: Self-pay | Admitting: *Deleted

## 2018-10-27 NOTE — Telephone Encounter (Signed)
Faxed refill request. Pantoprazole Last office visit:  08/09/2018 No CPE recently Please advise.

## 2018-10-28 MED ORDER — PANTOPRAZOLE SODIUM 40 MG PO TBEC: 40.0000 mg | DELAYED_RELEASE_TABLET | Freq: Two times a day (BID) | ORAL | 3 refills | Status: DC

## 2018-10-28 NOTE — Telephone Encounter (Signed)
Letter mailed

## 2018-10-28 NOTE — Telephone Encounter (Signed)
Sent. Thanks.  Reasonable to schedule CPE when possible for the fall.

## 2018-10-31 ENCOUNTER — Other Ambulatory Visit: Payer: Self-pay | Admitting: Family Medicine

## 2018-11-01 NOTE — Telephone Encounter (Signed)
Electronic refill request.: Promethazine Last office visit:   08/09/2018 Last Filled:    30 tablet 1 08/23/2018  Please advise.

## 2018-11-02 NOTE — Telephone Encounter (Signed)
Sent. Thanks.   

## 2018-11-03 ENCOUNTER — Ambulatory Visit (INDEPENDENT_AMBULATORY_CARE_PROVIDER_SITE_OTHER): Payer: BLUE CROSS/BLUE SHIELD | Admitting: *Deleted

## 2018-11-03 DIAGNOSIS — E538 Deficiency of other specified B group vitamins: Secondary | ICD-10-CM | POA: Diagnosis not present

## 2018-11-03 MED ORDER — CYANOCOBALAMIN 1000 MCG/ML IJ SOLN
1000.0000 ug | Freq: Once | INTRAMUSCULAR | Status: AC
  Administered 2018-11-03: 16:00:00 1000 ug via INTRAMUSCULAR

## 2018-11-03 NOTE — Progress Notes (Signed)
Per orders of Dr. Duncan, injection of B12 given by Lyndie Vanderloop M. Patient tolerated injection well. 

## 2018-11-05 ENCOUNTER — Other Ambulatory Visit: Payer: Self-pay | Admitting: Family Medicine

## 2018-11-05 DIAGNOSIS — R519 Headache, unspecified: Secondary | ICD-10-CM

## 2018-11-07 ENCOUNTER — Encounter: Payer: Self-pay | Admitting: Family Medicine

## 2018-11-07 DIAGNOSIS — R519 Headache, unspecified: Secondary | ICD-10-CM | POA: Insufficient documentation

## 2018-11-07 NOTE — Telephone Encounter (Signed)
Sent. Thanks.  Okay to continue. 

## 2018-11-07 NOTE — Telephone Encounter (Signed)
LOV 08/09/2018 for abdominal pain. No future appointments scheduled. Last refilled on 03/15/2018 with 5 refills

## 2018-11-11 ENCOUNTER — Encounter: Payer: Self-pay | Admitting: Family Medicine

## 2018-11-16 ENCOUNTER — Telehealth: Payer: Self-pay

## 2018-11-16 ENCOUNTER — Other Ambulatory Visit: Payer: Self-pay | Admitting: Family Medicine

## 2018-11-16 MED ORDER — "BD INSULIN SYRINGE 25G X 1"" 1 ML MISC": 1.0000 | 0 refills | Status: DC

## 2018-11-16 MED ORDER — CYANOCOBALAMIN 1000 MCG/ML IJ SOLN: 1000.0000 ug | INTRAMUSCULAR | 3 refills | Status: DC

## 2018-11-16 NOTE — Telephone Encounter (Signed)
Appt cancelled. Called patient to confirm with her that it was cancelled.

## 2018-11-16 NOTE — Telephone Encounter (Signed)
[id number]

## 2018-11-16 NOTE — Telephone Encounter (Signed)
[facility] Night - Client Nonclinical Telephone Record AccessNurse Client [facility] Night - Client Client Site [place] - Night Contact Type Call Who Is Calling Patient / Member / Family / Caregiver Caller Name [place] Phone Number [phone] Patient Name Abigail [name] Patient DOB 09-18-1960 Call Type Message Only Information Provided Reason for Call Request to [facility] Appointment Initial Comment Caller stated she would like to cancel her appointment on 11/17/2018 at 8:45. Additional Comment Call Closed By: Netta Corrigan Transaction Date/Time: 11/16/2018 2:15:28 AM (ET)

## 2018-11-16 NOTE — Telephone Encounter (Signed)
Patient returned Emily's call and confirmed she wanted to cancel appointment.

## 2018-11-17 ENCOUNTER — Ambulatory Visit: Payer: BLUE CROSS/BLUE SHIELD

## 2018-11-24 ENCOUNTER — Other Ambulatory Visit: Payer: Self-pay | Admitting: Family Medicine

## 2018-11-25 NOTE — Telephone Encounter (Signed)
LOV 08/09/2018, no future appointments. Last filled on 08/09/2018 #90 with 1 refill.

## 2018-11-27 NOTE — Telephone Encounter (Signed)
Sent. Thanks.   

## 2018-12-01 ENCOUNTER — Ambulatory Visit: Payer: BLUE CROSS/BLUE SHIELD

## 2018-12-01 ENCOUNTER — Other Ambulatory Visit: Payer: Self-pay

## 2018-12-01 ENCOUNTER — Telehealth: Payer: Self-pay | Admitting: Family Medicine

## 2018-12-01 NOTE — Telephone Encounter (Signed)
Left message asking pt to call office I wanted to make sure she is aware her bcbs is out of network

## 2018-12-07 ENCOUNTER — Other Ambulatory Visit: Payer: Self-pay | Admitting: Family Medicine

## 2019-01-06 ENCOUNTER — Telehealth: Payer: Self-pay
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
## 2019-01-06 NOTE — Telephone Encounter (Signed)
Noted. Thanks. Will await the follow up notes.

## 2019-02-26 ENCOUNTER — Other Ambulatory Visit: Payer: Self-pay | Admitting: Family Medicine

## 2019-03-06 ENCOUNTER — Other Ambulatory Visit: Payer: Self-pay

## 2019-03-06 ENCOUNTER — Emergency Department: Payer: BLUE CROSS/BLUE SHIELD

## 2019-03-06 ENCOUNTER — Encounter: Payer: Self-pay | Admitting: Emergency Medicine

## 2019-03-06 ENCOUNTER — Emergency Department
Admission: EM | Admit: 2019-03-06 | Discharge: 2019-03-06 | Disposition: A | Discharge status: Home / Self Care | Payer: BLUE CROSS/BLUE SHIELD | Attending: Student | Admitting: Student

## 2019-03-06 DIAGNOSIS — Z20828 Contact with and (suspected) exposure to other viral communicable diseases: Secondary | ICD-10-CM | POA: Diagnosis not present

## 2019-03-06 DIAGNOSIS — R112 Nausea with vomiting, unspecified: Secondary | ICD-10-CM

## 2019-03-06 DIAGNOSIS — N390 Urinary tract infection, site not specified: Secondary | ICD-10-CM | POA: Diagnosis not present

## 2019-03-06 DIAGNOSIS — I10 Essential (primary) hypertension: Secondary | ICD-10-CM | POA: Diagnosis not present

## 2019-03-06 DIAGNOSIS — Z79899 Other long term (current) drug therapy: Secondary | ICD-10-CM | POA: Diagnosis not present

## 2019-03-06 DIAGNOSIS — R1010 Upper abdominal pain, unspecified: Secondary | ICD-10-CM | POA: Diagnosis not present

## 2019-03-06 DIAGNOSIS — Z91040 Latex allergy status: Secondary | ICD-10-CM | POA: Insufficient documentation

## 2019-03-06 DIAGNOSIS — Z20822 Contact with and (suspected) exposure to covid-19: Secondary | ICD-10-CM

## 2019-03-06 LAB — URINALYSIS, COMPLETE (UACMP) WITH MICROSCOPIC
Bacteria, UA: NONE SEEN
Bilirubin Urine: NEGATIVE
Glucose, UA: NEGATIVE mg/dL
Ketones, ur: NEGATIVE mg/dL
Nitrite: POSITIVE — AB
Protein, ur: NEGATIVE mg/dL
Specific Gravity, Urine: 1.039 — ABNORMAL HIGH (ref 1.005–1.030)
pH: 7 (ref 5.0–8.0)

## 2019-03-06 LAB — CBC
HCT: 39.5 % (ref 36.0–46.0)
Hemoglobin: 13.1 g/dL (ref 12.0–15.0)
MCH: 33 pg (ref 26.0–34.0)
MCHC: 33.2 g/dL (ref 30.0–36.0)
MCV: 99.5 fL (ref 80.0–100.0)
Platelets: 238 10*3/uL (ref 150–400)
RBC: 3.97 MIL/uL (ref 3.87–5.11)
RDW: 12.4 % (ref 11.5–15.5)
WBC: 5.3 10*3/uL (ref 4.0–10.5)
nRBC: 0 % (ref 0.0–0.2)

## 2019-03-06 LAB — COMPREHENSIVE METABOLIC PANEL
ALT: 39 U/L (ref 0–44)
AST: 95 U/L — ABNORMAL HIGH (ref 15–41)
Albumin: 2.3 g/dL — ABNORMAL LOW (ref 3.5–5.0)
Alkaline Phosphatase: 153 U/L — ABNORMAL HIGH (ref 38–126)
Anion gap: 14 (ref 5–15)
BUN: 7 mg/dL (ref 6–20)
CO2: 24 mmol/L (ref 22–32)
Calcium: 8 mg/dL — ABNORMAL LOW (ref 8.9–10.3)
Chloride: 99 mmol/L (ref 98–111)
Creatinine, Ser: 0.75 mg/dL (ref 0.44–1.00)
GFR calc Af Amer: >60 mL/min (ref >60)
GFR calc non Af Amer: >60 mL/min (ref >60)
Glucose, Bld: 130 mg/dL — ABNORMAL HIGH (ref 70–99)
Potassium: 4.5 mmol/L (ref 3.5–5.1)
Sodium: 137 mmol/L (ref 135–145)
Total Bilirubin: 1.2 mg/dL (ref 0.3–1.2)
Total Protein: 6.5 g/dL (ref 6.5–8.1)

## 2019-03-06 LAB — LIPASE, BLOOD: Lipase: 14 U/L (ref 11–51)

## 2019-03-06 MED ORDER — SODIUM CHLORIDE 0.9 % IV BOLUS
1000.0000 mL | Freq: Once | INTRAVENOUS | Status: AC
  Administered 2019-03-06: 1000 mL via INTRAVENOUS

## 2019-03-06 MED ORDER — SODIUM CHLORIDE 0.9 % IV BOLUS
500.0000 mL | Freq: Once | INTRAVENOUS | Status: AC
  Administered 2019-03-06: 19:00:00 500 mL via INTRAVENOUS

## 2019-03-06 MED ORDER — FENTANYL CITRATE (PF) 100 MCG/2ML IJ SOLN
50.0000 ug | Freq: Once | INTRAMUSCULAR | Status: AC
  Administered 2019-03-06: 16:00:00 50 ug via INTRAVENOUS
  Filled 2019-03-06: qty 2

## 2019-03-06 MED ORDER — ONDANSETRON 4 MG PO TBDP
4.0000 mg | ORAL_TABLET | Freq: Once | ORAL | Status: AC
  Administered 2019-03-06: 4 mg via ORAL
  Filled 2019-03-06: qty 1

## 2019-03-06 MED ORDER — CEPHALEXIN 500 MG PO CAPS: 500.0000 mg | ORAL_CAPSULE | Freq: Two times a day (BID) | ORAL | 0 refills | Status: AC

## 2019-03-06 MED ORDER — SODIUM CHLORIDE 0.9 % IV SOLN
1.0000 g | Freq: Once | INTRAVENOUS | Status: AC
  Administered 2019-03-06: 1 g via INTRAVENOUS
  Filled 2019-03-06: qty 10

## 2019-03-06 MED ORDER — IOHEXOL 300 MG/ML  SOLN
100.0000 mL | Freq: Once | INTRAMUSCULAR | Status: AC | PRN
  Administered 2019-03-06: 17:00:00 100 mL via INTRAVENOUS
  Filled 2019-03-06: qty 100

## 2019-03-06 MED ORDER — OXYCODONE HCL 5 MG PO TABS
5.0000 mg | ORAL_TABLET | Freq: Once | ORAL | Status: AC
  Administered 2019-03-06: 5 mg via ORAL
  Filled 2019-03-06: qty 1

## 2019-03-06 NOTE — Discharge Instructions (Signed)
Thank you for letting us take care of you in the emergency department today.   Please continue to take any regular, prescribed medications, especially your reflux medications. Avoid alcohol, spicy food, and greasy food as this can worsen your symptoms.    New medications we have prescribed:  - Keflex - for urine infection, based on your urine tests today.  At this time, your coronavirus swab results are pending. You should hear your results in 1-2 days if they are positive.   In the meantime, it is important to take precautions in case you are positive. This includes quarantining, wearing a mask, and social distancing.   Please return to the ER for any new or worsening symptoms, such as difficulty breathing, vomiting and diarrhea, or chest pain.

## 2019-03-06 NOTE — ED Notes (Signed)
MD at bedside. 

## 2019-03-06 NOTE — ED Triage Notes (Signed)
Pt reports abd pain and NV for the last few days. Pt reports her urine is dark as well.

## 2019-03-06 NOTE — ED Triage Notes (Signed)
Lab called for assistance with lab collection °

## 2019-03-06 NOTE — ED Notes (Signed)
Patient reports abdominal pain above her umbilicus, nausea and vomiting for the past several days.  Patient states she feels dehydrated.  Patient reports having "h. Pylori" and pancreatitis in the past and has felt similar to this in the past.

## 2019-03-06 NOTE — ED Provider Notes (Signed)
[facility] Emergency Department Provider Note  ____________________________________________   First MD Initiated Contact with Patient 03/06/19 1540     (approximate)  I have reviewed the triage vital signs and the nursing notes.  History  Chief Complaint Emesis and Dehydration    HPI Abigail [name] is a 58 y.o. female with history of H. pylori, recurrent complicated pancreatitis, heavy alcohol use, gastric bypass who presents emergency department for nausea, vomiting, upper abdominal pain, concern for dehydration. Emesis is NBNB. Patient reports symptoms have been present for the last few days. She has not had significant PO intake and has noticed her urine is darker than normal, which made her concerned for dehydration.  She reports some associated discomfort in the epigastric area.  This discomfort is described as a cramping, 8/10 in severity, no radiation, no alleviating or aggravating factors.  The pain feels similar to when she has been diagnosed with pancreatitis and/or H. pylori in the past.  She does admit to recent increased alcohol intake due to the holidays, drank several vodka drinks daily over the past few days. Typically drinks about every other day. She denies any diarrhea.  She has not had a bowel movement in several days, which is somewhat atypical for her.  She is status post cholecystectomy.  She does take twice daily PPI, which she reports compliance with.  She denies any fevers, cough, difficulty breathing, chest pain.  She has no known COVID exposures, but does work as a Armed forces operational officer.    Past Medical Hx Past Medical History:  Diagnosis Date  . Alcoholism (HCC)   . Cholecystitis   . GERD (gastroesophageal reflux disease)   . Headache    improved with gabapentin   . Hypertension   . Pancreatitis 10/2015   2013 alcohol and/or gallstone  . Photosensitivity    (No formal dx of lupus) prev eval Dr. Gavin Potters in [place]  . Portal  vein thrombosis 2019    Problem List Patient Active Problem List   Diagnosis Date Noted  . Headache   . Abdominal pain 08/11/2018  . Cellulitis of antihelix of right ear 05/01/2018  . Colon cancer screening 11/24/2017  . Right knee pain 06/30/2017  . Neck strain 06/17/2017  . Portal vein thrombosis 05/27/2017  . Sepsis (HCC) 05/13/2017  . History of acute pancreatitis 10/26/2015  . Other migraine without status migrainosus, not intractable 09/17/2015  . Sciatica 10/10/2014  . Routine general medical examination at a health care facility 06/05/2014  . Advance care planning 06/05/2014  . Pain in joint, shoulder region 07/19/2013  . History of alcohol abuse 09/13/2012  . Anxiety 04/10/2012  . B12 deficiency 01/31/2010  . Pain in joint 01/31/2010  . Essential hypertension 08/16/2006  . ASTHMA, EXTRINSIC NOS 08/16/2006  . ESOPHAGITIS 08/16/2006  . GERD 08/16/2006    Past Surgical Hx Past Surgical History:  Procedure Laterality Date  . APPENDECTOMY     As a child  . CAT scan  11/00   wnl  . CHOLECYSTECTOMY  04/20/2012   Procedure: LAPAROSCOPIC CHOLECYSTECTOMY WITH INTRAOPERATIVE CHOLANGIOGRAM;  Surgeon: Robyne Askew, MD;  Location: [facility] OR;  Service: General;  Laterality: N/A;  . COLONOSCOPY  2005  . DOBUTAMINE STRESS ECHO  11/00   wnl  . DOPPLER ECHOCARDIOGRAPHY     Secondary to [place], wnl  . ESOPHAGOGASTRODUODENOSCOPY  8/01   Esophagitis [name])  . FOOT SURGERY  04/2015   Pin in small toe  . 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 NSVD     X 2  . SBFT  08/01   wnl  . TONSILLECTOMY     As a child  . TUBAL LIGATION  Late 20's    Medications Prior to Admission medications   Medication Sig Start Date End Date Taking? Authorizing Provider  albuterol (PROVENTIL HFA;VENTOLIN HFA) 108 (90 Base) MCG/ACT inhaler Inhale 2 puffs into the lungs every 4 (four) hours as needed for wheezing or shortness of breath. 06/20/18   Doreene Nestlark, Katherine K, NP  bismuth  subsalicylate (PEPTO BISMOL) 262 MG chewable tablet Chew 524 mg by mouth 4 (four) times daily -  before meals and at bedtime.    [provider]  cyanocobalamin (,VITAMIN B-12,) 1000 MCG/ML injection Inject 1 mL (1,000 mcg total) into the muscle every 14 (fourteen) days. 11/16/18   Joaquim Namuncan, Graham S, MD  doxycycline (VIBRAMYCIN) 100 MG capsule Take 100 mg by mouth 2 (two) times daily.    [provider]  gabapentin (NEURONTIN) 300 MG capsule TAKE TWO CAPSULES BY MOUTH TWICE A DAY 11/07/18   Joaquim Namuncan, Graham S, MD  Insulin Syringe-Needle U-100 (B-D INSULIN SYRINGE 1CC/25GX1") 25G X 1" 1 ML MISC Inject 1 Syringe into the muscle every 14 (fourteen) days. For B12 injection 11/16/18   Joaquim Namuncan, Graham S, MD  lidocaine (XYLOCAINE) 2 % solution Use 5 ml up to 4 times a day as needed. 07/08/18   Joaquim Namuncan, Graham S, MD  metoprolol tartrate (LOPRESSOR) 50 MG tablet TAKE ONE TABLET BY MOUTH TWICE A DAY 12/07/18   Joaquim Namuncan, Graham S, MD  metroNIDAZOLE (FLAGYL) 500 MG tablet Take 500 mg by mouth 3 (three) times daily.    [provider]  ondansetron (ZOFRAN) 4 MG tablet TAKE 1 TO 2 TABLETS BY MOUTH EVERY 8 HOURS AS NEEDED FOR NAUSEA OR VOMITING 07/16/17   Joaquim Namuncan, Graham S, MD  pantoprazole (PROTONIX) 40 MG tablet TAKE ONE TABLET BY MOUTH TWICE A DAY 02/27/19   Joaquim Namuncan, Graham S, MD  promethazine (PHENERGAN) 12.5 MG tablet TAKE ONE TABLET BY MOUTH EVERY 6 HOURS AS NEEDED FOR NAUSEA AND VOMITING 11/02/18   Joaquim Namuncan, Graham S, MD  traMADol (ULTRAM) 50 MG tablet TAKE ONE TABLET BY MOUTH EVERY 8 HOURS AS NEEDED FOR PAIN 11/27/18   Joaquim Namuncan, Graham S, MD  venlafaxine (EFFEXOR) 75 MG tablet Take 1 tablet (75 mg total) by mouth 2 (two) times daily. 03/27/18   Joaquim Namuncan, Graham S, MD    Allergies Latex, Atorvastatin, Dilaudid [hydromorphone hcl], Lexapro [escitalopram oxalate], Lisinopril, Mirtazapine, Nsaids, and Tylenol [acetaminophen]  Family Hx Family History  Problem Relation Age of Onset  . Heart disease Mother         CAD, angioplasty twice with stents  . Cancer Mother [id number]       Breast  . Breast cancer Mother [id number]  . Hypertension Mother   . Peripheral Artery Disease Mother   . Hypertension Father   . Hypothyroidism Father   . Heart disease Father        MI at age 56  . Cancer Father [id number]       H/O renal CA  . Diabetes Sister   . Cancer Sister [id number]       Renal CA  . Factor V Leiden deficiency Brother   . Clotting disorder Brother   . Colon cancer Neg Hx     Social Hx Social History   Tobacco Use  . Smoking status: Never Smoker  . Smokeless tobacco: Never Used  Substance Use  Topics  . Alcohol use: Yes    Alcohol/week: 3.0 standard drinks    Types: 2 Glasses of wine, 1 Cans of beer per week    Comment: cvouple glsses of wine over weekdend and beer monday   . Drug use: No     Review of Systems  Constitutional: Negative for fever, chills. Eyes: Negative for visual changes. ENT: Negative for sore throat. Cardiovascular: Negative for chest pain. Respiratory: Negative for shortness of breath. Gastrointestinal: + for nausea, vomiting, upper abdominal pain.  Genitourinary: Negative for dysuria. Musculoskeletal: Negative for leg swelling. Skin: Negative for rash. Neurological: Negative for for headaches.   Physical Exam  Vital Signs: ED Triage Vitals  Enc Vitals Group     BP 03/06/19 1452 (!) 130/104     Pulse Rate 03/06/19 1452 (!) 110     Resp 03/06/19 1452 20     Temp 03/06/19 1452 98.8 F (37.1 C)     Temp Source 03/06/19 1452 Oral     SpO2 03/06/19 1452 99 %     Weight 03/06/19 1453 145 lb (65.8 kg)     Height 03/06/19 1453 5' (1.524 m)     Head Circumference --      Peak Flow --      Pain Score 03/06/19 1453 8     Pain Loc --      Pain Edu? --      Excl. in GC? --     Constitutional: Alert and oriented.  Head: Normocephalic. Atraumatic. Eyes: Conjunctivae clear. Sclera anicteric. Nose: No congestion. No rhinorrhea. Mouth/Throat: Wearing mask.  Neck: No  stridor.   Cardiovascular: Normal rate, regular rhythm. Extremities well perfused. Respiratory: Normal respiratory effort.  Lungs CTAB. Gastrointestinal: Soft. Non-tender throughout to deep palpation. Non-distended.  Musculoskeletal: No lower extremity edema. No deformities. Neurologic:  Normal speech and language. No gross focal neurologic deficits are appreciated.  Skin: Skin is warm, dry and intact. No rash noted. Psychiatric: Mood and affect are appropriate for situation.  EKG  Personally reviewed.   Rate: 79 Rhythm: sinus Axis: normal Intervals: WNL No acute ischemic changes No STEMI    Radiology  CT A/P: IMPRESSION:  1. Faint bilateral peripheral and subpleural hazy and ground-glass  densities, left greater right most concerning for developing  infiltrate, likely atypical or viral in etiology, including  COVID-19. Clinical correlation is recommended.  2. No acute intra-abdominal or pelvic pathology. No bowel  obstruction or active inflammation.  3. Mild diffuse mesenteric stranding, nonspecific, and possibly  related to prior inflammatory process. Enteritis as the cause of  mesenteric engorgement is less likely. Clinical correlation is  recommended.  4. Fatty liver.  5. 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    Procedures  Procedure(s) performed (including critical care):  Procedures   Initial Impression / Assessment and Plan / ED Course  58 y.o. female who presents to the ED for nausea, vomiting, upper abdominal pain. As above.   Ddx: (alcoholic) gastritis, pancreatitis, viral process. Lower suspicion given reassuring exam, but consider complication from hx of recurrent pancreatitis given her history vs obstruction given decreased BM and intra-abdominal surgical history.  Will obtain labs, imaging, symptom control, reassess  Labs w/o actionable derangements. Lipase WNL. H.Pylori labs sent, she is aware she will receive a  phone call if they are positive. CT scan negative for acute intra-abdominal pathology.  Incidentally, lung bases concerning for possible COVID. Perhaps GI symptoms related to this. We will send out swab.  Urine c/w  mild dehydration (increased specific gravity, though no ketones), receiving IVF, anti-nausea medication. Patient has not had any vomiting in the ED and has tolerated PO after IVF/medication. HR improved with pain control and fluids. UA s/o infection with nitrites, LE, WBC - will give dose of CTX here and plan for course of oral antibiotics.   Otherwise, given negative work up, will proceed with discharge. She is aware that her H. pylori and COVID test are pending, and the need for quarantining/social distancing until her COVID swab results have returned. Otherwise advised continued supportive care at home, PCP follow up, and given return precautions.    Final Clinical Impression(s) / ED Diagnosis  Final diagnoses:  Nausea and vomiting in adult  Urinary tract infection in female  Encounter for laboratory testing for COVID-19 virus       Note:  This document was prepared using Dragon voice recognition software and may include unintentional dictation errors.   Lilia Pro., MD 03/06/19 1946

## 2019-03-06 NOTE — ED Notes (Signed)
Patient transported to CT 

## 2019-03-07 DIAGNOSIS — U071 COVID-19: Secondary | ICD-10-CM

## 2019-03-07 HISTORY — DX: COVID-19: U07.1

## 2019-03-08 ENCOUNTER — Other Ambulatory Visit: Payer: Self-pay | Admitting: Family Medicine

## 2019-03-08 LAB — H PYLORI, IGM, IGG, IGA AB
H Pylori IgG: 0.88 Index Value — ABNORMAL HIGH (ref 0.00–0.79)
H. Pylogi, Iga Abs: 15.8 units — ABNORMAL HIGH (ref 0.0–8.9)
H. Pylogi, Igm Abs: <9 units (ref 0.0–8.9)

## 2019-03-08 LAB — NOVEL CORONAVIRUS, NAA (HOSP ORDER, SEND-OUT TO REF LAB; TAT 18-24 HRS): SARS-CoV-2, NAA: NOT DETECTED

## 2019-03-09 NOTE — Telephone Encounter (Signed)
Electronic refill request. Phenergan Last office visit:   08/09/2018 Last Filled:    30 tablet 2 11/02/2018  Please advise.

## 2019-03-10 NOTE — Telephone Encounter (Signed)
Left detailed message on voicemail asking patient to return call with update.

## 2019-03-10 NOTE — Telephone Encounter (Signed)
Sent. Thanks.   Needs TCM call re: ER visit.

## 2019-03-10 NOTE — Telephone Encounter (Signed)
She doesn't qualify for TCM call without admission.  Please check on patient, offer f/u.  Thanks.

## 2019-03-13 NOTE — Telephone Encounter (Signed)
Left detailed message on voicemail to return call with update and schedule appt if needed.

## 2019-03-14 NOTE — Telephone Encounter (Signed)
Tried to phone patient again with no answer.   Letter mailed.

## 2019-03-20 ENCOUNTER — Other Ambulatory Visit: Payer: Self-pay | Admitting: Family Medicine

## 2019-03-20 MED ORDER — OMEPRAZOLE 40 MG PO CPDR: 40.0000 mg | DELAYED_RELEASE_CAPSULE | Freq: Every day | ORAL | 0 refills | Status: DC

## 2019-03-20 MED ORDER — AMOXICILLIN 500 MG PO CAPS: 1000.0000 mg | ORAL_CAPSULE | Freq: Two times a day (BID) | ORAL | 0 refills | Status: DC

## 2019-03-20 MED ORDER — CLARITHROMYCIN 500 MG PO TABS: 500.0000 mg | ORAL_TABLET | Freq: Two times a day (BID) | ORAL | 0 refills | Status: DC

## 2019-03-23 ENCOUNTER — Other Ambulatory Visit: Payer: Self-pay | Admitting: Family Medicine

## 2019-03-23 NOTE — Telephone Encounter (Signed)
Electronic refill request. Tramadol Last office visit:   08/09/2018 Last Filled:    90 tablet 1 11/27/2018  Please advise.

## 2019-03-24 ENCOUNTER — Telehealth: Payer: Self-pay | Admitting: *Deleted

## 2019-03-24 ENCOUNTER — Encounter: Payer: Self-pay | Admitting: Family Medicine

## 2019-03-24 MED ORDER — FLUCONAZOLE 150 MG PO TABS: 150.0000 mg | ORAL_TABLET | Freq: Once | ORAL | 0 refills | Status: AC

## 2019-03-24 NOTE — Telephone Encounter (Signed)
Patent left a voicemail stating that she is on an antibiotic and now has a yeast infection. Patient wants to know if Dr. Damita Dunnings will send in [place] for her? Pharmacy [facility]

## 2019-03-24 NOTE — Telephone Encounter (Signed)
Patient usually does not answer the phone.  Letter mailed.

## 2019-03-24 NOTE — Telephone Encounter (Signed)
Sent.  Needs follow-up schedule when possible.  Thanks.

## 2019-03-24 NOTE — Telephone Encounter (Signed)
Done. Thanks.

## 2019-03-27 ENCOUNTER — Other Ambulatory Visit: Payer: Self-pay | Admitting: Family Medicine

## 2019-04-04 ENCOUNTER — Other Ambulatory Visit: Payer: Self-pay | Admitting: Family Medicine

## 2019-04-04 NOTE — Telephone Encounter (Signed)
Electronic refill request. Gabapentin Last office visit:   08/09/2018 Last Filled:    120 capsule 4 11/07/2018   Please advise.

## 2019-04-05 ENCOUNTER — Telehealth: Payer: Self-pay

## 2019-04-05 NOTE — Telephone Encounter (Signed)
When was first day of symptoms?  Agree with home care, supportive care with fluids, rest, tylenol, vitamin C.  To seek ER care for worsening shortness of breath even at rest.  Please place on Covid call list to check on her again tomorrow and on Monday (long weekend).   Will send note to Jonelle Sidle to ensure she is on list for evaluation for monoclonal ab treatment eligibility.

## 2019-04-05 NOTE — Telephone Encounter (Signed)
Sent. Thanks.   

## 2019-04-05 NOTE — Telephone Encounter (Signed)
Pt left v/m ;that she tested positive for covid today. Pt is self quarantining at home. I spoke with pt. Pt tested positive for covid by rapid test on 04/03/19 after being tested at [facility] in [place]. Pt is having muscle and joint pain in both hips,severe h/a pain level now is 8. Pt having generalized weakness and pt is extremely tired(fatigued). No cough and no SOB. Pt has h pylori and keeps diarrhea. Pt has lost taste and smell and when tries to eat anything has burning sensation in mouth. No other covid symptoms. No travel and pts father has been in hospital due to covid (pt did not wear a mask when around her father). Pt is not in any distress at this time. Pt is trying to stay hydrated and drinking plenty of water; pt is going to try to rest and will take Tylenol if fever or body aches. UC & ED precautions given and pt voiced understanding. Pt wants to know if there is anything else she needs to do or does pt need any medication. Pt request cb. Sending note to Dr Damita Dunnings and Dr Danise Mina who is in office.

## 2019-04-05 NOTE — Telephone Encounter (Signed)
See below.  I agree with Dr. Darnell Level.  I thank all involved.

## 2019-04-06 ENCOUNTER — Telehealth: Payer: Self-pay | Admitting: Nurse Practitioner

## 2019-04-06 NOTE — Telephone Encounter (Signed)
Called to Discuss with patient about Covid symptoms and the use of bamlanivimab, a monoclonal antibody infusion for those with mild to moderate Covid symptoms and at a high risk of hospitalization.     Pt is qualified for this infusion at the [facility] infusion center due to co-morbid conditions and/or a member of an at-risk group.    Patient Active Problem List   Diagnosis Date Noted  . Headache   . Abdominal pain 08/11/2018  . Cellulitis of antihelix of right ear 05/01/2018  . Colon cancer screening 11/24/2017  . Right knee pain 06/30/2017  . Neck strain 06/17/2017  . Portal vein thrombosis 05/27/2017  . Sepsis ([place]) 05/13/2017  . History of acute pancreatitis 10/26/2015  . Other migraine without status migrainosus, not intractable 09/17/2015  . Sciatica 10/10/2014  . Routine general medical examination at a health care facility 06/05/2014  . Advance care planning 06/05/2014  . Pain in joint, shoulder region 07/19/2013  . History of alcohol abuse 09/13/2012  . Anxiety 04/10/2012  . B12 deficiency 01/31/2010  . Pain in joint 01/31/2010  . Essential hypertension 08/16/2006  . ASTHMA, EXTRINSIC NOS 08/16/2006  . ESOPHAGITIS 08/16/2006  . GERD 08/16/2006      Unable to reach pt. Will try to call back again later today.

## 2019-04-06 NOTE — Telephone Encounter (Signed)
Patient advised.

## 2019-04-06 NOTE — Telephone Encounter (Signed)
Noted.  Spoke with pt asking for start of sxs.  Says they started 04/03/19.

## 2019-04-07 ENCOUNTER — Other Ambulatory Visit: Payer: Self-pay | Admitting: Nurse Practitioner

## 2019-04-07 NOTE — Progress Notes (Signed)
  I connected by phone with Abigail [name] on 04/07/2019 at 9:10 AM to discuss the potential use of an new treatment for mild to moderate COVID-19 viral infection in non-hospitalized patients.  This patient is a 59 y.o. female that meets the FDA criteria for Emergency Use Authorization of bamlanivimab or casirivimab\imdevimab.  Has a (+) direct SARS-CoV-2 viral test result  Has mild or moderate COVID-19   Is ? 57 years of age and weighs ? 40 kg  Is NOT hospitalized due to COVID-19  Is NOT requiring oxygen therapy or requiring an increase in baseline oxygen flow rate due to COVID-19  Is within 10 days of symptom onset  Has at least one of the high risk factor(s) for progression to severe COVID-19 and/or hospitalization as defined in EUA.  Specific high risk criteria : Hypertension   I have spoken and communicated the following to the patient or parent/caregiver:  1. FDA has authorized the emergency use of bamlanivimab and casirivimab\imdevimab for the treatment of mild to moderate COVID-19 in adults and pediatric patients with positive results of direct SARS-CoV-2 viral testing who are 18 years of age and older weighing at least 40 kg, and who are at high risk for progressing to severe COVID-19 and/or hospitalization.  2. The significant known and potential risks and benefits of bamlanivimab and casirivimab\imdevimab, and the extent to which such potential risks and benefits are unknown.  3. Information on available alternative treatments and the risks and benefits of those alternatives, including clinical trials.  4. Patients treated with bamlanivimab and casirivimab\imdevimab should continue to self-isolate and use infection control measures (e.g., wear mask, isolate, social distance, avoid sharing personal items, clean and disinfect "high touch" surfaces, and frequent handwashing) according to CDC guidelines.   5. The patient or parent/caregiver has the option to accept or refuse  bamlanivimab or casirivimab\imdevimab .  After reviewing this information with the patient, The patient agreed to proceed with receiving the bamlanimivab infusion and will be provided a copy of the Fact sheet prior to receiving the infusion.[name] 04/07/2019 9:10 AM

## 2019-04-10 NOTE — Telephone Encounter (Signed)
Patient says her main symptoms are the aches and pains that seem to have gotten worse.  Maybe some fevers at night because she wakes up sweaty but no prolonged fevers.  Patient is scheduled for the infusion tomorrow.

## 2019-04-10 NOTE — Telephone Encounter (Signed)
Noted, thanks.  I'll await the update from the infusion center.

## 2019-04-11 ENCOUNTER — Ambulatory Visit (HOSPITAL_COMMUNITY)
Admission: RE | Admit: 2019-04-11 | Discharge: 2019-04-11 | Disposition: A | Discharge status: Home / Self Care | Payer: 59 | Source: Ambulatory Visit | Attending: Pulmonary Disease | Admitting: Pulmonary Disease

## 2019-04-11 DIAGNOSIS — U071 COVID-19: Secondary | ICD-10-CM | POA: Insufficient documentation

## 2019-04-11 DIAGNOSIS — Z23 Encounter for immunization: Secondary | ICD-10-CM | POA: Diagnosis not present

## 2019-04-11 MED ORDER — METHYLPREDNISOLONE SODIUM SUCC 125 MG IJ SOLR: 125.0000 mg | Freq: Once | INTRAMUSCULAR | Status: DC | PRN

## 2019-04-11 MED ORDER — SODIUM CHLORIDE 0.9 % IV SOLN
700.0000 mg | Freq: Once | INTRAVENOUS | Status: AC
  Administered 2019-04-11: 700 mg via INTRAVENOUS
  Filled 2019-04-11: qty 20

## 2019-04-11 MED ORDER — EPINEPHRINE 0.3 MG/0.3ML IJ SOAJ: 0.3000 mg | Freq: Once | INTRAMUSCULAR | Status: DC | PRN

## 2019-04-11 MED ORDER — DIPHENHYDRAMINE HCL 50 MG/ML IJ SOLN: 50.0000 mg | Freq: Once | INTRAMUSCULAR | Status: DC | PRN

## 2019-04-11 MED ORDER — SODIUM CHLORIDE 0.9 % IV SOLN
INTRAVENOUS | Status: DC | PRN
  Administered 2019-04-11: 15:00:00 250 mL via INTRAVENOUS

## 2019-04-11 MED ORDER — ALBUTEROL SULFATE HFA 108 (90 BASE) MCG/ACT IN AERS: 2.0000 | INHALATION_SPRAY | Freq: Once | RESPIRATORY_TRACT | Status: DC | PRN

## 2019-04-11 MED ORDER — FAMOTIDINE IN NACL 20-0.9 MG/50ML-% IV SOLN: 20.0000 mg | Freq: Once | INTRAVENOUS | Status: DC | PRN

## 2019-04-11 NOTE — Progress Notes (Signed)
  Diagnosis: COVID-19  Physician: Dr. Wright  Procedure: Covid Infusion Clinic Med: bamlanivimab infusion - Provided patient with bamlanimivab fact sheet for patients, parents and caregivers prior to infusion.  Complications: No immediate complications noted.  Discharge: Discharged home   Abigail [name] L 04/11/2019    

## 2019-04-11 NOTE — Discharge Instructions (Signed)

## 2019-04-12 ENCOUNTER — Encounter: Payer: Self-pay | Admitting: Family Medicine

## 2019-04-13 ENCOUNTER — Other Ambulatory Visit: Payer: Self-pay | Admitting: Family Medicine

## 2019-05-10 ENCOUNTER — Other Ambulatory Visit: Payer: Self-pay | Admitting: Family Medicine

## 2019-05-12 ENCOUNTER — Ambulatory Visit (INDEPENDENT_AMBULATORY_CARE_PROVIDER_SITE_OTHER): Payer: 59 | Admitting: Family Medicine

## 2019-05-12 ENCOUNTER — Ambulatory Visit (INDEPENDENT_AMBULATORY_CARE_PROVIDER_SITE_OTHER)
Admission: RE | Admit: 2019-05-12 | Discharge: 2019-05-12 | Disposition: A | Discharge status: Home / Self Care | Payer: 59 | Source: Ambulatory Visit | Attending: Family Medicine | Admitting: Family Medicine

## 2019-05-12 ENCOUNTER — Encounter: Payer: Self-pay | Admitting: Family Medicine

## 2019-05-12 ENCOUNTER — Other Ambulatory Visit: Payer: Self-pay

## 2019-05-12 VITALS — BP 104/70 | HR 74 | Temp 96.0°F | Ht 60.0 in | Wt 148.1 lb

## 2019-05-12 DIAGNOSIS — R609 Edema, unspecified: Secondary | ICD-10-CM | POA: Diagnosis not present

## 2019-05-12 DIAGNOSIS — R1030 Lower abdominal pain, unspecified: Secondary | ICD-10-CM | POA: Diagnosis not present

## 2019-05-12 DIAGNOSIS — R0602 Shortness of breath: Secondary | ICD-10-CM

## 2019-05-12 DIAGNOSIS — R195 Other fecal abnormalities: Secondary | ICD-10-CM

## 2019-05-12 LAB — COMPREHENSIVE METABOLIC PANEL
ALT: 20 U/L (ref 0–35)
AST: 43 U/L — ABNORMAL HIGH (ref 0–37)
Albumin: 1.8 g/dL — ABNORMAL LOW (ref 3.5–5.2)
Alkaline Phosphatase: 99 U/L (ref 39–117)
BUN: 8 mg/dL (ref 6–23)
CO2: 28 mEq/L (ref 19–32)
Calcium: 7.5 mg/dL — ABNORMAL LOW (ref 8.4–10.5)
Chloride: 106 mEq/L (ref 96–112)
Creatinine, Ser: 0.49 mg/dL (ref 0.40–1.20)
GFR: 130.54 mL/min (ref >60.00)
Glucose, Bld: 70 mg/dL (ref 70–99)
Potassium: 4 mEq/L (ref 3.5–5.1)
Sodium: 138 mEq/L (ref 135–145)
Total Bilirubin: 0.5 mg/dL (ref 0.2–1.2)
Total Protein: 5.3 g/dL — ABNORMAL LOW (ref 6.0–8.3)

## 2019-05-12 LAB — BRAIN NATRIURETIC PEPTIDE: Pro B Natriuretic peptide (BNP): 203 pg/mL — ABNORMAL HIGH (ref 0.0–100.0)

## 2019-05-12 LAB — CBC WITH DIFFERENTIAL/PLATELET
Basophils Absolute: 0 10*3/uL (ref 0.0–0.1)
Basophils Relative: 0.2 % (ref 0.0–3.0)
Eosinophils Absolute: 0 10*3/uL (ref 0.0–0.7)
Eosinophils Relative: 0 % (ref 0.0–5.0)
HCT: 32 % — ABNORMAL LOW (ref 36.0–46.0)
Hemoglobin: 10.5 g/dL — ABNORMAL LOW (ref 12.0–15.0)
Lymphocytes Relative: 35.1 % (ref 12.0–46.0)
Lymphs Abs: 1.6 10*3/uL (ref 0.7–4.0)
MCHC: 32.7 g/dL (ref 30.0–36.0)
MCV: 101.7 fl — ABNORMAL HIGH (ref 78.0–100.0)
Monocytes Absolute: 0.6 10*3/uL (ref 0.1–1.0)
Monocytes Relative: 12.3 % — ABNORMAL HIGH (ref 3.0–12.0)
Neutro Abs: 2.5 10*3/uL (ref 1.4–7.7)
Neutrophils Relative %: 52.4 % (ref 43.0–77.0)
Platelets: 228 10*3/uL (ref 150.0–400.0)
RBC: 3.15 Mil/uL — ABNORMAL LOW (ref 3.87–5.11)
RDW: 15.7 % — ABNORMAL HIGH (ref 11.5–15.5)
WBC: 4.7 10*3/uL (ref 4.0–10.5)

## 2019-05-12 LAB — POC URINALSYSI DIPSTICK (AUTOMATED)
Bilirubin, UA: NEGATIVE
Glucose, UA: NEGATIVE
Ketones, UA: NEGATIVE
Nitrite, UA: POSITIVE
Protein, UA: NEGATIVE
Spec Grav, UA: 1.015 (ref 1.010–1.025)
Urobilinogen, UA: 0.2 E.U./dL
pH, UA: 6 (ref 5.0–8.0)

## 2019-05-12 MED ORDER — CEPHALEXIN 500 MG PO CAPS: 500.0000 mg | ORAL_CAPSULE | Freq: Two times a day (BID) | ORAL | 0 refills | Status: DC

## 2019-05-12 MED ORDER — ONDANSETRON 4 MG PO TBDP: 4.0000 mg | ORAL_TABLET | Freq: Three times a day (TID) | ORAL | 1 refills | Status: DC | PRN

## 2019-05-12 NOTE — Progress Notes (Signed)
This visit occurred during the SARS-CoV-2 public health emergency.  Safety protocols were in place, including screening questions prior to the visit, additional usage of staff PPE, and extensive cleaning of exam room while observing appropriate contact time as indicated for disinfecting solutions.  Per patient, recent inc in weight attributed to fluid after prev weight loss.  Weight loss started in the midst of H pylori.  Swelling started about 1 week ago.  No CP.  She feels SOB due to diffuse weakness with exertion, ie dec in exercise capacity.  No vomiting. No diarrhea now, had some about 1 week ago.  Sleeping on 2-3 pillows, at baseline.  This isn't new.  No jaundice.  She has baseline abd discomfort.  She has darker urine recently.  Drinking water frequently.    Prev covid dx d/w pt. She had infusion done.  Her father died from Covid last month.    She has had greasy stools recently.  No blood in stool. Nausea had better response with ODT zofran.   Meds, vitals, and allergies reviewed.   ROS: Per HPI unless specifically indicated in ROS section   nad ncat Neck supple, no la rrr ctab abd soft.  Lower abd slightly ttp. No rebound.  No jaundice.   2+ BLE edema. Skin well perfused.

## 2019-05-12 NOTE — Patient Instructions (Addendum)
Drink plenty of water and start the antibiotics today.  We'll contact you with your lab report.  Take care.    Go by the lab and pick up a stool kit.   Take care.  Glad to see you.

## 2019-05-13 ENCOUNTER — Other Ambulatory Visit: Payer: Self-pay | Admitting: Family Medicine

## 2019-05-14 ENCOUNTER — Other Ambulatory Visit: Payer: Self-pay | Admitting: Family Medicine

## 2019-05-14 DIAGNOSIS — D649 Anemia, unspecified: Secondary | ICD-10-CM

## 2019-05-14 DIAGNOSIS — R609 Edema, unspecified: Secondary | ICD-10-CM | POA: Insufficient documentation

## 2019-05-14 LAB — URINE CULTURE
MICRO NUMBER:: 10121611
SPECIMEN QUALITY:: ADEQUATE

## 2019-05-14 NOTE — Assessment & Plan Note (Addendum)
Symmetric bilateral lower extremity edema in the setting of weight loss with greasy stools.  Check labs today.  The concern is for swelling related to weight loss possibly due to pancreatic insufficiency.  Check routine serum labs today along with fecal pancreatic elastase.  Rationale for work-up discussed with patient.  She agrees to plan.  At this point still okay for outpatient follow-up.  Her shortness of breath on exertion may be related to a malabsorptive process and relative deconditioning.  At least 30 minutes were devoted to patient care in this encounter (this can potentially include time spent reviewing the patient's file/history, interviewing and examining the patient, counseling/reviewing plan with patient, ordering referrals, ordering tests, reviewing relevant laboratory or x-ray data, and documenting the encounter).

## 2019-05-14 NOTE — Assessment & Plan Note (Signed)
Discussed with patient.  Given her urinalysis results, she could have a UTI.  Start Keflex, check urine culture.  Continue drinking plenty of fluid.  Still okay for outpatient follow-up.  She agrees.

## 2019-05-18 ENCOUNTER — Other Ambulatory Visit: Payer: Self-pay

## 2019-05-18 ENCOUNTER — Ambulatory Visit (INDEPENDENT_AMBULATORY_CARE_PROVIDER_SITE_OTHER)
Admission: RE | Admit: 2019-05-18 | Discharge: 2019-05-18 | Disposition: A | Discharge status: Home / Self Care | Payer: 59 | Source: Ambulatory Visit | Attending: Family Medicine | Admitting: Family Medicine

## 2019-05-18 ENCOUNTER — Ambulatory Visit (INDEPENDENT_AMBULATORY_CARE_PROVIDER_SITE_OTHER): Payer: 59 | Admitting: Family Medicine

## 2019-05-18 VITALS — BP 148/82 | HR 98 | Temp 97.9°F | Ht 60.0 in | Wt 154.0 lb

## 2019-05-18 DIAGNOSIS — M79671 Pain in right foot: Secondary | ICD-10-CM

## 2019-05-18 NOTE — Patient Instructions (Addendum)
Likely an ankle a sprain - ice - Ok to try 2 Tramadol, if not helping reach out to Dr. Para March - rest - can use brace - hard sole shoe - weight bearing as tolerated  - reach out if you want to try Physical Therapy if not improving

## 2019-05-18 NOTE — Progress Notes (Signed)
   Subjective:     Abigail [name] is a 59 y.o. female presenting for Foot Pain (right foot. yesterday morning slipped on the towel and landed on the right foot.)     HPI  #Right foot pain - has b/l swelling - injury - fell on the right foot on the dorsal surface with the foot in plantar flexion - she also hit her head - did walk on it, and went to work hobbling - not as bad today as it was yesterday - prior injury - has sprained this foot in the past - pain: ankle on the lateral edge -    Review of Systems   Social History   Tobacco Use  Smoking Status Never Smoker  Smokeless Tobacco Never Used        Objective:    BP Readings from Last 3 Encounters:  05/18/19 (!) 148/82  05/12/19 104/70  04/11/19 123/78   Wt Readings from Last 3 Encounters:  05/18/19 154 lb (69.9 kg)  05/12/19 148 lb 2 oz (67.2 kg)  03/06/19 145 lb (65.8 kg)    BP (!) 148/82   Pulse 98   Temp 97.9 F (36.6 C)   Ht 5' (1.524 m)   Wt 154 lb (69.9 kg)   LMP  (LMP Unknown) Comment: tubal  SpO2 97%   BMI 30.08 kg/m    Physical Exam Musculoskeletal:     Right lower leg: Edema (3+ foot) present.     Left lower leg: Edema (2+) present.     Comments: Right Foot:  Inspection: Bruising along digits 2-5, also along the lateral ankle Palpation: TTP along the medial malleolus, and 2nd/3rd forefoot area ROM: normal Strength: deferred    XR: no fracture       Assessment & Plan:   Problem List Items Addressed This Visit      Other   Acute foot pain, right - Primary    XR without sign of fracture. Discussed likely a sprain, RICE treatment. PT or return if not improving      Relevant Orders   DG Foot Complete Right (Completed)   DG Ankle Complete Right (Completed)       Return in about 2 weeks (around 06/01/2019), or if symptoms worsen or fail to improve.  Lynnda Child, MD

## 2019-05-19 DIAGNOSIS — M79671 Pain in right foot: Secondary | ICD-10-CM | POA: Insufficient documentation

## 2019-05-19 LAB — PANCREATIC ELASTASE, FECAL: Pancreatic Elastase-1, Stool: <15 mcg/g — ABNORMAL LOW

## 2019-05-19 NOTE — Assessment & Plan Note (Signed)
XR without sign of fracture. Discussed likely a sprain, RICE treatment. PT or return if not improving

## 2019-05-22 ENCOUNTER — Encounter: Payer: Self-pay | Admitting: Family Medicine

## 2019-05-22 ENCOUNTER — Other Ambulatory Visit: Payer: Self-pay | Admitting: Family Medicine

## 2019-05-22 DIAGNOSIS — K8689 Other specified diseases of pancreas: Secondary | ICD-10-CM

## 2019-05-22 MED ORDER — PANCRELIPASE (LIP-PROT-AMYL) 36000-114000 UNITS PO CPEP: 36000.0000 [IU] | ORAL_CAPSULE | Freq: Three times a day (TID) | ORAL | 1 refills | Status: DC

## 2019-05-29 ENCOUNTER — Other Ambulatory Visit: Payer: Self-pay | Admitting: Family Medicine

## 2019-06-06 ENCOUNTER — Other Ambulatory Visit: Payer: Self-pay | Admitting: Family Medicine

## 2019-06-07 ENCOUNTER — Other Ambulatory Visit: Payer: Self-pay | Admitting: Family Medicine

## 2019-06-08 ENCOUNTER — Ambulatory Visit (INDEPENDENT_AMBULATORY_CARE_PROVIDER_SITE_OTHER): Payer: 59 | Admitting: Family Medicine

## 2019-06-08 ENCOUNTER — Emergency Department (HOSPITAL_COMMUNITY): Payer: 59

## 2019-06-08 ENCOUNTER — Inpatient Hospital Stay (HOSPITAL_COMMUNITY)
Admission: EM | Admit: 2019-06-08 | Discharge: 2019-06-11 | DRG: 433 | Disposition: A | Discharge status: Home Health | Payer: 59 | Source: Ambulatory Visit | Attending: Internal Medicine | Admitting: Internal Medicine

## 2019-06-08 ENCOUNTER — Ambulatory Visit: Payer: 59 | Admitting: Family Medicine

## 2019-06-08 ENCOUNTER — Other Ambulatory Visit: Payer: Self-pay

## 2019-06-08 ENCOUNTER — Encounter (HOSPITAL_COMMUNITY): Payer: Self-pay

## 2019-06-08 ENCOUNTER — Encounter: Payer: Self-pay | Admitting: Family Medicine

## 2019-06-08 DIAGNOSIS — D696 Thrombocytopenia, unspecified: Secondary | ICD-10-CM | POA: Diagnosis present

## 2019-06-08 DIAGNOSIS — K769 Liver disease, unspecified: Secondary | ICD-10-CM

## 2019-06-08 DIAGNOSIS — Z832 Family history of diseases of the blood and blood-forming organs and certain disorders involving the immune mechanism: Secondary | ICD-10-CM | POA: Diagnosis not present

## 2019-06-08 DIAGNOSIS — R1011 Right upper quadrant pain: Secondary | ICD-10-CM

## 2019-06-08 DIAGNOSIS — Z8616 Personal history of COVID-19: Secondary | ICD-10-CM

## 2019-06-08 DIAGNOSIS — Z86718 Personal history of other venous thrombosis and embolism: Secondary | ICD-10-CM

## 2019-06-08 DIAGNOSIS — E875 Hyperkalemia: Secondary | ICD-10-CM | POA: Diagnosis present

## 2019-06-08 DIAGNOSIS — R0602 Shortness of breath: Secondary | ICD-10-CM | POA: Diagnosis not present

## 2019-06-08 DIAGNOSIS — Z79899 Other long term (current) drug therapy: Secondary | ICD-10-CM | POA: Diagnosis not present

## 2019-06-08 DIAGNOSIS — E878 Other disorders of electrolyte and fluid balance, not elsewhere classified: Secondary | ICD-10-CM | POA: Diagnosis not present

## 2019-06-08 DIAGNOSIS — Z8249 Family history of ischemic heart disease and other diseases of the circulatory system: Secondary | ICD-10-CM

## 2019-06-08 DIAGNOSIS — E871 Hypo-osmolality and hyponatremia: Secondary | ICD-10-CM | POA: Diagnosis present

## 2019-06-08 DIAGNOSIS — R627 Adult failure to thrive: Secondary | ICD-10-CM

## 2019-06-08 DIAGNOSIS — I10 Essential (primary) hypertension: Secondary | ICD-10-CM | POA: Diagnosis present

## 2019-06-08 DIAGNOSIS — D7589 Other specified diseases of blood and blood-forming organs: Secondary | ICD-10-CM | POA: Diagnosis present

## 2019-06-08 DIAGNOSIS — Z8051 Family history of malignant neoplasm of kidney: Secondary | ICD-10-CM

## 2019-06-08 DIAGNOSIS — R609 Edema, unspecified: Secondary | ICD-10-CM | POA: Diagnosis not present

## 2019-06-08 DIAGNOSIS — Z833 Family history of diabetes mellitus: Secondary | ICD-10-CM | POA: Diagnosis not present

## 2019-06-08 DIAGNOSIS — K861 Other chronic pancreatitis: Secondary | ICD-10-CM | POA: Diagnosis present

## 2019-06-08 DIAGNOSIS — Z803 Family history of malignant neoplasm of breast: Secondary | ICD-10-CM | POA: Diagnosis not present

## 2019-06-08 DIAGNOSIS — N179 Acute kidney failure, unspecified: Secondary | ICD-10-CM | POA: Diagnosis present

## 2019-06-08 DIAGNOSIS — K219 Gastro-esophageal reflux disease without esophagitis: Secondary | ICD-10-CM | POA: Diagnosis present

## 2019-06-08 DIAGNOSIS — Z9884 Bariatric surgery status: Secondary | ICD-10-CM | POA: Diagnosis not present

## 2019-06-08 DIAGNOSIS — I82431 Acute embolism and thrombosis of right popliteal vein: Secondary | ICD-10-CM | POA: Diagnosis present

## 2019-06-08 DIAGNOSIS — K7031 Alcoholic cirrhosis of liver with ascites: Secondary | ICD-10-CM | POA: Diagnosis not present

## 2019-06-08 DIAGNOSIS — I82412 Acute embolism and thrombosis of left femoral vein: Secondary | ICD-10-CM | POA: Diagnosis present

## 2019-06-08 DIAGNOSIS — R791 Abnormal coagulation profile: Secondary | ICD-10-CM

## 2019-06-08 DIAGNOSIS — F1020 Alcohol dependence, uncomplicated: Secondary | ICD-10-CM | POA: Diagnosis present

## 2019-06-08 DIAGNOSIS — E8809 Other disorders of plasma-protein metabolism, not elsewhere classified: Secondary | ICD-10-CM | POA: Diagnosis present

## 2019-06-08 DIAGNOSIS — R601 Generalized edema: Secondary | ICD-10-CM | POA: Diagnosis present

## 2019-06-08 LAB — CBC
HCT: 37.7 % (ref 36.0–46.0)
HCT: 35.2 % — ABNORMAL LOW (ref 36.0–46.0)
Hemoglobin: 12.2 g/dL (ref 12.0–15.0)
Hemoglobin: 11.7 g/dL — ABNORMAL LOW (ref 12.0–15.0)
MCH: 33.2 pg (ref 26.0–34.0)
MCH: 32.8 pg (ref 26.0–34.0)
MCHC: 32.4 g/dL (ref 30.0–36.0)
MCHC: 33.2 g/dL (ref 30.0–36.0)
MCV: 102.4 fL — ABNORMAL HIGH (ref 80.0–100.0)
MCV: 98.6 fL (ref 80.0–100.0)
Platelets: 113 10*3/uL — ABNORMAL LOW (ref 150–400)
Platelets: 184 10*3/uL (ref 150–400)
RBC: 3.68 MIL/uL — ABNORMAL LOW (ref 3.87–5.11)
RBC: 3.57 MIL/uL — ABNORMAL LOW (ref 3.87–5.11)
RDW: 14.4 % (ref 11.5–15.5)
RDW: 14.4 % (ref 11.5–15.5)
WBC: 8.5 10*3/uL (ref 4.0–10.5)
WBC: 9.5 10*3/uL (ref 4.0–10.5)
nRBC: 0 % (ref 0.0–0.2)
nRBC: 0.2 % (ref 0.0–0.2)

## 2019-06-08 LAB — MAGNESIUM: Magnesium: 1.8 mg/dL (ref 1.7–2.4)

## 2019-06-08 LAB — COMPREHENSIVE METABOLIC PANEL
ALT: 29 U/L (ref 0–44)
ALT: 25 U/L (ref 0–44)
AST: 65 U/L — ABNORMAL HIGH (ref 15–41)
AST: 47 U/L — ABNORMAL HIGH (ref 15–41)
Albumin: 1.5 g/dL — ABNORMAL LOW (ref 3.5–5.0)
Albumin: 1.5 g/dL — ABNORMAL LOW (ref 3.5–5.0)
Alkaline Phosphatase: 131 U/L — ABNORMAL HIGH (ref 38–126)
Alkaline Phosphatase: 124 U/L (ref 38–126)
Anion gap: 12 (ref 5–15)
Anion gap: 9 (ref 5–15)
BUN: 8 mg/dL (ref 6–20)
BUN: 8 mg/dL (ref 6–20)
CO2: 19 mmol/L — ABNORMAL LOW (ref 22–32)
CO2: 23 mmol/L (ref 22–32)
Calcium: 7.7 mg/dL — ABNORMAL LOW (ref 8.9–10.3)
Calcium: 7.8 mg/dL — ABNORMAL LOW (ref 8.9–10.3)
Chloride: 96 mmol/L — ABNORMAL LOW (ref 98–111)
Chloride: 95 mmol/L — ABNORMAL LOW (ref 98–111)
Creatinine, Ser: 1.07 mg/dL — ABNORMAL HIGH (ref 0.44–1.00)
Creatinine, Ser: 0.99 mg/dL (ref 0.44–1.00)
GFR calc Af Amer: >60 mL/min (ref >60)
GFR calc Af Amer: >60 mL/min (ref >60)
GFR calc non Af Amer: 58 mL/min — ABNORMAL LOW (ref >60)
GFR calc non Af Amer: >60 mL/min (ref >60)
Glucose, Bld: 91 mg/dL (ref 70–99)
Glucose, Bld: 90 mg/dL (ref 70–99)
Potassium: 5.2 mmol/L — ABNORMAL HIGH (ref 3.5–5.1)
Potassium: 4.8 mmol/L (ref 3.5–5.1)
Sodium: 127 mmol/L — ABNORMAL LOW (ref 135–145)
Sodium: 127 mmol/L — ABNORMAL LOW (ref 135–145)
Total Bilirubin: 1.9 mg/dL — ABNORMAL HIGH (ref 0.3–1.2)
Total Bilirubin: 2.2 mg/dL — ABNORMAL HIGH (ref 0.3–1.2)
Total Protein: 6.1 g/dL — ABNORMAL LOW (ref 6.5–8.1)
Total Protein: 6 g/dL — ABNORMAL LOW (ref 6.5–8.1)

## 2019-06-08 LAB — LIPASE, BLOOD: Lipase: 14 U/L (ref 11–51)

## 2019-06-08 LAB — HEPATITIS PANEL, ACUTE
HCV Ab: NONREACTIVE
Hep A IgM: NONREACTIVE
Hep B C IgM: NONREACTIVE
Hepatitis B Surface Ag: NONREACTIVE

## 2019-06-08 LAB — PHOSPHORUS: Phosphorus: 3.2 mg/dL (ref 2.5–4.6)

## 2019-06-08 LAB — HIV ANTIBODY (ROUTINE TESTING W REFLEX): HIV Screen 4th Generation wRfx: NONREACTIVE

## 2019-06-08 LAB — PROTIME-INR
INR: 1.7 — ABNORMAL HIGH (ref 0.8–1.2)
Prothrombin Time: 19.6 seconds — ABNORMAL HIGH (ref 11.4–15.2)

## 2019-06-08 LAB — OSMOLALITY: Osmolality: 272 mOsm/kg — ABNORMAL LOW (ref 275–295)

## 2019-06-08 LAB — AMMONIA: Ammonia: 35 umol/L (ref 9–35)

## 2019-06-08 MED ORDER — ENOXAPARIN SODIUM 40 MG/0.4ML ~~LOC~~ SOLN: 40.0000 mg | SUBCUTANEOUS | Status: DC

## 2019-06-08 MED ORDER — ADULT MULTIVITAMIN W/MINERALS CH
1.0000 | ORAL_TABLET | Freq: Every day | ORAL | Status: DC
  Administered 2019-06-08 – 2019-06-11 (×4): 1 via ORAL
  Filled 2019-06-08 (×4): qty 1

## 2019-06-08 MED ORDER — SENNA 8.6 MG PO TABS
1.0000 | ORAL_TABLET | Freq: Two times a day (BID) | ORAL | Status: DC
  Administered 2019-06-08 – 2019-06-11 (×5): 8.6 mg via ORAL
  Filled 2019-06-08 (×6): qty 1

## 2019-06-08 MED ORDER — MAGNESIUM HYDROXIDE 400 MG/5ML PO SUSP
30.0000 mL | Freq: Every day | ORAL | Status: DC | PRN
  Filled 2019-06-08: qty 30

## 2019-06-08 MED ORDER — THIAMINE HCL 100 MG/ML IJ SOLN: 100.0000 mg | Freq: Every day | INTRAMUSCULAR | Status: DC

## 2019-06-08 MED ORDER — SODIUM CHLORIDE 0.9% FLUSH
3.0000 mL | Freq: Two times a day (BID) | INTRAVENOUS | Status: DC
  Administered 2019-06-08 – 2019-06-10 (×5): 3 mL via INTRAVENOUS

## 2019-06-08 MED ORDER — LORAZEPAM 1 MG PO TABS: 1.0000 mg | ORAL_TABLET | ORAL | Status: DC | PRN

## 2019-06-08 MED ORDER — ONDANSETRON HCL 4 MG/2ML IJ SOLN
4.0000 mg | Freq: Four times a day (QID) | INTRAMUSCULAR | Status: DC | PRN
  Administered 2019-06-09 – 2019-06-10 (×4): 4 mg via INTRAVENOUS
  Filled 2019-06-08 (×4): qty 2

## 2019-06-08 MED ORDER — LORAZEPAM 2 MG/ML IJ SOLN: 1.0000 mg | INTRAMUSCULAR | Status: DC | PRN

## 2019-06-08 MED ORDER — FUROSEMIDE 10 MG/ML IJ SOLN
20.0000 mg | Freq: Once | INTRAMUSCULAR | Status: AC
  Administered 2019-06-08: 20 mg via INTRAVENOUS
  Filled 2019-06-08: qty 2

## 2019-06-08 MED ORDER — SORBITOL 70 % SOLN
30.0000 mL | Freq: Every day | Status: DC | PRN
  Filled 2019-06-08: qty 30

## 2019-06-08 MED ORDER — FOLIC ACID 1 MG PO TABS
1.0000 mg | ORAL_TABLET | Freq: Every day | ORAL | Status: DC
  Administered 2019-06-09 – 2019-06-11 (×3): 1 mg via ORAL
  Filled 2019-06-08 (×3): qty 1

## 2019-06-08 MED ORDER — ONDANSETRON HCL 4 MG PO TABS: 4.0000 mg | ORAL_TABLET | Freq: Four times a day (QID) | ORAL | Status: DC | PRN

## 2019-06-08 MED ORDER — FLEET ENEMA 7-19 GM/118ML RE ENEM: 1.0000 | ENEMA | Freq: Once | RECTAL | Status: DC | PRN

## 2019-06-08 MED ORDER — THIAMINE HCL 100 MG PO TABS
100.0000 mg | ORAL_TABLET | Freq: Every day | ORAL | Status: DC
  Administered 2019-06-08 – 2019-06-11 (×4): 100 mg via ORAL
  Filled 2019-06-08 (×4): qty 1

## 2019-06-08 NOTE — ED Triage Notes (Signed)
Pt from PCP office with ems for worsening SOB since testing positive for COVID in January. Pt also has hx of pancreatitis. Reports bilateral leg swelling that is spreading to her abd that started about 1 month ago. resp e.u at this time.

## 2019-06-08 NOTE — ED Provider Notes (Signed)
[facility] EMERGENCY DEPARTMENT Provider Note   CSN: [id number] Arrival date & time: 06/08/19  1056     History Chief Complaint  Patient presents with  . Shortness of Breath  . Leg Swelling    Abigail [name] is a 59 y.o. female female who presents emergency department with generalized weakness and fatigue.  She is a past medical history of alcohol misuse disorder, recurrent pancreatitis, previous portal vein thrombosis, reflux, cholecystitis.  She is status post Roux-en-Y gastric bypass, cholecystectomy and appendectomy.  Patient was diagnosed with coronavirus back in January.  She states that since that time she has been declining in health.  She says that she has about 40 pounds of fluid weight gain from her abdomen down to her feet.  She feels extremely exhausted and weak.  She says that she is having difficulty even walking around her house secondary to the heaviness in her legs and likely she states some deconditioning.  She does feel short of breath but denies exertional dyspnea, orthopnea, PND states mostly that she is feeling extremely weak.  She has had severe difficulty sleeping and has not had return of her sense of taste or smell with severely decreased appetite and p.o. intake of both fluids and food.  He denies nausea or vomiting.  She saw her PCP today who sent her in for further evaluation.  Denies any abdominal pain, nausea, vomiting, you urinary symptoms, fever or chills.  She denies cough, hemoptysis.    Marland Kitchen HPI     Past Medical History:  Diagnosis Date  . Alcoholism (HCC)   . Cholecystitis   . COVID-19 virus infection 03/2019  . GERD (gastroesophageal reflux disease)   . Headache    improved with gabapentin   . Hypertension   . Pancreatic insufficiency   . Pancreatitis 10/2015   2013 alcohol and/or gallstone  . Photosensitivity    (No formal dx of lupus) prev eval Dr. Gavin Potters in [place]  . Portal vein thrombosis 2019    Patient Active  Problem List   Diagnosis Date Noted  . Electrolyte abnormality 06/08/2019  . Pancreatic insufficiency 05/22/2019  . Acute foot pain, right 05/19/2019  . Edema 05/14/2019  . Headache   . Lower abdominal pain 08/11/2018  . Colon cancer screening 11/24/2017  . Right knee pain 06/30/2017  . Neck strain 06/17/2017  . Portal vein thrombosis 05/27/2017  . Sepsis (HCC) 05/13/2017  . History of acute pancreatitis 10/26/2015  . Other migraine without status migrainosus, not intractable 09/17/2015  . Sciatica 10/10/2014  . Routine general medical examination at a health care facility 06/05/2014  . Advance care planning 06/05/2014  . Pain in joint, shoulder region 07/19/2013  . History of alcohol abuse 09/13/2012  . Anxiety 04/10/2012  . B12 deficiency 01/31/2010  . Pain in joint 01/31/2010  . Essential hypertension 08/16/2006  . ASTHMA, EXTRINSIC NOS 08/16/2006  . ESOPHAGITIS 08/16/2006  . GERD 08/16/2006    Past Surgical History:  Procedure Laterality Date  . APPENDECTOMY     As a child  . CAT scan  11/00   wnl  . CHOLECYSTECTOMY  04/20/2012   Procedure: LAPAROSCOPIC CHOLECYSTECTOMY WITH INTRAOPERATIVE CHOLANGIOGRAM;  Surgeon: Robyne Askew, MD;  Location: [facility] OR;  Service: General;  Laterality: N/A;  . COLONOSCOPY  2005  . DOBUTAMINE STRESS ECHO  11/00   wnl  . DOPPLER ECHOCARDIOGRAPHY     Secondary to Redux, wnl  . ESOPHAGOGASTRODUODENOSCOPY  8/01   Esophagitis [name])  .  FOOT SURGERY  04/2015   Pin in small toe  . 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 NSVD     X 2  . SBFT  08/01   wnl  . TONSILLECTOMY     As a child  . TUBAL LIGATION  Late 20's     OB History   No obstetric history on file.     Family History  Problem Relation Age of Onset  . Heart disease Mother        CAD, angioplasty twice with stents  . Cancer Mother 39       Breast  . Breast cancer Mother 22  . Hypertension Mother   . Peripheral Artery Disease Mother   . Hypertension  Father   . Hypothyroidism Father   . Heart disease Father        MI at age 11  . Cancer Father 5       H/O renal CA  . Diabetes Sister   . Cancer Sister 70       Renal CA  . Factor V Leiden deficiency Brother   . Clotting disorder Brother   . Colon cancer Neg Hx     Social History   Tobacco Use  . Smoking status: Never Smoker  . Smokeless tobacco: Never Used  Substance Use Topics  . Alcohol use: Yes    Alcohol/week: 3.0 standard drinks    Types: 2 Glasses of wine, 1 Cans of beer per week    Comment: cvouple glsses of wine over weekdend and beer monday   . Drug use: No    Home Medications Prior to Admission medications   Medication Sig Start Date End Date Taking? Authorizing Provider  albuterol (PROVENTIL HFA;VENTOLIN HFA) 108 (90 Base) MCG/ACT inhaler Inhale 2 puffs into the lungs every 4 (four) hours as needed for wheezing or shortness of breath. 06/20/18  Yes Pleas Koch, NP  cyanocobalamin (,VITAMIN B-12,) 1000 MCG/ML injection INJECT 1ML INTRAMUSCULARLY EVERY 14 DAYS Patient taking differently: Inject 1,000 mcg into the muscle every 14 (fourteen) days.  05/15/19  Yes Tonia Ghent, MD  gabapentin (NEURONTIN) 300 MG capsule TAKE TWO CAPSULES BY MOUTH TWICE A DAY FOR HEADACHE Patient taking differently: Take 600 mg by mouth 2 (two) times daily.  04/05/19  Yes Tonia Ghent, MD  Insulin Syringe-Needle U-100 (B-D INSULIN SYRINGE 1CC/25GX1") 25G X 1" 1 ML MISC Inject 1 Syringe into the muscle every 14 (fourteen) days. For B12 injection 11/16/18  Yes Tonia Ghent, MD  lidocaine (XYLOCAINE) 2 % solution Use 5 ml up to 4 times a day as needed. 07/08/18  Yes Tonia Ghent, MD  lipase/protease/amylase (CREON) 36000 UNITS CPEP capsule Take 1 capsule (36,000 Units total) by mouth 3 (three) times daily before meals. Patient taking differently: Take 36,000 Units by mouth 3 (three) times daily as needed.  05/22/19  Yes Tonia Ghent, MD  metoprolol tartrate (LOPRESSOR) 50  MG tablet TAKE ONE TABLET BY MOUTH TWICE A DAY Patient taking differently: Take 50 mg by mouth 2 (two) times daily.  06/07/19  Yes Tonia Ghent, MD  omeprazole (PRILOSEC) 40 MG capsule TAKE ONE CAPSULE BY MOUTH DAILY - MUST KEEP APPOINTMENT ON 05/14/19 Patient taking differently: Take 40 mg by mouth daily.  06/06/19  Yes Tonia Ghent, MD  ondansetron (ZOFRAN ODT) 4 MG disintegrating tablet Take 1 tablet (4 mg total) by mouth every 8 (eight) hours as needed for nausea or vomiting. 05/12/19  Yes  Joaquim Nam, MD  pantoprazole (PROTONIX) 40 MG tablet TAKE ONE TABLET BY MOUTH TWICE A DAY Patient taking differently: Take 40 mg by mouth 2 (two) times daily.  05/29/19  Yes Joaquim Nam, MD  promethazine (PHENERGAN) 12.5 MG tablet TAKE ONE TABLET BY MOUTH EVERY 6 HOURS AS NEEDED FOR NAUSEA AND VOMITING Patient taking differently: Take 12.5 mg by mouth every 6 (six) hours as needed for nausea or vomiting.  03/10/19  Yes Joaquim Nam, MD  traMADol (ULTRAM) 50 MG tablet TAKE ONE TABLET BY MOUTH EVERY 8 HOUR AS NEEDED FOR PAIN Patient taking differently: Take 50 mg by mouth every 8 (eight) hours as needed for moderate pain.  03/24/19  Yes Joaquim Nam, MD  venlafaxine (EFFEXOR) 75 MG tablet TAKE ONE TABLET BY MOUTH TWICE A DAY Patient taking differently: Take 75 mg by mouth 2 (two) times daily.  03/27/19  Yes Joaquim Nam, MD    Allergies    Latex, Atorvastatin, Dilaudid [hydromorphone hcl], Lexapro [escitalopram oxalate], Lisinopril, Mirtazapine, Nsaids, and Tylenol [acetaminophen]  Review of Systems   Review of Systems Ten systems reviewed and are negative for acute change, except as noted in the HPI. '  Physical Exam Updated Vital Signs BP 113/72   Pulse 99   Temp (!) 97.5 F (36.4 C) (Oral)   Resp 17   Ht 5' (1.524 m)   Wt 68 kg   LMP  (LMP Unknown) Comment: tubal  SpO2 98%   BMI 29.29 kg/m   Physical Exam Vitals and nursing note reviewed.  Constitutional:       General: She is not in acute distress.    Appearance: She is well-developed. She is ill-appearing. She is not diaphoretic.     Comments: Week, ill-appearing, somnolent and drifts off to sleep during conversation.  HENT:     Head: Normocephalic and atraumatic.  Eyes:     General: No scleral icterus.    Extraocular Movements: Extraocular movements intact.     Conjunctiva/sclera: Conjunctivae normal.     Pupils: Pupils are equal, round, and reactive to light.  Cardiovascular:     Rate and Rhythm: Normal rate and regular rhythm.     Heart sounds: Normal heart sounds. No murmur. No friction rub. No gallop.   Pulmonary:     Effort: Pulmonary effort is normal. No tachypnea or respiratory distress.     Breath sounds: Normal breath sounds. No decreased breath sounds, wheezing, rhonchi or rales.  Abdominal:     General: Bowel sounds are normal. There is no distension.     Palpations: Abdomen is soft. There is no mass.     Tenderness: There is no abdominal tenderness. There is no guarding.  Musculoskeletal:     Cervical back: Normal range of motion.     Right lower leg: Edema present.     Left lower leg: Edema present.     Comments: Tense pitting edema of the bilateral lower extremities with weeping bullae. Pitting edema extends up to the umbilicus.  Skin:    General: Skin is warm and dry.  Neurological:     Mental Status: She is alert and oriented to person, place, and time.  Psychiatric:        Behavior: Behavior normal.     ED Results / Procedures / Treatments   Labs (all labs ordered are listed, but only abnormal results are displayed) Labs Reviewed  COMPREHENSIVE METABOLIC PANEL - Abnormal; Notable for the following components:  Result Value   Sodium 127 (*)    Potassium 5.2 (*)    Chloride 96 (*)    CO2 19 (*)    Creatinine, Ser 1.07 (*)    Calcium 7.7 (*)    Total Protein 6.1 (*)    Albumin 1.5 (*)    AST 65 (*)    Alkaline Phosphatase 131 (*)    Total Bilirubin 1.9  (*)    GFR calc non Af Amer 58 (*)    All other components within normal limits  CBC - Abnormal; Notable for the following components:   RBC 3.68 (*)    MCV 102.4 (*)    Platelets 113 (*)    All other components within normal limits  PROTIME-INR - Abnormal; Notable for the following components:   Prothrombin Time 19.6 (*)    INR 1.7 (*)    All other components within normal limits  CBC - Abnormal; Notable for the following components:   RBC 3.57 (*)    Hemoglobin 11.7 (*)    HCT 35.2 (*)    All other components within normal limits  COMPREHENSIVE METABOLIC PANEL - Abnormal; Notable for the following components:   Sodium 127 (*)    Chloride 95 (*)    Calcium 7.8 (*)    Total Protein 6.0 (*)    Albumin 1.5 (*)    AST 47 (*)    Total Bilirubin 2.2 (*)    All other components within normal limits  OSMOLALITY - Abnormal; Notable for the following components:   Osmolality 272 (*)    All other components within normal limits  LIPASE, BLOOD  AMMONIA  HEPATITIS PANEL, ACUTE  HIV ANTIBODY (ROUTINE TESTING W REFLEX)  MAGNESIUM  PHOSPHORUS  CBC  COMPREHENSIVE METABOLIC PANEL  SODIUM, URINE, RANDOM  PROTEIN / CREATININE RATIO, URINE  OSMOLALITY, URINE    EKG None  Radiology DG Chest Port 1 View  Result Date: 06/08/2019 CLINICAL DATA:  Shortness of breath. Leg edema. COVID in January. EXAM: PORTABLE CHEST 1 VIEW COMPARISON:  Radiograph 05/12/2019 FINDINGS: Lower lung volumes from prior exam. Stable streaky left basilar scarring. Unchanged heart size and mediastinal contours. No pulmonary edema. No large pleural effusion. No focal airspace disease. No pneumothorax. Surgical clips at the gastroesophageal junction. No acute osseous abnormalities are seen. IMPRESSION: Lower lung volumes from radiographs last month. Stable streaky left basilar scarring. No new or acute findings. Electronically Signed   By: Narda Rutherford M.D.   On: 06/08/2019 16:08   US ABDOMEN LIMITED RUQ  Result  Date: 06/08/2019 CLINICAL DATA:  59 year old female with right upper quadrant abdominal pain. EXAM: ULTRASOUND ABDOMEN LIMITED RIGHT UPPER QUADRANT COMPARISON:  Right upper quadrant ultrasound dated 05/14/2017 and CT abdomen pelvis dated 03/06/2019. FINDINGS: Gallbladder: Cholecystectomy. Common bile duct: Diameter: 3 mm Liver: There is diffuse increased liver echogenicity consistent with fatty infiltration as seen on the prior CT. There is mild irregularity of the liver contour suggestive of early cirrhosis. Portal vein is patent on color Doppler imaging with normal direction of blood flow towards the liver. Other: Small ascites. IMPRESSION: 1. Fatty liver with findings suggestive of early cirrhosis. Further evaluation with elastography on a nonemergent/outpatient basis recommended. 2. Patent main portal vein with hepatopetal flow. 3. Small ascites. Electronically Signed   By: Elgie Collard M.D.   On: 06/08/2019 16:50    Procedures Procedures (including critical care time)  Medications Ordered in ED Medications  sodium chloride flush (NS) 0.9 % injection 3 mL (3 mLs Intravenous  Given 06/08/19 2202)  senna (SENOKOT) tablet 8.6 mg (8.6 mg Oral Given 06/08/19 2159)  magnesium hydroxide (MILK OF MAGNESIA) suspension 30 mL (has no administration in time range)  sorbitol 70 % solution 30 mL (has no administration in time range)  sodium phosphate (FLEET) 7-19 GM/118ML enema 1 enema (has no administration in time range)  ondansetron (ZOFRAN) tablet 4 mg (has no administration in time range)    Or  ondansetron (ZOFRAN) injection 4 mg (has no administration in time range)  enoxaparin (LOVENOX) injection 40 mg (40 mg Subcutaneous Refused 06/08/19 2201)  LORazepam (ATIVAN) tablet 1-4 mg (has no administration in time range)    Or  LORazepam (ATIVAN) injection 1-4 mg (has no administration in time range)  thiamine tablet 100 mg (100 mg Oral Given 06/08/19 2201)    Or  thiamine (B-1) injection 100 mg (  Intravenous See Alternative 06/08/19 2201)  folic acid (FOLVITE) tablet 1 mg (1 mg Oral Not Given 06/08/19 2201)  multivitamin with minerals tablet 1 tablet (1 tablet Oral Given 06/08/19 2159)  furosemide (LASIX) injection 20 mg (20 mg Intravenous Given 06/08/19 2144)    ED Course  I have reviewed the triage vital signs and the nursing notes.  Pertinent labs & imaging results that were available during my care of the patient were reviewed by me and considered in my medical decision making (see chart for details).    MDM Rules/Calculators/A&P                      59 year old female here with generalized weakness. The differential diagnosis of weakness includes but is not limited to neurologic causes (GBS, myasthenia gravis, CVA, MS, ALS, transverse myelitis, spinal cord injury, CVA, botulism, ) and other causes: ACS, Arrhythmia, syncope, orthostatic hypotension, sepsis, hypoglycemia, electrolyte disturbance, hypothyroidism, respiratory failure, symptomatic anemia, dehydration, heat injury, polypharmacy, malignancy. Patient does not have any apparent neurologic causes.  I have reviewed the patient's labs which shows worsening liver function, elevated PT/INR, CBC shows mildly low hemoglobin which is normocytic.  Patient's bilirubin is slightly elevated.  Albumin level is 1.5.  Patient third spacing fluids, anasarca, hyponatremic, but also has acute kidney injury with a value of 1.07 which is near double her baseline.  Patient will be admitted to the hospitalist service. Final Clinical Impression(s) / ED Diagnoses Final diagnoses:  RUQ pain  AKI (acute kidney injury) (HCC)  Anasarca  Liver disease  Elevated INR  Failure to thrive in adult    Rx / DC Orders ED Discharge Orders    None       Arthor Captain, PA-C 06/09/19 0030    Geoffery Lyons, MD 06/09/19 [phone]

## 2019-06-08 NOTE — ED Notes (Signed)
Per Dr. Pola Corn patient does not need COVID swab due to testing positive in January.

## 2019-06-08 NOTE — ED Notes (Signed)
Phoned report to receiving 6N RN. Pt belongings gathered. Pt in stable condition. Will facilitate transport.

## 2019-06-08 NOTE — Progress Notes (Signed)
This visit occurred during the SARS-CoV-2 public health emergency.  Safety protocols were in place, including screening questions prior to the visit, additional usage of staff PPE, and extensive cleaning of exam room while observing appropriate contact time as indicated for disinfecting solutions.  Prev with pancreatic insufficiency/pancreatitis.  Had d/w pt about creon use prev.  She didn't tolerate it due to abdominal pain with used but she did have improvement in her stools with use, less greasy stools.  She still has dec in appetite and can't eat a big meal.  She has more leg swelling.  She is more SOB in the meantime, worse recently.  She can't walk as far as she prev did.    She is living at home with her boyfriend.    Meds, vitals, and allergies reviewed.   ROS: Per HPI unless specifically indicated in ROS section   Speaking in complete sentences.   In wheelchair.  ncat Neck supple, no LA Mild retractions on inspiration with dec BS at the bases.  No wheeze.  rrr abd soft, normal BS ext  3+ BLE edema with small weeping vesicles noted.

## 2019-06-08 NOTE — ED Notes (Signed)
Pt transported to [facility]

## 2019-06-08 NOTE — H&P (Signed)
[facility] History and Physical  Abigail [name] NAT:[id number] DOB: [birth year] DOA: 06/08/2019 PCP: Tonia Ghent, MD  Patient coming from: Home Chief Complaint: Bilateral lower extremity edema, abdominal swelling  History of Present Illness: Abigail [name] is a 59 y.o. female with past medical history of alcoholism, chronic pancreatitis, gastric bypass surgery 2005, hypertension, portal vein thrombosis 2019, Covid 19 infection in December 2020. Patient presented to the ED today with complaint of worsening bilateral lower extremity edema for last 1 month associated with abdominal swelling, worsening fatigue, poor oral intake. She had gastric bypass surgery done in 2005 at [facility] and has been taking nutritional supplements. She used to be a heavy drinker until a year ago and has chronic pancreatitis, gets intermittent nausea, vomiting and takes pancreatic enzyme supplements.   1 year ago, patient had intractable nausea, vomiting, admitted to [facility] and subsequently transferred to [facility].  CAD underwent EGD, does not recall finding any ulcer or bleeding.  She states she tested positive for H. pylori at that time.  She states she significantly reduced alcohol intake since then and is now an occasional drinker. She lives with her boyfriend at her house and drinks mostly in the weekend.  Last drink was last weekend. CT scan from November 2020 shows extensive fatty liver. In December 2020, patient had COVID-19 infection.  She did not require hospitalization.  She says she received an outpatient infusion, which I'm assuming was Bamlanivimumab.  Since that infection, patient states she gets burning sensation in the mouth with anything.  She has had compromised oral intake, hydration and worsening weakness. Second week of January, patient started noticing bilateral lower extremity swelling which progressively got worse and spread up to the abdomen.  She has also noticed progressive fatigue and worsening  oral intake.  She has noticed bright red blood in toilet paper in few occasions.  No vomiting of blood. No fever, chest pain, shortness of breath, focal neurological deficit.  In the ED, patient is afebrile, heart rate in 80s, blood pressure in normal range, oxygen saturation 100% on room air.  Blood work showed sodium 127, potassium 5.2, serum bicarb 19, creatinine 1.07,  albumin 1.5, AST ALT/alk phos/total bili/ammonia respectively at 65/29/131/1 0.9/35, INR 1.7 WBC count 8.5, hemoglobin 12.2, platelets 113, Chest x-ray normal Ultrasound right upper quadrant abdomen showed fatty liver with findings suggestive of early cirrhosis.  Small ascites.   Review of Systems:  All systems were reviewed and were negative unless otherwise mentioned in the HPI   Past medical history: Past Medical History:  Diagnosis Date  . Alcoholism ([place])   . Cholecystitis   . COVID-19 virus infection 03/2019  . GERD (gastroesophageal reflux disease)   . Headache    improved with gabapentin   . Hypertension   . Pancreatic insufficiency   . Pancreatitis 10/2015   2013 alcohol and/or gallstone  . Photosensitivity    (No formal dx of lupus) prev eval Dr. Jefm Bryant in [place]  . Portal vein thrombosis 2019    Past surgical history: Past Surgical History:  Procedure Laterality Date  . APPENDECTOMY     As a child  . CAT scan  11/00   wnl  . CHOLECYSTECTOMY  04/20/2012   Procedure: LAPAROSCOPIC CHOLECYSTECTOMY WITH INTRAOPERATIVE CHOLANGIOGRAM;  Surgeon: Merrie Roof, MD;  Location: [place] [facility];  Service: General;  Laterality: N/A;  . COLONOSCOPY  2005  . DOBUTAMINE STRESS ECHO  11/00   wnl  . DOPPLER ECHOCARDIOGRAPHY     Secondary  to [place], wnl  . ESOPHAGOGASTRODUODENOSCOPY  8/01   Esophagitis [place])  . FOOT SURGERY  04/2015   Pin in small toe  . 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 NSVD     X 2  . SBFT  08/01   wnl  . TONSILLECTOMY     As a child  . TUBAL LIGATION  Late 20's      Social History:  reports that she has never smoked. She has never used smokeless tobacco. She reports current alcohol use of about 3.0 standard drinks of alcohol per week. She reports that she does not use drugs.  Allergies:  Allergies  Allergen Reactions  . Latex Rash    "hands turn beat red and start to itch"  . Atorvastatin Other (See Comments)    Swollen joints  . Dilaudid [Hydromorphone Hcl] Itching and Other (See Comments)    Itching- no rash- likely side effect but not true allergy.  Tolerates oxycodone.   Loma Sousa [Escitalopram Oxalate] Other (See Comments)    Psychological changes  . Lisinopril Other (See Comments)    Hives, presumed allergy  . Mirtazapine Swelling    swelling  . Nsaids     Gastritis 2013  . Tylenol [Acetaminophen] Other (See Comments)    Hx pancreatitis    Family history:  Family History  Problem Relation Age of Onset  . Heart disease Mother        CAD, angioplasty twice with stents  . Cancer Mother 16       Breast  . Breast cancer Mother 41  . Hypertension Mother   . Peripheral Artery Disease Mother   . Hypertension Father   . Hypothyroidism Father   . Heart disease Father        MI at age 13  . Cancer Father 50       H/O renal CA  . Diabetes Sister   . Cancer Sister 50       Renal CA  . Factor V Leiden deficiency Brother   . Clotting disorder Brother   . Colon cancer Neg Hx      Home Meds: Prior to Admission medications   Medication Sig Start Date End Date Taking? Authorizing Provider  albuterol (PROVENTIL HFA;VENTOLIN HFA) 108 (90 Base) MCG/ACT inhaler Inhale 2 puffs into the lungs every 4 (four) hours as needed for wheezing or shortness of breath. 06/20/18  Yes Pleas Koch, NP  cyanocobalamin (,VITAMIN B-12,) 1000 MCG/ML injection INJECT 1ML INTRAMUSCULARLY EVERY 14 DAYS Patient taking differently: Inject 1,000 mcg into the muscle every 14 (fourteen) days.  05/15/19  Yes Tonia Ghent, MD  gabapentin (NEURONTIN) 300 MG  capsule TAKE TWO CAPSULES BY MOUTH TWICE A DAY FOR HEADACHE Patient taking differently: Take 600 mg by mouth 2 (two) times daily.  04/05/19  Yes Tonia Ghent, MD  Insulin Syringe-Needle U-100 (B-D INSULIN SYRINGE 1CC/25GX1") 25G X 1" 1 ML MISC Inject 1 Syringe into the muscle every 14 (fourteen) days. For B12 injection 11/16/18  Yes Tonia Ghent, MD  lidocaine (XYLOCAINE) 2 % solution Use 5 ml up to 4 times a day as needed. 07/08/18  Yes Tonia Ghent, MD  lipase/protease/amylase (CREON) 36000 UNITS CPEP capsule Take 1 capsule (36,000 Units total) by mouth 3 (three) times daily before meals. Patient taking differently: Take 36,000 Units by mouth 3 (three) times daily as needed.  05/22/19  Yes Tonia Ghent, MD  metoprolol tartrate (LOPRESSOR) 50 MG tablet TAKE ONE TABLET  BY MOUTH TWICE A DAY Patient taking differently: Take 50 mg by mouth 2 (two) times daily.  06/07/19  Yes Tonia Ghent, MD  omeprazole (PRILOSEC) 40 MG capsule TAKE ONE CAPSULE BY MOUTH DAILY - MUST KEEP APPOINTMENT ON 05/14/19 Patient taking differently: Take 40 mg by mouth daily.  06/06/19  Yes Tonia Ghent, MD  ondansetron (ZOFRAN ODT) 4 MG disintegrating tablet Take 1 tablet (4 mg total) by mouth every 8 (eight) hours as needed for nausea or vomiting. 05/12/19  Yes Tonia Ghent, MD  pantoprazole (PROTONIX) 40 MG tablet TAKE ONE TABLET BY MOUTH TWICE A DAY Patient taking differently: Take 40 mg by mouth 2 (two) times daily.  05/29/19  Yes Tonia Ghent, MD  promethazine (PHENERGAN) 12.5 MG tablet TAKE ONE TABLET BY MOUTH EVERY 6 HOURS AS NEEDED FOR NAUSEA AND VOMITING Patient taking differently: Take 12.5 mg by mouth every 6 (six) hours as needed for nausea or vomiting.  03/10/19  Yes Tonia Ghent, MD  traMADol (ULTRAM) 50 MG tablet TAKE ONE TABLET BY MOUTH EVERY 8 HOUR AS NEEDED FOR PAIN Patient taking differently: Take 50 mg by mouth every 8 (eight) hours as needed for moderate pain.  03/24/19  Yes Tonia Ghent, MD  venlafaxine (EFFEXOR) 75 MG tablet TAKE ONE TABLET BY MOUTH TWICE A DAY Patient taking differently: Take 75 mg by mouth 2 (two) times daily.  03/27/19  Yes Tonia Ghent, MD    Physical Exam: Vitals:   06/08/19 1700 06/08/19 1715 06/08/19 1745 06/08/19 1815  BP:      Pulse: 85 87 81 92  Resp: _0 Temp:      TempSrc:      SpO2: 99% 99% 99% 100%  Weight:      Height:       Wt Readings from Last 3 Encounters:  06/08/19 68 kg  05/18/19 69.9 kg  05/12/19 67.2 kg   Body mass index is 29.29 kg/m.  General exam: Appears calm and comfortable.  Skin: No rashes, lesions or ulcers. HEENT: Atraumatic, normocephalic, supple neck, no obvious bleeding Lungs: Clear to auscultation bilaterally CVS: Regular rate and rhythm, no murmur GI/Abd soft, mildly distended, minimal tenderness in the periumbilical area, bowel sounds present CNS: Alert, awake, oriented x3 Psychiatry: Depressed look Extremities: 1+ pedal edema bilaterally, no calf tenderness     Consult Orders  (From admission, onward)         Start     Ordered   06/08/19 1715  Consult to hospitalist  ALL PATIENTS BEING ADMITTED/HAVING PROCEDURES NEED COVID-19 SCREENING  Once    Comments: ALL PATIENTS BEING ADMITTED/HAVING PROCEDURES NEED COVID-19 SCREENING  Provider:  (Not yet assigned)  Question Answer Comment  Place call to: [facility]   Reason for Consult Admit      06/08/19 1714          Labs on Admission:   CBC: Recent Labs  Lab 06/08/19 1127  WBC 8.5  HGB 12.2  HCT 37.7  MCV 102.4*  PLT 113*    Basic Metabolic Panel: Recent Labs  Lab 06/08/19 1127  NA 127*  K 5.2*  CL 96*  CO2 19*  GLUCOSE 91  BUN 8  CREATININE 1.07*  CALCIUM 7.7*    Liver Function Tests: Recent Labs  Lab 06/08/19 1127  AST 65*  ALT 29  ALKPHOS 131*  BILITOT 1.9*  PROT 6.1*  ALBUMIN 1.5*   Recent Labs  Lab 06/08/19 1127  LIPASE 14   Recent Labs  Lab 06/08/19 1623  AMMONIA 35     Cardiac Enzymes: No results for input(s): CKTOTAL, CKMB, CKMBINDEX, TROPONINI in the last 168 hours.  BNP (last 3 results) No results for input(s): BNP in the last 8760 hours.  ProBNP (last 3 results) Recent Labs    05/12/19 0935  PROBNP 203.0*    CBG: No results for input(s): GLUCAP in the last 168 hours.  Lipase     Component Value Date/Time   LIPASE 14 06/08/2019 1127   LIPASE 751 (H) 09/09/2012 0604     Urinalysis    Component Value Date/Time   COLORURINE YELLOW (A) 03/06/2019 1619   APPEARANCEUR CLEAR (A) 03/06/2019 1619   APPEARANCEUR Clear 06/16/2013 1857   LABSPEC 1.039 (H) 03/06/2019 1619   LABSPEC 1.024 06/16/2013 1857   PHURINE 7.0 03/06/2019 [place] 03/06/2019 1619   GLUCOSEU Negative 06/16/2013 1857   HGBUR MODERATE (A) 03/06/2019 1619   BILIRUBINUR Neg 05/12/2019 0939   BILIRUBINUR Negative 06/16/2013 1857   KETONESUR NEGATIVE 03/06/2019 1619   PROTEINUR Negative 05/12/2019 0939   PROTEINUR NEGATIVE 03/06/2019 1619   UROBILINOGEN 0.2 05/12/2019 0939   UROBILINOGEN 2.0 (H) 04/19/2012 1205   NITRITE Positive 05/12/2019 0939   NITRITE POSITIVE (A) 03/06/2019 1619   LEUKOCYTESUR Large (3+) (A) 05/12/2019 0939   LEUKOCYTESUR TRACE (A) 03/06/2019 1619   LEUKOCYTESUR Negative 06/16/2013 1857     Drugs of Abuse     Component Value Date/Time   LABOPIA NEGATIVE 06/16/2013 1857   COCAINSCRNUR NEGATIVE 06/16/2013 1857   LABBENZ NEGATIVE 06/16/2013 1857   AMPHETMU NEGATIVE 06/16/2013 1857   THCU NEGATIVE 06/16/2013 1857   LABBARB NEGATIVE 06/16/2013 1857      Radiological Exams on Admission: DG Chest Port 1 View  Result Date: 06/08/2019 CLINICAL DATA:  Shortness of breath. Leg edema. COVID in January. EXAM: PORTABLE CHEST 1 VIEW COMPARISON:  Radiograph 05/12/2019 FINDINGS: Lower lung volumes from prior exam. Stable streaky left basilar scarring. Unchanged heart size and mediastinal contours. No pulmonary edema. No large pleural  effusion. No focal airspace disease. No pneumothorax. Surgical clips at the gastroesophageal junction. No acute osseous abnormalities are seen. IMPRESSION: Lower lung volumes from radiographs last month. Stable streaky left basilar scarring. No new or acute findings. Electronically Signed   By: Keith Rake M.D.   On: 06/08/2019 16:08   US ABDOMEN LIMITED RUQ  Result Date: 06/08/2019 CLINICAL DATA:  59 year old female with right upper quadrant abdominal pain. EXAM: ULTRASOUND ABDOMEN LIMITED RIGHT UPPER QUADRANT COMPARISON:  Right upper quadrant ultrasound dated 05/14/2017 and CT abdomen pelvis dated 03/06/2019. FINDINGS: Gallbladder: Cholecystectomy. Common bile duct: Diameter: 3 mm Liver: There is diffuse increased liver echogenicity consistent with fatty infiltration as seen on the prior CT. There is mild irregularity of the liver contour suggestive of early cirrhosis. Portal vein is patent on color Doppler imaging with normal direction of blood flow towards the liver. Other: Small ascites. IMPRESSION: 1. Fatty liver with findings suggestive of early cirrhosis. Further evaluation with elastography on a nonemergent/outpatient basis recommended. 2. Patent main portal vein with hepatopetal flow. 3. Small ascites. Electronically Signed   By: Anner Crete M.D.   On: 06/08/2019 16:50     ------------------------------------------------------------------------------------------------------ Assessment/Plan: Active Problems:   Electrolyte abnormality  Generalized weakness -Multifactorial: Liver cirrhosis, chronic pancreatitis, poor appetite, low electrolytes, recent COVID-19 infection -See below for the details of each.  Does not seem to have any infection at this time -Needs to encourage  oral appetite, PT eval.  Alcoholic liver disease History of portal venous thrombosis in 2019 -History of chronic alcoholism.  Extensive fatty liver and CT scan from November 2020.  Ultrasound abdomen with  findings of early liver cirrhosis. -Liver enzymes are slightly abnormal.  INR elevated at 1.7.  Albumin significantly low at 1.5 -By history, patient had portal venous thrombosis in 2019 -Ultrasound abdomen does not show evidence of portal hypertension, only mild ascites.  Clinically has 1+ bilateral pedal edema.  No evidence of SBP. -she will benefit from GI evaluation inpatient versus outpatient. -Occasional alcohol user now.  Counseled to quit.  However, uncertain of reliability.  I will likely start of CIWA protocol with as needed Ativan. -Obtain ultrasound duplex of both extremities to rule out DVT as a cause of swelling.  Hyponatremia -Suspect hypervolemic hyponatremia.  May also be partly secondary to poor oral intake. -Sodium level low at 127.  Ordered for serum osmolality, urine osmolality, urine sodium and urine protein/creatinine ratio.  -For now, I will give her a dose of Lasix 20 mg IV 1 dose now to start on fluid restriction at 1500 mL. -Clear liquid diet for now.  If no evidence of bleeding overnight, may defer GI work-up outpatient and start on low-sodium diet from the morning. -Watch renal function.  Hyperkalemia -Potassium level elevated to 5.2.  Continue to monitor.  May improve with Lasix.  Low albumin Protein calorie malnutrition -Albumin level low at 1.5.  Her baseline albumin level already seems low between 2-2.5.  Likely related to liver disease and poor intake. -Nutrition consulted.  History of gastric bypass surgery 2005 -Probably followed by nutritional deficiencies.  -I will obtain levels of vitamin D, vitamin B12, folate, ferritin. -Macrocytosis could be due to alcohol, liver disease or deficiencies.  Chronic pancreatitis -Due to alcohol.  Treated with intermittent nausea, vomiting.  Uses pancreatic enzymes at home.  Continue the same.  Thrombocytopenia -Platelet level low at 113.  Likely because of liver disease.  Continue to monitor.  Recent COVID-19  infection -Did not need hospitalization at that time.  She took outpatient monoclonal antibody infusion. -The only residual symptom seems to be burning sensation in the mouth with any food.  DVT prophylaxis:  Lovenox subcu Antimicrobials:  None Fluid: None Diet: Clear liquid diet  Code Status:  Full code Mobility: PT eval ordered Family Communication:  Not at bedside Discharge plan:  Anticipated date: At least 2 to 3 days in the hospital  disposition: Likely home  Consultants:  None yet  ----------------------------------------------------------------------------------------------------------------------------------------------------------- Severity of Illness: The appropriate patient status for this patient is INPATIENT. Inpatient status is judged to be reasonable and necessary in order to provide the required intensity of service to ensure the patient's safety. The patient's presenting symptoms, physical exam findings, and initial radiographic and laboratory data in the context of their chronic comorbidities is felt to place them at high risk for further clinical deterioration. Furthermore, it is not anticipated that the patient will be medically stable for discharge from the hospital within 2 midnights of admission. The following factors support the patient status of inpatient.   " The patient's presenting symptoms include shortness of breath, bilateral lower extremity swelling. " The worrisome physical exam findings include bilateral pedal edema, abdominal edema. " The initial radiographic and laboratory data are worrisome because of liver cirrhosis, low electrolytes. " The chronic co-morbidities include chronic alcoholism, chronic pancreatitis, recent COVID-19 infection.   * I certify that at the point of admission it is my  clinical judgment that the patient will require inpatient hospital care spanning beyond 2 midnights from the point of admission due to high intensity of  service, high risk for further deterioration and high frequency of surveillance required.*   -----------------------------------------------------------------------------------------------------  Cline Cools, MD [facility] Pager: [phone] (Secure Chat preferred). 06/08/2019

## 2019-06-09 ENCOUNTER — Encounter (HOSPITAL_COMMUNITY): Payer: Self-pay | Admitting: Internal Medicine

## 2019-06-09 ENCOUNTER — Inpatient Hospital Stay (HOSPITAL_COMMUNITY): Payer: 59

## 2019-06-09 DIAGNOSIS — R609 Edema, unspecified: Secondary | ICD-10-CM

## 2019-06-09 LAB — COMPREHENSIVE METABOLIC PANEL
ALT: 21 U/L (ref 0–44)
AST: 45 U/L — ABNORMAL HIGH (ref 15–41)
Albumin: 1.3 g/dL — ABNORMAL LOW (ref 3.5–5.0)
Alkaline Phosphatase: 120 U/L (ref 38–126)
Anion gap: 9 (ref 5–15)
BUN: 9 mg/dL (ref 6–20)
CO2: 24 mmol/L (ref 22–32)
Calcium: 7.6 mg/dL — ABNORMAL LOW (ref 8.9–10.3)
Chloride: 96 mmol/L — ABNORMAL LOW (ref 98–111)
Creatinine, Ser: 0.9 mg/dL (ref 0.44–1.00)
GFR calc Af Amer: >60 mL/min (ref >60)
GFR calc non Af Amer: >60 mL/min (ref >60)
Glucose, Bld: 75 mg/dL (ref 70–99)
Potassium: 5 mmol/L (ref 3.5–5.1)
Sodium: 129 mmol/L — ABNORMAL LOW (ref 135–145)
Total Bilirubin: 2.3 mg/dL — ABNORMAL HIGH (ref 0.3–1.2)
Total Protein: 5.6 g/dL — ABNORMAL LOW (ref 6.5–8.1)

## 2019-06-09 LAB — CBC
HCT: 32.2 % — ABNORMAL LOW (ref 36.0–46.0)
Hemoglobin: 10.9 g/dL — ABNORMAL LOW (ref 12.0–15.0)
MCH: 33.1 pg (ref 26.0–34.0)
MCHC: 33.9 g/dL (ref 30.0–36.0)
MCV: 97.9 fL (ref 80.0–100.0)
Platelets: 184 10*3/uL (ref 150–400)
RBC: 3.29 MIL/uL — ABNORMAL LOW (ref 3.87–5.11)
RDW: 14.6 % (ref 11.5–15.5)
WBC: 9.1 10*3/uL (ref 4.0–10.5)
nRBC: 0 % (ref 0.0–0.2)

## 2019-06-09 LAB — PROTEIN / CREATININE RATIO, URINE
Creatinine, Urine: 63.57 mg/dL
Total Protein, Urine: <6 mg/dL

## 2019-06-09 LAB — SODIUM, URINE, RANDOM: Sodium, Ur: 30 mmol/L

## 2019-06-09 LAB — OSMOLALITY, URINE: Osmolality, Ur: 223 mOsm/kg — ABNORMAL LOW (ref 300–900)

## 2019-06-09 MED ORDER — PRO-STAT SUGAR FREE PO LIQD
30.0000 mL | Freq: Three times a day (TID) | ORAL | Status: DC
  Administered 2019-06-09 – 2019-06-11 (×6): 30 mL via ORAL
  Filled 2019-06-09 (×6): qty 30

## 2019-06-09 MED ORDER — TRAMADOL HCL 50 MG PO TABS
50.0000 mg | ORAL_TABLET | Freq: Once | ORAL | Status: AC
  Administered 2019-06-09: 50 mg via ORAL
  Filled 2019-06-09: qty 1

## 2019-06-09 MED ORDER — BOOST / RESOURCE BREEZE PO LIQD CUSTOM
1.0000 | Freq: Three times a day (TID) | ORAL | Status: DC
  Administered 2019-06-09 – 2019-06-11 (×4): 1 via ORAL

## 2019-06-09 MED ORDER — FUROSEMIDE 10 MG/ML IJ SOLN
40.0000 mg | Freq: Every day | INTRAMUSCULAR | Status: DC
  Administered 2019-06-09: 40 mg via INTRAVENOUS
  Filled 2019-06-09: qty 4

## 2019-06-09 MED ORDER — ENOXAPARIN SODIUM 80 MG/0.8ML ~~LOC~~ SOLN
65.0000 mg | Freq: Two times a day (BID) | SUBCUTANEOUS | Status: DC
  Administered 2019-06-09 – 2019-06-10 (×2): 65 mg via SUBCUTANEOUS
  Filled 2019-06-09 (×2): qty 0.8

## 2019-06-09 MED ORDER — TRAMADOL HCL 50 MG PO TABS
50.0000 mg | ORAL_TABLET | Freq: Two times a day (BID) | ORAL | Status: DC | PRN
  Administered 2019-06-09 – 2019-06-11 (×2): 50 mg via ORAL
  Filled 2019-06-09 (×2): qty 1

## 2019-06-09 NOTE — Progress Notes (Signed)
ANTICOAGULATION CONSULT NOTE - Follow Up Consult  Pharmacy Consult for Lovenox Indication: DVT  Allergies  Allergen Reactions  . Latex Rash    "hands turn beat red and start to itch"  . Atorvastatin Other (See Comments)    Swollen joints  . Dilaudid [Hydromorphone Hcl] Itching and Other (See Comments)    Itching- no rash- likely side effect but not true allergy.  Tolerates oxycodone.   [name] [Escitalopram Oxalate] Other (See Comments)    Psychological changes  . Lisinopril Other (See Comments)    Hives, presumed allergy  . Mirtazapine Swelling    swelling  . Nsaids     Gastritis 2013  . Tylenol [Acetaminophen] Other (See Comments)    Hx pancreatitis    Patient Measurements: Height: 5' (152.4 cm) Weight: 150 lb (68 kg) IBW/kg (Calculated) : 45.5   Vital Signs: BP: 108/71 (03/05 0556) Pulse Rate: 100 (03/05 0556)  Labs: Recent Labs    06/08/19 1127 06/08/19 1127 06/08/19 1623 06/08/19 1842 06/08/19 1900 06/09/19 0645 06/09/19 0650  HGB 12.2   < >  --  11.7*  --  10.9*  --   HCT 37.7  --   --  35.2*  --  32.2*  --   PLT 113*  --   --  184  --  184  --   LABPROT  --   --  19.6*  --   --   --   --   INR  --   --  1.7*  --   --   --   --   CREATININE 1.07*  --   --   --  0.99  --  0.90   < > = values in this interval not displayed.    Estimated Creatinine Clearance: 60.1 mL/min (by C-G formula based on SCr of 0.9 mg/dL).   Assessment: 59 year old female s/p covid infection now with bilateral DVTs to begin Lovenox  Scr 1.07  Goal of Therapy:  Anti-Xa level 0.6-1 units/ml 4hrs after LMWH dose given Monitor platelets by anticoagulation protocol: Yes   Plan:  Lovenox 65 mg sq Q 12 hours Follow up CBC  Thank you Okey Regal, PharmD [phone]  06/09/2019,2:56 PM

## 2019-06-09 NOTE — Progress Notes (Signed)
PROGRESS NOTE  [name]  DOB: [birth year]  PCP: Tonia Ghent, MD RFF:[id number]  DOA: 06/08/2019 Admitted From: Home  LOS: 1 day   Chief Complaint  Patient presents with  . Shortness of Breath  . Leg Swelling   Brief narrative: Abigail [name] is a 59 y.o. female with past medical history of alcoholism, chronic pancreatitis, gastric bypass surgery 2005, hypertension, portal vein thrombosis 2019, Covid 19 infection in December 2020. Patient presented to the ED on 3/5 with complaint of worsening bilateral lower extremity edema for last 1 month associated with abdominal swelling, worsening fatigue, poor oral intake. She had gastric bypass surgery done in 2005 at [facility] and has been taking nutritional supplements. She used to be a heavy drinker until a year ago and has chronic pancreatitis, gets intermittent nausea, vomiting and takes pancreatic enzyme supplements.   1 year ago, patient had intractable nausea, vomiting, admitted to [facility] and subsequently transferred to [facility].  CAD underwent EGD, does not recall finding any ulcer or bleeding.  She states she tested positive for H. pylori at that time.  She states she significantly reduced alcohol intake since then and is now an occasional drinker. She lives with her boyfriend at her house and drinks mostly in the weekend.  Last drink was last weekend. CT scan from November 2020 shows extensive fatty liver. In December 2020, patient had COVID-19 infection.  She did not require hospitalization.  She says she received an outpatient infusion, which I'm assuming was Bamlanivimumab.  Since that infection, patient states she gets burning sensation in the mouth with anything.  She has had compromised oral intake, hydration and worsening weakness. Second week of January, patient started noticing bilateral lower extremity swelling which progressively got worse and spread up to the abdomen.  She has also noticed progressive fatigue and worsening oral intake.   She has noticed bright red blood in toilet paper in few occasions.  No vomiting of blood. No fever, chest pain, shortness of breath, focal neurological deficit.  In the ED, patient is afebrile, heart rate in 80s, blood pressure in normal range, oxygen saturation 100% on room air.  Blood work showed sodium 127, potassium 5.2, serum bicarb 19, creatinine 1.07,  albumin 1.5, AST ALT/alk phos/total bili/ammonia respectively at 65/29/131/1 0.9/35, INR 1.7 WBC count 8.5, hemoglobin 12.2, platelets 113, Chest x-ray normal Ultrasound right upper quadrant abdomen showed fatty liver with findings suggestive of early cirrhosis.  Small ascites.  Patient was admitted under hospitalist service for further evaluation and management.  Subjective: Patient was seen and examined this afternoon.  Pleasant middle-aged Caucasian female.  Feels better than yesterday.  Feels that her legs are getting lighter.  Still does not have appetite.  Was able to take few bites of her food.  Assessment/Plan: Generalized weakness -Multifactorial: Liver cirrhosis, chronic pancreatitis, poor appetite, low electrolytes, recent COVID-19 infection -See below for the details of each.  Does not seem to have any infection at this time -Needs to encourage oral appetite, PT eval. -Feels little better today. -Pending vitamin D, B12 and ferritin level.  History of portal venous thrombosis in 2019 Factor V Leyden disease -Patient mentioned  to me today that she has a history of factor V Leyden disease and had portal vein thrombosis in 2019.  She saw a hematologist at [facility] and remained on Eliquis for several months.  She was taken off Eliquis eventually. -Ultrasound duplex of lower extremities obtained today show bilateral DVT (pending formal report).  I would  start the patient on full dose Lovenox at this time.  Plan to switch to Eliquis at discharge.  Watch out for bleeding episodes.  Alcoholic liver disease -History of chronic  alcoholism.  Extensive fatty liver and CT scan from November 2020.  Ultrasound abdomen with findings of early liver cirrhosis. -Liver enzymes are slightly abnormal.  INR elevated at 1.7.  Albumin significantly low at 1.5 -Ultrasound abdomen does not show evidence of portal hypertension, only mild ascites.   -Clinically has 1+ bilateral pedal edema.  No evidence of SBP. -Improved with Lasix 20 mg IV given yesterday.  I will start the patient on Lasix 40 mg IV twice daily.  Monitor urine output, electrolyte levels.  May need low-dose Lasix at discharge. -she will benefit from GI evaluation as an outpatient.  No evidence of bleeding at this time.  Chronic alcoholism -Reports of alcoholism ~year ago. -Occasional alcohol user now.  Counseled to quit.  However, uncertain of reliability.  Currently on CIWA protocol with as needed Ativan.  No evidence of withdrawal symptoms at this time.  Hyponatremia/hyperkalemia -Suspect hypervolemic hyponatremia.  -On admission, sodium was 127, serum osmolality 272,  urine osmolality also low at 223.   -May also be partly secondary to poor oral intake. -Currently on IV Lasix, fluid restriction -Sodium level improved slightly to 129 today. -Potassium level was 5.2 on admission, 5 today.  On IV Lasix.  Low albumin Protein calorie malnutrition -Albumin level low <1.5.  Her baseline albumin level already seems low between 2-2.5.  Likely related to liver disease and poor intake. -Nutrition consulted.  History of gastric bypass surgery 2005 -Probably followed by nutritional deficiencies.  -Pending levels of vitamin D, vitamin B12, folate, ferritin. -Macrocytosis could be due to alcohol, liver disease or deficiencies.  Chronic pancreatitis -Due to alcohol.  Treated with intermittent nausea, vomiting.  Uses pancreatic enzymes at home.  Continue the same.  Recent COVID-19 infection -Did not need hospitalization at that time.  She took outpatient monoclonal  antibody infusion. -The only residual symptom seems to be burning sensation in the mouth with any food.  DVT prophylaxis: Lovenox subcu full dose Antimicrobials: None Fluid: None Diet:  Low-sodium diet, as tolerated  Code Status: Full code Mobility: PT eval obtained Family Communication: Not at bedside Discharge plan:  Anticipated date: At least 2 to 3 days in the hospital  disposition: Likely home  Consultants:  None yet  Antimicrobials: Anti-infectives (From admission, onward)   None        Code Status: Full Code   Diet Order            Diet 2 gram sodium Room service appropriate? Yes; Fluid consistency: Thin  Diet effective now              Infusions:    Scheduled Meds: . feeding supplement  1 Container Oral TID BM  . feeding supplement (PRO-STAT SUGAR FREE 64)  30 mL Oral TID  . folic acid  1 mg Oral Daily  . furosemide  40 mg Intravenous Daily  . multivitamin with minerals  1 tablet Oral Daily  . senna  1 tablet Oral BID  . sodium chloride flush  3 mL Intravenous Q12H  . thiamine  100 mg Oral Daily   Or  . thiamine  100 mg Intravenous Daily    PRN meds: LORazepam **OR** LORazepam, magnesium hydroxide, ondansetron **OR** ondansetron (ZOFRAN) IV, sodium phosphate, sorbitol   Objective: Vitals:   06/09/19 0206 06/09/19 0556  BP: 114/78 108/71  Pulse: 100 100  Resp: 18 18  Temp: 98.6 F (37 C)   SpO2: 99% 100%   No intake or output data in the 24 hours ending 06/09/19 1454 Filed Weights   06/08/19 1105  Weight: 68 kg   Weight change:  Body mass index is 29.29 kg/m.   Physical Exam: General exam: Appears calm and comfortable.  Skin: No rashes, lesions or ulcers. HEENT: Atraumatic, normocephalic, supple neck, no obvious bleeding Lungs: Clear to auscultation bilaterally CVS: Rate and rhythm, no murmur GI/Abd soft, nontender, nondistended, bowel sound present CNS: Alert, awake, oriented x3 Psychiatry: Mood appropriate Extremities:  Pedal edema improving, skin puckering seen.  No calf tenderness noted.  Data Review: I have personally reviewed the laboratory data and studies available.  Recent Labs  Lab 06/08/19 1127 06/08/19 1842 06/09/19 0645  WBC 8.5 9.5 9.1  HGB 12.2 11.7* 10.9*  HCT 37.7 35.2* 32.2*  MCV 102.4* 98.6 97.9  PLT 113* 184 184   Recent Labs  Lab 06/08/19 1127 06/08/19 1900 06/09/19 0650  NA 127* 127* 129*  K 5.2* 4.8 5.0  CL 96* 95* 96*  CO2 19* 23 24  GLUCOSE 91 90 75  BUN '8 8 9  ' CREATININE 1.07* 0.99 0.90  CALCIUM 7.7* 7.8* 7.6*  MG  --  1.8  --   PHOS  --  3.2  --     Signed, Terrilee Croak, MD [facility] Pager: [phone] (Secure Chat preferred). 06/09/2019

## 2019-06-09 NOTE — Progress Notes (Signed)
Bilateral lower extremity venous duplex completed. Refer to "CV Proc" under chart review to view preliminary results.  Preliminary results discussed with Dr. Pola Corn.  06/09/2019 2:19 PM Eula Fried., MHA, RVT, RDCS, RDMS

## 2019-06-09 NOTE — Progress Notes (Signed)
PT Progress Note for Charges    06/09/19 1300  PT Visit Information  Last PT Received On 06/09/19  PT General Charges  $$ ACUTE PT VISIT 1 Visit  PT Evaluation  $PT Eval Low Complexity 1 Low  Ginette Pitman, PT, DPT  Acute Rehabilitation Services Pager [phone] Office [phone]

## 2019-06-09 NOTE — Progress Notes (Signed)
Initial Nutrition Assessment  DOCUMENTATION CODES:   Not applicable  INTERVENTION:   -Boost Breeze po TID, each supplement provides 250 kcal and 9 grams of protein -30 ml Prostat TID with meals, each supplement provides 100 kcals and 15 grams protein -Continue MVI with minerals daily  NUTRITION DIAGNOSIS:   Increased nutrient needs related to chronic illness(alcoholic liver disease) as evidenced by estimated needs.  GOAL:   Patient will meet greater than or equal to 90% of their needs  MONITOR:   PO intake, Supplement acceptance, Labs, Weight trends, Skin, I & O's  REASON FOR ASSESSMENT:   Consult Assessment of nutrition requirement/status  ASSESSMENT:   Abigail [name] is a 59 y.o. female with past medical history of alcoholism, chronic pancreatitis, gastric bypass surgery 2005, hypertension, portal vein thrombosis 2019, Covid 19 infection in December 2020.Patient presented to the ED today with complaint of worsening bilateral lower extremity edema for last 1 month associated with abdominal swelling, worsening fatigue, poor oral intake.She had gastric bypass surgery done in 2005 at [facility] and has been taking nutritional supplements. She used to be a heavy drinker until a year ago and has chronic pancreatitis, gets intermittent nausea, vomiting and takes pancreatic enzyme supplements.  Pt admitted with generalized weakness and alcoholic liver disease.   Spoke with pt, who was sitting in recliner chair at time of visit. She shares that she has had a difficult year, as she was hospitalized with H Pylori at [facility] approximately one year ago and was diagnosed with COVID-19 in December 2020. Pt shares that her appetite has been decreased secondary to H Pylori, however, further declined when she had COVID secondary to painful throat. Over the past 3 weeks, pt with early satiety, usually consuming a few bites of food at each sitting. She shares that she has been consuming only  vegetables and pasta noodles.   Pt shares that she had had a history of roux en y gastric bypass surgery, which she had at [facility] in 2005. She reports doing very well after this surgery and had a UBW is around 170-180#. She estimates she has lost 20-30 pounds over the past 3 months, however, weight loss has been masked secondary to edema. Noted pt has experienced a 6.5% wt loss over the past 11 months., which is not significant for time frame. Pt confirms taking MVI and calcium with vitamin D.   Pt consumed only a few bites of pancakes and sausage. Discussed options for supplements; pt amenable to [employer] and Prostat.   Medications reviewed and include folvite and thiamine.    Albumin has a half-life of 21 days and is strongly affected by stress response and inflammatory process, therefore, do not expect to see an improvement in this lab value during acute hospitalization. When a patient presents with low albumin, it is likely skewed due to the acute inflammatory response.  Unless it is suspected that patient had poor PO intake or malnutrition prior to admission, then RD should not be consulted solely for low albumin. Note that low albumin is no longer used to diagnose malnutrition; [facility] uses the new malnutrition guidelines published by the American Society for Parenteral and Enteral Nutrition (A.S.P.E.N.) and the Academy of Nutrition and Dietetics (AND).    Labs reviewed: Na: 129.   NUTRITION - FOCUSED PHYSICAL EXAM:    Most Recent Value  Orbital Region  Mild depletion  Upper Arm Region  No depletion  Thoracic and Lumbar Region  No depletion  Buccal Region  No depletion  Temple Region  Mild depletion  Clavicle Bone Region  No depletion  Clavicle and Acromion Bone Region  No depletion  Scapular Bone Region  No depletion  Dorsal Hand  No depletion  Patellar Region  No depletion  Anterior Thigh Region  No depletion  Posterior Calf Region  No depletion  Edema (RD Assessment)   Moderate  Hair  Reviewed  Eyes  Reviewed  Mouth  Reviewed  Skin  Reviewed  Nails  Reviewed       Diet Order:   Diet Order            Diet 2 gram sodium Room service appropriate? Yes; Fluid consistency: Thin  Diet effective now              EDUCATION NEEDS:   Education needs have been addressed  Skin:  Skin Assessment: Skin Integrity Issues: Skin Integrity Issues:: Other (Comment) Other: serous blister on rt and lt shins  Last BM:  06/06/19  Height:   Ht Readings from Last 1 Encounters:  06/08/19 5' (1.524 m)    Weight:   Wt Readings from Last 1 Encounters:  06/08/19 68 kg    Ideal Body Weight:  45.5 kg  BMI:  Body mass index is 29.29 kg/m.  Estimated Nutritional Needs:   Kcal:  1850-2050  Protein:  90-105 grams  Fluid:  1.5 L    Levada Schilling, RD, LDN, CDCES Registered Dietitian II Certified Diabetes Care and Education Specialist Please refer to [facility] for RD and/or RD on-call/weekend/after hours pager

## 2019-06-09 NOTE — Plan of Care (Signed)
  Problem: Education: Goal: Knowledge of General Education information will improve Description Including pain rating scale, medication(s)/side effects and non-pharmacologic comfort measures Outcome: Progressing   

## 2019-06-09 NOTE — Progress Notes (Signed)
Patient admitted to 6N32 from ED. Alert and oriented x4. Oriented to room and remote.Will monitor.

## 2019-06-09 NOTE — Progress Notes (Signed)
Physical Therapy Evaluation   Clinical Impression: Abigail [name] is a 59 y.o. female who presents with pitting edema in B LE's and feet. Prior to admission pt reports being completely ind in ADL's and mobility. Currently, pt is mod ind for bed mobility and min guard for transfers and ambulation. She reports heaviness in LE's making it difficult to move her LE's and ambulate. She was able to walk ~31ft with shortened step length and 1-hand assist, her HR inc from 110-140 and slight SOB was noted, upon sitting in chair, vitals returned to previous levels. Pt was left in chair with call bell and educated on importance of sitting and elevating LE's to dec edema. 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 Acute PT will follow to progress mobility and strength in preparation to returning home.    06/09/19 0900  PT Visit Information  Last PT Received On 06/09/19  Assistance Needed +1  History of Present Illness Patient is a 59 y.o. female who presents with PMH of alcoholism, HTN, chronic pancreatitis, portal vein thrombosis, and COVID-19 infection (Dec 2020).  Pt was admitted due to edema in B LE's and feet potentially due to liver cirrhosis, chronic pancreatitis, poor appetite, low electrolytes, and recent COVID-19 infection    Precautions  Precautions None  Restrictions  Weight Bearing Restrictions No  Home Living  Family/patient expects to be discharged to: Private residence  Living Arrangements Spouse/significant other  Available Help at Discharge Family  Type of [place] to enter  Entrance Stairs-Number of Steps 3  Entrance Stairs-Rails Can reach both  [place] One level  Bathroom Shower/Tub Tub/shower unit;Walk-in Cytogeneticist Yes  Home Equipment None  Additional Comments Scientist, physiological  Prior Function  Level  of Independence Independent  Communication  Communication No difficulties  Pain Assessment  Pain Assessment 0-10  Pain Score 6  Pain Location B LE's and feet  Pain Descriptors / Indicators Aching;Heaviness  Pain Intervention(s) Repositioned  Cognition  Arousal/Alertness Awake/alert  Behavior During Therapy WFL for tasks assessed/performed  Overall Cognitive Status Within Functional Limits for tasks assessed  Upper Extremity Assessment  Upper Extremity Assessment Overall WFL for tasks assessed  Lower Extremity Assessment  Lower Extremity Assessment RLE deficits/detail;LLE deficits/detail  RLE Deficits / Details pitting edema B: L>R foot. B Hip flex 4/5, B knee flex 5/5, DF grossly 3/5 (difficult to assess d/t edema)   RLE Sensation decreased light touch (R foot )  RLE Coordination decreased gross motor (d/t edema)  LLE Deficits / Details pitting edema B: L>R foot. B Hip flex 4/5, B knee flex 5/5, DF grossly 3/5 (difficult to assess d/t edema)   LLE Sensation decreased light touch (L>R)  LLE Coordination decreased gross motor (d/t edema)  Bed Mobility  Overal bed mobility Modified Independent  General bed mobility comments used bedrail. HR 124 upon sit  Transfers  Overall transfer level Needs assistance  Equipment used 1 person hand held assist  Transfers Sit to/from Stand  Sit to Stand Min guard  General transfer comment inc forward trunk lean   Ambulation/Gait  Ambulation/Gait assistance Min guard  Gait Distance (Feet) 20 Feet  Assistive device 1 person hand held assist  Gait Pattern/deviations Step-through pattern;Decreased stride length  General Gait Details Pt demonstrates short step length B. Initially pt required 1-hand assist and frequently reaches for furntiure support. Pt stated  heaviness in LE's and abdomen impaired mobility.    Balance  Overall balance assessment Modified Independent  General Comments  General comments (skin integrity, edema, etc.) B LE pitting  edema L>R, redness on R LE. HR inc from 110 to 140 upon ambulation, some SOB noted   PT - End of Session  Equipment Utilized During Treatment Gait belt  Activity Tolerance Patient tolerated treatment well  Patient left in chair;with call bell/phone within reach  Nurse Communication Mobility status  PT Assessment  PT Recommendation/Assessment Patient needs continued PT services  PT Visit Diagnosis Other abnormalities of gait and mobility (R26.89)  PT Problem List Decreased strength;Decreased activity tolerance;Decreased mobility;Decreased skin integrity  PT Plan  PT Frequency (ACUTE ONLY) Min 3X/week  PT Treatment/Interventions (ACUTE ONLY) Gait training;Functional mobility training;Therapeutic activities;Therapeutic exercise;Balance training;Manual techniques  AM-PAC PT "6 Clicks" Mobility Outcome Measure (Version 2)  Help needed turning from your back to your side while in a flat bed without using bedrails? 3  Help needed moving from lying on your back to sitting on the side of a flat bed without using bedrails? 3  Help needed moving to and from a bed to a chair (including a wheelchair)? 3  Help needed standing up from a chair using your arms (e.g., wheelchair or bedside chair)? 3  Help needed to walk in hospital room? 3  Help needed climbing 3-5 steps with a railing?  3  6 Click Score 18  Consider Recommendation of Discharge To: Home with [facility]  PT Recommendation  Follow Up Recommendations Home health PT  PT equipment Cane  Individuals Consulted  Consulted and Agree with Results and Recommendations Patient  Acute Rehab PT Goals  Patient Stated Goal Decrease edema to improve ambulation and get stronger  PT Goal Formulation With patient  Time For Goal Achievement 06/23/19  Potential to Achieve Goals Good  PT Time Calculation  PT Start Time (ACUTE ONLY) 0927  PT Stop Time (ACUTE ONLY) 0942  PT Time Calculation (min) (ACUTE ONLY) 15 min  Written Expression  Dominant Hand Right    Wyatt Portela, SPT Acute Rehab  [phone]

## 2019-06-10 LAB — COMPREHENSIVE METABOLIC PANEL
ALT: 23 U/L (ref 0–44)
AST: 44 U/L — ABNORMAL HIGH (ref 15–41)
Albumin: 1.4 g/dL — ABNORMAL LOW (ref 3.5–5.0)
Alkaline Phosphatase: 122 U/L (ref 38–126)
Anion gap: 7 (ref 5–15)
BUN: 10 mg/dL (ref 6–20)
CO2: 25 mmol/L (ref 22–32)
Calcium: 7.6 mg/dL — ABNORMAL LOW (ref 8.9–10.3)
Chloride: 95 mmol/L — ABNORMAL LOW (ref 98–111)
Creatinine, Ser: 1.14 mg/dL — ABNORMAL HIGH (ref 0.44–1.00)
GFR calc Af Amer: >60 mL/min (ref >60)
GFR calc non Af Amer: 54 mL/min — ABNORMAL LOW (ref >60)
Glucose, Bld: 102 mg/dL — ABNORMAL HIGH (ref 70–99)
Potassium: 4.9 mmol/L (ref 3.5–5.1)
Sodium: 127 mmol/L — ABNORMAL LOW (ref 135–145)
Total Bilirubin: 1.7 mg/dL — ABNORMAL HIGH (ref 0.3–1.2)
Total Protein: 5.6 g/dL — ABNORMAL LOW (ref 6.5–8.1)

## 2019-06-10 LAB — FERRITIN: Ferritin: 300 ng/mL (ref 11–307)

## 2019-06-10 LAB — CBC
HCT: 30.8 % — ABNORMAL LOW (ref 36.0–46.0)
Hemoglobin: 10.6 g/dL — ABNORMAL LOW (ref 12.0–15.0)
MCH: 33.7 pg (ref 26.0–34.0)
MCHC: 34.4 g/dL (ref 30.0–36.0)
MCV: 97.8 fL (ref 80.0–100.0)
Platelets: 234 10*3/uL (ref 150–400)
RBC: 3.15 MIL/uL — ABNORMAL LOW (ref 3.87–5.11)
RDW: 15.2 % (ref 11.5–15.5)
WBC: 10.4 10*3/uL (ref 4.0–10.5)
nRBC: 0.3 % — ABNORMAL HIGH (ref 0.0–0.2)

## 2019-06-10 LAB — VITAMIN D 25 HYDROXY (VIT D DEFICIENCY, FRACTURES): Vit D, 25-Hydroxy: 14.98 ng/mL — ABNORMAL LOW (ref 30–100)

## 2019-06-10 LAB — VITAMIN B12: Vitamin B-12: 3273 pg/mL — ABNORMAL HIGH (ref 180–914)

## 2019-06-10 LAB — FOLATE: Folate: 5.5 ng/mL — ABNORMAL LOW (ref >5.9)

## 2019-06-10 MED ORDER — APIXABAN 5 MG PO TABS: 5.0000 mg | ORAL_TABLET | Freq: Two times a day (BID) | ORAL | Status: DC

## 2019-06-10 MED ORDER — METOPROLOL TARTRATE 50 MG PO TABS
50.0000 mg | ORAL_TABLET | Freq: Two times a day (BID) | ORAL | Status: DC
  Administered 2019-06-10 – 2019-06-11 (×3): 50 mg via ORAL
  Filled 2019-06-10 (×3): qty 1

## 2019-06-10 MED ORDER — APIXABAN 5 MG PO TABS
10.0000 mg | ORAL_TABLET | Freq: Two times a day (BID) | ORAL | Status: DC
  Administered 2019-06-10 – 2019-06-11 (×2): 10 mg via ORAL
  Filled 2019-06-10 (×3): qty 2

## 2019-06-10 MED ORDER — GABAPENTIN 300 MG PO CAPS
600.0000 mg | ORAL_CAPSULE | Freq: Two times a day (BID) | ORAL | Status: DC
  Administered 2019-06-10 – 2019-06-11 (×3): 600 mg via ORAL
  Filled 2019-06-10 (×3): qty 2

## 2019-06-10 MED ORDER — PANTOPRAZOLE SODIUM 40 MG PO TBEC
40.0000 mg | DELAYED_RELEASE_TABLET | Freq: Every day | ORAL | Status: DC
  Administered 2019-06-10 – 2019-06-11 (×2): 40 mg via ORAL
  Filled 2019-06-10 (×2): qty 1

## 2019-06-10 MED ORDER — CALCIUM CARBONATE-VITAMIN D 500-200 MG-UNIT PO TABS
2.0000 | ORAL_TABLET | Freq: Every day | ORAL | Status: DC
  Administered 2019-06-11: 2 via ORAL
  Filled 2019-06-10: qty 2

## 2019-06-10 MED ORDER — VENLAFAXINE HCL 75 MG PO TABS
75.0000 mg | ORAL_TABLET | Freq: Two times a day (BID) | ORAL | Status: DC
  Administered 2019-06-10 (×2): 75 mg via ORAL
  Filled 2019-06-10 (×3): qty 1

## 2019-06-10 NOTE — Discharge Instructions (Signed)
Information on my medicine - ELIQUIS (apixaban)  This medication education was reviewed with me or my healthcare representative as part of my discharge preparation.  The pharmacist that spoke with me during my hospital stay was:  Anan Dapolito Z  Shaqueta Casady,, RPH  Why was Eliquis prescribed for you? Eliquis was prescribed to treat blood clots that may have been found in the veins of your legs (deep vein thrombosis) or in your lungs (pulmonary embolism) and to reduce the risk of them occurring again.  What do You need to know about Eliquis ? The starting dose is 10 mg (two 5 mg tablets) taken TWICE daily for the FIRST SEVEN (7) DAYS, then on (enter date)  06/17/19 in the evening  the dose is reduced to ONE 5 mg tablet taken TWICE daily.  Eliquis may be taken with or without food.   Try to take the dose about the same time in the morning and in the evening. If you have difficulty swallowing the tablet whole please discuss with your pharmacist how to take the medication safely.  Take Eliquis exactly as prescribed and DO NOT stop taking Eliquis without talking to the doctor who prescribed the medication.  Stopping may increase your risk of developing a new blood clot.  Refill your prescription before you run out.  After discharge, you should have regular check-up appointments with your healthcare provider that is prescribing your Eliquis.    What do you do if you miss a dose? If a dose of ELIQUIS is not taken at the scheduled time, take it as soon as possible on the same day and twice-daily administration should be resumed. The dose should not be doubled to make up for a missed dose.  Important Safety Information A possible side effect of Eliquis is bleeding. You should call your healthcare provider right away if you experience any of the following: ? Bleeding from an injury or your nose that does not stop. ? Unusual colored urine (red or dark brown) or unusual colored stools (red or  black). ? Unusual bruising for unknown reasons. ? A serious fall or if you hit your head (even if there is no bleeding).  Some medicines may interact with Eliquis and might increase your risk of bleeding or clotting while on Eliquis. To help avoid this, consult your healthcare provider or pharmacist prior to using any new prescription or non-prescription medications, including herbals, vitamins, non-steroidal anti-inflammatory drugs (NSAIDs) and supplements.  This website has more information on Eliquis (apixaban): http://www.eliquis.com/eliquis/home

## 2019-06-10 NOTE — Progress Notes (Signed)
PROGRESS NOTE  Abigail [name]  DOB: 21-Aug-1960  PCP: Tonia Ghent, MD WVP:[id number]  DOA: 06/08/2019 Admitted From: Home  LOS: 2 days   Chief Complaint  Patient presents with  . Shortness of Breath  . Leg Swelling   Brief narrative: Abigail [name] is a 59 y.o. female with past medical history of alcoholism, chronic pancreatitis, gastric bypass surgery 2005, hypertension, portal vein thrombosis 2019, Covid 19 infection in December 2020. Patient presented to the ED on 3/5 with complaint of worsening bilateral lower extremity edema for last 1 month associated with abdominal swelling, worsening fatigue, poor oral intake. She had gastric bypass surgery done in 2005 at [facility] and has been taking nutritional supplements. She used to be a heavy drinker until a year ago and has chronic pancreatitis, gets intermittent nausea, vomiting and takes pancreatic enzyme supplements.   1 year ago, patient had intractable nausea, vomiting, admitted to [facility] and subsequently transferred to [facility].  CAD underwent EGD, does not recall finding any ulcer or bleeding.  She states she tested positive for H. pylori at that time.  She states she significantly reduced alcohol intake since then and is now an occasional drinker. She lives with her boyfriend at her house and drinks mostly in the weekend.  Last drink was last weekend. CT scan from November 2020 shows extensive fatty liver. In December 2020, patient had COVID-19 infection.  She did not require hospitalization.  She says she received an outpatient infusion, which I'm assuming was Bamlanivimumab.  Since that infection, patient states she gets burning sensation in the mouth with anything.  She has had compromised oral intake, hydration and worsening weakness. Second week of January, patient started noticing bilateral lower extremity swelling which progressively got worse and spread up to the abdomen.  She has also noticed progressive fatigue and worsening oral  intake.  She has noticed bright red blood in toilet paper in few occasions.  No vomiting of blood. No fever, chest pain, shortness of breath, focal neurological deficit.  In the ED, patient is afebrile, heart rate in 80s, blood pressure in normal range, oxygen saturation 100% on room air.  Blood work showed sodium 127, potassium 5.2, serum bicarb 19, creatinine 1.07,  albumin 1.5, AST ALT/alk phos/total bili/ammonia respectively at 65/29/131/1 0.9/35, INR 1.7 WBC count 8.5, hemoglobin 12.2, platelets 113, Chest x-ray normal Ultrasound right upper quadrant abdomen showed fatty liver with findings suggestive of early cirrhosis.  Small ascites.  Patient was admitted under hospitalist service for further evaluation and management.  Subjective: Patient was seen and examined this afternoon.  Pleasant middle-aged Caucasian female.  Lying on bed.  Still does not have good appetite.  Able to ambulate with minimal assistance.  Labs reviewed.  Sodium level back down to 127 again, creatinine starts to rise up.  Assessment/Plan: Generalized weakness -Multifactorial: Liver cirrhosis, chronic pancreatitis, poor appetite, low electrolytes, recent COVID-19 infection -See below for the details of each.  Does not seem to have any infection at this time -Needs to encourage oral appetite, PT eval. -Only having gradual improvement.  Still with poor appetite.  History of portal venous thrombosis in 2019 Factor V Leyden disease -Patient mentioned that she has a history of factor V Leyden disease and had portal vein thrombosis in 2019.  She saw a hematologist at [facility] and remained on Eliquis for several months.  She was taken off Eliquis eventually. -Ultrasound duplex of lower extremities obtained today show bilateral DVT.  Right popliteal DVT and left common  femoral and left femoral vein DVT.   -Switched from Lovenox to Eliquis this morning. Watch out for bleeding episodes.  Alcoholic liver  disease -History of chronic alcoholism.  Extensive fatty liver and CT scan from November 2020.  Ultrasound abdomen with findings of early liver cirrhosis. -Liver enzymes are slightly abnormal.  INR elevated at 1.7.  Albumin significantly low at 1.5 -Ultrasound abdomen does not show evidence of portal hypertension, only mild ascites.   -Clinically has 1+ bilateral pedal edema.  No evidence of SBP. -Improved with few doses of IV Lasix, last being yesterday.  Creatinine is up today.  I will stop all IV Lasix at this time.   -she will benefit from GI evaluation as an outpatient.  No evidence of bleeding at this time.  Chronic alcoholism -Reports of alcoholism until a year ago. -Occasional alcohol user now.  Counseled to quit.  However, uncertain of reliability.  Currently on CIWA protocol with as needed Ativan.  No evidence of withdrawal symptoms at this time.  Hyponatremia/hyperkalemia -Suspect hypervolemic hyponatremia.  -On admission, sodium was 127, serum osmolality 272,  urine osmolality also low at 223.   -May also be partly secondary to poor oral intake. -Initially with IV Lasix, sodium level improved to 129.  Patient has poor appetite. -Sodium level is now dropping again, 127 today. -Potassium level was 5.2 on admission, 4.9 today.  Low albumin Protein calorie malnutrition -Albumin level low <1.5.  Her baseline albumin level already seems low between 2-2.5.  Likely related to liver disease and poor intake. -Nutrition consulted.  History of gastric bypass surgery 2005 -Probably followed by nutritional deficiencies.  -Levels checked. Vitamin D level is low at 15 however ferritin, vitamin B12 and folate level normal to high.  -Start on vitamin D supplementation -Macrocytosis could be due to alcohol   Chronic pancreatitis -Due to alcohol. Treated with intermittent nausea, vomiting.  Uses pancreatic enzymes at home.  Continue the same.  Recent COVID-19 infection -Did not need  hospitalization at that time.  She took outpatient monoclonal antibody infusion. -The only residual symptom seems to be burning sensation in the mouth with any food.  DVT prophylaxis: Lovenox subcu full dose Antimicrobials: None Fluid: None Diet:  Low-sodium diet, as tolerated  Code Status: Full code Mobility: PT eval obtained Family Communication: Not at bedside Discharge plan:  Anticipated date:  If electrolyte levels are better tomorrow, patient may be discharged to home.   Disposition: Likely home  Consultants:  None yet  Antimicrobials: Anti-infectives (From admission, onward)   None        Code Status: Full Code   Diet Order            Diet 2 gram sodium Room service appropriate? Yes; Fluid consistency: Thin  Diet effective now              Infusions:    Scheduled Meds: . apixaban  10 mg Oral BID   Followed by  . [START ON 06/17/2019] apixaban  5 mg Oral BID  . feeding supplement  1 Container Oral TID BM  . feeding supplement (PRO-STAT SUGAR FREE 64)  30 mL Oral TID  . folic acid  1 mg Oral Daily  . gabapentin  600 mg Oral BID  . metoprolol tartrate  50 mg Oral BID  . multivitamin with minerals  1 tablet Oral Daily  . pantoprazole  40 mg Oral Daily  . senna  1 tablet Oral BID  . sodium chloride flush  3 mL  Intravenous Q12H  . thiamine  100 mg Oral Daily  . venlafaxine  75 mg Oral BID    PRN meds: LORazepam **OR** LORazepam, magnesium hydroxide, ondansetron **OR** ondansetron (ZOFRAN) IV, sodium phosphate, sorbitol, traMADol   Objective: Vitals:   06/09/19 2239 06/10/19 0457  BP: 114/79 103/73  Pulse: (!) 106 (!) 102  Resp: 15 16  Temp: 98.6 F (37 C) 98.3 F (36.8 C)  SpO2: 98% 95%    Intake/Output Summary (Last 24 hours) at 06/10/2019 1211 Last data filed at 06/10/2019 0457 Gross per 24 hour  Intake 720 ml  Output 100 ml  Net 620 ml   Filed Weights   06/08/19 1105  Weight: 68 kg   Weight change:  Body mass index is 29.29  kg/m.   Physical Exam: General exam: Appears calm and comfortable.  Skin: No rashes, lesions or ulcers. HEENT: Atraumatic, normocephalic, supple neck, no obvious bleeding Lungs: Clear to auscultation bilaterally, no crackles or wheezing. CVS: Rate and rhythm, no murmur GI/Abd soft, nontender, nondistended, bowel sound present CNS: Alert, awake, oriented x3 Psychiatry: Mood appropriate Extremities: Pedal edema improving, skin puckering seen.  No calf tenderness noted.  Data Review: I have personally reviewed the laboratory data and studies available.  Recent Labs  Lab 06/08/19 1127 06/08/19 1842 06/09/19 0645 06/10/19 0243  WBC 8.5 9.5 9.1 10.4  HGB 12.2 11.7* 10.9* 10.6*  HCT 37.7 35.2* 32.2* 30.8*  MCV 102.4* 98.6 97.9 97.8  PLT 113* 184 184 234   Recent Labs  Lab 06/08/19 1127 06/08/19 1900 06/09/19 0650 06/10/19 0243  NA 127* 127* 129* 127*  K 5.2* 4.8 5.0 4.9  CL 96* 95* 96* 95*  CO2 19* '23 24 25  ' GLUCOSE 91 90 75 102*  BUN '8 8 9 10  ' CREATININE 1.07* 0.99 0.90 1.14*  CALCIUM 7.7* 7.8* 7.6* 7.6*  MG  --  1.8  --   --   PHOS  --  3.2  --   --     Signed, Terrilee Croak, MD [facility] Pager: [phone] (Secure Chat preferred). 06/10/2019

## 2019-06-10 NOTE — Progress Notes (Addendum)
ANTICOAGULATION CONSULT NOTE - Follow Up Consult  Pharmacy Consult for apixaban Indication: DVT  Allergies  Allergen Reactions  . Latex Rash    "hands turn beat red and start to itch"  . Atorvastatin Other (See Comments)    Swollen joints  . Dilaudid [Hydromorphone Hcl] Itching and Other (See Comments)    Itching- no rash- likely side effect but not true allergy.  Tolerates oxycodone.   [name] [Escitalopram Oxalate] Other (See Comments)    Psychological changes  . Lisinopril Other (See Comments)    Hives, presumed allergy  . Mirtazapine Swelling    swelling  . Nsaids     Gastritis 2013  . Tylenol [Acetaminophen] Other (See Comments)    Hx pancreatitis    Patient Measurements: Height: 5' (152.4 cm) Weight: 150 lb (68 kg) IBW/kg (Calculated) : 45.5  Vital Signs: Temp: 98.3 F (36.8 C) (03/06 0457) Temp Source: Oral (03/06 0457) BP: 103/73 (03/06 0457) Pulse Rate: 102 (03/06 0457)  Labs: Recent Labs    06/08/19 1127 06/08/19 1623 06/08/19 1842 06/08/19 1842 06/08/19 1900 06/09/19 0645 06/09/19 0650 06/10/19 0243  HGB   < >  --  11.7*   < >  --  10.9*  --  10.6*  HCT   < >  --  35.2*  --   --  32.2*  --  30.8*  PLT   < >  --  184  --   --  184  --  234  LABPROT  --  19.6*  --   --   --   --   --   --   INR  --  1.7*  --   --   --   --   --   --   CREATININE   < >  --   --   --  0.99  --  0.90 1.14*   < > = values in this interval not displayed.    Estimated Creatinine Clearance: 47.4 mL/min (A) (by C-G formula based on SCr of 1.14 mg/dL (H)).   Assessment: 59 year old female s/p covid infection now with bilateral DVTs. PMH significant for portal vein thrombosis and Factor V Leiden. Pt has been on apixaban in the past but was taken off of it. Pt was started on Lovenox treatment dose yesterday, pharmacy consulted to switch to apixaban.   Age <80, Wt >60 kg, SCr <1.5.   Goal of Therapy:  Monitor platelets by anticoagulation protocol: Yes   Plan:   Discontinue enoxaparin Start apixaban 10 mg PO BID for 7 days, then on 06/17/19 switch to 5 mg PO BID First dose of apixaban to be given at 1700 today Follow up CBC   Thank you Lulu Riding, PharmD PGY1 Pharmacy Resident  Please check AMION for all [facility] Pharmacy phone numbers After 10:00 PM, call Main Pharmacy [phone]  06/10/2019,8:17 AM

## 2019-06-10 NOTE — Progress Notes (Signed)
Transitions of Care Pharmacist Note  Abigail [name] is a 59 y.o. female that has been diagnosed with DVT and will be prescribed Eliquis (apixaban) at discharge.   Patient Education: I provided the following education on 3/6 to the patient: How to take the medication Described what the medication is Signs of bleeding Signs/symptoms of VTE and stroke  Answered their questions  Discharge Medications Plan: The patient wants to have their discharge medications filled by the Transitions of Care pharmacy rather than their usual pharmacy.  The discharge orders pharmacy has been changed to the Transitions of Care pharmacy, the patient will receive a phone call regarding co-pay, and their medications will be delivered by the Transitions of Care pharmacy.    Thank you,   Lulu Riding, PharmD PGY1 Pharmacy Resident  Please check AMION for all [facility] Pharmacy phone numbers After 10:00 PM, call Main Pharmacy [phone]  June 10, 2019

## 2019-06-11 DIAGNOSIS — R0602 Shortness of breath: Secondary | ICD-10-CM | POA: Insufficient documentation

## 2019-06-11 DIAGNOSIS — E878 Other disorders of electrolyte and fluid balance, not elsewhere classified: Secondary | ICD-10-CM

## 2019-06-11 LAB — COMPREHENSIVE METABOLIC PANEL
ALT: 21 U/L (ref 0–44)
AST: 37 U/L (ref 15–41)
Albumin: 1.2 g/dL — ABNORMAL LOW (ref 3.5–5.0)
Alkaline Phosphatase: 117 U/L (ref 38–126)
Anion gap: 6 (ref 5–15)
BUN: 13 mg/dL (ref 6–20)
CO2: 24 mmol/L (ref 22–32)
Calcium: 7.2 mg/dL — ABNORMAL LOW (ref 8.9–10.3)
Chloride: 96 mmol/L — ABNORMAL LOW (ref 98–111)
Creatinine, Ser: 1.02 mg/dL — ABNORMAL HIGH (ref 0.44–1.00)
GFR calc Af Amer: >60 mL/min (ref >60)
GFR calc non Af Amer: >60 mL/min (ref >60)
Glucose, Bld: 88 mg/dL (ref 70–99)
Potassium: 4.3 mmol/L (ref 3.5–5.1)
Sodium: 126 mmol/L — ABNORMAL LOW (ref 135–145)
Total Bilirubin: 1.5 mg/dL — ABNORMAL HIGH (ref 0.3–1.2)
Total Protein: 5.2 g/dL — ABNORMAL LOW (ref 6.5–8.1)

## 2019-06-11 LAB — CBC
HCT: 29.7 % — ABNORMAL LOW (ref 36.0–46.0)
Hemoglobin: 10 g/dL — ABNORMAL LOW (ref 12.0–15.0)
MCH: 33.4 pg (ref 26.0–34.0)
MCHC: 33.7 g/dL (ref 30.0–36.0)
MCV: 99.3 fL (ref 80.0–100.0)
Platelets: 221 10*3/uL (ref 150–400)
RBC: 2.99 MIL/uL — ABNORMAL LOW (ref 3.87–5.11)
RDW: 15.1 % (ref 11.5–15.5)
WBC: 9.9 10*3/uL (ref 4.0–10.5)
nRBC: 0.3 % — ABNORMAL HIGH (ref 0.0–0.2)

## 2019-06-11 MED ORDER — THIAMINE HCL 100 MG PO TABS: 100.0000 mg | ORAL_TABLET | Freq: Every day | ORAL | 1 refills | Status: DC

## 2019-06-11 MED ORDER — FUROSEMIDE 40 MG PO TABS: 40.0000 mg | ORAL_TABLET | Freq: Every day | ORAL | 1 refills | Status: DC

## 2019-06-11 MED ORDER — FOLIC ACID 1 MG PO TABS: 1.0000 mg | ORAL_TABLET | Freq: Every day | ORAL | 1 refills | Status: DC

## 2019-06-11 MED ORDER — APIXABAN 5 MG PO TABS: 10.0000 mg | ORAL_TABLET | Freq: Two times a day (BID) | ORAL | 0 refills | Status: DC

## 2019-06-11 MED ORDER — VITAMIN D (ERGOCALCIFEROL) 1.25 MG (50000 UNIT) PO CAPS: 50000.0000 [IU] | ORAL_CAPSULE | ORAL | 0 refills | Status: DC

## 2019-06-11 MED ORDER — PANTOPRAZOLE SODIUM 40 MG PO TBEC: 40.0000 mg | DELAYED_RELEASE_TABLET | Freq: Every day | ORAL | 5 refills | Status: DC

## 2019-06-11 MED ORDER — APIXABAN 5 MG PO TABS: 5.0000 mg | ORAL_TABLET | Freq: Two times a day (BID) | ORAL | 0 refills | Status: DC

## 2019-06-11 NOTE — Assessment & Plan Note (Signed)
10:00 AM. She likely has pancreatic insufficiency leading to inc peripheral edema/ presumed pulmonary edema.   Will need ER eval and EMS transport.  911 called.  Patient placed on O2 at 2L in the meantime.   10:04 AM Recheck patient.  She feels better on O2 at 2L.  EMS already called. p85, pulse ox 99%  10:09 AM EMS in route on callback to 911.    Patient rechecked in the meantime, without worsening of status, EMS arrived, patient was transported.  See admission and inpatient notes.  At least 30 minutes were devoted to patient care in this encounter (this can potentially include time spent reviewing the patient's file/history, interviewing and examining the patient, counseling/reviewing plan with patient, ordering referrals, ordering tests, reviewing relevant laboratory or x-ray data, and documenting the encounter).

## 2019-06-11 NOTE — Progress Notes (Signed)
Pt ready for DC.  All DC instructions given and reviewed with pt.  Reviewed how to take Eliquis.  Pt understands HH PT.  Resource sheet given.

## 2019-06-11 NOTE — TOC Transition Note (Signed)
Transition of Care [facility]) - CM/SW Discharge Note   Patient Details  Name: Abigail [name] MRN: [id number] Date of Birth: 02/10/1961  [facility]) CM/SW Contact:  Deveron Furlong, RN 06/11/2019, 10:30 AM   Clinical Narrative:    Patient given Eliquis 30 day free and copay coupons.  HT pharmacy opens at 11 so copay not checked. Patient states she will check and ensure she has a dose to take this evening.  Patient given substance abuse inpatient and outpatient resources.  Patient is receptive.  Patient very drowsy with slurred speech as we are talking.  RN is aware. Patient states her boyfriend will pick her up.   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  She is agreeable to [facility] PT and has no preference of agency.  Referral accepted by [facility] with [facility].  He states patients with this insurance do not have to be homebound and so services should be able to be provided.    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

## 2019-06-11 NOTE — Discharge Summary (Signed)
Physician Discharge Summary  Abigail HippoSandra G [name] ZOX:[id number] DOB: 1961-02-24 DOA: 06/08/2019  PCP: Joaquim Namuncan, Graham S, MD  Admit date: 06/08/2019 Discharge date: 06/11/2019  Admitted From: Home Disposition:  Home  Discharge Condition:Stable CODE STATUS:FULL Diet recommendation: Heart Healthy  Brief/Interim Summary: Abigail IshiharaSandra G [name] a 59 y.o.femalewith past medical history of alcoholism,chronic pancreatitis, gastric bypass surgery 2005, hypertension, portal vein thrombosis 2019, Covid 19 infection in December 2020. Patient presented to the ED on 3/5 with complaint of worsening bilateral lower extremity edema for last 1 month associated with abdominal swelling, worsening fatigue, poor oral intake. In the ED, she was hemodynamically stable Ultrasound right upper quadrant abdomen showed fatty liver with findings suggestive of early cirrhosis.Small ascites. Venous duplex was positive for bilateral DVT.  She was started on Eliquis.  She was seen by physical therapy and recommended home health.  She has been started on Lasix for bilateral lower extremity edema.  She is hemodynamically stable for discharge home today.  Following problems were addressed during hospitalization:  Generalized weakness -Multifactorial:Liver cirrhosis, chronic pancreatitis, poor appetite, low electrolytes, recent COVID-19 infection -PT recommended home health.  History of portal venous thrombosis in 2019 Factor V Leyden disease -Patient mentioned that she has a history of factor V Leyden disease and had portal vein thrombosis in 2019.  She saw a hematologist at [facility] and remained on Eliquis for several months.  She was taken off Eliquis eventually. -Ultrasound duplex of lower extremities obtained today show bilateral DVT.  Right popliteal DVT and left common femoral and left femoral vein DVT.   -Switched from Lovenox to Eliquis   Alcoholic liver disease -History of chronic alcoholism. Extensive fatty liver and  CT scan from November 2020. Ultrasound abdomen with findings of early liver cirrhosis. -Liver enzymes are slightly abnormal. INR elevated at 1.7. Albumin significantly low at 1.5 -Ultrasound abdomen does not show evidence of portal hypertension,only mild ascites.  -Clinically has 1+ bilateral pedal edema. No evidence of SBP. -Improved with few doses of IV Lasix,switched to oral -shewill benefit from GI evaluation as an outpatient.  No evidence of bleeding at this time.  Chronic alcoholism -Reports of alcoholism until a year ago. -Occasional alcohol user now. Counseled to quit. Continue thiamine and folic acid  Hyponatremia/hyperkalemia -Suspect hypervolemic hyponatremia.  -On admission, sodium was 127, serum osmolality 272, urine osmolality also low at 223.   -Check BMP in a week.  Started on Lasix.  Low albumin Protein calorie malnutrition -Albumin level low <1.5. Her baseline albumin level already seems low between 2-2.5. Likely related to liver disease and poor intake. -Nutrition consulted.  History of gastric bypass surgery 2005 -Probably followed by nutritional deficiencies. -Levels checked. Vitamin D level is low at 15 however ferritin, vitamin B12 and folate level normal to high.  -Start on vitamin D supplementation -Macrocytosis could be due to alcohol   Chronic pancreatitis -Due to alcohol. Treated with intermittent nausea, vomiting. Uses pancreatic enzymes at home. Continue the same.  Recent COVID-19 infection -Did not need hospitalization at that time. She took outpatient monoclonal antibody infusion. -The only residual symptom seems to be burning sensation in the mouth with any food.   Discharge Diagnoses:  Active Problems:   Electrolyte abnormality    Discharge Instructions  Discharge Instructions    Diet - low sodium heart healthy   Complete by: As directed    Discharge instructions   Complete by: As directed    1)Please follow up  with your PCP in a week.Do a BMP test during  the follow up. 2)Take prescribed medications as instructed. 3)Please stop alcohol consumption. 4)Follow up with gastroenterology as an outpatient for the management of liver cirrhosis.   Increase activity slowly   Complete by: As directed      Allergies as of 06/11/2019      Reactions   Latex Rash   "hands turn beat red and start to itch"   Atorvastatin Other (See Comments)   Swollen joints   Dilaudid [hydromorphone Hcl] Itching, Other (See Comments)   Itching- no rash- likely side effect but not true allergy.  Tolerates oxycodone.    Lexapro [escitalopram Oxalate] Other (See Comments)   Psychological changes   Lisinopril Other (See Comments)   Hives, presumed allergy   Mirtazapine Swelling   swelling   Nsaids    Gastritis 2013   Tylenol [acetaminophen] Other (See Comments)   Hx pancreatitis      Medication List    STOP taking these medications   metoprolol tartrate 50 MG tablet Commonly known as: LOPRESSOR   omeprazole 40 MG capsule Commonly known as: PRILOSEC     TAKE these medications   albuterol 108 (90 Base) MCG/ACT inhaler Commonly known as: VENTOLIN HFA Inhale 2 puffs into the lungs every 4 (four) hours as needed for wheezing or shortness of breath.   apixaban 5 MG Tabs tablet Commonly known as: ELIQUIS Take 2 tablets (10 mg total) by mouth 2 (two) times daily for 6 days.   apixaban 5 MG Tabs tablet Commonly known as: ELIQUIS Take 1 tablet (5 mg total) by mouth 2 (two) times daily. Start taking on: June 17, 2019   B-D INSULIN SYRINGE 1CC/25GX1" 25G X 1" 1 ML Misc Generic drug: Insulin Syringe-Needle U-100 Inject 1 Syringe into the muscle every 14 (fourteen) days. For B12 injection   cyanocobalamin 1000 MCG/ML injection Commonly known as: (VITAMIN B-12) INJECT INTRAMUSCULARLY EVERY 14 DAYS What changed: See the new instructions.   folic acid 1 MG tablet Commonly known as: FOLVITE Take 1 tablet (1 mg  total) by mouth daily.   furosemide 40 MG tablet Commonly known as: Lasix Take 1 tablet (40 mg total) by mouth daily.   gabapentin 300 MG capsule Commonly known as: NEURONTIN TAKE TWO CAPSULES BY MOUTH TWICE A DAY FOR HEADACHE What changed:   how much to take  how to take this  when to take this  additional instructions   lidocaine 2 % solution Commonly known as: XYLOCAINE Use 5 ml up to 4 times a day as needed.   lipase/protease/amylase 16967 UNITS Cpep capsule Commonly known as: CREON Take 1 capsule (36,000 Units total) by mouth 3 (three) times daily before meals. What changed:   when to take this  reasons to take this   ondansetron 4 MG disintegrating tablet Commonly known as: Zofran ODT Take 1 tablet (4 mg total) by mouth every 8 (eight) hours as needed for nausea or vomiting.   pantoprazole 40 MG tablet Commonly known as: PROTONIX Take 1 tablet (40 mg total) by mouth daily. What changed: when to take this   promethazine 12.5 MG tablet Commonly known as: PHENERGAN TAKE ONE TABLET BY MOUTH EVERY 6 HOURS AS NEEDED FOR NAUSEA AND VOMITING What changed: See the new instructions.   thiamine 100 MG tablet Take 1 tablet (100 mg total) by mouth daily.   traMADol 50 MG tablet Commonly known as: ULTRAM TAKE ONE TABLET BY MOUTH EVERY 8 HOUR AS NEEDED FOR PAIN What changed: See the new instructions.  venlafaxine 75 MG tablet Commonly known as: EFFEXOR TAKE ONE TABLET BY MOUTH TWICE A DAY   Vitamin D (Ergocalciferol) 1.25 MG (50000 UNIT) Caps capsule Commonly known as: DRISDOL Take 1 capsule (50,000 Units total) by mouth every 7 (seven) days.      Follow-up Information    Joaquim Namuncan, Graham S, MD. Schedule an appointment as soon as possible for a visit in 1 week(s).   Specialty: Family Medicine Contact information: [address] [place] [address] [phone]          Allergies  Allergen Reactions  . Latex Rash    "hands turn beat red  and start to itch"  . Atorvastatin Other (See Comments)    Swollen joints  . Dilaudid [Hydromorphone Hcl] Itching and Other (See Comments)    Itching- no rash- likely side effect but not true allergy.  Tolerates oxycodone.   [name]. Lexapro [Escitalopram Oxalate] Other (See Comments)    Psychological changes  . Lisinopril Other (See Comments)    Hives, presumed allergy  . Mirtazapine Swelling    swelling  . Nsaids     Gastritis 2013  . Tylenol [Acetaminophen] Other (See Comments)    Hx pancreatitis    Consultations: None  Procedures/Studies: DG Chest 2 View  Result Date: 05/12/2019 CLINICAL DATA:  Bilateral lower extremity edema EXAM: CHEST - 2 VIEW COMPARISON:  09/14/2017 FINDINGS: Frontal and lateral views of the chest demonstrate a stable cardiac silhouette. Postsurgical changes at the gastroesophageal junction again noted. Minimal left basilar scarring. No airspace disease, effusion, or pneumothorax. No acute bony abnormality. IMPRESSION: 1. Stable exam, no acute process. Electronically Signed   By: Sharlet SalinaMichael  Brown M.D.   On: 05/12/2019 10:43   DG Ankle Complete Right  Result Date: 05/19/2019 CLINICAL DATA:  Recent fall with bruising and swelling, initial encounter EXAM: RIGHT ANKLE - COMPLETE 3+ VIEW COMPARISON:  None. FINDINGS: Generalized soft tissue swelling is noted. No acute fracture or dislocation is noted. Calcaneal spurring is seen. IMPRESSION: Soft tissue swelling without acute bony abnormality Electronically Signed   By: Alcide CleverMark  Lukens M.D.   On: 05/19/2019 08:21   DG Chest Port 1 View  Result Date: 06/08/2019 CLINICAL DATA:  Shortness of breath. Leg edema. COVID in January. EXAM: PORTABLE CHEST 1 VIEW COMPARISON:  Radiograph 05/12/2019 FINDINGS: Lower lung volumes from prior exam. Stable streaky left basilar scarring. Unchanged heart size and mediastinal contours. No pulmonary edema. No large pleural effusion. No focal airspace disease. No pneumothorax. Surgical clips at the  gastroesophageal junction. No acute osseous abnormalities are seen. IMPRESSION: Lower lung volumes from radiographs last month. Stable streaky left basilar scarring. No new or acute findings. Electronically Signed   By: Narda RutherfordMelanie  Sanford M.D.   On: 06/08/2019 16:08   DG Foot Complete Right  Result Date: 05/19/2019 CLINICAL DATA:  Recent fall with right foot pain, initial encounter EXAM: RIGHT FOOT COMPLETE - 3+ VIEW COMPARISON:  02/11/2016 FINDINGS: Postsurgical changes are noted in the distal aspect of the fifth metatarsal. No acute fracture is noted. Calcaneal spurring and tarsal degenerative changes are seen. A small well corticated bony density is noted adjacent to the tarsal navicular bone medially. This may represent a small accessory ossicle or prior trauma with nonunion. This is stable from the prior exam. IMPRESSION: Chronic changes without acute abnormality. Electronically Signed   By: Alcide CleverMark  Lukens M.D.   On: 05/19/2019 08:21   VAS US LOWER EXTREMITY VENOUS (DVT)  Result Date: 06/09/2019  Lower Venous DVTStudy Indications: Edema.  Limitations: Body habitus and poor ultrasound/tissue interface. Comparison Study: No prior study Performing Technologist: Maudry Mayhew MHA, RDMS, RVT, RDCS  Examination Guidelines: A complete evaluation includes B-mode imaging, spectral Doppler, color Doppler, and power Doppler as needed of all accessible portions of each vessel. Bilateral testing is considered an integral part of a complete examination. Limited examinations for reoccurring indications may be performed as noted. The reflux portion of the exam is performed with the patient in reverse Trendelenburg.  +---------+---------------+---------+-----------+----------+--------------+ RIGHT    CompressibilityPhasicitySpontaneityPropertiesThrombus Aging +---------+---------------+---------+-----------+----------+--------------+ CFV      Full           Yes      Yes                                  +---------+---------------+---------+-----------+----------+--------------+ SFJ      Full                                                        +---------+---------------+---------+-----------+----------+--------------+ FV Prox  Full                                                        +---------+---------------+---------+-----------+----------+--------------+ FV Mid   Full                                                        +---------+---------------+---------+-----------+----------+--------------+ FV DistalFull                                                        +---------+---------------+---------+-----------+----------+--------------+ PFV      Full                                                        +---------+---------------+---------+-----------+----------+--------------+ POP      None                    No                   Acute          +---------+---------------+---------+-----------+----------+--------------+   Right Technical Findings: Not visualized segments include right peroneal veins. Limited visualization of posterior tibial veins.  +---------+---------------+---------+-----------+-----------------+------------+ LEFT     CompressibilityPhasicitySpontaneityProperties       Thrombus  Aging        +---------+---------------+---------+-----------+-----------------+------------+ CFV      None           Yes      Yes        partially        Acute                                                    re-cannalized                 +---------+---------------+---------+-----------+-----------------+------------+ SFJ      Full                                                             +---------+---------------+---------+-----------+-----------------+------------+ FV Prox  None                                                              +---------+---------------+---------+-----------+-----------------+------------+ FV Mid   Full                                                             +---------+---------------+---------+-----------+-----------------+------------+ FV DistalFull                                                             +---------+---------------+---------+-----------+-----------------+------------+ POP      Full           Yes      Yes                                      +---------+---------------+---------+-----------+-----------------+------------+ PTV      Full                                                             +---------+---------------+---------+-----------+-----------------+------------+   Left Technical Findings: Not visualized segments include profunda femoral vein. Limited evaluation of posterior tibial and peroneal veins.   Summary: RIGHT: - Findings consistent with acute deep vein thrombosis involving the right popliteal vein. - No cystic structure found in the popliteal fossa.  LEFT: - Findings consistent with acute deep vein thrombosis involving the left common femoral vein, and left femoral vein. - No cystic structure found in the popliteal fossa.  *See table(s) above for measurements and observations. Electronically signed by Sherald Hess MD  on 06/09/2019 at 4:43:18 PM.    Final    US ABDOMEN LIMITED RUQ  Result Date: 06/08/2019 CLINICAL DATA:  59 year old female with right upper quadrant abdominal pain. EXAM: ULTRASOUND ABDOMEN LIMITED RIGHT UPPER QUADRANT COMPARISON:  Right upper quadrant ultrasound dated 05/14/2017 and CT abdomen pelvis dated 03/06/2019. FINDINGS: Gallbladder: Cholecystectomy. Common bile duct: Diameter: 3 mm Liver: There is diffuse increased liver echogenicity consistent with fatty infiltration as seen on the prior CT. There is mild irregularity of the liver contour suggestive of early cirrhosis. Portal vein is patent on color Doppler imaging with  normal direction of blood flow towards the liver. Other: Small ascites. IMPRESSION: 1. Fatty liver with findings suggestive of early cirrhosis. Further evaluation with elastography on a nonemergent/outpatient basis recommended. 2. Patent main portal vein with hepatopetal flow. 3. Small ascites. Electronically Signed   By: Elgie Collard M.D.   On: 06/08/2019 16:50       Subjective: Patient seen and examined at the bedside this morning.  Hemodynamically stable for discharge.  Discharge Exam: Vitals:   06/11/19 0540 06/11/19 0626  BP: (!) 82/55 95/68  Pulse: 66 63  Resp: 16   Temp: 98.1 F (36.7 C)   SpO2: 96%    Vitals:   06/10/19 1347 06/10/19 2039 06/11/19 0540 06/11/19 0626  BP: 108/73 96/64 (!) 82/55 95/68  Pulse: 66 74 66 63  Resp: 15 14 16    Temp: 98.7 F (37.1 C) 98.3 F (36.8 C) 98.1 F (36.7 C)   TempSrc: Oral  Oral   SpO2: 96% 96% 96%   Weight:      Height:        General: Pt is alert, awake, not in acute distress Cardiovascular: RRR, S1/S2 +, no rubs, no gallops Respiratory: CTA bilaterally, no wheezing, no rhonchi Abdominal: Soft, NT, ND, bowel sounds + Extremities:  Edema of bilateral lower extremities, no cyanosis    The results of significant diagnostics from this hospitalization (including imaging, microbiology, ancillary and laboratory) are listed below for reference.     Microbiology: No results found for this or any previous visit (from the past 240 hour(s)).   Labs: BNP (last 3 results) No results for input(s): BNP in the last 8760 hours. Basic Metabolic Panel: Recent Labs  Lab 06/08/19 1127 06/08/19 1900 06/09/19 0650 06/10/19 0243 06/11/19 0325  NA 127* 127* 129* 127* 126*  K 5.2* 4.8 5.0 4.9 4.3  CL 96* 95* 96* 95* 96*  CO2 19* 23 24 25 24   GLUCOSE 91 90 75 102* 88  BUN 8 8 9 10 13   CREATININE 1.07* 0.99 0.90 1.14* 1.02*  CALCIUM 7.7* 7.8* 7.6* 7.6* 7.2*  MG  --  1.8  --   --   --   PHOS  --  3.2  --   --   --    Liver  Function Tests: Recent Labs  Lab 06/08/19 1127 06/08/19 1900 06/09/19 0650 06/10/19 0243 06/11/19 0325  AST 65* 47* 45* 44* 37  ALT 29 25 21 23 21   ALKPHOS 131* 124 120 122 117  BILITOT 1.9* 2.2* 2.3* 1.7* 1.5*  PROT 6.1* 6.0* 5.6* 5.6* 5.2*  ALBUMIN 1.5* 1.5* 1.3* 1.4* 1.2*   Recent Labs  Lab 06/08/19 1127  LIPASE 14   Recent Labs  Lab 06/08/19 1623  AMMONIA 35   CBC: Recent Labs  Lab 06/08/19 1127 06/08/19 1842 06/09/19 0645 06/10/19 0243 06/11/19 0325  WBC 8.5 9.5 9.1 10.4 9.9  HGB 12.2 11.7* 10.9* 10.6* 10.0*  HCT 37.7 35.2* 32.2* 30.8* 29.7*  MCV 102.4* 98.6 97.9 97.8 99.3  PLT 113* 184 184 234 221   Cardiac Enzymes: No results for input(s): CKTOTAL, CKMB, CKMBINDEX, TROPONINI in the last 168 hours. BNP: Invalid input(s): POCBNP CBG: No results for input(s): GLUCAP in the last 168 hours. D-Dimer No results for input(s): DDIMER in the last 72 hours. Hgb A1c No results for input(s): HGBA1C in the last 72 hours. Lipid Profile No results for input(s): CHOL, HDL, LDLCALC, TRIG, CHOLHDL, LDLDIRECT in the last 72 hours. Thyroid function studies No results for input(s): TSH, T4TOTAL, T3FREE, THYROIDAB in the last 72 hours.  Invalid input(s): FREET3 Anemia work up Recent Labs    06/10/19 0243  VITAMINB12 3,273*  FOLATE 5.5*  FERRITIN 300   Urinalysis    Component Value Date/Time   COLORURINE YELLOW (A) 03/06/2019 1619   APPEARANCEUR CLEAR (A) 03/06/2019 1619   APPEARANCEUR Clear 06/16/2013 1857   LABSPEC 1.039 (H) 03/06/2019 1619   LABSPEC 1.024 06/16/2013 1857   PHURINE 7.0 03/06/2019 1619   GLUCOSEU NEGATIVE 03/06/2019 1619   GLUCOSEU Negative 06/16/2013 1857   HGBUR MODERATE (A) 03/06/2019 1619   BILIRUBINUR Neg 05/12/2019 0939   BILIRUBINUR Negative 06/16/2013 1857   KETONESUR NEGATIVE 03/06/2019 1619   PROTEINUR Negative 05/12/2019 0939   PROTEINUR NEGATIVE 03/06/2019 1619   UROBILINOGEN 0.2 05/12/2019 0939   UROBILINOGEN 2.0 (H)  04/19/2012 1205   NITRITE Positive 05/12/2019 0939   NITRITE POSITIVE (A) 03/06/2019 1619   LEUKOCYTESUR Large (3+) (A) 05/12/2019 0939   LEUKOCYTESUR TRACE (A) 03/06/2019 1619   LEUKOCYTESUR Negative 06/16/2013 1857   Sepsis Labs Invalid input(s): PROCALCITONIN,  WBC,  LACTICIDVEN Microbiology No results found for this or any previous visit (from the past 240 hour(s)).  Please note: You were cared for by a hospitalist during your hospital stay. Once you are discharged, your primary care physician will handle any further medical issues. Please note that NO REFILLS for any discharge medications will be authorized once you are discharged, as it is imperative that you return to your primary care physician (or establish a relationship with a primary care physician if you do not have one) for your post hospital discharge needs so that they can reassess your need for medications and monitor your lab values.    Time coordinating discharge: 40 minutes  SIGNED:   Burnadette Pop, MD  [facility] 06/11/2019, 8:59 AM Pager [phone]  If 7PM-7AM, please contact night-coverage [email or web address] Password [email or web address]

## 2019-06-11 NOTE — Progress Notes (Signed)
Called and left a message for [name], TOC, for follow up on setting up [facility] PT and any coupons for Eliquis.  Called Pharmacy to see if they are aware of any coupons available and they are not.  In the past, pt has received some coupons from one of her doctors and she stated she could check with him tomorrow.

## 2019-06-12 ENCOUNTER — Telehealth: Payer: Self-pay

## 2019-06-12 NOTE — Telephone Encounter (Signed)
Noted. Thanks.

## 2019-06-12 NOTE — Telephone Encounter (Signed)
Transition Care Management Follow-up Telephone Call  Date of discharge and from where: 06/11/2019, [facility]  How have you been since you were released from the hospital? Patient states that she is still having some dizziness and confusion but overall is feeling much better.   Any questions or concerns? No   Items Reviewed:  Did the pt receive and understand the discharge instructions provided? Yes   Medications obtained and verified? Yes   Any new allergies since your discharge? No   Dietary orders reviewed? Yes  Do you have support at home? Yes   Functional Questionnaire: (I = Independent and D = Dependent) ADLs: I  Bathing/Dressing- I  Meal Prep- I  Eating- I  Maintaining continence- I  Transferring/Ambulation- I  Managing Meds- I  Follow up appointments reviewed:   PCP Hospital f/u appt confirmed? No  Patient wanted to wait until after home health comes in and assesses her this week before scheduling a follow up visit. Patient stated she will call back after they come to schedule an appointment.   Specialist Hospital f/u appt confirmed? N/A  Are transportation arrangements needed? No   If their condition worsens, is the pt aware to call PCP or go to the Emergency Dept.? Yes  Was the patient provided with contact information for the PCP's office or ED? Yes  Was to pt encouraged to call back with questions or concerns? Yes

## 2019-06-13 ENCOUNTER — Telehealth: Payer: Self-pay | Admitting: *Deleted

## 2019-06-13 NOTE — Telephone Encounter (Signed)
Abigail [name] with [facility] left a message at 4:50 pm requesting verbal orders to go out tomorrow 06/14/19 for start of care. Abigail [name] requested a call back ASAP.

## 2019-06-13 NOTE — Telephone Encounter (Signed)
Please give the order.  Thanks.   

## 2019-06-13 NOTE — Telephone Encounter (Signed)
Lynn advised.

## 2019-06-14 ENCOUNTER — Telehealth: Payer: Self-pay | Admitting: Family Medicine

## 2019-06-14 NOTE — Telephone Encounter (Signed)
Thayer Ohm, Physical Therapist, with [facility] called to get verbal orders for physical therapy  2 x a week for 4 weeks.  He has a medication interaction-Tramadol 50 mg.  Patient has an allergy to hydromorphone.

## 2019-06-14 NOTE — Telephone Encounter (Signed)
Thayer Ohm notified as instructed by telephone and verbalized understanding.

## 2019-06-14 NOTE — Telephone Encounter (Signed)
Please give the order.  Thanks.  She has a history of itching with Dilaudid but not allergy symptoms otherwise.  That may be a side effect but not a true allergy.  She has tolerated oxycodone previously.  She should be able to tolerate tramadol.  Thanks.

## 2019-06-16 ENCOUNTER — Ambulatory Visit: Payer: 59 | Admitting: Family Medicine

## 2019-06-19 ENCOUNTER — Ambulatory Visit: Payer: 59 | Admitting: Family Medicine

## 2019-06-20 ENCOUNTER — Ambulatory Visit: Payer: 59 | Admitting: Family Medicine

## 2019-06-25 ENCOUNTER — Other Ambulatory Visit: Payer: Self-pay | Admitting: Family Medicine

## 2019-06-26 ENCOUNTER — Telehealth: Payer: Self-pay

## 2019-06-26 ENCOUNTER — Inpatient Hospital Stay
Admission: EM | Admit: 2019-06-26 | Discharge: 2019-06-29 | DRG: 432 | Disposition: A | Discharge status: Home Health | Payer: 59 | Attending: Internal Medicine | Admitting: Internal Medicine

## 2019-06-26 ENCOUNTER — Encounter: Payer: Self-pay | Admitting: Emergency Medicine

## 2019-06-26 ENCOUNTER — Emergency Department: Payer: 59

## 2019-06-26 ENCOUNTER — Other Ambulatory Visit: Payer: Self-pay

## 2019-06-26 DIAGNOSIS — K7682 Hepatic encephalopathy: Secondary | ICD-10-CM

## 2019-06-26 DIAGNOSIS — K7030 Alcoholic cirrhosis of liver without ascites: Secondary | ICD-10-CM | POA: Diagnosis not present

## 2019-06-26 DIAGNOSIS — Z8249 Family history of ischemic heart disease and other diseases of the circulatory system: Secondary | ICD-10-CM | POA: Diagnosis not present

## 2019-06-26 DIAGNOSIS — Z833 Family history of diabetes mellitus: Secondary | ICD-10-CM

## 2019-06-26 DIAGNOSIS — D684 Acquired coagulation factor deficiency: Secondary | ICD-10-CM | POA: Diagnosis present

## 2019-06-26 DIAGNOSIS — K769 Liver disease, unspecified: Secondary | ICD-10-CM | POA: Diagnosis not present

## 2019-06-26 DIAGNOSIS — Z832 Family history of diseases of the blood and blood-forming organs and certain disorders involving the immune mechanism: Secondary | ICD-10-CM

## 2019-06-26 DIAGNOSIS — K7290 Hepatic failure, unspecified without coma: Secondary | ICD-10-CM | POA: Diagnosis present

## 2019-06-26 DIAGNOSIS — Z8616 Personal history of COVID-19: Secondary | ICD-10-CM | POA: Diagnosis not present

## 2019-06-26 DIAGNOSIS — K529 Noninfective gastroenteritis and colitis, unspecified: Secondary | ICD-10-CM | POA: Diagnosis present

## 2019-06-26 DIAGNOSIS — Z79899 Other long term (current) drug therapy: Secondary | ICD-10-CM

## 2019-06-26 DIAGNOSIS — K861 Other chronic pancreatitis: Secondary | ICD-10-CM | POA: Diagnosis present

## 2019-06-26 DIAGNOSIS — D7589 Other specified diseases of blood and blood-forming organs: Secondary | ICD-10-CM | POA: Diagnosis present

## 2019-06-26 DIAGNOSIS — E1142 Type 2 diabetes mellitus with diabetic polyneuropathy: Secondary | ICD-10-CM | POA: Diagnosis not present

## 2019-06-26 DIAGNOSIS — Z86718 Personal history of other venous thrombosis and embolism: Secondary | ICD-10-CM

## 2019-06-26 DIAGNOSIS — K219 Gastro-esophageal reflux disease without esophagitis: Secondary | ICD-10-CM | POA: Diagnosis present

## 2019-06-26 DIAGNOSIS — Z9884 Bariatric surgery status: Secondary | ICD-10-CM

## 2019-06-26 DIAGNOSIS — I82431 Acute embolism and thrombosis of right popliteal vein: Secondary | ICD-10-CM | POA: Diagnosis present

## 2019-06-26 DIAGNOSIS — E871 Hypo-osmolality and hyponatremia: Secondary | ICD-10-CM | POA: Diagnosis present

## 2019-06-26 DIAGNOSIS — G9341 Metabolic encephalopathy: Secondary | ICD-10-CM | POA: Diagnosis present

## 2019-06-26 DIAGNOSIS — N179 Acute kidney failure, unspecified: Secondary | ICD-10-CM | POA: Diagnosis not present

## 2019-06-26 DIAGNOSIS — D6851 Activated protein C resistance: Secondary | ICD-10-CM | POA: Diagnosis present

## 2019-06-26 DIAGNOSIS — I81 Portal vein thrombosis: Secondary | ICD-10-CM | POA: Diagnosis present

## 2019-06-26 DIAGNOSIS — E538 Deficiency of other specified B group vitamins: Secondary | ICD-10-CM | POA: Diagnosis present

## 2019-06-26 DIAGNOSIS — Z20822 Contact with and (suspected) exposure to covid-19: Secondary | ICD-10-CM | POA: Diagnosis present

## 2019-06-26 DIAGNOSIS — D649 Anemia, unspecified: Secondary | ICD-10-CM | POA: Diagnosis present

## 2019-06-26 DIAGNOSIS — K709 Alcoholic liver disease, unspecified: Secondary | ICD-10-CM | POA: Diagnosis not present

## 2019-06-26 DIAGNOSIS — Z7901 Long term (current) use of anticoagulants: Secondary | ICD-10-CM

## 2019-06-26 DIAGNOSIS — E875 Hyperkalemia: Secondary | ICD-10-CM | POA: Diagnosis present

## 2019-06-26 DIAGNOSIS — Y900 Blood alcohol level of less than 20 mg/100 ml: Secondary | ICD-10-CM | POA: Diagnosis present

## 2019-06-26 DIAGNOSIS — K760 Fatty (change of) liver, not elsewhere classified: Secondary | ICD-10-CM | POA: Diagnosis present

## 2019-06-26 DIAGNOSIS — E8770 Fluid overload, unspecified: Secondary | ICD-10-CM | POA: Diagnosis not present

## 2019-06-26 DIAGNOSIS — Z886 Allergy status to analgesic agent status: Secondary | ICD-10-CM

## 2019-06-26 DIAGNOSIS — R188 Other ascites: Secondary | ICD-10-CM | POA: Diagnosis not present

## 2019-06-26 DIAGNOSIS — E43 Unspecified severe protein-calorie malnutrition: Secondary | ICD-10-CM | POA: Diagnosis present

## 2019-06-26 DIAGNOSIS — F1020 Alcohol dependence, uncomplicated: Secondary | ICD-10-CM | POA: Diagnosis present

## 2019-06-26 DIAGNOSIS — E440 Moderate protein-calorie malnutrition: Secondary | ICD-10-CM | POA: Diagnosis not present

## 2019-06-26 DIAGNOSIS — I10 Essential (primary) hypertension: Secondary | ICD-10-CM | POA: Diagnosis present

## 2019-06-26 DIAGNOSIS — E8809 Other disorders of plasma-protein metabolism, not elsewhere classified: Secondary | ICD-10-CM | POA: Diagnosis not present

## 2019-06-26 DIAGNOSIS — K7460 Unspecified cirrhosis of liver: Secondary | ICD-10-CM | POA: Diagnosis present

## 2019-06-26 DIAGNOSIS — K7040 Alcoholic hepatic failure without coma: Principal | ICD-10-CM | POA: Diagnosis present

## 2019-06-26 DIAGNOSIS — R319 Hematuria, unspecified: Secondary | ICD-10-CM | POA: Diagnosis not present

## 2019-06-26 DIAGNOSIS — E878 Other disorders of electrolyte and fluid balance, not elsewhere classified: Secondary | ICD-10-CM | POA: Diagnosis present

## 2019-06-26 DIAGNOSIS — Z888 Allergy status to other drugs, medicaments and biological substances status: Secondary | ICD-10-CM

## 2019-06-26 DIAGNOSIS — R410 Disorientation, unspecified: Secondary | ICD-10-CM

## 2019-06-26 DIAGNOSIS — I82412 Acute embolism and thrombosis of left femoral vein: Secondary | ICD-10-CM | POA: Diagnosis present

## 2019-06-26 DIAGNOSIS — K7200 Acute and subacute hepatic failure without coma: Secondary | ICD-10-CM | POA: Diagnosis present

## 2019-06-26 DIAGNOSIS — F419 Anxiety disorder, unspecified: Secondary | ICD-10-CM | POA: Diagnosis present

## 2019-06-26 DIAGNOSIS — Z91040 Latex allergy status: Secondary | ICD-10-CM

## 2019-06-26 LAB — HEPATIC FUNCTION PANEL
ALT: 34 U/L (ref 0–44)
AST: 58 U/L — ABNORMAL HIGH (ref 15–41)
Albumin: 1.6 g/dL — ABNORMAL LOW (ref 3.5–5.0)
Alkaline Phosphatase: 123 U/L (ref 38–126)
Bilirubin, Direct: 1.4 mg/dL — ABNORMAL HIGH (ref 0.0–0.2)
Indirect Bilirubin: 3.4 mg/dL — ABNORMAL HIGH (ref 0.3–0.9)
Total Bilirubin: 4.8 mg/dL — ABNORMAL HIGH (ref 0.3–1.2)
Total Protein: 6.5 g/dL (ref 6.5–8.1)

## 2019-06-26 LAB — BASIC METABOLIC PANEL
Anion gap: 6 (ref 5–15)
BUN: 22 mg/dL — ABNORMAL HIGH (ref 6–20)
CO2: 23 mmol/L (ref 22–32)
Calcium: 7.8 mg/dL — ABNORMAL LOW (ref 8.9–10.3)
Chloride: 93 mmol/L — ABNORMAL LOW (ref 98–111)
Creatinine, Ser: 0.91 mg/dL (ref 0.44–1.00)
GFR calc Af Amer: >60 mL/min (ref >60)
GFR calc non Af Amer: >60 mL/min (ref >60)
Glucose, Bld: 96 mg/dL (ref 70–99)
Potassium: 5.5 mmol/L — ABNORMAL HIGH (ref 3.5–5.1)
Sodium: 122 mmol/L — ABNORMAL LOW (ref 135–145)

## 2019-06-26 LAB — OSMOLALITY: Osmolality: 269 mOsm/kg — ABNORMAL LOW (ref 275–295)

## 2019-06-26 LAB — PROTIME-INR
INR: 1.6 — ABNORMAL HIGH (ref 0.8–1.2)
Prothrombin Time: 19.2 seconds — ABNORMAL HIGH (ref 11.4–15.2)

## 2019-06-26 LAB — CBC
HCT: 30.7 % — ABNORMAL LOW (ref 36.0–46.0)
Hemoglobin: 10.6 g/dL — ABNORMAL LOW (ref 12.0–15.0)
MCH: 33.9 pg (ref 26.0–34.0)
MCHC: 34.5 g/dL (ref 30.0–36.0)
MCV: 98.1 fL (ref 80.0–100.0)
Platelets: 257 10*3/uL (ref 150–400)
RBC: 3.13 MIL/uL — ABNORMAL LOW (ref 3.87–5.11)
RDW: 15.4 % (ref 11.5–15.5)
WBC: 9.6 10*3/uL (ref 4.0–10.5)
nRBC: 0 % (ref 0.0–0.2)

## 2019-06-26 LAB — GLUCOSE, CAPILLARY: Glucose-Capillary: 81 mg/dL (ref 70–99)

## 2019-06-26 LAB — ETHANOL: Alcohol, Ethyl (B): <10 mg/dL (ref <10)

## 2019-06-26 LAB — AMMONIA: Ammonia: 94 umol/L — ABNORMAL HIGH (ref 9–35)

## 2019-06-26 LAB — TSH: TSH: 1.959 u[IU]/mL (ref 0.350–4.500)

## 2019-06-26 MED ORDER — TRAMADOL HCL 50 MG PO TABS
50.0000 mg | ORAL_TABLET | Freq: Three times a day (TID) | ORAL | Status: DC | PRN
  Administered 2019-06-28: 50 mg via ORAL
  Filled 2019-06-26 (×2): qty 1

## 2019-06-26 MED ORDER — SODIUM CHLORIDE 0.9 % IV SOLN: Freq: Once | INTRAVENOUS | Status: AC

## 2019-06-26 MED ORDER — PANCRELIPASE (LIP-PROT-AMYL) 12000-38000 UNITS PO CPEP
36000.0000 [IU] | ORAL_CAPSULE | Freq: Three times a day (TID) | ORAL | Status: DC
  Administered 2019-06-27 – 2019-06-29 (×8): 36000 [IU] via ORAL
  Filled 2019-06-26 (×12): qty 3

## 2019-06-26 MED ORDER — PANTOPRAZOLE SODIUM 40 MG PO TBEC
40.0000 mg | DELAYED_RELEASE_TABLET | Freq: Every day | ORAL | Status: DC
  Administered 2019-06-27 – 2019-06-29 (×3): 40 mg via ORAL
  Filled 2019-06-26 (×4): qty 1

## 2019-06-26 MED ORDER — SODIUM CHLORIDE 0.9 % IV BOLUS
500.0000 mL | Freq: Once | INTRAVENOUS | Status: AC
  Administered 2019-06-26: 500 mL via INTRAVENOUS

## 2019-06-26 MED ORDER — APIXABAN 5 MG PO TABS
5.0000 mg | ORAL_TABLET | Freq: Two times a day (BID) | ORAL | Status: DC
  Administered 2019-06-27 – 2019-06-29 (×6): 5 mg via ORAL
  Filled 2019-06-26 (×6): qty 1

## 2019-06-26 MED ORDER — ONDANSETRON 4 MG PO TBDP: 4.0000 mg | ORAL_TABLET | Freq: Three times a day (TID) | ORAL | Status: DC | PRN

## 2019-06-26 MED ORDER — ALBUTEROL SULFATE (2.5 MG/3ML) 0.083% IN NEBU: 2.5000 mg | INHALATION_SOLUTION | RESPIRATORY_TRACT | Status: DC | PRN

## 2019-06-26 MED ORDER — FOLIC ACID 1 MG PO TABS: 1.0000 mg | ORAL_TABLET | Freq: Every day | ORAL | Status: DC

## 2019-06-26 MED ORDER — GABAPENTIN 300 MG PO CAPS
600.0000 mg | ORAL_CAPSULE | Freq: Two times a day (BID) | ORAL | Status: DC
  Administered 2019-06-27 (×2): 600 mg via ORAL
  Filled 2019-06-26 (×2): qty 2

## 2019-06-26 MED ORDER — ACETAMINOPHEN 325 MG PO TABS: 650.0000 mg | ORAL_TABLET | Freq: Four times a day (QID) | ORAL | Status: DC | PRN

## 2019-06-26 MED ORDER — LACTULOSE 10 GM/15ML PO SOLN
30.0000 g | Freq: Three times a day (TID) | ORAL | Status: DC
  Administered 2019-06-27 – 2019-06-29 (×8): 30 g via ORAL
  Filled 2019-06-26 (×8): qty 60

## 2019-06-26 MED ORDER — VENLAFAXINE HCL 37.5 MG PO TABS
75.0000 mg | ORAL_TABLET | Freq: Two times a day (BID) | ORAL | Status: DC
  Administered 2019-06-27 – 2019-06-29 (×5): 75 mg via ORAL
  Filled 2019-06-26 (×10): qty 2

## 2019-06-26 MED ORDER — CYANOCOBALAMIN 1000 MCG/ML IJ SOLN
1000.0000 ug | INTRAMUSCULAR | Status: DC
  Administered 2019-06-28: 1000 ug via INTRAMUSCULAR
  Filled 2019-06-26: qty 1

## 2019-06-26 MED ORDER — ONDANSETRON HCL 4 MG/2ML IJ SOLN
4.0000 mg | Freq: Four times a day (QID) | INTRAMUSCULAR | Status: DC | PRN
  Administered 2019-06-27: 4 mg via INTRAVENOUS
  Filled 2019-06-26: qty 2

## 2019-06-26 MED ORDER — THIAMINE HCL 100 MG PO TABS
100.0000 mg | ORAL_TABLET | Freq: Every day | ORAL | Status: DC
  Administered 2019-06-27 – 2019-06-29 (×3): 100 mg via ORAL
  Filled 2019-06-26 (×3): qty 1

## 2019-06-26 MED ORDER — PROMETHAZINE HCL 25 MG PO TABS
12.5000 mg | ORAL_TABLET | Freq: Four times a day (QID) | ORAL | Status: DC | PRN
  Administered 2019-06-27: 12.5 mg via ORAL
  Filled 2019-06-26 (×2): qty 1

## 2019-06-26 MED ORDER — SODIUM CHLORIDE 0.9 % IV SOLN: INTRAVENOUS | Status: DC

## 2019-06-26 MED ORDER — SODIUM CHLORIDE 0.9% FLUSH: 3.0000 mL | Freq: Once | INTRAVENOUS | Status: DC

## 2019-06-26 MED ORDER — VITAMIN D (ERGOCALCIFEROL) 1.25 MG (50000 UNIT) PO CAPS
50000.0000 [IU] | ORAL_CAPSULE | ORAL | Status: DC
  Administered 2019-06-28: 50000 [IU] via ORAL
  Filled 2019-06-26: qty 1

## 2019-06-26 MED ORDER — THIAMINE HCL 100 MG/ML IJ SOLN
100.0000 mg | Freq: Once | INTRAMUSCULAR | Status: AC
  Administered 2019-06-26: 100 mg via INTRAVENOUS
  Filled 2019-06-26: qty 2

## 2019-06-26 MED ORDER — ONDANSETRON HCL 4 MG PO TABS: 4.0000 mg | ORAL_TABLET | Freq: Four times a day (QID) | ORAL | Status: DC | PRN

## 2019-06-26 MED ORDER — THIAMINE HCL 100 MG PO TABS: 100.0000 mg | ORAL_TABLET | Freq: Every day | ORAL | Status: DC

## 2019-06-26 MED ORDER — TRAZODONE HCL 50 MG PO TABS: 25.0000 mg | ORAL_TABLET | Freq: Every evening | ORAL | Status: DC | PRN

## 2019-06-26 MED ORDER — FOLIC ACID 1 MG PO TABS
1.0000 mg | ORAL_TABLET | Freq: Every day | ORAL | Status: DC
  Administered 2019-06-27 – 2019-06-29 (×3): 1 mg via ORAL
  Filled 2019-06-26 (×3): qty 1

## 2019-06-26 MED ORDER — MAGNESIUM HYDROXIDE 400 MG/5ML PO SUSP: 30.0000 mL | Freq: Every day | ORAL | Status: DC | PRN

## 2019-06-26 MED ORDER — ADULT MULTIVITAMIN W/MINERALS CH
1.0000 | ORAL_TABLET | Freq: Every day | ORAL | Status: DC
  Administered 2019-06-27 – 2019-06-29 (×3): 1 via ORAL
  Filled 2019-06-26 (×4): qty 1

## 2019-06-26 MED ORDER — ACETAMINOPHEN 650 MG RE SUPP: 650.0000 mg | Freq: Four times a day (QID) | RECTAL | Status: DC | PRN

## 2019-06-26 NOTE — ED Notes (Signed)
[facility] sent to lab

## 2019-06-26 NOTE — ED Provider Notes (Signed)
[facility] Emergency Department Provider Note  ____________________________________________   First MD Initiated Contact with Patient 06/26/19 1744     (approximate)  I have reviewed the triage vital signs and the nursing notes.  History  Chief Complaint Altered Mental Status and Weakness    HPI Abigail [name] is a 59 y.o. female with past medical history of alcoholism, chronic pancreatitis, DVT on anticoagulation, who presents to the emergency department for altered mental status and generalized weakness.  Patient complains of generalized weakness all over, which has been going on for "quite some time", unable to provide any further details regarding the time course or characterize further. She denies any chest pain or abdominal pain. Reports "off and on" vomiting, also for "some time", and again cannot characterize this further.  Per triage note, her home care nurse visited her today, and noticed that she was overall weak and had not been out of bed all day, was more confused that normal, prompting evaluation.   She does admit to prior alcohol use. States she has not drank in several months.  Caveat: history limited due to patient's altered mental status, confusion.   Past Medical Hx Past Medical History:  Diagnosis Date  . Alcoholism ([place])   . Cholecystitis   . COVID-19 virus infection 03/2019  . GERD (gastroesophageal reflux disease)   . Headache    improved with gabapentin   . Hypertension   . Pancreatic insufficiency   . Pancreatitis 10/2015   2013 alcohol and/or gallstone  . Photosensitivity    (No formal dx of lupus) prev eval Dr. Jefm Bryant in [place]  . Portal vein thrombosis 2019    Problem List Patient Active Problem List   Diagnosis Date Noted  . SOB (shortness of breath) 06/11/2019  . Electrolyte abnormality 06/08/2019  . Pancreatic insufficiency 05/22/2019  . Acute foot pain, right 05/19/2019  . Edema 05/14/2019  . Headache     . Lower abdominal pain 08/11/2018  . Colon cancer screening 11/24/2017  . Right knee pain 06/30/2017  . Neck strain 06/17/2017  . Portal vein thrombosis 05/27/2017  . Sepsis ([place]) 05/13/2017  . History of acute pancreatitis 10/26/2015  . Other migraine without status migrainosus, not intractable 09/17/2015  . Sciatica 10/10/2014  . Routine general medical examination at a health care facility 06/05/2014  . Advance care planning 06/05/2014  . Pain in joint, shoulder region 07/19/2013  . History of alcohol abuse 09/13/2012  . Anxiety 04/10/2012  . B12 deficiency 01/31/2010  . Pain in joint 01/31/2010  . Essential hypertension 08/16/2006  . ASTHMA, EXTRINSIC NOS 08/16/2006  . ESOPHAGITIS 08/16/2006  . GERD 08/16/2006    Past Surgical Hx Past Surgical History:  Procedure Laterality Date  . APPENDECTOMY     As a child  . CAT scan  11/00   [facility]  . CHOLECYSTECTOMY  04/20/2012   Procedure: LAPAROSCOPIC CHOLECYSTECTOMY WITH INTRAOPERATIVE CHOLANGIOGRAM;  Surgeon: Merrie Roof, MD;  Location: [place];  Service: General;  Laterality: N/A;  . COLONOSCOPY  2005  . DOBUTAMINE STRESS ECHO  11/00   [facility]l  . DOPPLER ECHOCARDIOGRAPHY     Secondary to [place], [facility]  . ESOPHAGOGASTRODUODENOSCOPY  8/01   Esophagitis [facility])  . FOOT SURGERY  04/2015   Pin in small toe  . 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 NSVD     X 2  . SBFT  08/01   [facility]l  . TONSILLECTOMY     As a child  .  TUBAL LIGATION  Late 20's    Medications Prior to Admission medications   Medication Sig Start Date End Date Taking? Authorizing Provider  albuterol (PROVENTIL HFA;VENTOLIN HFA) 108 (90 Base) MCG/ACT inhaler Inhale 2 puffs into the lungs every 4 (four) hours as needed for wheezing or shortness of breath. 06/20/18   Doreene Nest, NP  apixaban (ELIQUIS) 5 MG TABS tablet Take 2 tablets (10 mg total) by mouth 2 (two) times daily for 6 days. 06/11/19 06/17/19  Burnadette Pop, MD  apixaban (ELIQUIS) 5 MG  TABS tablet Take 1 tablet (5 mg total) by mouth 2 (two) times daily. 06/17/19   Burnadette Pop, MD  cyanocobalamin (,VITAMIN B-12,) 1000 MCG/ML injection INJECT INTRAMUSCULARLY EVERY 14 DAYS Patient taking differently: Inject 1,000 mcg into the muscle every 14 (fourteen) days.  05/15/19   Joaquim Nam, MD  folic acid (FOLVITE) 1 MG tablet Take 1 tablet (1 mg total) by mouth daily. 06/11/19   Burnadette Pop, MD  furosemide (LASIX) 40 MG tablet Take 1 tablet (40 mg total) by mouth daily. 06/11/19 06/10/20  Burnadette Pop, MD  gabapentin (NEURONTIN) 300 MG capsule TAKE TWO CAPSULES BY MOUTH TWICE A DAY FOR HEADACHE Patient taking differently: Take 600 mg by mouth 2 (two) times daily.  04/05/19   Joaquim Nam, MD  Insulin Syringe-Needle U-100 (B-D INSULIN SYRINGE 1CC/25GX1") 25G X 1" 1 ML MISC Inject 1 Syringe into the muscle every 14 (fourteen) days. For B12 injection 11/16/18   Joaquim Nam, MD  lidocaine (XYLOCAINE) 2 % solution Use 5 ml up to 4 times a day as needed. 07/08/18   Joaquim Nam, MD  lipase/protease/amylase (CREON) 36000 UNITS CPEP capsule Take 1 capsule (36,000 Units total) by mouth 3 (three) times daily before meals. Patient taking differently: Take 36,000 Units by mouth 3 (three) times daily as needed.  05/22/19   Joaquim Nam, MD  ondansetron (ZOFRAN ODT) 4 MG disintegrating tablet Take 1 tablet (4 mg total) by mouth every 8 (eight) hours as needed for nausea or vomiting. 05/12/19   Joaquim Nam, MD  pantoprazole (PROTONIX) 40 MG tablet Take 1 tablet (40 mg total) by mouth daily. 06/11/19   Burnadette Pop, MD  promethazine (PHENERGAN) 12.5 MG tablet TAKE ONE TABLET BY MOUTH EVERY 6 HOURS AS NEEDED FOR NAUSEA AND VOMITING Patient taking differently: Take 12.5 mg by mouth every 6 (six) hours as needed for nausea or vomiting.  03/10/19   Joaquim Nam, MD  thiamine 100 MG tablet Take 1 tablet (100 mg total) by mouth daily. 06/11/19   Burnadette Pop, MD  traMADol (ULTRAM)  50 MG tablet TAKE ONE TABLET BY MOUTH EVERY 8 HOUR AS NEEDED FOR PAIN Patient taking differently: Take 50 mg by mouth every 8 (eight) hours as needed for moderate pain.  03/24/19   Joaquim Nam, MD  venlafaxine (EFFEXOR) 75 MG tablet TAKE ONE TABLET BY MOUTH TWICE A DAY Patient taking differently: Take 75 mg by mouth 2 (two) times daily.  03/27/19   Joaquim Nam, MD  Vitamin D, Ergocalciferol, (DRISDOL) 1.25 MG (50000 UNIT) CAPS capsule Take 1 capsule (50,000 Units total) by mouth every 7 (seven) days. 06/11/19   Burnadette Pop, MD    Allergies Latex, Atorvastatin, Dilaudid [hydromorphone hcl], Lexapro [escitalopram oxalate], Lisinopril, Mirtazapine, Nsaids, and Tylenol [acetaminophen]  Family Hx Family History  Problem Relation Age of Onset  . Heart disease Mother        CAD, angioplasty twice with stents  .  Cancer Mother 58       Breast  . Breast cancer Mother 65  . Hypertension Mother   . Peripheral Artery Disease Mother   . Hypertension Father   . Hypothyroidism Father   . Heart disease Father        MI at age 29  . Cancer Father 14       H/O renal CA  . Diabetes Sister   . Cancer Sister 65       Renal CA  . Factor V Leiden deficiency Brother   . Clotting disorder Brother   . Colon cancer Neg Hx     Social Hx Social History   Tobacco Use  . Smoking status: Never Smoker  . Smokeless tobacco: Never Used  Substance Use Topics  . Alcohol use: Yes    Alcohol/week: 3.0 standard drinks    Types: 2 Glasses of wine, 1 Cans of beer per week    Comment: cvouple glsses of wine over weekdend and beer monday   . Drug use: No     Review of Systems Unable to reliably obtain due to patient's confusion, altered mental status.  Physical Exam  Vital Signs: ED Triage Vitals  Enc Vitals Group     BP 06/26/19 1450 127/86     Pulse Rate 06/26/19 1450 (!) 106     Resp 06/26/19 1450 16     Temp 06/26/19 1450 98.1 F (36.7 C)     Temp Source 06/26/19 1450 Oral     SpO2  06/26/19 1450 96 %     Weight 06/26/19 1453 150 lb (68 kg)     Height 06/26/19 1453 5' (1.524 m)     Head Circumference --      Peak Flow --      Pain Score 06/26/19 1453 0     Pain Loc --      Pain Edu? --      Excl. in GC? --     Constitutional: Appears older than stated age. Appears fatigued. Oriented to self and place, NOT oriented to time.  Head: Normocephalic. Atraumatic. No facial droop. Eyes: Conjunctivae clear, sclera anicteric. Pupils equal and symmetric. Nose: No masses or lesions. No congestion or rhinorrhea. Mouth/Throat: Wearing mask.  Neck: No stridor. Trachea midline.  Cardiovascular: Normal rate, regular rhythm. Extremities well perfused. Respiratory: Normal respiratory effort.  Lungs CTAB. Gastrointestinal: Soft. Non-distended. Non-tender.  Genitourinary: Deferred. Musculoskeletal: Bilateral symmetric pitting edema to LE. Neurologic: Slow to answer questions, but no dysarthria or aphasia. Will often answer questions with previous answer. Oriented to self and place, NOT oriented to time.  Equal and symmetric strength in upper extremity, lower extremities.  Face symmetric.  No droop.  No gross focal or lateralizing neurologic deficits are appreciated.  Skin: Skin is warm, dry and intact. No rash noted. Psychiatric: Mood and affect are appropriate for situation.  EKG  Personally reviewed and interpreted by myself.   Rate: 98 Rhythm: sinus Axis: normal Intervals: short PR 104 ms, otherwise WNL Low voltage No STEMI    Radiology  CT head: IMPRESSION: No evidence of acute intracranial abnormality.     Procedures  Procedure(s) performed (including critical care):  .Critical Care Performed by: Miguel Aschoff., MD Authorized by: Miguel Aschoff., MD   Critical care provider statement:    Critical care time (minutes):  45   Critical care was necessary to treat or prevent imminent or life-threatening deterioration of the following conditions: AMS 2/2  hepatic encephalopathy, hyponatremia requiring  careful correction.   Critical care was time spent personally by me on the following activities:  Discussions with consultants, evaluation of patient's response to treatment, examination of patient, ordering and performing treatments and interventions, ordering and review of laboratory studies, ordering and review of radiographic studies, pulse oximetry, re-evaluation of patient's condition, obtaining history from patient or surrogate and review of old charts     Initial Impression / Assessment and Plan / MDM / ED Course  59 y.o. female who presents to the ED for generalized weakness, confusion, as above.  Ddx: Wernicke-Korsakoff, electrolyte abnormality, intracranial abnormality, hypoglycemia, elevated ammonia, liver failure  Will evaluate with labs, imaging.  Will give empiric dose of thiamine here.  Abnormal labs notable for sodium 122, chloride 93, ammonia 94, total bilirubin 4.8, INR 1.6. Negative alcohol.  Will give 500 cc bolus NS and then NS infusion rate for correction of sodium, to avoid any rapid over correction. MELD score 27.  Etiology for presentation likely multifactorial, including hepatic encephalopathy with hyperammonemia, decompensated cirrhosis, as well as hyponatremia, possible Warnicke Korsakoff.  We will plan to admit. Discussed w/ hospitalist.    _______________________________  As part of my medical decision making I have reviewed available labs, radiology tests, reviewed old records.   Final Clinical Impression(s) / ED Diagnosis  Final diagnoses:  Confusion  Hyponatremia  Hepatic encephalopathy (HCC)       Note:  This document was prepared using Dragon voice recognition software and may include unintentional dictation errors.   Miguel Aschoff., MD 06/26/19 2052

## 2019-06-26 NOTE — ED Notes (Addendum)
Pt resting on stretcher in hallway with iv fluids infusing without difficulty. Pt states she came to ED because she has been confused. Reports she is still confused. Reports having a slight headache. Answering questions appropriately. Encouraged to use restroom. Verbalized understanding. Provided for comfort and safety and will continue to assess.

## 2019-06-26 NOTE — Telephone Encounter (Signed)
Electronic refill request. Tramadol Last office visit:   06/08/2019 Last Filled:    90 tablet 0 03/24/2019  Please advise.

## 2019-06-26 NOTE — ED Triage Notes (Signed)
Pt here via EMS from home today with c/o weakness in her neck and confusion. States her home care nurse comes to help with mobility, woke her up around 1300 today, pt states she hadn't been out of bed since she went to bed at midnight last night 06/26/19 which was LKW time. Pt has "blank stare" when responding to triage questions, slow to respond and repeating responses. Oriented to person, and place, however, unable to state year. No arm drift, grips equal and weak bilaterally, face symmetrical. Denies pain, denies dizziness, only complaint is neck weakness. NAD.

## 2019-06-26 NOTE — Telephone Encounter (Signed)
Lelon Mast PT with [facility] said she is with pt; pt is having problems answering any questions that pt should know; pt is more confused than Lelon Mast has ever seen pt in last 2 wks; Lelon Mast said pt complaining of being stiff all over; T 97 BP 112/80 P 99 and oxygen level is 99%. Pt is having SOB but no CP. Unable to reach her friend,Ernie after multiple attemps by myself and Lelon Mast; and Samantha asked I call 911. I called 911 and EMS dispatched and Lelon Mast will stay with pt until EMS arrives; if pt condition changes or worsens prior to arrival of EMS Lelon Mast will call 911. FYI to Dr Para March.

## 2019-06-26 NOTE — H&P (Signed)
[facility] [place] NAME: Abigail [name]    MR#:  [id number]  DATE OF BIRTH:  1960-05-10  DATE OF ADMISSION:  06/26/2019  PRIMARY CARE PHYSICIAN: Tonia Ghent, MD   REQUESTING/REFERRING PHYSICIAN: Derrell Lolling, MD  CHIEF COMPLAINT:   Chief Complaint  Patient presents with  . Altered Mental Status  . Weakness    HISTORY OF PRESENT ILLNESS:  Abigail [name]  is a 59 y.o. female with a known history of alcoholism, alcoholic liver disease, hypertension, GERD, pancreatitis, portal vein thrombosis on Eliquis and previous history of COVID-19, presented to emergency room with acute onset of altered mental status with confusion and generalized weakness which have been noticed by her home health nurse today.  The patient stated that she has been like this for a while.  She denies any fever or chills.  She admits to headache without dizziness or blurred vision.  No dysuria, oliguria or hematuria or flank pain.  She denied any excessive water intake.  She denies any recent alcohol intake either.  No cough or wheezing or hemoptysis.  No bleeding diathesis otherwise.  She denies chest pain or palpitations.  No paresthesias or focal muscle weakness.  Upon presentation to the emergency room, heart rate was 106 and vital signs otherwise were within normal.  Labs revealed mild hyper kalemia 5.5 and hyponatremia of 122 with hypochloremia of 93.  LFTs were abnormal with albumin of 1.6 and total protein of 6.5, AST 58 and alk phos 123 with direct bili of 1.4 and indirect bili of 3.4.  CBC showed anemia better than her previous levels.  Alcohol levels less than 10.  INR was 1.6 and PT 19.2.  Noncontrasted head CT scan revealed no acute intracranial normalities.  The patient was given 500 mL IV normal saline bolus followed by 100 mill per hour as well as 100 mg of IV thiamine.  She will be admitted to a medical monitored bed for further evaluation and management.  PAST MEDICAL HISTORY:     Past Medical History:  Diagnosis Date  . Alcoholism ([place])   . Cholecystitis   . COVID-19 virus infection 03/2019  . GERD (gastroesophageal reflux disease)   . Headache    improved with gabapentin   . Hypertension   . Pancreatic insufficiency   . Pancreatitis 10/2015   2013 alcohol and/or gallstone  . Photosensitivity    (No formal dx of lupus) prev eval Dr. Jefm Bryant in [place]  . Portal vein thrombosis 2019    PAST SURGICAL HISTORY:   Past Surgical History:  Procedure Laterality Date  . APPENDECTOMY     As a child  . CAT scan  11/00   wnl  . CHOLECYSTECTOMY  04/20/2012   Procedure: LAPAROSCOPIC CHOLECYSTECTOMY WITH INTRAOPERATIVE CHOLANGIOGRAM;  Surgeon: Merrie Roof, MD;  Location: [facility];  Service: General;  Laterality: N/A;  . COLONOSCOPY  2005  . DOBUTAMINE STRESS ECHO  11/00   wnl  . DOPPLER ECHOCARDIOGRAPHY     Secondary to [place], wnl  . ESOPHAGOGASTRODUODENOSCOPY  8/01   Esophagitis [facility])  . FOOT SURGERY  04/2015   Pin in small toe  . 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 NSVD     X 2  . SBFT  08/01   wnl  . TONSILLECTOMY     As a child  . TUBAL LIGATION  Late 20's    SOCIAL HISTORY:   Social History   Tobacco Use  .  Smoking status: Never Smoker  . Smokeless tobacco: Never Used  Substance Use Topics  . Alcohol use: Yes    Alcohol/week: 3.0 standard drinks    Types: 2 Glasses of wine, 1 Cans of beer per week    Comment: cvouple glsses of wine over weekdend and beer monday     FAMILY HISTORY:   Family History  Problem Relation Age of Onset  . Heart disease Mother        CAD, angioplasty twice with stents  . Cancer Mother 34       Breast  . Breast cancer Mother 26  . Hypertension Mother   . Peripheral Artery Disease Mother   . Hypertension Father   . Hypothyroidism Father   . Heart disease Father        MI at age 32  . Cancer Father 54       H/O renal CA  . Diabetes Sister   . Cancer Sister 77       Renal CA   . Factor V Leiden deficiency Brother   . Clotting disorder Brother   . Colon cancer Neg Hx     DRUG ALLERGIES:   Allergies  Allergen Reactions  . Latex Rash    "hands turn beat red and start to itch"  . Atorvastatin Other (See Comments)    Swollen joints  . Dilaudid [Hydromorphone Hcl] Itching and Other (See Comments)    Itching- no rash- likely side effect but not true allergy.  Tolerates oxycodone.   Loma Sousa [Escitalopram Oxalate] Other (See Comments)    Psychological changes  . Lisinopril Other (See Comments)    Hives, presumed allergy  . Mirtazapine Swelling    swelling  . Nsaids     Gastritis 2013  . Tylenol [Acetaminophen] Other (See Comments)    Hx pancreatitis    REVIEW OF SYSTEMS:   ROS As per history of present illness. All pertinent systems were reviewed above. Constitutional,  HEENT, cardiovascular, respiratory, GI, GU, musculoskeletal, neuro, psychiatric, endocrine,  integumentary and hematologic systems were reviewed and are otherwise  negative/unremarkable except for positive findings mentioned above in the HPI.   MEDICATIONS AT HOME:   Prior to Admission medications   Medication Sig Start Date End Date Taking? Authorizing Provider  albuterol (PROVENTIL HFA;VENTOLIN HFA) 108 (90 Base) MCG/ACT inhaler Inhale 2 puffs into the lungs every 4 (four) hours as needed for wheezing or shortness of breath. 06/20/18  Yes Pleas Koch, NP  apixaban (ELIQUIS) 5 MG TABS tablet Take 2 tablets (10 mg total) by mouth 2 (two) times daily for 6 days. Patient taking differently: Take 5 mg by mouth 2 (two) times daily.  06/11/19 06/26/19 Yes Shelly Coss, MD  apixaban (ELIQUIS) 5 MG TABS tablet Take 1 tablet (5 mg total) by mouth 2 (two) times daily. 06/17/19  Yes Shelly Coss, MD  cyanocobalamin (,VITAMIN B-12,) 1000 MCG/ML injection INJECT 1ML INTRAMUSCULARLY EVERY 14 DAYS Patient taking differently: Inject 1,000 mcg into the muscle every 14 (fourteen) days.   05/15/19  Yes Tonia Ghent, MD  folic acid (FOLVITE) 1 MG tablet Take 1 tablet (1 mg total) by mouth daily. 06/11/19  Yes Shelly Coss, MD  furosemide (LASIX) 40 MG tablet Take 1 tablet (40 mg total) by mouth daily. 06/11/19 06/10/20 Yes Adhikari, Tamsen Meek, MD  gabapentin (NEURONTIN) 300 MG capsule TAKE TWO CAPSULES BY MOUTH TWICE A DAY FOR HEADACHE Patient taking differently: Take 600 mg by mouth 2 (two) times daily.  04/05/19  Yes  Tonia Ghent, MD  lidocaine (XYLOCAINE) 2 % solution Use 5 ml up to 4 times a day as needed. 07/08/18  Yes Tonia Ghent, MD  lipase/protease/amylase (CREON) 36000 UNITS CPEP capsule Take 1 capsule (36,000 Units total) by mouth 3 (three) times daily before meals. 05/22/19  Yes Tonia Ghent, MD  ondansetron (ZOFRAN ODT) 4 MG disintegrating tablet Take 1 tablet (4 mg total) by mouth every 8 (eight) hours as needed for nausea or vomiting. 05/12/19  Yes Tonia Ghent, MD  pantoprazole (PROTONIX) 40 MG tablet Take 1 tablet (40 mg total) by mouth daily. 06/11/19  Yes Shelly Coss, MD  promethazine (PHENERGAN) 12.5 MG tablet TAKE ONE TABLET BY MOUTH EVERY 6 HOURS AS NEEDED FOR NAUSEA AND VOMITING Patient taking differently: Take 12.5 mg by mouth every 6 (six) hours as needed for nausea or vomiting.  03/10/19  Yes Tonia Ghent, MD  thiamine 100 MG tablet Take 1 tablet (100 mg total) by mouth daily. 06/11/19  Yes Shelly Coss, MD  traMADol (ULTRAM) 50 MG tablet TAKE ONE TABLET BY MOUTH EVERY 8 HOUR AS NEEDED FOR PAIN Patient taking differently: Take 50 mg by mouth every 8 (eight) hours as needed for moderate pain.  03/24/19  Yes Tonia Ghent, MD  venlafaxine (EFFEXOR) 75 MG tablet TAKE ONE TABLET BY MOUTH TWICE A DAY Patient taking differently: Take 75 mg by mouth 2 (two) times daily.  03/27/19  Yes Tonia Ghent, MD  Vitamin D, Ergocalciferol, (DRISDOL) 1.25 MG (50000 UNIT) CAPS capsule Take 1 capsule (50,000 Units total) by mouth every 7 (seven) days. 06/11/19   Yes Shelly Coss, MD  Insulin Syringe-Needle U-100 (B-D INSULIN SYRINGE 1CC/25GX1") 25G X 1" 1 ML MISC Inject 1 Syringe into the muscle every 14 (fourteen) days. For B12 injection 11/16/18   Tonia Ghent, MD      VITAL SIGNS:  Blood pressure 109/66, pulse (!) 101, temperature 98.1 F (36.7 C), temperature source Oral, resp. rate 15, height 5' (1.524 m), weight 68 kg, SpO2 97 %.  PHYSICAL EXAMINATION:  Physical Exam  GENERAL:  59 y.o.-year-old Caucasian female patient lying in the bed with no acute distress.  EYES: Pupils equal, round, reactive to light and accommodation. No scleral icterus. Extraocular muscles intact.  HEENT: Head atraumatic, normocephalic. Oropharynx and nasopharynx clear.  NECK:  Supple, no jugular venous distention. No thyroid enlargement, no tenderness.  LUNGS: Normal breath sounds bilaterally, no wheezing, rales,rhonchi or crepitation. No use of accessory muscles of respiration.  CARDIOVASCULAR: Regular rate and rhythm, S1, S2 normal. No murmurs, rubs, or gallops.  ABDOMEN: Soft, distended nontender with mild shifting dullness.. Bowel sounds present. No organomegaly or mass.  EXTREMITIES: 3+ bilateral lower extremity soft pitting edema with no  cyanosis, or clubbing.  NEUROLOGIC: Cranial nerves II through XII are intact. Muscle strength 5/5 in all extremities. Sensation intact. Gait not checked.  PSYCHIATRIC: The patient is alert and oriented x 3.  Normal affect and good eye contact. SKIN: No obvious rash, lesion, or ulcer.   LABORATORY PANEL:   CBC Recent Labs  Lab 06/26/19 1546  WBC 9.6  HGB 10.6*  HCT 30.7*  PLT 257   ------------------------------------------------------------------------------------------------------------------  Chemistries  Recent Labs  Lab 06/26/19 1546 06/26/19 1841  NA 122*  --   K 5.5*  --   CL 93*  --   CO2 23  --   GLUCOSE 96  --   BUN 22*  --   CREATININE 0.91  --  CALCIUM 7.8*  --   AST  --  58*  ALT  --   34  ALKPHOS  --  123  BILITOT  --  4.8*   ------------------------------------------------------------------------------------------------------------------  Cardiac Enzymes No results for input(s): TROPONINI in the last 168 hours. ------------------------------------------------------------------------------------------------------------------  RADIOLOGY:  CT HEAD WO CONTRAST  Result Date: 06/26/2019 CLINICAL DATA:  Focal neuro deficit, greater than 6 hours, stroke suspected. Additional history provided: Weakness and dizziness. EXAM: CT HEAD WITHOUT CONTRAST TECHNIQUE: Contiguous axial images were obtained from the base of the skull through the vertex without intravenous contrast. COMPARISON:  Head CT 07/27/2018 FINDINGS: Brain: There is no evidence of acute intracranial hemorrhage, intracranial mass, midline shift or extra-axial fluid collection.No demarcated cortical infarction. Vascular: No hyperdense vessel. Skull: Normal. Negative for fracture or focal lesion. Sinuses/Orbits: Visualized orbits demonstrate no acute abnormality. No significant paranasal sinus disease or mastoid effusion at the imaged levels. IMPRESSION: No evidence of acute intracranial abnormality. Electronically Signed   By: Kellie Simmering DO   On: 06/26/2019 15:34      IMPRESSION AND PLAN:   1.  Acute encephalopathy likely multifactorial secondary to hepatic encephalopathy as well as hyponatremia. -The patient will be admitted to a medical monitored bed. -She will be placed on p.o. lactulose and will follow daily ammonia level. -She will be hydrated with IV normal saline and will follow her sodium level. -Hyponatremia work-up was sent.  2.  Type 2 diabetes mellitus. -The patient will be placed on supplement coverage with NovoLog.  3.  Peripheral neuropathy. -She will be placed on Neurontin only with improved mental status.  4.  GERD. -We will continue Protonix.  5.  Vitamin B12 deficiency. -We will continue  her vitamin B12.  6.  History of portal vein thrombosis. -We will continue her Eliquis.  7.  DVT prophylaxis. -We will continue Eliquis.    All the records are reviewed and case discussed with ED provider. The plan of care was discussed in details with the patient (and family). I answered all questions. The patient agreed to proceed with the above mentioned plan. Further management will depend upon hospital course.   CODE STATUS: Full code    The patient is admitted to inpatient status due to the intensity of medical problems, potential risks involved and management requirement that will necessitate more than 2 midnights.   TOTAL TIME TAKING CARE OF THIS PATIENTS : 55 minutes.    Christel Mormon M.D on 06/26/2019 at 8:27 PM  [facility]   From 7 PM-7 AM, contact night-coverage [email or web address]  CC: Primary care physician; Tonia Ghent, MD   Note: This dictation was prepared with Dragon dictation along with smaller phrase technology. Any transcriptional errors that result from this process are unintentional.

## 2019-06-27 ENCOUNTER — Other Ambulatory Visit: Payer: Self-pay

## 2019-06-27 ENCOUNTER — Ambulatory Visit: Payer: 59 | Admitting: Family Medicine

## 2019-06-27 DIAGNOSIS — E440 Moderate protein-calorie malnutrition: Secondary | ICD-10-CM

## 2019-06-27 DIAGNOSIS — K709 Alcoholic liver disease, unspecified: Secondary | ICD-10-CM

## 2019-06-27 LAB — URINALYSIS, COMPLETE (UACMP) WITH MICROSCOPIC
Bacteria, UA: NONE SEEN
Bilirubin Urine: NEGATIVE
Glucose, UA: NEGATIVE mg/dL
Ketones, ur: 5 mg/dL — AB
Leukocytes,Ua: NEGATIVE
Nitrite: NEGATIVE
Protein, ur: 30 mg/dL — AB
RBC / HPF: >50 RBC/hpf — ABNORMAL HIGH (ref 0–5)
Specific Gravity, Urine: 1.013 (ref 1.005–1.030)
WBC, UA: NONE SEEN WBC/hpf (ref 0–5)
pH: 6 (ref 5.0–8.0)

## 2019-06-27 LAB — NA AND K (SODIUM & POTASSIUM), RAND UR
Potassium Urine: 51 mmol/L
Sodium, Ur: <10 mmol/L

## 2019-06-27 LAB — COMPREHENSIVE METABOLIC PANEL
ALT: 33 U/L (ref 0–44)
AST: 56 U/L — ABNORMAL HIGH (ref 15–41)
Albumin: 1.4 g/dL — ABNORMAL LOW (ref 3.5–5.0)
Alkaline Phosphatase: 109 U/L (ref 38–126)
Anion gap: 8 (ref 5–15)
BUN: 19 mg/dL (ref 6–20)
CO2: 20 mmol/L — ABNORMAL LOW (ref 22–32)
Calcium: 7.5 mg/dL — ABNORMAL LOW (ref 8.9–10.3)
Chloride: 97 mmol/L — ABNORMAL LOW (ref 98–111)
Creatinine, Ser: 0.73 mg/dL (ref 0.44–1.00)
GFR calc Af Amer: >60 mL/min (ref >60)
GFR calc non Af Amer: >60 mL/min (ref >60)
Glucose, Bld: 84 mg/dL (ref 70–99)
Potassium: 5 mmol/L (ref 3.5–5.1)
Sodium: 125 mmol/L — ABNORMAL LOW (ref 135–145)
Total Bilirubin: 4.5 mg/dL — ABNORMAL HIGH (ref 0.3–1.2)
Total Protein: 5.9 g/dL — ABNORMAL LOW (ref 6.5–8.1)

## 2019-06-27 LAB — CBC
HCT: 30.4 % — ABNORMAL LOW (ref 36.0–46.0)
Hemoglobin: 10.4 g/dL — ABNORMAL LOW (ref 12.0–15.0)
MCH: 34 pg (ref 26.0–34.0)
MCHC: 34.2 g/dL (ref 30.0–36.0)
MCV: 99.3 fL (ref 80.0–100.0)
Platelets: 252 10*3/uL (ref 150–400)
RBC: 3.06 MIL/uL — ABNORMAL LOW (ref 3.87–5.11)
RDW: 15.4 % (ref 11.5–15.5)
WBC: 9.9 10*3/uL (ref 4.0–10.5)
nRBC: 0 % (ref 0.0–0.2)

## 2019-06-27 LAB — OSMOLALITY, URINE: Osmolality, Ur: 369 mOsm/kg (ref 300–900)

## 2019-06-27 LAB — SARS CORONAVIRUS 2 (TAT 6-24 HRS): SARS Coronavirus 2: NEGATIVE

## 2019-06-27 LAB — AMMONIA: Ammonia: 87 umol/L — ABNORMAL HIGH (ref 9–35)

## 2019-06-27 MED ORDER — MIDODRINE HCL 5 MG PO TABS
5.0000 mg | ORAL_TABLET | Freq: Three times a day (TID) | ORAL | Status: DC
  Administered 2019-06-27 – 2019-06-29 (×6): 5 mg via ORAL
  Filled 2019-06-27 (×6): qty 1

## 2019-06-27 MED ORDER — ALBUMIN HUMAN 25 % IV SOLN
12.5000 g | Freq: Three times a day (TID) | INTRAVENOUS | Status: DC
  Administered 2019-06-27 – 2019-06-28 (×5): 12.5 g via INTRAVENOUS
  Filled 2019-06-27 (×9): qty 50

## 2019-06-27 MED ORDER — GABAPENTIN 300 MG PO CAPS: 300.0000 mg | ORAL_CAPSULE | Freq: Every day | ORAL | Status: DC

## 2019-06-27 NOTE — ED Notes (Signed)
Pt hd large BM. Pt cleaned up and repositioned in the bed. Pt given pillow and pillow placed under the pt's legs to elevate them. Pt placed back on monitor. Pt resting at this time.

## 2019-06-27 NOTE — ED Notes (Signed)
Pt reports feeling nauseated. Provided with emesis bag and head of bed elevated. Pt alert and calm at this time.

## 2019-06-27 NOTE — ED Notes (Signed)
Messaged Admit Sherryll Burger concerning pt's 4+ pitting edema. Waiting for reply for further instructions

## 2019-06-27 NOTE — Telephone Encounter (Signed)
I declined this refill as she is currently admitted to the inpatient service.  I will await her discharge summary and we can address this at follow-up if needed.  Thanks.

## 2019-06-27 NOTE — ED Notes (Addendum)
Lactulose with good results of large bowel movement. Dr at the bedside for assessment. Patient with swelling to lower extremities with 2+pitting. Redness to right toes and heel.

## 2019-06-27 NOTE — ED Notes (Addendum)
Pt calling out to have BM. Pt undressed and has noticeable 4+ pitting edema all over. Pt unable to get up to restroom. Pt assisted to use bedpan. Pt placed in clean gown and socks removed at this time due to swelling. Pt given warm blankets. Will message MD regarding fluids and possible medication order for lasix  Fluids paused at this time

## 2019-06-27 NOTE — Progress Notes (Signed)
1        [place] at [facility]   PATIENT NAME: Abigail [name]    MR#:  [id number]  DATE OF BIRTH:  1960-12-24  SUBJECTIVE:  CHIEF COMPLAINT:   Chief Complaint  Patient presents with  . Altered Mental Status  . Weakness  Very weak.  Confused.  Anasarca.  Mentions having a liver and pancreatic issue REVIEW OF SYSTEMS:  ROS unable to obtain due to mental status DRUG ALLERGIES:   Allergies  Allergen Reactions  . Latex Rash    "hands turn beat red and start to itch"  . Atorvastatin Other (See Comments)    Swollen joints  . Dilaudid [Hydromorphone Hcl] Itching and Other (See Comments)    Itching- no rash- likely side effect but not true allergy.  Tolerates oxycodone.   [name] [Escitalopram Oxalate] Other (See Comments)    Psychological changes  . Lisinopril Other (See Comments)    Hives, presumed allergy  . Mirtazapine Swelling    swelling  . Nsaids     Gastritis 2013  . Tylenol [Acetaminophen] Other (See Comments)    Hx pancreatitis   VITALS:  Blood pressure 132/85, pulse 98, temperature 97.7 F (36.5 C), temperature source Oral, resp. rate 16, height 5' (1.524 m), weight 68 kg, SpO2 98 %. PHYSICAL EXAMINATION:  Physical Exam Constitutional:      Appearance: She is ill-appearing.  HENT:     Head: Normocephalic and atraumatic.  Eyes:     Conjunctiva/sclera: Conjunctivae normal.     Pupils: Pupils are equal, round, and reactive to light.  Neck:     Thyroid: No thyromegaly.     Trachea: No tracheal deviation.  Cardiovascular:     Rate and Rhythm: Normal rate and regular rhythm.     Heart sounds: Normal heart sounds.  Pulmonary:     Effort: Pulmonary effort is normal. No respiratory distress.     Breath sounds: Normal breath sounds. No wheezing.  Chest:     Chest wall: No tenderness.  Abdominal:     General: Bowel sounds are normal.     Palpations: Abdomen is soft.     Tenderness: There is no abdominal tenderness.  Musculoskeletal:      General: Normal range of motion.     Cervical back: Normal range of motion and neck supple.     Right lower leg: Edema present.     Left lower leg: Edema present.  Skin:    General: Skin is warm and dry.     Findings: No rash.  Neurological:     Mental Status: She is lethargic and confused.     Cranial Nerves: No cranial nerve deficit.    LABORATORY PANEL:  Female CBC Recent Labs  Lab 06/27/19 0500  WBC 9.9  HGB 10.4*  HCT 30.4*  PLT 252   ------------------------------------------------------------------------------------------------------------------ Chemistries  Recent Labs  Lab 06/27/19 0500  NA 125*  K 5.0  CL 97*  CO2 20*  GLUCOSE 84  BUN 19  CREATININE 0.73  CALCIUM 7.5*  AST 56*  ALT 33  ALKPHOS 109  BILITOT 4.5*   RADIOLOGY:  CT HEAD WO CONTRAST  Result Date: 06/26/2019 CLINICAL DATA:  Focal neuro deficit, greater than 6 hours, stroke suspected. Additional history provided: Weakness and dizziness. EXAM: CT HEAD WITHOUT CONTRAST TECHNIQUE: Contiguous axial images were obtained from the base of the skull through the vertex without intravenous contrast. COMPARISON:  Head CT 07/27/2018 FINDINGS: Brain: There is no evidence of acute intracranial hemorrhage,  intracranial mass, midline shift or extra-axial fluid collection.No demarcated cortical infarction. Vascular: No hyperdense vessel. Skull: Normal. Negative for fracture or focal lesion. Sinuses/Orbits: Visualized orbits demonstrate no acute abnormality. No significant paranasal sinus disease or mastoid effusion at the imaged levels. IMPRESSION: No evidence of acute intracranial abnormality. Electronically Signed   By: Kellie Simmering DO   On: 06/26/2019 15:34   ASSESSMENT AND PLAN:  59 y.o. female with a known history of alcoholism, alcoholic liver disease, gastric bypass surgery 2005 at [facility], hypertension, portal vein thrombosis 2019, GERD, chronic pancreatitis, portal vein thrombosis on Eliquis and previous history  of COVID-19 in March 08, 2019, presented to emergency room with acute onset of altered mental status with confusion and generalized weakness which have been noticed by her home health nurse.    Please note patient was recently admitted at [facility] from 3/4-3/7 for similar presentation.  Acute metabolic encephalopathy likely multifactorial secondary to hepatic encephalopathy as well as hyponatremia. -Continue lactulose and will follow daily ammonia level. -We will consult GI - Dr. Vicente Males aware and will see her on 3/24  Generalized weakness -Multifactorial:Liver cirrhosis, chronic pancreatitis, poor appetite, low electrolytes, recent COVID-19 infection  History of portal venous thrombosis in 2019 Factor V Leyden disease -She has seen hematologist at [facility] but don't recall name -Ultrasound duplex of lower extremities on 3/5 showed bilateral DVT.Right popliteal DVT and left common femoral and left femoral vein DVT.  -on Eliquis   Alcoholic liver disease/generalized anasarca -History of chronic alcoholism. Extensive fatty liver and CT scan from November 2020. Ultrasound abdomen with findings of early liver cirrhosis. -Liver enzymes are slightly abnormal. INR elevated at 1.6. Albumin significantly low at 1.4 -Clinically has 3+ bilateral pedal edema. No evidence of SBP. -May need Lasix drip along with albumin considering generalized anasarca -GI and nephrology consultation requested - She had gastric bypass surgery done in 2005 at [facility] and has been taking nutritional supplements. She used to be a heavy drinker until a year ago and has chronic pancreatitis,gets intermittent nausea, vomiting and takes pancreatic enzyme supplements.   Hyponatremia/hyperkalemia -Suspect hypervolemic hyponatremia.  -On admission, sodium was 122->125 -Consider Lasix once nephrology evaluates  Moderate Protein calorie malnutrition -Albumin level low 1.4. Her baseline albumin level already seems low  between 2-2.5. Likely related to liver disease and poor intake. -Nutrition consulted.  History of gastric bypass surgery 2005 -Probably followed by nutritional deficiencies. 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 pancreatic enzymes   Recent COVID-19 infection -Did not need hospitalization at that time. She took outpatient monoclonal antibody infusion. -The only residual symptom seems to be burning sensation in the mouth with any food.   Type 2 diabetes mellitus. -Sliding scale insulin   Peripheral neuropathy. -She will be placed on Neurontin only with improved mental status.   GERD. - continue Protonix.  History of portal vein thrombosis and bilateral DVT - continue her Eliquis.  Apparently, She is to report to [place] in [place] on Friday at 6 PM.  Unsure of all the details   DVT prophylaxis: Eliquis Family Communication: discussed with patient Disposition Plan: Home environment Barriers to DC -clinical condition improvement, GI and nephrology evaluation  All the records are reviewed and case discussed with Care Management/Social Worker. Management plans discussed with the patient, nursing and they are in agreement.  CODE STATUS: Full Code  TOTAL TIME TAKING CARE OF THIS PATIENT: 1  minutes.   More than 50% of the time was spent in counseling/coordination of care: YES  POSSIBLE D/C IN 2-3 DAYS, DEPENDING ON CLINICAL CONDITION.   Delfino Lovett M.D on 06/27/2019 at 3:19 PM  [facility]   CC: Primary care physician; Joaquim Nam, MD  Note: This dictation was prepared with Dragon dictation along with smaller phrase technology. Any transcriptional errors that result from this process are unintentional.

## 2019-06-27 NOTE — TOC Progression Note (Signed)
Transition of Care [facility]) - Progression Note    Patient Details  Name: Abigail [name] MRN: [id number] Date of Birth: 1960-06-30  Transition of Care [facility]) CM/SW [place], RN Phone Number: 06/27/2019, 3:15 PM  Clinical Narrative:     Met with the patient and her boyfriend in the room to discuss needs, She is open with [facility] for PT services.  She is to report to [place] in [place] on Friday at 6 PM,  The patient requested a letter on letterhead explaining that she is in the hospital [facility],  The patient nor her boyfriend knows which jail in [place] it is. I provided the patient with a letter on letterhead showing the patient was admitted to the hospital and the diagnosis also included that she is seen by [facility] for [facility] services. Will continue to monitor the patient for needs       Expected Discharge Plan and Services                                                 Social Determinants of Health (SDOH) Interventions    Readmission Risk Interventions No flowsheet data found.

## 2019-06-27 NOTE — ED Notes (Signed)
This tech set up pt breakfast tray and offered it to her. Pt not wanting anything to eat at this time but will eat when she is ready. Pt placed back on cardiac monitor and call light placed on bed rail.

## 2019-06-27 NOTE — ED Notes (Signed)
Received message back from [employer], d/c the fluids and he will see her in his rounds today.

## 2019-06-27 NOTE — ED Notes (Signed)
Pt given breakfast tray

## 2019-06-27 NOTE — Consult Note (Signed)
[address] [place], [place] [address] Phone [phone]. Fax [phone]  Date: 06/27/2019                  Patient Name:  Abigail [name]  MRN: [id number]  DOB: 04-Feb-1961  Age / Sex: 59 y.o., female         PCP: Joaquim Nam, MD                 Service Requesting Consult: IM/ Delfino Lovett, MD                 Reason for Consult: Hyponatremia            History of Present Illness: Patient is a 59 y.o. female with medical problems of alcoholic liver disease, who was admitted to [facility] on 06/26/2019 for evaluation of Generalized weakness.  Nephrology consult has been requested for evaluation of hyponatremia.  Patient has known history of alcoholism.  She admits to drinking large amount of liquor almost a bottle a day.  States she quit about a month ago Last normal sodium was on May 12, 2019 at 138.  Creatinine normal 0.49 Admit sodium of 122, creatinine 0.91 which is twice her baseline Albumin is low at 1.6 Patient has generalized edema Initially she was given volume resuscitation with normal saline.  Sodium level has improved slightly to 125.  Nephrology consult is now requested for evaluation of hyponatremia and edema.  Medications: Outpatient medications: Medications Prior to Admission  Medication Sig Dispense Refill Last Dose  . albuterol (PROVENTIL HFA;VENTOLIN HFA) 108 (90 Base) MCG/ACT inhaler Inhale 2 puffs into the lungs every 4 (four) hours as needed for wheezing or shortness of breath. 1 Inhaler 0   . apixaban (ELIQUIS) 5 MG TABS tablet Take 2 tablets (10 mg total) by mouth 2 (two) times daily for 6 days. (Patient taking differently: Take 5 mg by mouth 2 (two) times daily. ) 24 tablet 0   . apixaban (ELIQUIS) 5 MG TABS tablet Take 1 tablet (5 mg total) by mouth 2 (two) times daily. 60 tablet 0   . cyanocobalamin (,VITAMIN B-12,) 1000 MCG/ML injection INJECT INTRAMUSCULARLY EVERY 14 DAYS (Patient taking differently: Inject 1,000 mcg into the muscle  every 14 (fourteen) days. ) 1 mL 2   . folic acid (FOLVITE) 1 MG tablet Take 1 tablet (1 mg total) by mouth daily. 30 tablet 1   . furosemide (LASIX) 40 MG tablet Take 1 tablet (40 mg total) by mouth daily. 30 tablet 1   . gabapentin (NEURONTIN) 300 MG capsule TAKE TWO CAPSULES BY MOUTH TWICE A DAY FOR HEADACHE (Patient taking differently: Take 600 mg by mouth 2 (two) times daily. ) 120 capsule 3   . lidocaine (XYLOCAINE) 2 % solution Use 5 ml up to 4 times a day as needed. 100 mL 0   . lipase/protease/amylase (CREON) 36000 UNITS CPEP capsule Take 1 capsule (36,000 Units total) by mouth 3 (three) times daily before meals. 90 capsule 1   . ondansetron (ZOFRAN ODT) 4 MG disintegrating tablet Take 1 tablet (4 mg total) by mouth every 8 (eight) hours as needed for nausea or vomiting. 20 tablet 1   . pantoprazole (PROTONIX) 40 MG tablet Take 1 tablet (40 mg total) by mouth daily. 60 tablet 5   . promethazine (PHENERGAN) 12.5 MG tablet TAKE ONE TABLET BY MOUTH EVERY 6 HOURS AS NEEDED FOR NAUSEA AND VOMITING (Patient taking differently: Take 12.5 mg by mouth every 6 (six) hours  as needed for nausea or vomiting. ) 30 tablet 1   . thiamine 100 MG tablet Take 1 tablet (100 mg total) by mouth daily. 30 tablet 1   . traMADol (ULTRAM) 50 MG tablet TAKE ONE TABLET BY MOUTH EVERY 8 HOUR AS NEEDED FOR PAIN (Patient taking differently: Take 50 mg by mouth every 8 (eight) hours as needed for moderate pain. ) 90 tablet 0   . venlafaxine (EFFEXOR) 75 MG tablet TAKE ONE TABLET BY MOUTH TWICE A DAY (Patient taking differently: Take 75 mg by mouth 2 (two) times daily. ) 180 tablet 1   . Vitamin D, Ergocalciferol, (DRISDOL) 1.25 MG (50000 UNIT) CAPS capsule Take 1 capsule (50,000 Units total) by mouth every 7 (seven) days. 10 capsule 0   . Insulin Syringe-Needle U-100 (B-D INSULIN SYRINGE 1CC/25GX1") 25G X 1" 1 ML MISC Inject 1 Syringe into the muscle every 14 (fourteen) days. For B12 injection 25 each 0     Current  medications: Current Facility-Administered Medications  Medication Dose Route Frequency Provider Last Rate Last Admin  . 0.9 %  sodium chloride infusion   Intravenous Continuous Mansy, Vernetta Honey, MD   Stopped at 06/27/19 [phone]  . acetaminophen (TYLENOL) tablet 650 mg  650 mg Oral Q6H PRN Mansy, Jan A, MD       Or  . acetaminophen (TYLENOL) suppository 650 mg  650 mg Rectal Q6H PRN Mansy, Jan A, MD      . albuterol (PROVENTIL) (2.5 MG/3ML) 0.083% nebulizer solution 2.5 mg  2.5 mg Inhalation Q4H PRN Mansy, Jan A, MD      . apixaban (ELIQUIS) tablet 5 mg  5 mg Oral BID Mansy, Jan A, MD   5 mg at 06/27/19 1057  . [START ON 06/28/2019] cyanocobalamin ((VITAMIN B-12)) injection 1,000 mcg  1,000 mcg Intramuscular Q14 Days Mansy, Jan A, MD      . folic acid (FOLVITE) tablet 1 mg  1 mg Oral Daily Mansy, Jan A, MD   1 mg at 06/27/19 1057  . gabapentin (NEURONTIN) capsule 600 mg  600 mg Oral BID Mansy, Jan A, MD   600 mg at 06/27/19 1057  . lactulose (CHRONULAC) 10 GM/15ML solution 30 g  30 g Oral TID Mansy, Jan A, MD   30 g at 06/27/19 1057  . lipase/protease/amylase (CREON) capsule 36,000 Units  36,000 Units Oral TID [facility] Mansy, Vernetta Honey, MD   36,000 Units at 06/27/19 1300  . magnesium hydroxide (MILK OF MAGNESIA) suspension 30 mL  30 mL Oral Daily PRN Mansy, Jan A, MD      . multivitamin with minerals tablet 1 tablet  1 tablet Oral Daily Mansy, Jan A, MD   1 tablet at 06/27/19 1057  . ondansetron (ZOFRAN) tablet 4 mg  4 mg Oral Q6H PRN Mansy, Jan A, MD       Or  . ondansetron [facility]) injection 4 mg  4 mg Intravenous Q6H PRN Mansy, Jan A, MD   4 mg at 06/27/19 0421  . pantoprazole (PROTONIX) EC tablet 40 mg  40 mg Oral Daily Mansy, Jan A, MD   40 mg at 06/27/19 1057  . promethazine (PHENERGAN) tablet 12.5 mg  12.5 mg Oral Q6H PRN Mansy, Jan A, MD      . sodium chloride flush (NS) 0.9 % injection 3 mL  3 mL Intravenous Once Miguel Aschoff., MD      . thiamine tablet 100 mg  100 mg Oral Daily Mansy, Vernetta Honey, MD  100  mg at 06/27/19 1057  . traMADol (ULTRAM) tablet 50 mg  50 mg Oral Q8H PRN Mansy, Jan A, MD      . traZODone (DESYREL) tablet 25 mg  25 mg Oral QHS PRN Mansy, Jan A, MD      . venlafaxine [facility]) tablet 75 mg  75 mg Oral BID Mansy, Jan A, MD   75 mg at 06/27/19 1306  . [START ON 06/28/2019] Vitamin D (Ergocalciferol) (DRISDOL) capsule 50,000 Units  50,000 Units Oral Q7 days Mansy, Vernetta Honey, MD          Allergies: Allergies  Allergen Reactions  . Latex Rash    "hands turn beat red and start to itch"  . Atorvastatin Other (See Comments)    Swollen joints  . Dilaudid [Hydromorphone Hcl] Itching and Other (See Comments)    Itching- no rash- likely side effect but not true allergy.  Tolerates oxycodone.   [name] [Escitalopram Oxalate] Other (See Comments)    Psychological changes  . Lisinopril Other (See Comments)    Hives, presumed allergy  . Mirtazapine Swelling    swelling  . Nsaids     Gastritis 2013  . Tylenol [Acetaminophen] Other (See Comments)    Hx pancreatitis      Past Medical History: Past Medical History:  Diagnosis Date  . Alcoholism (HCC)   . Cholecystitis   . COVID-19 virus infection 03/2019  . GERD (gastroesophageal reflux disease)   . Headache    improved with gabapentin   . Hypertension   . Pancreatic insufficiency   . Pancreatitis 10/2015   2013 alcohol and/or gallstone  . Photosensitivity    (No formal dx of lupus) prev eval Dr. Gavin Potters in [place]  . Portal vein thrombosis 2019     Past Surgical History: Past Surgical History:  Procedure Laterality Date  . APPENDECTOMY     As a child  . CAT scan  11/00   wnl  . CHOLECYSTECTOMY  04/20/2012   Procedure: LAPAROSCOPIC CHOLECYSTECTOMY WITH INTRAOPERATIVE CHOLANGIOGRAM;  Surgeon: Robyne Askew, MD;  Location: [facility] OR;  Service: General;  Laterality: N/A;  . COLONOSCOPY  2005  . DOBUTAMINE STRESS ECHO  11/00   wnl  . DOPPLER ECHOCARDIOGRAPHY     Secondary to Redux, wnl  .  ESOPHAGOGASTRODUODENOSCOPY  8/01   Esophagitis [name])  . FOOT SURGERY  04/2015   Pin in small toe  . 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 NSVD     X 2  . SBFT  08/01   wnl  . TONSILLECTOMY     As a child  . TUBAL LIGATION  Late 20's     Family History: Family History  Problem Relation Age of Onset  . Heart disease Mother        CAD, angioplasty twice with stents  . Cancer Mother 68       Breast  . Breast cancer Mother 2  . Hypertension Mother   . Peripheral Artery Disease Mother   . Hypertension Father   . Hypothyroidism Father   . Heart disease Father        MI at age 53  . Cancer Father 92       H/O renal CA  . Diabetes Sister   . Cancer Sister 56       Renal CA  . Factor V Leiden deficiency Brother   . Clotting disorder Brother   . Colon cancer Neg Hx  Social History: Social History   Socioeconomic History  . Marital status: Divorced    Spouse name: Not on file  . Number of children: 2  . Years of education: 3214  . Highest education level: Not on file  Occupational History  . 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 Smoking status: Never Smoker  . Smokeless tobacco: Never Used  Substance and Sexual Activity  . Alcohol use: Yes    Alcohol/week: 3.0 standard drinks    Types: 2 Glasses of wine, 1 Cans of beer per week    Comment: cvouple glsses of wine over weekdend and beer monday   . Drug use: No  . Sexual activity: Not on file  Other Topics Concern  . Not on file  Social History Narrative   Divorced, 2 grown children, lives with companion   Dental Hygienist   Caffeine use- coffee in morning   Social Determinants of Health   Financial Resource Strain:   . Difficulty of Paying Living Expenses:   Food Insecurity:   . Worried About Programme researcher, broadcasting/film/videounning Out of Food in the Last Year:   . Baristaan Out of Food in the Last Year:   Transportation Needs:   . Freight forwarderLack of Transportation (Medical):   Marland Kitchen. Lack of  Transportation (Non-Medical):   Physical Activity:   . Days of Exercise per Week:   . Minutes of Exercise per Session:   Stress:   . Feeling of Stress :   Social Connections:   . Frequency of Communication with Friends and Family:   . Frequency of Social Gatherings with Friends and Family:   . Attends Religious Services:   . Active Member of Clubs or Organizations:   . Attends BankerClub or Organization Meetings:   Marland Kitchen. Marital Status:   Intimate Partner Violence:   . Fear of Current or Ex-Partner:   . Emotionally Abused:   Marland Kitchen. Physically Abused:   . Sexually Abused:      Review of Systems: Gen: Generalized weakness, no fevers or chills HEENT: No vision or hearing complaints CV: No chest pain or shortness of breath. Resp: No cough or sputum production GI: Appetite is poor.  States she eats half a meal a day. GU : Able to void without problem.  No blood in the urine MS: Has difficulty walking due to pressure on the legs Derm:    No complaint Psych: No complaints Heme: No complaints Neuro: No complaint Endocrine.  No complaints  Vital Signs: Blood pressure 132/85, pulse 98, temperature 97.7 F (36.5 C), temperature source Oral, resp. rate 16, height 5' (1.524 m), weight 68 kg, SpO2 98 %.  No intake or output data in the 24 hours ending 06/27/19 1522  Weight trends: Filed Weights   06/26/19 1453 06/27/19 0754  Weight: 68 kg 68 kg    Physical Exam: General:  Laying in the bed, no acute distress  HEENT  anicteric, moist oral mucous membrane  Lungs:  Normal breathing effort on room air, clear to auscultation  Heart::  Tachycardic, regular  Abdomen:  Soft, mildly distended  Extremities:  2-3+ generalized pitting edema, upto lower abdomen  Neurologic:  Sleepy but able to answer questions  Skin:  No acute rashes       Lab results: Basic Metabolic Panel: Recent Labs  Lab 06/26/19 1546 06/27/19 0500  NA 122* 125*  K 5.5* 5.0  CL 93* 97*  CO2 23 20*  GLUCOSE 96 84  BUN  22*  19  CREATININE 0.91 0.73  CALCIUM 7.8* 7.5*    Liver Function Tests: Recent Labs  Lab 06/27/19 0500  AST 56*  ALT 33  ALKPHOS 109  BILITOT 4.5*  PROT 5.9*  ALBUMIN 1.4*   No results for input(s): LIPASE, AMYLASE in the last 168 hours. Recent Labs  Lab 06/27/19 0500  AMMONIA 87*    CBC: Recent Labs  Lab 06/26/19 1546 06/27/19 0500  WBC 9.6 9.9  HGB 10.6* 10.4*  HCT 30.7* 30.4*  MCV 98.1 99.3  PLT 257 252    Cardiac Enzymes: No results for input(s): CKTOTAL, TROPONINI in the last 168 hours.  BNP: Invalid input(s): POCBNP  CBG: Recent Labs  Lab 06/26/19 1500  GLUCAP 81    Microbiology: Recent Results (from the past 720 hour(s))  SARS CORONAVIRUS 2 (TAT 6-24 HRS) Nasopharyngeal Nasopharyngeal Swab     Status: None   Collection Time: 06/26/19  8:19 PM   Specimen: Nasopharyngeal Swab  Result Value Ref Range Status   SARS Coronavirus 2 NEGATIVE NEGATIVE Final    Comment: (NOTE) SARS-CoV-2 target nucleic acids are NOT DETECTED. The SARS-CoV-2 RNA is generally detectable in upper and lower respiratory specimens during the acute phase of infection. Negative results do not preclude SARS-CoV-2 infection, do not rule out co-infections with other pathogens, and should not be used as the sole basis for treatment or other patient management decisions. Negative results must be combined with clinical observations, patient history, and epidemiological information. The expected result is Negative. Fact Sheet for Patients: HairSlick.no Fact Sheet for Healthcare Providers: quierodirigir.com This test is not yet approved or cleared by the [place] FDA and  has been authorized for detection and/or diagnosis of SARS-CoV-2 by FDA under an Emergency Use Authorization (EUA). This EUA will remain  in effect (meaning this test can be used) for the duration of the COVID-19 declaration under Section 56 4(b)(1) of  the Act, 21 U.S.C. section 360bbb-3(b)(1), unless the authorization is terminated or revoked sooner. Performed at [facility], [address], [place], [place] [address]      Coagulation Studies: Recent Labs    06/26/19 1546  LABPROT 19.2*  INR 1.6*    Urinalysis: Recent Labs    06/27/19 0500  COLORURINE AMBER*  LABSPEC 1.013  PHURINE 6.0  GLUCOSEU NEGATIVE  HGBUR LARGE*  BILIRUBINUR NEGATIVE  KETONESUR 5*  PROTEINUR 30*  NITRITE NEGATIVE  LEUKOCYTESUR NEGATIVE        Imaging: CT HEAD WO CONTRAST  Result Date: 06/26/2019 CLINICAL DATA:  Focal neuro deficit, greater than 6 hours, stroke suspected. Additional history provided: Weakness and dizziness. EXAM: CT HEAD WITHOUT CONTRAST TECHNIQUE: Contiguous axial images were obtained from the base of the skull through the vertex without intravenous contrast. COMPARISON:  Head CT 07/27/2018 FINDINGS: Brain: There is no evidence of acute intracranial hemorrhage, intracranial mass, midline shift or extra-axial fluid collection.No demarcated cortical infarction. Vascular: No hyperdense vessel. Skull: Normal. Negative for fracture or focal lesion. Sinuses/Orbits: Visualized orbits demonstrate no acute abnormality. No significant paranasal sinus disease or mastoid effusion at the imaged levels. IMPRESSION: No evidence of acute intracranial abnormality. Electronically Signed   By: Jackey Loge DO   On: 06/26/2019 15:34      Assessment & Plan: Pt is a 59 y.o.  Caucasian female with With history of alcoholism, alcoholic liver disease, hypertension, GERD, pancreatitis, portal vein thrombosis, history of factor V Leiden per patient, previous history of COVID-19, was admitted on 06/26/2019 with Hepatic encephalopathy (HCC) [K72.90]  Confusion [R41.0] Hyponatremia [E87.1]  Patient has volume overload and generalized anasarca likely from underlying liver cirrhosis and hypoalbuminemia.  Right upper quadrant imaging by ultrasound from  June 08, 2019 shows fatty liver with findings of early cirrhosis.  Patent main portal vein with hepatopetal flow.  1.  Cirrhosis of the liver with ascites and generalized anasarca, hypoalbuminemia 2.  Hyponatremia 3.  Acute kidney injury 4.  Hematuria noted on urinalysis June 27, 2019  Plan: Discontinue IV normal saline IV albumin supplementation 12.5 g 3 times a day Add midodrine for blood pressure support.  Avoid hypotension We will consider adding IV furosemide tomorrow Screening ANA but hematuria likely secondary to anticoagulation and coagulopathy related to liver    LOS: 1 Matilyn Fehrman 3/23/20213:22 PM    Note: This note was prepared with Dragon dictation. Any transcription errors are unintentional

## 2019-06-27 NOTE — ED Notes (Addendum)
Pt resting on stretcher in hallway. Iv fluids infusing. States she is feeling much better and that her headache has improved. Pt alert and answering questions without difficulty. No acute distress noted.

## 2019-06-27 NOTE — ED Notes (Signed)
Pt to restroom via wheelchair with assistance from ED tech. Pt unsteady and c/o continued nausea. Provided for safety and comfort. Will continue to assess.

## 2019-06-27 NOTE — Progress Notes (Signed)
D: Pt oriented x 4.  Pt denies experiencing any pain at this time. Pt observed as being very drowsy.  A: Scheduled medications administered to pt, per MD orders. Support and encouragement provided. Frequent verbal contact made.   R: No adverse drug reactions noted. Pt complaint with medications and treatment plan. Pt interacts well with staff on the unit. Pt is stable at this time, Will continue to monitor and provide care for as ordered.

## 2019-06-27 NOTE — ED Notes (Signed)
Patient asked this RN to update her boyfriend Ernie.  This RN called Ernie and gave him and update and notified him of new visitation policy and that he can visit.

## 2019-06-27 NOTE — Telephone Encounter (Signed)
Noted.  Thanks.  See ER note.  Admitted.

## 2019-06-27 NOTE — ED Notes (Signed)
Called Clydie Braun in pharmacy requesting pt's medication .

## 2019-06-28 ENCOUNTER — Inpatient Hospital Stay: Payer: 59

## 2019-06-28 DIAGNOSIS — E43 Unspecified severe protein-calorie malnutrition: Secondary | ICD-10-CM | POA: Insufficient documentation

## 2019-06-28 DIAGNOSIS — E8809 Other disorders of plasma-protein metabolism, not elsewhere classified: Secondary | ICD-10-CM

## 2019-06-28 DIAGNOSIS — R410 Disorientation, unspecified: Secondary | ICD-10-CM

## 2019-06-28 DIAGNOSIS — K769 Liver disease, unspecified: Secondary | ICD-10-CM

## 2019-06-28 DIAGNOSIS — E871 Hypo-osmolality and hyponatremia: Secondary | ICD-10-CM

## 2019-06-28 DIAGNOSIS — K7030 Alcoholic cirrhosis of liver without ascites: Secondary | ICD-10-CM

## 2019-06-28 LAB — COMPREHENSIVE METABOLIC PANEL
ALT: 29 U/L (ref 0–44)
AST: 55 U/L — ABNORMAL HIGH (ref 15–41)
Albumin: 1.6 g/dL — ABNORMAL LOW (ref 3.5–5.0)
Alkaline Phosphatase: 100 U/L (ref 38–126)
Anion gap: 5 (ref 5–15)
BUN: 23 mg/dL — ABNORMAL HIGH (ref 6–20)
CO2: 23 mmol/L (ref 22–32)
Calcium: 8 mg/dL — ABNORMAL LOW (ref 8.9–10.3)
Chloride: 100 mmol/L (ref 98–111)
Creatinine, Ser: 0.76 mg/dL (ref 0.44–1.00)
GFR calc Af Amer: >60 mL/min (ref >60)
GFR calc non Af Amer: >60 mL/min (ref >60)
Glucose, Bld: 88 mg/dL (ref 70–99)
Potassium: 4.4 mmol/L (ref 3.5–5.1)
Sodium: 128 mmol/L — ABNORMAL LOW (ref 135–145)
Total Bilirubin: 3.2 mg/dL — ABNORMAL HIGH (ref 0.3–1.2)
Total Protein: 5.8 g/dL — ABNORMAL LOW (ref 6.5–8.1)

## 2019-06-28 LAB — CBC
HCT: 26.6 % — ABNORMAL LOW (ref 36.0–46.0)
Hemoglobin: 9 g/dL — ABNORMAL LOW (ref 12.0–15.0)
MCH: 33.6 pg (ref 26.0–34.0)
MCHC: 33.8 g/dL (ref 30.0–36.0)
MCV: 99.3 fL (ref 80.0–100.0)
Platelets: 267 10*3/uL (ref 150–400)
RBC: 2.68 MIL/uL — ABNORMAL LOW (ref 3.87–5.11)
RDW: 15.2 % (ref 11.5–15.5)
WBC: 9.1 10*3/uL (ref 4.0–10.5)
nRBC: 0.2 % (ref 0.0–0.2)

## 2019-06-28 LAB — AMMONIA: Ammonia: 36 umol/L — ABNORMAL HIGH (ref 9–35)

## 2019-06-28 LAB — VITAMIN B12: Vitamin B-12: >7500 pg/mL — ABNORMAL HIGH (ref 180–914)

## 2019-06-28 LAB — VITAMIN D 25 HYDROXY (VIT D DEFICIENCY, FRACTURES): Vit D, 25-Hydroxy: 26.84 ng/mL — ABNORMAL LOW (ref 30–100)

## 2019-06-28 MED ORDER — ENSURE ENLIVE PO LIQD
237.0000 mL | Freq: Three times a day (TID) | ORAL | Status: DC
  Administered 2019-06-28 (×2): 237 mL via ORAL

## 2019-06-28 MED ORDER — FUROSEMIDE 10 MG/ML IJ SOLN
20.0000 mg | Freq: Three times a day (TID) | INTRAMUSCULAR | Status: DC
  Administered 2019-06-28 – 2019-06-29 (×3): 20 mg via INTRAVENOUS
  Filled 2019-06-28 (×3): qty 4

## 2019-06-28 MED ORDER — PRO-STAT SUGAR FREE PO LIQD
30.0000 mL | Freq: Three times a day (TID) | ORAL | Status: DC
  Administered 2019-06-28 – 2019-06-29 (×3): 30 mL via ORAL

## 2019-06-28 NOTE — Progress Notes (Signed)
Initial Nutrition Assessment  DOCUMENTATION CODES:   Severe malnutrition in context of chronic illness  INTERVENTION:  Recommend liberalizing diet to regular.  Provide Ensure Enlive po TID between meals, each supplement provides 350 kcal and 20 grams of protein.   Provide Pro-Stat 30 mL po TID with meals, each supplement provides 100 kcal and 15 grams of protein.  Pending vitamin/mineral labs for vitamin A, vitamin B12, vitamin C, vitamin E, vitamin K, vitamin D, copper, and zinc. Will monitor for results. Patient will require supplementation per the 2016 American Society for Metabolic and Bariatric Surgery Integrated Health Nutritional Guidelines for the Surgical Weight Loss Patient update on micronutrients.  NUTRITION DIAGNOSIS:   Severe Malnutrition related to chronic illness(hx Roux-en-Y gastric bypass, chronic pancreatitis, hx EtOH abuse) as evidenced by severe fat depletion, severe muscle depletion, edema.  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   Met with patient at bedside. She seemed sleepy and confused but was able to provide some history. She reports she has had a poor appetite for a while now. She endorses anorexia (loss of appetite) and early satiety. She reports she tries to eat meals but can only eat very small amounts. When asked what type of foods she eats at home she only reports fruit. Attempted to ask probing  questions about other intake including high-protein foods but patient was confused. Unsure if that means she only eats fruit or if she is just too confused to provide detailed food history at this time. She reports she takes vitamins/minerals but is unable to report which ones or brands so unsure if they are adequate intake for s/p gastric bypass.   Patient denies any weight loss but is unable to report her UBW. According to chart she was 82.5 kg on 03/18/2018 and weight has been slowly trending down. She was 72.7 kg on 07/08/2018 and lost down to 65.8 kg on 03/06/2019. That is a weight loss of 6.9 kg (9.5% body weight) over almost 8 months, which is not significant for time frame. However, with severe edema/anasarca patient has likely lost more weight that is currently being masked.  Medications reviewed and include: Eliquis, vitamin B12 1000 micrograms IM every 14 days, folic acid 1 mg daily, Lasix 20 mg TID IV, lactulose 30 grams TID, Creon 36000 units TID, MVI daily, pantoprazole, thiamine 100 mg daily, vitamin D 50000 units daily, human albumin 12.5 grams TID IV.  Labs reviewed: Sodium 128, BUN 23.  NUTRITION - FOCUSED PHYSICAL EXAM:    Most Recent Value  Orbital Region  Severe depletion  Upper Arm Region  Severe depletion  Thoracic and Lumbar Region  Unable to assess  Buccal Region  Severe depletion  Temple Region  Moderate depletion  Clavicle Bone Region  Severe depletion  Clavicle and Acromion Bone Region  Severe depletion  Scapular Bone Region  Severe depletion  Dorsal Hand  Moderate depletion  Patellar Region  Unable to assess [edema]  Anterior Thigh Region  Unable to assess [edema]  Posterior Calf Region  Unable to  assess [edema]  Edema (RD Assessment)  Severe  Hair  Reviewed  Eyes  Reviewed  Mouth  Reviewed  Skin  Reviewed [ecchymosis and petechiae]  Nails  Reviewed     Diet Order:   Diet Order            Diet heart healthy/carb modified Room service appropriate? Yes;  Fluid consistency: Thin  Diet effective now             EDUCATION NEEDS:   No education needs have been identified at this time  Skin:  Skin Assessment: Reviewed RN Assessment  Last BM:  06/28/2019 large type 7  Height:   Ht Readings from Last 1 Encounters:  06/27/19 5' (1.524 m)   Weight:   Wt Readings from Last 1 Encounters:  06/27/19 68 kg   Ideal Body Weight:  45.5 kg  BMI:  Body mass index is 29.3 kg/m.  Estimated Nutritional Needs:   Kcal:  2000-2300  Protein:  100-115 grams  Fluid:  1.5-1.8 L/day  Jacklynn Barnacle, MS, RD, LDN Pager number available on Amion

## 2019-06-28 NOTE — Evaluation (Signed)
Physical Therapy Evaluation Patient Details Name: Abigail [name] MRN: [id number] DOB: Jul 04, 1960 Today's Date: 06/28/2019   History of Present Illness  Abigail [name] is a 63yoF who comes to [facility] on 3/22 c AMS, confusion, weakness. Pt admitted with hepatic encphalopathy. PMH: alcoholism, alcoholic liver disease, hypertension, GERD, pancreatitis, portal vein thrombosis on Eliquis and previous history of COVID-19.  Clinical Impression  Pt admitted with above diagnosis. Pt currently with functional limitations due to the deficits listed below (see "PT Problem List"). Upon entry, pt in bed, awake and agreeable to participate. Pt is drowsy, but actively working on her lunch tray eating grapes. The pt is oriented x3, pleasant, conversational, and generally a fair historian, difficulty with attention to task, train of thought often limited and interrupted by distraction. Heavy effort to EOB, minA to rise and pivot to chair, then min-modA for positioning in the chair. Pt has small volume, watery bowel incontinence during SPT process, author assists with floor cleanup for safety. Functional mobility assessment demonstrates increased effort/time requirements, poor tolerance, and need for physical assistance, whereas the patient performed these at a higher level of independence PTA. Pt will benefit from skilled PT intervention to increase independence and safety with basic mobility in preparation for discharge to the venue listed below.       Follow Up Recommendations Home health PT    Equipment Recommendations  None recommended by PT    Recommendations for Other Services       Precautions / Restrictions Precautions Precautions: Fall Restrictions Weight Bearing Restrictions: No      Mobility  Bed Mobility Overal bed mobility: Needs Assistance Bed Mobility: Supine to Sit     Supine to sit: Supervision     General bed mobility comments: heavy effort required due to edematous BLE and gross  weakness/lethragy.  Transfers Overall transfer level: Needs assistance Equipment used: 1 person hand held assist Transfers: Sit to/from [facility] Sit to Stand: Min assist Stand pivot transfers: Min assist       General transfer comment: steps for pivot to chair are labored, weak appearing, difficulty with weight shifting without LOB.  Ambulation/Gait     Assistive device: (deferred, not appropriate at this time.)          Stairs            Wheelchair Mobility    Modified Rankin (Stroke Patients Only)       Balance Overall balance assessment: Mild deficits observed, not formally tested;Modified Independent                                           Pertinent Vitals/Pain Pain Assessment: Faces Faces Pain Scale: Hurts little more Pain Location: B LE's and feet Pain Descriptors / Indicators: Aching;Heaviness Pain Intervention(s): Limited activity within patient's tolerance;Monitored during session;Repositioned    Home Living Family/patient expects to be discharged to:: Private residence Living Arrangements: Spouse/significant other Available Help at Discharge: Family(Has a [facility] RN in home 3d/week, 1-2p x past 2 weeks.) Type of Home: House Home Access: Stairs to enter Entrance Stairs-Rails: Can reach both Entrance Stairs-Number of Steps: 3 Home Layout: One level Home Equipment: Environmental consultant - 2 wheels Additional Comments: Scientist, physiological    Prior Function Level of Independence: Independent with assistive device(s)               Hand Dominance   Dominant Hand: Right  Extremity/Trunk Assessment   Upper Extremity Assessment Upper Extremity Assessment: Generalized weakness;Overall [facility] for tasks assessed    Lower Extremity Assessment Lower Extremity Assessment: Generalized weakness;Overall [facility] for tasks assessed(significant LEE noted in feet/calves, pt reports originating at belly and edematous from there  downward. Pitting mid calf down. legs heavy.)       Communication      Cognition Arousal/Alertness: Lethargic Behavior During Therapy: Impulsive(drowsy, inattentive) Overall Cognitive Status: Impaired/Different from baseline                                        General Comments      Exercises     Assessment/Plan    PT Assessment Patient needs continued PT services  PT Problem List Decreased strength;Decreased activity tolerance;Decreased mobility;Decreased skin integrity;Decreased balance;Decreased cognition;Decreased safety awareness;Decreased knowledge of precautions       PT Treatment Interventions Gait training;Functional mobility training;Therapeutic activities;Therapeutic exercise;Balance training;Manual techniques;Stair training;Patient/family education    PT Goals (Current goals can be found in the Care Plan section)  Acute Rehab PT Goals Patient Stated Goal: Decrease edema to improve ambulation and get stronger PT Goal Formulation: With patient Time For Goal Achievement: 07/12/19 Potential to Achieve Goals: Good    Frequency Min 2X/week   Barriers to discharge        Co-evaluation               AM-PAC PT "6 Clicks" Mobility  Outcome Measure Help needed turning from your back to your side while in a flat bed without using bedrails?: A Little Help needed moving from lying on your back to sitting on the side of a flat bed without using bedrails?: A Little Help needed moving to and from a bed to a chair (including a wheelchair)?: A Little Help needed standing up from a chair using your arms (e.g., wheelchair or bedside chair)?: A Little Help needed to walk in hospital room?: A Lot Help needed climbing 3-5 steps with a railing? : A Lot 6 Click Score: 16    End of Session   Activity Tolerance: Patient tolerated treatment well;Patient limited by fatigue;Patient limited by pain Patient left: in chair;with call bell/phone within  reach;with chair alarm set Nurse Communication: Mobility status PT Visit Diagnosis: Other abnormalities of gait and mobility (R26.89);Muscle weakness (generalized) (M62.81);Difficulty in walking, not elsewhere classified (R26.2)    Time: 1330-1410 PT Time Calculation (min) (ACUTE ONLY): 40 min   Charges:   PT Evaluation $PT Eval Moderate Complexity: 1 Mod PT Treatments $Therapeutic Exercise: 8-22 mins        3:41 PM, 06/28/19 Rosamaria Lints, PT, DPT Physical Therapist - [facility]  [phone] (ASCOM)    Adaleen Hulgan C 06/28/2019, 3:39 PM

## 2019-06-28 NOTE — Progress Notes (Signed)
Patient ID: Abigail [name], female   DOB: November 22, 1960, 59 y.o.   MRN: [id number] patient complaining of some droopy left eyelid. Left pupil mildly dilated but equally reacting bilaterally. Patient complains of some fuzzy vision in left eye. She wears reading glasses. Has not seen ophthalmology in a while. Will get MRI of the brain. If remains negative patient recommended to follow-up with ophthalmology.

## 2019-06-28 NOTE — Progress Notes (Signed)
D: Pt alert and oriented x 4. Pt denies experiencing any pain at this time. Pt appears less drowsy this morning. Pt up to chair with PT. Noticed difference in pt's eyes responsiveness and size this afternoon, MD placed orders.   A: Scheduled medications administered to pt, per MD orders. Support and encouragement provided. Frequent verbal contact made.    R: No adverse drug reactions noted. Pt complaint with medications and treatment plan. Pt interacts well with staff on the unit. Pt is stable at this time, Will continue to monitor and provide care for as ordered.

## 2019-06-28 NOTE — Progress Notes (Signed)
[address] [place], [place] [address] Phone [phone]. Fax [phone]  Date: 06/28/2019                  Patient Name:  Abigail [name]  MRN: [id number]  DOB: 1960-08-22  Age / Sex: 59 y.o., female         PCP: Tonia Ghent, MD                 Service Requesting Consult: IM/ Fritzi Mandes, MD                 Reason for Consult: Hyponatremia            History of Present Illness: Patient is a 59 y.o. female with medical problems of alcoholic liver disease, who was admitted to [facility] on 06/26/2019 for evaluation of Generalized weakness.  Nephrology consult has been requested for evaluation of hyponatremia.  Patient has known history of alcoholism.  She admits to drinking large amount of liquor almost a bottle a day.  States she quit about a month ago Last normal sodium was on May 12, 2019 at 138.  Creatinine normal 0.49 Admit sodium of 122, creatinine 0.91 which is twice her baseline Albumin is low at 1.6 Patient has generalized edema  Today, patient states she feels about the same Sodium level has improved some She feels like she has more urine output   Current medications: Current Facility-Administered Medications  Medication Dose Route Frequency Provider Last Rate Last Admin  . acetaminophen (TYLENOL) tablet 650 mg  650 mg Oral Q6H PRN Mansy, Jan A, MD       Or  . acetaminophen (TYLENOL) suppository 650 mg  650 mg Rectal Q6H PRN Mansy, Jan A, MD      . albumin human 25 % solution 12.5 g  12.5 g Intravenous TID Murlean Iba, MD 60 mL/hr at 06/28/19 0954 12.5 g at 06/28/19 0954  . albuterol (PROVENTIL) (2.5 MG/3ML) 0.083% nebulizer solution 2.5 mg  2.5 mg Inhalation Q4H PRN Mansy, Jan A, MD      . apixaban (ELIQUIS) tablet 5 mg  5 mg Oral BID Mansy, Jan A, MD   5 mg at 06/28/19 0945  . cyanocobalamin ((VITAMIN B-12)) injection 1,000 mcg  1,000 mcg Intramuscular Q14 Days Mansy, Jan A, MD   1,000 mcg at 06/28/19 0947  . folic acid (FOLVITE) tablet 1 mg   1 mg Oral Daily Mansy, Jan A, MD   1 mg at 06/28/19 0945  . furosemide (LASIX) injection 20 mg  20 mg Intravenous TID Fritzi Mandes, MD   20 mg at 06/28/19 1429  . lactulose (CHRONULAC) 10 GM/15ML solution 30 g  30 g Oral TID Mansy, Jan A, MD   30 g at 06/28/19 0944  . lipase/protease/amylase (CREON) capsule 36,000 Units  36,000 Units Oral TID [facility] Mansy, Arvella Merles, MD   36,000 Units at 06/28/19 1132  . magnesium hydroxide (MILK OF MAGNESIA) suspension 30 mL  30 mL Oral Daily PRN Mansy, Jan A, MD      . midodrine (PROAMATINE) tablet 5 mg  5 mg Oral TID WC Murlean Iba, MD   5 mg at 06/28/19 1131  . multivitamin with minerals tablet 1 tablet  1 tablet Oral Daily Mansy, Jan A, MD   1 tablet at 06/28/19 0945  . ondansetron (ZOFRAN) tablet 4 mg  4 mg Oral Q6H PRN Mansy, Arvella Merles, MD       Or  . ondansetron [facility]) injection 4  mg  4 mg Intravenous Q6H PRN Mansy, Jan A, MD   4 mg at 06/27/19 0421  . pantoprazole (PROTONIX) EC tablet 40 mg  40 mg Oral Daily Mansy, Jan A, MD   40 mg at 06/28/19 0946  . sodium chloride flush (NS) 0.9 % injection 3 mL  3 mL Intravenous Once Miguel Aschoff., MD      . thiamine tablet 100 mg  100 mg Oral Daily Mansy, Jan A, MD   100 mg at 06/28/19 0948  . traMADol (ULTRAM) tablet 50 mg  50 mg Oral Q8H PRN Mansy, Jan A, MD      . venlafaxine [facility]) tablet 75 mg  75 mg Oral BID Mansy, Jan A, MD   75 mg at 06/28/19 0946  . Vitamin D (Ergocalciferol) (DRISDOL) capsule 50,000 Units  50,000 Units Oral Q7 days Mansy, Jan A, MD   50,000 Units at 06/28/19 0946     Vital Signs: Blood pressure 109/78, pulse 97, temperature (!) 97.5 F (36.4 C), temperature source Oral, resp. rate 16, height 5' (1.524 m), weight 68 kg, SpO2 96 %.  No intake or output data in the 24 hours ending 06/28/19 1457  Weight trends: Filed Weights   06/26/19 1453 06/27/19 0754  Weight: 68 kg 68 kg    Physical Exam: General:  Laying in the bed, no acute distress  HEENT  anicteric, moist oral mucous membrane   Lungs:  Normal breathing effort on room air, clear to auscultation  Heart::  Tachycardic, regular  Abdomen:  Soft, mildly distended  Extremities:  2-3+ generalized pitting edema, upto lower abdomen  Neurologic:  Sleepy but able to answer questions  Skin:  No acute rashes       Lab results: Basic Metabolic Panel: Recent Labs  Lab 06/26/19 1546 06/27/19 0500 06/28/19 0503  NA 122* 125* 128*  K 5.5* 5.0 4.4  CL 93* 97* 100  CO2 23 20* 23  GLUCOSE 96 84 88  BUN 22* 19 23*  CREATININE 0.91 0.73 0.76  CALCIUM 7.8* 7.5* 8.0*    Liver Function Tests: Recent Labs  Lab 06/28/19 0503  AST 55*  ALT 29  ALKPHOS 100  BILITOT 3.2*  PROT 5.8*  ALBUMIN 1.6*   No results for input(s): LIPASE, AMYLASE in the last 168 hours. Recent Labs  Lab 06/28/19 0503  AMMONIA 36*    CBC: Recent Labs  Lab 06/27/19 0500 06/28/19 0503  WBC 9.9 9.1  HGB 10.4* 9.0*  HCT 30.4* 26.6*  MCV 99.3 99.3  PLT 252 267    Cardiac Enzymes: No results for input(s): CKTOTAL, TROPONINI in the last 168 hours.  BNP: Invalid input(s): POCBNP  CBG: Recent Labs  Lab 06/26/19 1500  GLUCAP 81    Microbiology: Recent Results (from the past 720 hour(s))  SARS CORONAVIRUS 2 (TAT 6-24 HRS) Nasopharyngeal Nasopharyngeal Swab     Status: None   Collection Time: 06/26/19  8:19 PM   Specimen: Nasopharyngeal Swab  Result Value Ref Range Status   SARS Coronavirus 2 NEGATIVE NEGATIVE Final    Comment: (NOTE) SARS-CoV-2 target nucleic acids are NOT DETECTED. The SARS-CoV-2 RNA is generally detectable in upper and lower respiratory specimens during the acute phase of infection. Negative results do not preclude SARS-CoV-2 infection, do not rule out co-infections with other pathogens, and should not be used as the sole basis for treatment or other patient management decisions. Negative results must be combined with clinical observations, patient history, and epidemiological information. The  expected  result is Negative. Fact Sheet for Patients: HairSlick.no Fact Sheet for Healthcare Providers: quierodirigir.com This test is not yet approved or cleared by the [place] FDA and  has been authorized for detection and/or diagnosis of SARS-CoV-2 by FDA under an Emergency Use Authorization (EUA). This EUA will remain  in effect (meaning this test can be used) for the duration of the COVID-19 declaration under Section 56 4(b)(1) of the Act, 21 U.S.C. section 360bbb-3(b)(1), unless the authorization is terminated or revoked sooner. Performed at [facility], [address], [place], [place] [address]      Coagulation Studies: Recent Labs    06/26/19 1546  LABPROT 19.2*  INR 1.6*    Urinalysis: Recent Labs    06/27/19 0500  COLORURINE AMBER*  LABSPEC 1.013  PHURINE 6.0  GLUCOSEU NEGATIVE  HGBUR LARGE*  BILIRUBINUR NEGATIVE  KETONESUR 5*  PROTEINUR 30*  NITRITE NEGATIVE  LEUKOCYTESUR NEGATIVE        Imaging: CT HEAD WO CONTRAST  Result Date: 06/26/2019 CLINICAL DATA:  Focal neuro deficit, greater than 6 hours, stroke suspected. Additional history provided: Weakness and dizziness. EXAM: CT HEAD WITHOUT CONTRAST TECHNIQUE: Contiguous axial images were obtained from the base of the skull through the vertex without intravenous contrast. COMPARISON:  Head CT 07/27/2018 FINDINGS: Brain: There is no evidence of acute intracranial hemorrhage, intracranial mass, midline shift or extra-axial fluid collection.No demarcated cortical infarction. Vascular: No hyperdense vessel. Skull: Normal. Negative for fracture or focal lesion. Sinuses/Orbits: Visualized orbits demonstrate no acute abnormality. No significant paranasal sinus disease or mastoid effusion at the imaged levels. IMPRESSION: No evidence of acute intracranial abnormality. Electronically Signed   By: Jackey Loge DO   On: 06/26/2019 15:34      Assessment & Plan: Pt is a 59 y.o.  Caucasian female with With history of alcoholism, alcoholic liver disease, hypertension, GERD, pancreatitis, portal vein thrombosis, history of factor V Leiden per patient, previous history of COVID-19, h/o gastric bypass was admitted on 06/26/2019 with Hepatic encephalopathy (HCC) [K72.90] Confusion [R41.0] Hyponatremia [E87.1]  Patient has volume overload and generalized anasarca likely from underlying liver cirrhosis and hypoalbuminemia.  Right upper quadrant imaging by ultrasound from June 08, 2019 shows fatty liver with findings of early cirrhosis.  Patent main portal vein with hepatopetal flow.  1.  Cirrhosis of the liver with ascites and generalized anasarca, hypoalbuminemia 2.  Hyponatremia Sodium level has improved to 128 today 3.  Acute kidney injury Baseline of 0.49 (Feb 5).  Peaked at 0.91.  Stabilizing at 0.7 4.  Hematuria noted on urinalysis June 27, 2019  Plan: Discontinue IV normal saline IV albumin supplementation 12.5 g 3 times a day continue midodrine for blood pressure support.  Avoid hypotension Agree with IV furosemide low dose Screening ANA but hematuria likely secondary to anticoagulation and coagulopathy related to liver disease    LOS: 2 Lakyla Biswas Thedore Mins 3/24/20212:57 PM    Note: This note was prepared with Dragon dictation. Any transcription errors are unintentional

## 2019-06-28 NOTE — Consult Note (Signed)
Abigail [name] , MD [name], [place], [place], [address] [address], [place], [place], [address] Phone: [phone]  Fax: [phone]  Consultation  Referring Provider:  Dr. Sherryll [name]     Primary Care Physician:  Abigail Nam, MD Primary Gastroenterologist:  Dr. Russella [name]      Reason for Consultation:     Liver and pancreas issues  Date of Admission:  06/26/2019 Date of Consultation:  06/28/2019         HPI:   Abigail [name] is a 59 y.o. female admitted on 06/26/2019 with weakness and altered mental status.  She has a history of known alcohol abuse, alcoholic liver disease, portal vein thrombosis on Eliquis and history of Covid.  Came to the hospital with confusion.  She is a Armed forces operational officer.  History of headache.  INR is 1.6.  CT head revealed no abnormalities.  History of gastric bypass in 2005.  Chronic pancreatitis history.   Prior imaging 1.  07/27/2018 CT scan of the abdomen and pelvis demonstrates possible pseudocyst formation.  Hepatic steatosis. 2.  03/06/2019: CT scan of the abdomen and pelvis shows features of groundglass opacities in her lungs.  Enteritis.  But no pseudocyst appears to have resolved. 06/08/2019: Right upper quadrant ultrasound: Shows fatty liver with findings suggestive of early cirrhosis.  Patent portal vein and small ascites.  Labs 1.  CBC hemoglobin of 9 with an MCV of 99.3, albumin of 1.6, AST ALT 55 ALT of 29.  Creatinine of 0.76.  Albumin has been low for over a year.  Prior urine analysis over the past 1 year shows no significant proteinuria.  Large amount of blood in the urine.  She is a poor historian bit drowsy but arousable.  Denies any pain.  Denies any weight loss.  Denies any active alcohol intake.  No actual complaint from the patient at this point of time.  Past Medical History:  Diagnosis Date  . Alcoholism (HCC)   . Cholecystitis   . COVID-19 virus infection 03/2019  . GERD (gastroesophageal reflux disease)   .  Headache    improved with gabapentin   . Hypertension   . Pancreatic insufficiency   . Pancreatitis 10/2015   2013 alcohol and/or gallstone  . Photosensitivity    (No formal dx of lupus) prev eval Dr. Gavin Potters in [place]  . Portal vein thrombosis 2019    Past Surgical History:  Procedure Laterality Date  . APPENDECTOMY     As a child  . CAT scan  11/00   wnl  . CHOLECYSTECTOMY  04/20/2012   Procedure: LAPAROSCOPIC CHOLECYSTECTOMY WITH INTRAOPERATIVE CHOLANGIOGRAM;  Surgeon: Robyne Askew, MD;  Location: [facility] OR;  Service: General;  Laterality: N/A;  . COLONOSCOPY  2005  . DOBUTAMINE STRESS ECHO  11/00   wnl  . DOPPLER ECHOCARDIOGRAPHY     Secondary to Redux, wnl  . ESOPHAGOGASTRODUODENOSCOPY  8/01   Esophagitis Abigail [name])  . FOOT SURGERY  04/2015   Pin in small toe  . 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 NSVD     X 2  . SBFT  08/01   wnl  . TONSILLECTOMY     As a child  . TUBAL LIGATION  Late 20's    Prior to Admission medications   Medication Sig Start Date End Date Taking? Authorizing Provider  albuterol (PROVENTIL HFA;VENTOLIN HFA) 108 (90 Base) MCG/ACT inhaler Inhale 2 puffs into the lungs every 4 (four) hours as needed  for wheezing or shortness of breath. 06/20/18  Yes Doreene Nest, NP  apixaban (ELIQUIS) 5 MG TABS tablet Take 2 tablets (10 mg total) by mouth 2 (two) times daily for 6 days. Patient taking differently: Take 5 mg by mouth 2 (two) times daily.  06/11/19 06/26/19 Yes Burnadette Pop, MD  apixaban (ELIQUIS) 5 MG TABS tablet Take 1 tablet (5 mg total) by mouth 2 (two) times daily. 06/17/19  Yes Burnadette Pop, MD  cyanocobalamin (,VITAMIN B-12,) 1000 MCG/ML injection INJECT INTRAMUSCULARLY EVERY 14 DAYS Patient taking differently: Inject 1,000 mcg into the muscle every 14 (fourteen) days.  05/15/19  Yes Abigail Nam, MD  folic acid (FOLVITE) 1 MG tablet Take 1 tablet (1 mg total) by mouth daily. 06/11/19  Yes Burnadette Pop, MD    furosemide (LASIX) 40 MG tablet Take 1 tablet (40 mg total) by mouth daily. 06/11/19 06/10/20 Yes Adhikari, Willia Craze, MD  gabapentin (NEURONTIN) 300 MG capsule TAKE TWO CAPSULES BY MOUTH TWICE A DAY FOR HEADACHE Patient taking differently: Take 600 mg by mouth 2 (two) times daily.  04/05/19  Yes Abigail Nam, MD  lidocaine (XYLOCAINE) 2 % solution Use 5 ml up to 4 times a day as needed. 07/08/18  Yes Abigail Nam, MD  lipase/protease/amylase (CREON) 36000 UNITS CPEP capsule Take 1 capsule (36,000 Units total) by mouth 3 (three) times daily before meals. 05/22/19  Yes Abigail Nam, MD  ondansetron (ZOFRAN ODT) 4 MG disintegrating tablet Take 1 tablet (4 mg total) by mouth every 8 (eight) hours as needed for nausea or vomiting. 05/12/19  Yes Abigail Nam, MD  pantoprazole (PROTONIX) 40 MG tablet Take 1 tablet (40 mg total) by mouth daily. 06/11/19  Yes Burnadette Pop, MD  promethazine (PHENERGAN) 12.5 MG tablet TAKE ONE TABLET BY MOUTH EVERY 6 HOURS AS NEEDED FOR NAUSEA AND VOMITING Patient taking differently: Take 12.5 mg by mouth every 6 (six) hours as needed for nausea or vomiting.  03/10/19  Yes Abigail Nam, MD  thiamine 100 MG tablet Take 1 tablet (100 mg total) by mouth daily. 06/11/19  Yes Burnadette Pop, MD  traMADol (ULTRAM) 50 MG tablet TAKE ONE TABLET BY MOUTH EVERY 8 HOUR AS NEEDED FOR PAIN Patient taking differently: Take 50 mg by mouth every 8 (eight) hours as needed for moderate pain.  03/24/19  Yes Abigail Nam, MD  venlafaxine (EFFEXOR) 75 MG tablet TAKE ONE TABLET BY MOUTH TWICE A DAY Patient taking differently: Take 75 mg by mouth 2 (two) times daily.  03/27/19  Yes Abigail Nam, MD  Vitamin D, Ergocalciferol, (DRISDOL) 1.25 MG (50000 UNIT) CAPS capsule Take 1 capsule (50,000 Units total) by mouth every 7 (seven) days. 06/11/19  Yes Burnadette Pop, MD  Insulin Syringe-Needle U-100 (B-D INSULIN SYRINGE 1CC/25GX1") 25G X 1" 1 ML MISC Inject 1 Syringe into the muscle every  14 (fourteen) days. For B12 injection 11/16/18   Abigail Nam, MD    Family History  Problem Relation Age of Onset  . Heart disease Mother        CAD, angioplasty twice with stents  . Cancer Mother 28       Breast  . Breast cancer Mother 62  . Hypertension Mother   . Peripheral Artery Disease Mother   . Hypertension Father   . Hypothyroidism Father   . Heart disease Father        MI at age 2  . Cancer Father 57  H/O renal CA  . Diabetes Sister   . Cancer Sister 34       Renal CA  . Factor V Leiden deficiency Brother   . Clotting disorder Brother   . Colon cancer Neg Hx      Social History   Tobacco Use  . Smoking status: Never Smoker  . Smokeless tobacco: Never Used  Substance Use Topics  . Alcohol use: Yes    Alcohol/week: 3.0 standard drinks    Types: 2 Glasses of wine, 1 Cans of beer per week    Comment: cvouple glsses of wine over weekdend and beer monday   . Drug use: No    Allergies as of 06/26/2019 - Review Complete 06/26/2019  Allergen Reaction Noted  . Latex Rash 04/20/2012  . Atorvastatin Other (See Comments) 04/16/2015  . Dilaudid [hydromorphone hcl] Itching and Other (See Comments) 03/26/2015  . Lexapro [escitalopram oxalate] Other (See Comments) 03/19/2016  . Lisinopril Other (See Comments) 05/15/2011  . Mirtazapine Swelling 06/01/2006  . Nsaids  04/08/2012  . Tylenol [acetaminophen] Other (See Comments) 03/19/2016    Review of Systems:    All systems reviewed and negative except where noted in HPI.   Physical Exam:  Vital signs in last 24 hours: Temp:  [97.5 F (36.4 C)-98 F (36.7 C)] 97.5 F (36.4 C) (03/24 0720) Pulse Rate:  [93-107] 96 (03/24 0720) Resp:  [16-17] 16 (03/23 2256) BP: (106-132)/(73-85) 106/74 (03/24 0720) SpO2:  [95 %-100 %] 96 % (03/24 0720) Last BM Date: 06/28/19 General:   Pleasant, cooperative in NAD Head:  Normocephalic and atraumatic. Eyes:   No icterus.   Conjunctiva pink. PERRLA. Ears:  Normal  auditory acuity. Neck:  Supple; no masses or thyroidomegaly Lungs: Respirations even and unlabored. Lungs clear to auscultation bilaterally.   No wheezes, crackles, or rhonchi.  Heart:  Regular rate and rhythm;  Without murmur, clicks, rubs or gallops Abdomen:  Soft, nondistended, nontender. Normal bowel sounds. No appreciable masses or hepatomegaly.  No rebound or guarding.  Neurologic:  Alert and oriented x3; drowsy but arousable. Psych:  Alert and cooperative. Normal affect.  LAB RESULTS: Recent Labs    06/26/19 1546 06/27/19 0500 06/28/19 0503  WBC 9.6 9.9 9.1  HGB 10.6* 10.4* 9.0*  HCT 30.7* 30.4* 26.6*  PLT 257 252 267   BMET Recent Labs    06/26/19 1546 06/27/19 0500 06/28/19 0503  NA 122* 125* 128*  K 5.5* 5.0 4.4  CL 93* 97* 100  CO2 23 20* 23  GLUCOSE 96 84 88  BUN 22* 19 23*  CREATININE 0.91 0.73 0.76  CALCIUM 7.8* 7.5* 8.0*   LFT Recent Labs    06/26/19 1841 06/27/19 0500 06/28/19 0503  PROT 6.5   < > 5.8*  ALBUMIN 1.6*   < > 1.6*  AST 58*   < > 55*  ALT 34   < > 29  ALKPHOS 123   < > 100  BILITOT 4.8*   < > 3.2*  BILIDIR 1.4*  --   --   IBILI 3.4*  --   --    < > = values in this interval not displayed.   PT/INR Recent Labs    06/26/19 1546  LABPROT 19.2*  INR 1.6*    STUDIES: CT HEAD WO CONTRAST  Result Date: 06/26/2019 CLINICAL DATA:  Focal neuro deficit, greater than 6 hours, stroke suspected. Additional history provided: Weakness and dizziness. EXAM: CT HEAD WITHOUT CONTRAST TECHNIQUE: Contiguous axial images were obtained from the  base of the skull through the vertex without intravenous contrast. COMPARISON:  Head CT 07/27/2018 FINDINGS: Brain: There is no evidence of acute intracranial hemorrhage, intracranial mass, midline shift or extra-axial fluid collection.No demarcated cortical infarction. Vascular: No hyperdense vessel. Skull: Normal. Negative for fracture or focal lesion. Sinuses/Orbits: Visualized orbits demonstrate no acute  abnormality. No significant paranasal sinus disease or mastoid effusion at the imaged levels. IMPRESSION: No evidence of acute intracranial abnormality. Electronically Signed   By: Jackey Loge DO   On: 06/26/2019 15:34      Impression / Plan:   AKITA MAXIM is a 59 y.o. y/o female with a history of excess alcohol consumption presents to hospital with altered mental status.  History of chronic pancreatitis/pseudocyst of the pancreas.  Resolved on last imaging.  She has a very low albumin and a history of gastric bypass.  History of portal vein thrombosis on anticoagulation.  Hematuria noted in urine recently but no proteinuria.  Plan 1.  Very low albumin.  May be a combination of liver disease and probably poor nutrition.  Suggest dietitian input to improve oral nutrition.  Complete abstinence of alcohol.  History of gastric bypass. 2.  Suggest to check micronutrients such as vitamin A,D,E,K, vitamin C. 3.  No further liver work-up required at this point of time.  As an  outpatient she would require further autoimmune and viral hepatitis work-up.  In terms of her pancreatitis she does not have any at this point of time she has had a pancreatic cyst in the past which has resolved in the last imaging.  Would suggest to abstain from all alcohol use. 4.  Suggest to ensure she has 2 soft bowel movements per day.  If not she may develop hepatic encephalopathy in that case would suggest to titrate lactulose to ensure she has 2 soft bowel movements per day and consider adding Xifaxan at that point of time.  Thank you for involving me in the care of this patient.      LOS: 2 days   Abigail Mood, MD  06/28/2019, 10:22 AM

## 2019-06-28 NOTE — Progress Notes (Signed)
[facility]  - [place] [facility]   PATIENT NAME: Abigail [name]    MR#:  [id number]  DATE OF BIRTH:  [birth year]  SUBJECTIVE:  patient appears sleepy however opens her eyes answered all my questions appropriately. currently using bed pan. Denies any tremors. Ate to waffles and drank some ensure. No family in the room REVIEW OF SYSTEMS:   Review of Systems  Constitutional: Negative for chills, fever and weight loss.  HENT: Negative for ear discharge, ear pain and nosebleeds.   Eyes: Negative for blurred vision, pain and discharge.  Respiratory: Negative for sputum production, shortness of breath, wheezing and stridor.   Cardiovascular: Negative for chest pain, palpitations, orthopnea and PND.  Gastrointestinal: Negative for abdominal pain, diarrhea, nausea and vomiting.  Genitourinary: Negative for frequency and urgency.  Musculoskeletal: Negative for back pain and joint pain.  Neurological: Positive for weakness. Negative for sensory change, speech change and focal weakness.  Psychiatric/Behavioral: Negative for depression and hallucinations. The patient is not nervous/anxious.    Tolerating Diet:yes Tolerating PT: pending  DRUG ALLERGIES:   Allergies  Allergen Reactions  . Latex Rash    "hands turn beat red and start to itch"  . Atorvastatin Other (See Comments)    Swollen joints  . Dilaudid [Hydromorphone Hcl] Itching and Other (See Comments)    Itching- no rash- likely side effect but not true allergy.  Tolerates oxycodone.   [name] [Escitalopram Oxalate] Other (See Comments)    Psychological changes  . Lisinopril Other (See Comments)    Hives, presumed allergy  . Mirtazapine Swelling    swelling  . Nsaids     Gastritis 2013  . Tylenol [Acetaminophen] Other (See Comments)    Hx pancreatitis    VITALS:  Blood pressure 106/74, pulse 96, temperature (!) 97.5 F (36.4 C), temperature source Oral, resp. rate 16, height 5' (1.524 m), weight 68 kg,  SpO2 96 %.  PHYSICAL EXAMINATION:   Physical Exam  GENERAL:  59 y.o.-year-old patient lying in the bed with no acute distress.  Appears chronically ill EYES: Pupils equal, round, reactive to light and accommodation.mild scleral icterus.   HEENT: Head atraumatic, normocephalic. Oropharynx and nasopharynx clear.  NECK:  Supple, no jugular venous distention. No thyroid enlargement, no tenderness.  LUNGS: Normal breath sounds bilaterally, no wheezing, rales, rhonchi. No use of accessory muscles of respiration.  CARDIOVASCULAR: S1, S2 normal. No murmurs, rubs, or gallops.  ABDOMEN: Soft, nontender, nondistended. Bowel sounds present. No organomegaly or mass.  EXTREMITIES: chronic pitting edema bilateral lower extremity NEUROLOGIC: Cranial nerves II through XII are intact. No focal Motor or sensory deficits b/l. No tremors PSYCHIATRIC:  patient is alert and oriented x 3-- answered all questions appropriately SKIN: No obvious rash, lesion, or ulcer.   LABORATORY PANEL:  CBC Recent Labs  Lab 06/28/19 0503  WBC 9.1  HGB 9.0*  HCT 26.6*  PLT 267    Chemistries  Recent Labs  Lab 06/28/19 0503  NA 128*  K 4.4  CL 100  CO2 23  GLUCOSE 88  BUN 23*  CREATININE 0.76  CALCIUM 8.0*  AST 55*  ALT 29  ALKPHOS 100  BILITOT 3.2*   Cardiac Enzymes No results for input(s): TROPONINI in the last 168 hours. RADIOLOGY:  CT HEAD WO CONTRAST  Result Date: 06/26/2019 CLINICAL DATA:  Focal neuro deficit, greater than 6 hours, stroke suspected. Additional history provided: Weakness and dizziness. EXAM: CT HEAD WITHOUT CONTRAST TECHNIQUE: Contiguous axial images were obtained from the base  of the skull through the vertex without intravenous contrast. COMPARISON:  Head CT 07/27/2018 FINDINGS: Brain: There is no evidence of acute intracranial hemorrhage, intracranial mass, midline shift or extra-axial fluid collection.No demarcated cortical infarction. Vascular: No hyperdense vessel. Skull: Normal.  Negative for fracture or focal lesion. Sinuses/Orbits: Visualized orbits demonstrate no acute abnormality. No significant paranasal sinus disease or mastoid effusion at the imaged levels. IMPRESSION: No evidence of acute intracranial abnormality. Electronically Signed   By: Jackey Loge DO   On: 06/26/2019 15:34   ASSESSMENT AND PLAN:  59 y.o.femalewith a known history of alcoholism, alcoholic liver disease, gastric bypass surgery 2005 at [facility], hypertension, portal vein thrombosis 2019, GERD, chronic pancreatitis, portal vein thrombosis on Eliquis and previous history of COVID-19 in March 08, 2019, presented to emergency room with acute onset of altered mental status with confusion and generalized weakness which have been noticed by her home health nurse.   Please note patient was recently admitted at [facility] from 3/4-3/7 for similar presentation.  Acute metabolic encephalopathy likely multifactorial secondary to hepatic encephalopathy as well as hyponatremia. -Continue lactulose and will follow daily ammonia level. -consult GI - Dr. Tobi Bastos appreciated. Recommends outpatient follow-up after current hospitalization. Further workup for liver disease at present -serum sodium improving. -Discontinue Phenergan, trazodone. -Hold gabapentin  Generalized weakness -Multifactorial:Liver cirrhosis, chronic pancreatitis, poor appetite, low electrolytes, recent COVID-19 infection  History of portal venous thrombosis in 2019 Factor V Leyden disease -She has seen hematologist at [facility]-- Dr. Donneta Romberg -Ultrasound duplex of lower extremities on 3/5 showed bilateral DVT in theRight popliteal DVT and left common femoral and left femoral vein DVT.  -on Eliquis   Alcoholic liver disease/generalized anasarca -History of chronic alcoholism. Extensive fatty liver and CT scan from November 2020. Ultrasound abdomen with findings of early liver cirrhosis. -Liver enzymes are slightly abnormal. INR  elevated at 1.6. Albumin significantly low at 1.4 -Clinically has 3+ bilateral pedal edema. No evidence of SBP. -Started on Lasix 20 mg IV TID along with albumin considering generalized anasarca - nephrology consultation with Dr. Thedore Mins. - She had gastric bypass surgery done in 2005 at [facility] and has been taking nutritional supplements. She used to be a heavy drinker until a year ago and has chronic pancreatitis,gets intermittent nausea, vomiting and takes pancreatic enzyme supplements.  -dietitian consulted for nutritional supplement and education  Hyponatremia/hyperkalemia -Suspect hypervolemic hyponatremia.  -On admission, sodium was 122->125 -started on Lasix 20 mg TID -patient getting IV albumin infusions TID  Moderate Protein calorie malnutrition -Albumin level low 1.4. Her baseline albumin level already seems low between 2-2.5. Likely related to liver disease and poor intake. -Nutrition consulted.  History of gastric bypass surgery 2005 -Probably followed by nutritional deficiencies. 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 pancreatic enzymes   Recent COVID-19 infection -Did not need hospitalization at that time. She took outpatient monoclonal antibody infusion.  Type 2 diabetes mellitus. -Sliding scale insulin  Peripheral neuropathy. -Holding her Neurontin given patient appearing bit sleepy intermittently although answers most questions appropriately.  GERD. - continue Protonix.  History of portal vein thrombosis and bilateral DVT - continue her Eliquis.  Apparently, She is to report to [place] in [place] on Friday at 6 PM.  Unsure of all the details  generalized deconditioning with weakness. Patient needs to work with physical therapy.  DVT prophylaxis: Eliquis Family Communication: discussed with BF Mr farley Disposition Plan:  Home environment Barriers to DC -clinical condition improvement with leg edema and mentation.   TOTAL TIME TAKING CARE OF THIS PATIENT: 30 minutes.  >50% time spent on counselling and coordination of care  Note: This dictation was prepared with Dragon dictation along with smaller phrase technology. Any transcriptional errors that result from this process are unintentional.  Fritzi Mandes M.D    [facility]   CC: Primary care physician; Tonia Ghent, MDPatient ID: Abigail [name], female   DOB: [birth year], 59 y.o.   MRN: [id number]

## 2019-06-29 ENCOUNTER — Telehealth: Payer: Self-pay

## 2019-06-29 DIAGNOSIS — E43 Unspecified severe protein-calorie malnutrition: Secondary | ICD-10-CM

## 2019-06-29 LAB — C3 COMPLEMENT: C3 Complement: 49 mg/dL — ABNORMAL LOW (ref 82–167)

## 2019-06-29 LAB — AMMONIA: Ammonia: 40 umol/L — ABNORMAL HIGH (ref 9–35)

## 2019-06-29 LAB — BASIC METABOLIC PANEL
Anion gap: 7 (ref 5–15)
BUN: 21 mg/dL — ABNORMAL HIGH (ref 6–20)
CO2: 22 mmol/L (ref 22–32)
Calcium: 7.9 mg/dL — ABNORMAL LOW (ref 8.9–10.3)
Chloride: 101 mmol/L (ref 98–111)
Creatinine, Ser: 0.84 mg/dL (ref 0.44–1.00)
GFR calc Af Amer: >60 mL/min (ref >60)
GFR calc non Af Amer: >60 mL/min (ref >60)
Glucose, Bld: 102 mg/dL — ABNORMAL HIGH (ref 70–99)
Potassium: 3.5 mmol/L (ref 3.5–5.1)
Sodium: 130 mmol/L — ABNORMAL LOW (ref 135–145)

## 2019-06-29 LAB — ANCA TITERS
Atypical P-ANCA titer: <1:20 {titer}
C-ANCA: <1:20 {titer}
P-ANCA: <1:20 {titer}

## 2019-06-29 LAB — C4 COMPLEMENT: Complement C4, Body Fluid: 13 mg/dL (ref 12–38)

## 2019-06-29 MED ORDER — CALCIUM CARBONATE-VITAMIN D 500-200 MG-UNIT PO TABS: 1.0000 | ORAL_TABLET | Freq: Two times a day (BID) | ORAL | 1 refills | Status: DC

## 2019-06-29 MED ORDER — LACTULOSE 10 GM/15ML PO SOLN: 30.0000 g | Freq: Three times a day (TID) | ORAL | 0 refills | Status: DC

## 2019-06-29 MED ORDER — FUROSEMIDE 20 MG PO TABS: 20.0000 mg | ORAL_TABLET | Freq: Two times a day (BID) | ORAL | Status: DC

## 2019-06-29 MED ORDER — ENSURE ENLIVE PO LIQD: 237.0000 mL | Freq: Three times a day (TID) | ORAL | 12 refills | Status: DC

## 2019-06-29 MED ORDER — MIDODRINE HCL 5 MG PO TABS: 5.0000 mg | ORAL_TABLET | Freq: Three times a day (TID) | ORAL | 1 refills | Status: DC

## 2019-06-29 MED ORDER — SODIUM CHLORIDE 0.9 % IV SOLN
INTRAVENOUS | Status: DC | PRN
  Administered 2019-06-29: 250 mL via INTRAVENOUS

## 2019-06-29 MED ORDER — ADULT MULTIVITAMIN W/MINERALS CH: 1.0000 | ORAL_TABLET | Freq: Every day | ORAL | 1 refills | Status: DC

## 2019-06-29 MED ORDER — CALCIUM CARBONATE-VITAMIN D 500-200 MG-UNIT PO TABS
1.0000 | ORAL_TABLET | Freq: Two times a day (BID) | ORAL | Status: DC
  Administered 2019-06-29: 1 via ORAL
  Filled 2019-06-29: qty 1

## 2019-06-29 NOTE — Discharge Instructions (Signed)
Abstain from drinking alcohol °

## 2019-06-29 NOTE — Telephone Encounter (Signed)
Transition Care Management Follow-up Telephone Call  Date of discharge and from where: 06/29/2019, [facility]  How have you been since you were released from the hospital? Patient is doing better. No complaints at this time.   Any questions or concerns? No   Items Reviewed:  Did the pt receive and understand the discharge instructions provided? Yes   Medications obtained and verified? Yes   Any new allergies since your discharge? No   Dietary orders reviewed? Yes  Do you have support at home? Yes   Functional Questionnaire: (I = Independent and D = Dependent) ADLs: I  Bathing/Dressing- I  Meal Prep- I  Eating- I  Maintaining continence- I  Transferring/Ambulation- I  Managing Meds- I  Follow up appointments reviewed:   PCP Hospital f/u appt confirmed? Yes  Scheduled to see Dr. Para March on 07/10/2019 @ 10:30 am.  Specialist Hospital f/u appt confirmed? Yes  Scheduled to see gastroenterology  Are transportation arrangements needed? No   If their condition worsens, is the pt aware to call PCP or go to the Emergency Dept.? Yes  Was the patient provided with contact information for the PCP's office or ED? Yes  Was to pt encouraged to call back with questions or concerns? Yes

## 2019-06-29 NOTE — TOC Transition Note (Signed)
Transition of Care [facility]) - CM/SW Discharge Note   Patient Details  Name: Abigail [name] MRN: [id number] Date of Birth: [birth year]  Transition of Care [facility]) CM/SW Contact:  Barrie Dunker, RN Phone Number: 06/29/2019, 12:07 PM   Clinical Narrative:    Patient to Discharge home with [facility] to see for [facility] PT, she has a RW at home, Her Boyfriend will provide transportation, she stated she has no other needs   Final next level of care: Home w Home Health Services Barriers to Discharge: Barriers Resolved   Patient Goals and CMS Choice        Discharge Placement                       Discharge Plan and Services                                     Social Determinants of Health (SDOH) Interventions     Readmission Risk Interventions No flowsheet data found.

## 2019-06-29 NOTE — Progress Notes (Signed)
Physical Therapy Treatment Patient Details Name: Abigail [name] MRN: [id number] DOB: [birth year] Today's Date: 06/29/2019    History of Present Illness Abigail [name] is a 71yoF who comes to [facility] on 3/22 c AMS, confusion, weakness. Pt admitted with hepatic encphalopathy. PMH: alcoholism, alcoholic liver disease, hypertension, GERD, pancreatitis, portal vein thrombosis on Eliquis and previous history of COVID-19.    PT Comments    Pt in bed upon entry, somnolent, holding a coffee cup near shoulder while appearing to fall asleep. Pt agreeable to participate. Pt continues to have obvious AMS, flat affect, slow processing, delayed response time, intermittent word choice error. BLE remain edematous, minimally improved since yesterday, pt requires physical assist to move in bed d/t weakness. At EOB, pt able to rise with RW to standing minGuard assist, then mobilize 3 ft over to recliner, a slow drawn out transition with episodic freezing and difficulty moving LLEin swing phase. Pt adjusted in recliner, set up with remaining meal tray. Pt remains limited and weak, altered. Will continue to follow. Author concerned that support at home is insufficient at present, as patient is essentially nonambulatory at this time and cognitively unable to perform ADL with independence, would be home alone for several hours daily.      Follow Up Recommendations  Home health PT     Equipment Recommendations  None recommended by PT    Recommendations for Other Services       Precautions / Restrictions Precautions Precautions: Fall Restrictions Weight Bearing Restrictions: No    Mobility  Bed Mobility Overal bed mobility: Needs Assistance Bed Mobility: Supine to Sit     Supine to sit: Mod assist     General bed mobility comments: heavy effort required due to edematous BLE and gross weakness/lethragy.  Transfers Overall transfer level: Needs assistance Equipment used: Rolling walker (2  wheeled) Transfers: Sit to/from Stand Sit to Stand: Min assist Stand pivot transfers: Min guard       General transfer comment: very slow, labored trnasitiion to chair, severla episodes fo freezing. Pt voices difficulty moving LLE  Ambulation/Gait Ambulation/Gait assistance: (pt unable, unsafe to attempt)               Stairs             Wheelchair Mobility    Modified Rankin (Stroke Patients Only)       Balance Overall balance assessment: Mild deficits observed, not formally tested;Modified Independent                                          Cognition Arousal/Alertness: Lethargic Behavior During Therapy: WFL for tasks assessed/performed Overall Cognitive Status: Impaired/Different from baseline                                        Exercises      General Comments        Pertinent Vitals/Pain Pain Assessment: Faces Faces Pain Scale: Hurts even more Pain Location: Left leg with palpation or mobilitiy Pain Descriptors / Indicators: Aching;Heaviness Pain Intervention(s): Limited activity within patient's tolerance;Monitored during session;Repositioned    Home Living                      Prior Function  PT Goals (current goals can now be found in the care plan section) Acute Rehab PT Goals Patient Stated Goal: Decrease edema to improve ambulation and get stronger PT Goal Formulation: With patient Time For Goal Achievement: 07/12/19 Potential to Achieve Goals: Good Progress towards PT goals: Progressing toward goals    Frequency    Min 2X/week      PT Plan      Co-evaluation              AM-PAC PT "6 Clicks" Mobility   Outcome Measure  Help needed turning from your back to your side while in a flat bed without using bedrails?: A Little Help needed moving from lying on your back to sitting on the side of a flat bed without using bedrails?: A Lot Help needed moving to and  from a bed to a chair (including a wheelchair)?: A Little Help needed standing up from a chair using your arms (e.g., wheelchair or bedside chair)?: A Little Help needed to walk in hospital room?: A Lot Help needed climbing 3-5 steps with a railing? : A Lot 6 Click Score: 15    End of Session Equipment Utilized During Treatment: Gait belt Activity Tolerance: Patient tolerated treatment well;Patient limited by fatigue;Patient limited by pain Patient left: in chair;with call bell/phone within reach;with chair alarm set Nurse Communication: Mobility status PT Visit Diagnosis: Other abnormalities of gait and mobility (R26.89);Muscle weakness (generalized) (M62.81);Difficulty in walking, not elsewhere classified (R26.2)     Time: 2993-7169 PT Time Calculation (min) (ACUTE ONLY): 11 min  Charges:  $Therapeutic Exercise: 8-22 mins                     1:57 PM, 06/29/19 Rosamaria Lints, PT, DPT Physical Therapist - [facility]  [phone] (ASCOM)    Atha Muradyan C 06/29/2019, 1:53 PM

## 2019-06-29 NOTE — Discharge Summary (Signed)
[facility] - [place] [facility]   PATIENT NAME: Abigail [name]    MR#:  [id number]  DATE OF BIRTH:  05-31-1960  DATE OF ADMISSION:  06/26/2019 ADMITTING PHYSICIAN: Hannah Beat, MD  DATE OF DISCHARGE: 06/29/2019  PRIMARY CARE PHYSICIAN: Joaquim Nam, MD    ADMISSION DIAGNOSIS:  Hepatic encephalopathy (HCC) [K72.90] Confusion [R41.0] Hyponatremia [E87.1]  DISCHARGE DIAGNOSIS:  Hepatic encephalopathy-- improved alcoholic liver disease chronic generalized Anasarca chronic hyponatremia severe malnutrition in the context of chronic illness SECONDARY DIAGNOSIS:   Past Medical History:  Diagnosis Date  . Alcoholism (HCC)   . Cholecystitis   . COVID-19 virus infection 03/2019  . GERD (gastroesophageal reflux disease)   . Headache    improved with gabapentin   . Hypertension   . Pancreatic insufficiency   . Pancreatitis 10/2015   2013 alcohol and/or gallstone  . Photosensitivity    (No formal dx of lupus) prev eval Dr. Gavin Potters in [place]  . Portal vein thrombosis 2019    HOSPITAL COURSE:   59 y.o.femalewith a known history of alcoholism, alcoholic liver disease,gastric bypass surgery 2005at [facility], hypertension, portal vein thrombosis 2019, GERD,chronicpancreatitis, portal vein thrombosis on Eliquis and previous history of COVID-19in March 08, 2019, presented to emergency room with acute onset of altered mental status with confusion and generalized weakness which have been noticed by her home health nurse.Please note patient was recently admitted at [facility] from 3/4-3/7 for similar presentation.  Acutemetabolicencephalopathy likely multifactorial secondary to hepatic encephalopathy as well as hyponatremia. -Continuelactulose  -ammonia down to 40 -consult GI-Dr. Tobi Bastos appreciated. Recommends outpatient follow-up after current hospitalization. Further workup for liver disease at present -serum sodium improving--today  130 -Discontinue Phenergan, trazodone--to avoid sedation  Generalized weakness -Multifactorial:Liver cirrhosis, chronic pancreatitis, poor appetite, low electrolytes, recent COVID-19 infection  History of portal venous thrombosis in 2019 Factor V Leyden disease -[facility] Dr. Donneta Romberg -Ultrasound duplex of lower extremitieson 3/5 showedbilateral DVT in theRight popliteal DVT and left common femoral and left femoral vein DVT.  -onEliquis   Alcoholic liver disease/generalized anasarca -History of chronic alcoholism. Extensive fatty liver and CT scan from November 2020. Ultrasound abdomen with findings of early liver cirrhosis. -Liver enzymes are slightly abnormal. INR elevated at 1.6. Albumin significantly low at 1.4 -Clinically has3+ bilateral pedal edema. No evidence of SBP. -Started on Lasix 20 mg IV TID along with albumin considering generalized anasarca--change to lasix 40 mg qd - nephrology consultation with Dr. Thedore Mins. -She had gastric bypass surgery done in 2005 at [facility] and has been taking nutritional supplements. She used to be a heavy drinker until a year ago and has chronic pancreatitis -per BF Mr Rolene Course he still sees empty alcohol bottles in the house--pt denies drinking! -dietitian consulted for nutritional supplement and education  Hyponatremia/hyperkalemia -Suspect hypervolemic hyponatremia.  -On admission, sodium was 122->125>>130 -started on Lasix 20 mg TID--chang eto po lasix 40 mg qd -patient recieved IV albumin infusions TID  ModerateProtein calorie malnutrition -Albumin level low 1.4. Her baseline albumin level already seems low between 2-2.5. Likely related to liver disease and poor intake. -Dietitian input appreciated  History of gastric bypass surgery 2005 -Probably followed by nutritional deficiencies. - Vitamin D level is low at 15on last admission requiringvitamin D supplementation -Macrocytosis due to  alcoholism  Chronic pancreatitis-- alcohol related  Recent COVID-19 infection -Did not need hospitalization at that time. She took outpatient monoclonal antibody infusion.  Type 2 diabetes mellitus. -Sliding scale insulin only.  Peripheral neuropathy. -Holding her  Neurontin to avoid sedation.  GERD. -continue Protonix.  History of portal vein thrombosisand bilateral DVT -continue her Eliquis.  Patient is at a very high risk for readmission given multiple comorbidities. TOC consulted for home health PT RN, aide discussed with patient's boyfriend Mr. Vida Rigger at length on the phone. Given importance of alcohol abstinence, medications, follow-up with PCP and gastroenterology. He seemed to voice understanding. He also understands patient has a very poor prognosis. Will arrange palliative care to follow as outpatient.  DVT prophylaxis:Eliquis Family Communication:discussed with BF Mr farley Disposition Plan:Home environment Barriers to DC-none   CONSULTS OBTAINED:  Treatment Team:  Murlean Iba, MD  DRUG ALLERGIES:   Allergies  Allergen Reactions  . Latex Rash    "hands turn beat red and start to itch"  . Atorvastatin Other (See Comments)    Swollen joints  . Dilaudid [Hydromorphone Hcl] Itching and Other (See Comments)    Itching- no rash- likely side effect but not true allergy.  Tolerates oxycodone.   L[place]a [Escitalopram Oxalate] Other (See Comments)    Psychological changes  . Lisinopril Other (See Comments)    Hives, presumed allergy  . Mirtazapine Swelling    swelling  . Nsaids     Gastritis 2013  . Tylenol [Acetaminophen] Other (See Comments)    Hx pancreatitis    DISCHARGE MEDICATIONS:   Allergies as of 06/29/2019      Reactions   Latex Rash   "hands turn beat red and start to itch"   Atorvastatin Other (See Comments)   Swollen joints   Dilaudid [hydromorphone Hcl] Itching, Other (See Comments)   Itching- no rash- likely side  effect but not true allergy.  Tolerates oxycodone.    Lexapro [escitalopram Oxalate] Other (See Comments)   Psychological changes   Lisinopril Other (See Comments)   Hives, presumed allergy   Mirtazapine Swelling   swelling   Nsaids    Gastritis 2013   Tylenol [acetaminophen] Other (See Comments)   Hx pancreatitis      Medication List    STOP taking these medications   cyanocobalamin 1000 MCG/ML injection Commonly known as: (VITAMIN B-12)   gabapentin 300 MG capsule Commonly known as: NEURONTIN   lidocaine 2 % solution Commonly known as: XYLOCAINE   promethazine 12.5 MG tablet Commonly known as: PHENERGAN     TAKE these medications   albuterol 108 (90 Base) MCG/ACT inhaler Commonly known as: VENTOLIN HFA Inhale 2 puffs into the lungs every 4 (four) hours as needed for wheezing or shortness of breath.   apixaban 5 MG Tabs tablet Commonly known as: ELIQUIS Take 1 tablet (5 mg total) by mouth 2 (two) times daily. What changed: Another medication with the same name was removed. Continue taking this medication, and follow the directions you see here.   B-D INSULIN SYRINGE 1CC/25GX1" 25G X 1" 1 ML Misc Generic drug: Insulin Syringe-Needle U-100 Inject 1 Syringe into the muscle every 14 (fourteen) days. For B12 injection   calcium-vitamin D 500-200 MG-UNIT tablet Commonly known as: OSCAL WITH D Take 1 tablet by mouth 2 (two) times daily.   feeding supplement (ENSURE ENLIVE) Liqd Take 237 mLs by mouth 3 (three) times daily between meals.   folic acid 1 MG tablet Commonly known as: FOLVITE Take 1 tablet (1 mg total) by mouth daily.   furosemide 40 MG tablet Commonly known as: Lasix Take 1 tablet (40 mg total) by mouth daily.   lactulose 10 GM/15ML solution Commonly known as: CHRONULAC Take 45  mLs (30 g total) by mouth 3 (three) times daily.   lipase/protease/amylase 16109 UNITS Cpep capsule Commonly known as: CREON Take 1 capsule (36,000 Units total) by mouth 3  (three) times daily before meals.   midodrine 5 MG tablet Commonly known as: PROAMATINE Take 1 tablet (5 mg total) by mouth 3 (three) times daily with meals.   multivitamin with minerals Tabs tablet Take 1 tablet by mouth daily. Start taking on: June 30, 2019   ondansetron 4 MG disintegrating tablet Commonly known as: Zofran ODT Take 1 tablet (4 mg total) by mouth every 8 (eight) hours as needed for nausea or vomiting.   pantoprazole 40 MG tablet Commonly known as: PROTONIX Take 1 tablet (40 mg total) by mouth daily.   thiamine 100 MG tablet Take 1 tablet (100 mg total) by mouth daily.   traMADol 50 MG tablet Commonly known as: ULTRAM TAKE ONE TABLET BY MOUTH EVERY 8 HOUR AS NEEDED FOR PAIN What changed: See the new instructions.   venlafaxine 75 MG tablet Commonly known as: EFFEXOR TAKE ONE TABLET BY MOUTH TWICE A DAY   Vitamin D (Ergocalciferol) 1.25 MG (50000 UNIT) Caps capsule Commonly known as: DRISDOL Take 1 capsule (50,000 Units total) by mouth every 7 (seven) days.       If you experience worsening of your admission symptoms, develop shortness of breath, life threatening emergency, suicidal or homicidal thoughts you must seek medical attention immediately by calling 911 or calling your MD immediately  if symptoms less severe.  You Must read complete instructions/literature along with all the possible adverse reactions/side effects for all the Medicines you take and that have been prescribed to you. Take any new Medicines after you have completely understood and accept all the possible adverse reactions/side effects.   Please note  You were cared for by a hospitalist during your hospital stay. If you have any questions about your discharge medications or the care you received while you were in the hospital after you are discharged, you can call the unit and asked to speak with the hospitalist on call if the hospitalist that took care of you is not available. Once  you are discharged, your primary care physician will handle any further medical issues. Please note that NO REFILLS for any discharge medications will be authorized once you are discharged, as it is imperative that you return to your primary care physician (or establish a relationship with a primary care physician if you do not have one) for your aftercare needs so that they can reassess your need for medications and monitor your lab values. Today   SUBJECTIVE  no new complaints weak  VITAL SIGNS:  Blood pressure 102/75, pulse 98, temperature 99.1 F (37.3 C), temperature source Oral, resp. rate 15, height 5' (1.524 m), weight 68 kg, SpO2 95 %.  I/O:  No intake or output data in the 24 hours ending 06/29/19 1152  PHYSICAL EXAMINATION:  GENERAL:  59 y.o.-year-old patient lying in the bed with no acute distress. Generalized anasarca, appears chronically ill EYES: Pupils equal, round, reactive to light and accommodation. + scleral icterus.  HEENT: Head atraumatic, normocephalic. Oropharynx and nasopharynx clear.  NECK:  Supple, no jugular venous distention. No thyroid enlargement, no tenderness.  LUNGS: Normal breath sounds bilaterally, no wheezing, rales,rhonchi or crepitation. No use of accessory muscles of respiration.  CARDIOVASCULAR: S1, S2 normal. No murmurs, rubs, or gallops.  ABDOMEN: Soft, non-tender, non-distended. Bowel sounds present. No organomegaly or mass.  EXTREMITIES: chronic bilateral pedal  edema, no cyanosis, or clubbing.  NEUROLOGIC: Cranial nerves II through XII are intact. Muscle strength 5/5 in all extremities. Sensation intact. Gait not checked. Weakness PSYCHIATRIC: The patient is alert and oriented x 3.  SKIN: No obvious rash, lesion, or ulcer.   DATA REVIEW:   CBC  Recent Labs  Lab 06/28/19 0503  WBC 9.1  HGB 9.0*  HCT 26.6*  PLT 267    Chemistries  Recent Labs  Lab 06/28/19 0503 06/28/19 0503 06/29/19 0453  NA 128*   < > 130*  K 4.4   < > 3.5   CL 100   < > 101  CO2 23   < > 22  GLUCOSE 88   < > 102*  BUN 23*   < > 21*  CREATININE 0.76   < > 0.84  CALCIUM 8.0*   < > 7.9*  AST 55*  --   --   ALT 29  --   --   ALKPHOS 100  --   --   BILITOT 3.2*  --   --    < > = values in this interval not displayed.    Microbiology Results   Recent Results (from the past 240 hour(s))  SARS CORONAVIRUS 2 (TAT 6-24 HRS) Nasopharyngeal Nasopharyngeal Swab     Status: None   Collection Time: 06/26/19  8:19 PM   Specimen: Nasopharyngeal Swab  Result Value Ref Range Status   SARS Coronavirus 2 NEGATIVE NEGATIVE Final    Comment: (NOTE) SARS-CoV-2 target nucleic acids are NOT DETECTED. The SARS-CoV-2 RNA is generally detectable in upper and lower respiratory specimens during the acute phase of infection. Negative results do not preclude SARS-CoV-2 infection, do not rule out co-infections with other pathogens, and should not be used as the sole basis for treatment or other patient management decisions. Negative results must be combined with clinical observations, patient history, and epidemiological information. The expected result is Negative. Fact Sheet for Patients: HairSlick.nohttps://www.fda.gov/media/138098/download Fact Sheet for Healthcare Providers: quierodirigir.comhttps://www.fda.gov/media/138095/download This test is not yet approved or cleared by the [place] FDA and  has been authorized for detection and/or diagnosis of SARS-CoV-2 by FDA under an Emergency Use Authorization (EUA). This EUA will remain  in effect (meaning this test can be used) for the duration of the COVID-19 declaration under Section 56 4(b)(1) of the Act, 21 U.S.C. section 360bbb-3(b)(1), unless the authorization is terminated or revoked sooner. Performed at [facility], [address] [place], [place] [address]     RADIOLOGY:  MR BRAIN WO CONTRAST  Result Date: 06/28/2019 CLINICAL DATA:  59 year old female with vision changes, left eyelid drooping. EXAM: MRI  HEAD WITHOUT CONTRAST TECHNIQUE: Multiplanar, multiecho pulse sequences of the brain and surrounding structures were obtained without intravenous contrast. COMPARISON:  Head CT without contrast 06/26/2019. Report of brain MRI 09/26/2015 (no images available). FINDINGS: Brain: No restricted diffusion to suggest acute infarction. No midline shift, mass effect, evidence of mass lesion, ventriculomegaly, extra-axial collection or acute intracranial hemorrhage. Cervicomedullary junction and pituitary are within normal limits. Mild scaphocephaly. Minimal to mild mostly anterior frontal lobe nonspecific white matter T2 and FLAIR hyperintense foci, also described in 2017. No cortical encephalomalacia or chronic cerebral blood products. The deep gray nuclei, midbrain, brainstem and cerebellum are within normal limits. Vascular: Major intracranial vascular flow voids are preserved. Skull and upper cervical spine: Hyperostosis of the calvarium, described in 2017. Heterogeneous marrow signal but no suspicious marrow lesion identified. Mild degenerative ligamentous hypertrophy about the odontoid. Sinuses/Orbits:  Normal suprasellar cistern and optic chiasm. Orbits soft tissues appears symmetric. There is mildly Disconjugate gaze (series 14, image 8). Paranasal sinuses are clear. Other: Trace bilateral mastoid fluid. Negative visible pharynx. Scalp and face soft tissues appear negative. IMPRESSION: 1. No acute intracranial abnormality. 2. Stable by report since 2017 and largely unremarkable for age noncontrast MRI appearance of the brain. Electronically Signed   By: Odessa Fleming M.D.   On: 06/28/2019 21:55     CODE STATUS:     Code Status Orders  (From admission, onward)         Start     Ordered   06/26/19 2025  Full code  Continuous     06/26/19 2026        Code Status History    Date Active Date Inactive Code Status Order ID Comments User Context   06/08/2019 1739 06/11/2019 1614 Full Code [id number]  Lorin Glass,  MD ED   05/13/2017 1628 05/16/2017 1506 Full Code [id number]  Bertrum Sol, MD Inpatient   10/26/2015 1616 10/28/2015 1734 Full Code [id number]  Marguarite Arbour, MD Inpatient   04/19/2012 1724 04/21/2012 1643 Full Code [id number]  Harlon Flor, Harriette Bouillon, RN Inpatient   Advance Care Planning Activity       TOTAL TIME TAKING CARE OF THIS PATIENT: *40* minutes.    Enedina Finner M.D  [facility]    CC: Primary care physician; Joaquim Nam, MD

## 2019-06-29 NOTE — Progress Notes (Signed)
Pt d/c home all d/c paperwork was reviewed pt verbalized understanding.  IVs removed to pts bilateral arms and foam was removed from bottom.  Belongings packed up including cellphone wallet and purse.  Pt was assisted into staxi and wheeled down to med mall ent.  She was assisted into personal vehicle by CNA and this nurse.  Boyfriend questioned where pts personal walker was, this nurse called the unit and asked if pts room could be search for personal walker, no personal walker was found.  Boyfriend stated they would "figure it out" as he states he hasn't seen it at the house.  Pt was brought to [facility] via EMS.  NAD noted at dc.

## 2019-06-29 NOTE — Telephone Encounter (Signed)
Patient called stating that she can not find her medications: Eliquis, Folic Acid, Furosemide and lactulose solution. She thinks when she was taking by EMS to the hospital recently that she had her medications with her and they were not transferred over with her to the hospital. Her boyfriend, Orpah Greek, was with patient during the call and said this also in the background. I spoke with Artelia Laroche about this situation. I advised that she can continue searching her home in case she finds them and second step would be to call her pharmacy and tell them what happened and ask them to supply her with enough of medication to last till insurance can cover for the next refill and check the cost. Per boyfriend in the background, it did not matter about the cost she needs the medication and they would get it. Patient verbalized understanding.  FYI to PCP

## 2019-06-30 LAB — ANA W/REFLEX IF POSITIVE: Anti Nuclear Antibody (ANA): POSITIVE — AB

## 2019-06-30 LAB — ENA+DNA/DS+ANTICH+CENTRO+JO...
Centromere Ab Screen: 3.9 AI — ABNORMAL HIGH (ref 0.0–0.9)
Scleroderma (Scl-70) (ENA) Antibody, IgG: 1.6 AI — ABNORMAL HIGH (ref 0.0–0.9)

## 2019-06-30 LAB — COPPER, SERUM: Copper: 56 ug/dL — ABNORMAL LOW (ref 80–158)

## 2019-06-30 LAB — ZINC: Zinc: 42 ug/dL — ABNORMAL LOW (ref 44–115)

## 2019-07-02 NOTE — Telephone Encounter (Signed)
Noted. Thanks.

## 2019-07-02 NOTE — Telephone Encounter (Signed)
Agreed.  Thanks.  That was good advice.

## 2019-07-03 LAB — VITAMIN E
Vitamin E (Alpha Tocopherol): 2.2 mg/L — ABNORMAL LOW (ref 7.0–25.1)
Vitamin E(Gamma Tocopherol): 0.3 mg/L — ABNORMAL LOW (ref 0.5–5.5)

## 2019-07-03 LAB — VITAMIN C: Vitamin C: 0.9 mg/dL (ref 0.4–2.0)

## 2019-07-03 NOTE — Progress Notes (Signed)
Abigail [name]   Please inform the patient that her copper and zinc levels are low.  Suggest to take over-the-counter multivitamin which has zinc and copper in it and to recheck levels in 3 months.  Can be rechecked by her physician or if she has follow-up with [place]   Dr [name] MD,MRCP [facility]) Gastroenterology/Hepatology Pager: [phone]

## 2019-07-04 ENCOUNTER — Other Ambulatory Visit: Payer: Self-pay | Admitting: *Deleted

## 2019-07-04 ENCOUNTER — Telehealth: Payer: Self-pay | Admitting: Family Medicine

## 2019-07-04 NOTE — Telephone Encounter (Signed)
Faxed refill request. Eliquis Last office visit:   06/08/2019 Last Filled:    60 tablet 0 06/17/2019  Please advise.

## 2019-07-04 NOTE — Telephone Encounter (Signed)
Please give the order.  Thanks.   

## 2019-07-04 NOTE — Telephone Encounter (Signed)
Cristal Deer from [facility] called to request verbal orders. States they need the okay for patient to have PT 2 times a week for 5 weeks. He can be reached at [phone].

## 2019-07-04 NOTE — Telephone Encounter (Signed)
Christopher with [facility] advised.

## 2019-07-05 ENCOUNTER — Telehealth: Payer: Self-pay

## 2019-07-05 MED ORDER — APIXABAN 5 MG PO TABS: 5.0000 mg | ORAL_TABLET | Freq: Two times a day (BID) | ORAL | 2 refills | Status: DC

## 2019-07-05 NOTE — Telephone Encounter (Signed)
Called pt to inform her of lab results and Dr. Johnney Killian suggestions.  Unable to contact, LVM to return call

## 2019-07-05 NOTE — Telephone Encounter (Signed)
Please give the orders.  Please continue with Lasix in the meantime and let me know if the swelling is getting worse. She has follow-up scheduled here in the meantime.  Thanks.

## 2019-07-05 NOTE — Telephone Encounter (Signed)
-----   Message from [name], MD sent at 07/03/2019 12:18 PM EDT ----- Abigail [name]   Please inform the patient that her copper and zinc levels are low.  Suggest to take over-the-counter multivitamin which has zinc and copper in it and to recheck levels in 3 months.  Can be rechecked by her physician or if she has follow-up with [place]   Dr [name] MD,MRCP [facility]) Gastroenterology/Hepatology Pager: [phone]

## 2019-07-05 NOTE — Telephone Encounter (Signed)
Sent. Thanks.   

## 2019-07-05 NOTE — Telephone Encounter (Signed)
Spoke with pt and informed her of lab results and Dr. Anna's recommendations. Pt understands and agrees. 

## 2019-07-05 NOTE — Telephone Encounter (Signed)
Lillia Abed nurse with [facility] requesting verbal orders for [facility] skilled nursing 2 x a wk for 1 wk (being this week) and 1 x a wk for 4 wks. Also request verbal orders for for [facility] aide 2 x a wk for 4 wks for bathing and dressing assistance. Also pt has a stage 2 pressure ulcer on lt buttock from hospitalization and Lillia Abed request order for Desitin to that area. Pt also has significant pitting edema in feet, ankles and legs and wants to verify Dr Para March is aware. Pt is taking Lasix 40 mg daily. No difficulty breathing. Lillia Abed request cb on 07/06/19.

## 2019-07-06 NOTE — Telephone Encounter (Signed)
Lindsey from [facility] advised.

## 2019-07-07 LAB — VITAMIN A: Vitamin A (Retinoic Acid): <2.5 ug/dL — ABNORMAL LOW (ref 20.1–62.0)

## 2019-07-08 ENCOUNTER — Other Ambulatory Visit: Payer: Self-pay | Admitting: Family Medicine

## 2019-07-09 ENCOUNTER — Other Ambulatory Visit: Payer: Self-pay | Admitting: Family Medicine

## 2019-07-10 ENCOUNTER — Encounter: Payer: Self-pay | Admitting: Family Medicine

## 2019-07-10 ENCOUNTER — Ambulatory Visit (INDEPENDENT_AMBULATORY_CARE_PROVIDER_SITE_OTHER): Payer: 59 | Admitting: Family Medicine

## 2019-07-10 ENCOUNTER — Other Ambulatory Visit: Payer: Self-pay

## 2019-07-10 ENCOUNTER — Telehealth: Payer: Self-pay

## 2019-07-10 VITALS — BP 102/60 | HR 93 | Temp 97.2°F | Ht 60.0 in

## 2019-07-10 DIAGNOSIS — K8689 Other specified diseases of pancreas: Secondary | ICD-10-CM

## 2019-07-10 DIAGNOSIS — R609 Edema, unspecified: Secondary | ICD-10-CM | POA: Diagnosis not present

## 2019-07-10 DIAGNOSIS — E538 Deficiency of other specified B group vitamins: Secondary | ICD-10-CM | POA: Diagnosis not present

## 2019-07-10 DIAGNOSIS — E8809 Other disorders of plasma-protein metabolism, not elsewhere classified: Secondary | ICD-10-CM | POA: Diagnosis not present

## 2019-07-10 DIAGNOSIS — E871 Hypo-osmolality and hyponatremia: Secondary | ICD-10-CM

## 2019-07-10 DIAGNOSIS — K7290 Hepatic failure, unspecified without coma: Secondary | ICD-10-CM

## 2019-07-10 DIAGNOSIS — I81 Portal vein thrombosis: Secondary | ICD-10-CM

## 2019-07-10 DIAGNOSIS — K219 Gastro-esophageal reflux disease without esophagitis: Secondary | ICD-10-CM

## 2019-07-10 DIAGNOSIS — K7682 Hepatic encephalopathy: Secondary | ICD-10-CM

## 2019-07-10 DIAGNOSIS — R519 Headache, unspecified: Secondary | ICD-10-CM

## 2019-07-10 LAB — CBC WITH DIFFERENTIAL/PLATELET
Basophils Absolute: 0 10*3/uL (ref 0.0–0.1)
Basophils Relative: 0.3 % (ref 0.0–3.0)
Eosinophils Absolute: 0 10*3/uL (ref 0.0–0.7)
Eosinophils Relative: 0 % (ref 0.0–5.0)
HCT: 33 % — ABNORMAL LOW (ref 36.0–46.0)
Hemoglobin: 11.3 g/dL — ABNORMAL LOW (ref 12.0–15.0)
Lymphocytes Relative: 41.1 % (ref 12.0–46.0)
Lymphs Abs: 3.4 10*3/uL (ref 0.7–4.0)
MCHC: 34.1 g/dL (ref 30.0–36.0)
MCV: 97.1 fl (ref 78.0–100.0)
Monocytes Absolute: 0.8 10*3/uL (ref 0.1–1.0)
Monocytes Relative: 10.1 % (ref 3.0–12.0)
Neutro Abs: 4 10*3/uL (ref 1.4–7.7)
Neutrophils Relative %: 48.5 % (ref 43.0–77.0)
Platelets: 382 10*3/uL (ref 150.0–400.0)
RBC: 3.4 Mil/uL — ABNORMAL LOW (ref 3.87–5.11)
RDW: 16 % — ABNORMAL HIGH (ref 11.5–15.5)
WBC: 8.3 10*3/uL (ref 4.0–10.5)

## 2019-07-10 LAB — COMPREHENSIVE METABOLIC PANEL
ALT: 26 U/L (ref 0–35)
AST: 44 U/L — ABNORMAL HIGH (ref 0–37)
Albumin: 1.7 g/dL — ABNORMAL LOW (ref 3.5–5.2)
Alkaline Phosphatase: 101 U/L (ref 39–117)
BUN: 15 mg/dL (ref 6–23)
CO2: 31 mEq/L (ref 19–32)
Calcium: 7.4 mg/dL — ABNORMAL LOW (ref 8.4–10.5)
Chloride: 92 mEq/L — ABNORMAL LOW (ref 96–112)
Creatinine, Ser: 0.91 mg/dL (ref 0.40–1.20)
GFR: 63.86 mL/min (ref >60.00)
Glucose, Bld: 100 mg/dL — ABNORMAL HIGH (ref 70–99)
Potassium: 3 mEq/L — ABNORMAL LOW (ref 3.5–5.1)
Sodium: 129 mEq/L — ABNORMAL LOW (ref 135–145)
Total Bilirubin: 1.5 mg/dL — ABNORMAL HIGH (ref 0.2–1.2)
Total Protein: 5.8 g/dL — ABNORMAL LOW (ref 6.0–8.3)

## 2019-07-10 MED ORDER — LACTULOSE 10 GM/15ML PO SOLN: 30.0000 g | Freq: Three times a day (TID) | ORAL | 5 refills | Status: DC

## 2019-07-10 MED ORDER — ONDANSETRON 4 MG PO TBDP: 4.0000 mg | ORAL_TABLET | Freq: Three times a day (TID) | ORAL | 3 refills | Status: DC | PRN

## 2019-07-10 MED ORDER — GABAPENTIN 100 MG PO CAPS: 100.0000 mg | ORAL_CAPSULE | Freq: Two times a day (BID) | ORAL | 3 refills | Status: DC | PRN

## 2019-07-10 MED ORDER — VITAMIN D (ERGOCALCIFEROL) 1.25 MG (50000 UNIT) PO CAPS: 50000.0000 [IU] | ORAL_CAPSULE | ORAL | 0 refills | Status: DC

## 2019-07-10 MED ORDER — FOLIC ACID 1 MG PO TABS: 1.0000 mg | ORAL_TABLET | Freq: Every day | ORAL | 3 refills | Status: DC

## 2019-07-10 MED ORDER — MIDODRINE HCL 5 MG PO TABS: 5.0000 mg | ORAL_TABLET | Freq: Three times a day (TID) | ORAL | 3 refills | Status: DC

## 2019-07-10 NOTE — Telephone Encounter (Signed)
-----   Message from [name], MD sent at 07/06/2019 10:21 AM EDT ----- Inform the patient that her vitamin E levels was very low.  Ensure that the vitamin tablet that she is taking also has vitamin E and we will recheck it in the future

## 2019-07-10 NOTE — Progress Notes (Signed)
This visit occurred during the SARS-CoV-2 public health emergency.  Safety protocols were in place, including screening questions prior to the visit, additional usage of staff PPE, and extensive cleaning of exam room while observing appropriate contact time as indicated for disinfecting solutions.  Inpatient f/u. Inpatient course d/w pt.   =================== DATE OF ADMISSION:  06/26/2019    ADMITTING PHYSICIAN: Christel Mormon, MD  DATE OF DISCHARGE: 06/29/2019  PRIMARY CARE PHYSICIAN: Tonia Ghent, MD   ADMISSION DIAGNOSIS:  Hepatic encephalopathy ([place]) [K72.90] Confusion [R41.0] Hyponatremia [E87.1]  DISCHARGE DIAGNOSIS:  Hepatic encephalopathy-- improved alcoholic liver disease chronic generalized Anasarca chronic hyponatremia severe malnutrition in the context of chronic illness SECONDARY DIAGNOSIS:       Past Medical History:  Diagnosis Date  . Alcoholism ([place])   . Cholecystitis   . COVID-19 virus infection 03/2019  . GERD (gastroesophageal reflux disease)   . Headache    improved with gabapentin   . Hypertension   . Pancreatic insufficiency   . Pancreatitis 10/2015   2013 alcohol and/or gallstone  . Photosensitivity    (No formal dx of lupus) prev eval Dr. Jefm Bryant in [place]  . Portal vein thrombosis 2019    HOSPITAL COURSE:   59 y.o.femalewith a known history of alcoholism, alcoholic liver disease,gastric bypass surgery 2005at [facility], hypertension, portal vein thrombosis 2019, GERD,chronicpancreatitis, portal vein thrombosis on Eliquis and previous history of COVID-19in March 08, 2019, presented to emergency room with acute onset of altered mental status with confusion and generalized weakness which have been noticed by her home health nurse.Please note patient was recently admitted at [facility] from 3/4-3/7 for similar presentation.  Acutemetabolicencephalopathy likely multifactorial secondary to hepatic encephalopathy as well  as hyponatremia. -Continuelactulose  -ammonia down to 40 -consult GI-Dr. Annaappreciated. Recommends outpatient follow-up after current hospitalization. Further workup for liver disease at present -serum sodium improving--today 130 -Discontinue Phenergan, trazodone--to avoid sedation  Generalized weakness -Multifactorial:Liver cirrhosis, chronic pancreatitis, poor appetite, low electrolytes, recent COVID-19 infection  History of portal venous thrombosis in 2019 Factor V Leyden disease -[facility] [place]. Rogue Bussing -Ultrasound duplex of lower extremitieson 3/5 showedbilateral DVTin theRight popliteal DVT and left common femoral and left femoral vein DVT.  -onEliquis   Alcoholic liver disease/generalized anasarca -History of chronic alcoholism. Extensive fatty liver and CT scan from November 2020. Ultrasound abdomen with findings of early liver cirrhosis. -Liver enzymes are slightly abnormal. INR elevated at 1.6. Albumin significantly low at 1.4 -Clinically has3+ bilateral pedal edema. No evidence of SBP. -Started on Lasix 20 mg IV TIDalong with albumin considering generalized anasarca--change to lasix 40 mg qd - nephrology consultationwith Dr. Candiss Norse. -She had gastric bypass surgery done in 2005 at [facility] and has been taking nutritional supplements. She used to be a heavy drinker until a year ago and has chronic pancreatitis -per BF Mr Vida Rigger he still sees empty alcohol bottles in the house--pt denies drinking! -dietitian consulted for nutritional supplement and education  Hyponatremia/hyperkalemia -Suspect hypervolemic hyponatremia.  -On admission, sodium was 122->125>>130 -started on Lasix 20 mg TID--chang eto po lasix 40 mg qd -patient recieved IV albumin infusions TID  ModerateProtein calorie malnutrition -Albumin level low 1.4. Her baseline albumin level already seems low between 2-2.5. Likely related to liver disease and poor  intake. -Dietitian input appreciated  History of gastric bypass surgery 2005 -Probably followed by nutritional deficiencies. 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 She took outpatient monoclonal antibody infusion.  Type 2 diabetes mellitus. -Sliding scale insulin only.  Peripheral neuropathy. -Holding her Neurontin to avoid sedation.  GERD. -continue Protonix.  History of portal vein thrombosisand bilateral DVT -continue her Eliquis.  Patient is at a very high risk for readmission given multiple comorbidities. TOC consulted for home health PT RN, aide discussed with patient's boyfriend Mr. Rolene Course at length on the phone. Given importance of alcohol abstinence, medications, follow-up with PCP and gastroenterology. He seemed to voice understanding. He also understands patient has a very poor prognosis. Will arrange palliative care to follow as outpatient.  DVT prophylaxis:Eliquis Family Communication:discussed withBF Mr farley Disposition Plan:Home environment Barriers to DC-none  ===================   Acutemetabolicencephalopathy likely multifactorial secondary to hepatic encephalopathy as well as hyponatremia. Still on lactulose.  Confusion is intermittent, she got mixed up on her appointment time today.  She is better but not resolved.  She has 1-2 BM2 per day.  See notes on f/u labs.    Generalized weakness  She has trouble getting to standing.  She had walk once she is up.  She has PT and an aid coming out.  We talked about rehab placement, potentially in the future.  We''ll check with HH SW.    History of portal venous thrombosis in 2019 Factor V Leyden disease Still on Eliquis.  No bleeding.    Alcoholic liver disease/generalized anasarca No etoh  recently, with f/u labs pending.   Hyponatremia/hyperkalemia Still on lasix, with f/u labs pending.   ModerateProtein calorie malnutrition -Albumin level low 1.4. Her baseline albumin level already seems low between 2-2.5. Likely related to liver disease and poor intake, with f/u labs pending.   History of gastric bypass surgery 2005 -Probably followed by nutritional deficiencies. Still in vit D/MVI, d/w pt.    Chronic pancreatitis-- see above.    Peripheral neuropathy. She has more pain off gabapentin in the meantime.  She has had more HA in the meantime. Discussed restarting gabapentin at low dose.    GERD/vomiting.   -continued on Protonix.  Vomited most recently this AM.  She has vomited most days, a few times per day.  She had used zofran with relief but no recent use.  Discussed restarting.    B12 level recently not low.   Meds, vitals, and allergies reviewed.   ROS: Per HPI unless specifically indicated in ROS section   GEN: nad, alert and oriented, in wheelchair, oriented to year month date, knows recent holiday/Easter.   HEENT: ncat, no icterus NECK: supple w/o LA CV: rrr.  PULM: ctab, no inc wob ABD: soft, +bs EXT: 2+ BLE edema SKIN: no acute rash, no jaundice, no asterixis.

## 2019-07-10 NOTE — Patient Instructions (Addendum)
Use gabapentin once a day for now.  Okay to go up to twice a day if needed, hold if drowsy.  Let me check on options about rehab and nursing.  Go to the lab on the way out.   If you have mychart we'll likely use that to update you.   Take care.  Glad to see you.

## 2019-07-10 NOTE — Telephone Encounter (Addendum)
Electronic refill request. Omeprazole Last office visit:   07/10/2019   Name from pharmacy: OMEPRAZOLE DR 40 MG CAPSULE        Will file in chart as: omeprazole (PRILOSEC) 40 MG capsule   The original prescription was discontinued on 06/11/2019 by Burnadette Pop, MD for the following reason: Stop Taking at Discharge. Renewing this prescription may not be appropriate.   Sig: TAKE ONE CAPSULE BY MOUTH DAILY   Disp:  30 capsule  Refills:  0   Name from pharmacy: CYANOCOBALAMIN 1,000 MCG/ML MDV        Will file in chart as: cyanocobalamin (,VITAMIN B-12,) 1000 MCG/ML injection   The original prescription was discontinued on 06/29/2019 by Enedina Finner, MD for the following reason: Stop Taking at Discharge. Renewing this prescription may not be appropriate.   Sig: INJECT INTRAMUSCULARLY EVERY 14 DAYS   Disp:  1 mL (Pharmacy requested: 1 each)  Refills:  1

## 2019-07-10 NOTE — Telephone Encounter (Signed)
Spoke with pt and informed her of lab result and Dr. Johnney Killian recommendation. Pt understands and agrees.

## 2019-07-11 NOTE — Telephone Encounter (Signed)
Okay to stay off B12 for now given recent high level and prilosec was changed to protonix.  I denied the rxs.  Thanks.

## 2019-07-12 NOTE — Assessment & Plan Note (Signed)
She has more pain off gabapentin in the meantime.  She has had more HA in the meantime. Discussed restarting gabapentin at low dose with sedation caution.

## 2019-07-12 NOTE — Assessment & Plan Note (Signed)
-   Continue Eliquis 

## 2019-07-12 NOTE — Assessment & Plan Note (Signed)
Recheck labs today, continue lactulose. Will see about placement esp given her overall deconditioning.  See notes on labs.

## 2019-07-12 NOTE — Assessment & Plan Note (Signed)
Restart zofran.

## 2019-07-12 NOTE — Assessment & Plan Note (Signed)
Recent level not now.  D/w pt.

## 2019-07-12 NOTE — Assessment & Plan Note (Signed)
Continue lasix.  Will likely need to inc PO protein intake.

## 2019-07-12 NOTE — Assessment & Plan Note (Signed)
No etoh.  See notes on labs.

## 2019-07-12 NOTE — Assessment & Plan Note (Signed)
See above

## 2019-07-17 ENCOUNTER — Telehealth: Payer: Self-pay | Admitting: Family Medicine

## 2019-07-17 DIAGNOSIS — I81 Portal vein thrombosis: Secondary | ICD-10-CM

## 2019-07-17 DIAGNOSIS — K7682 Hepatic encephalopathy: Secondary | ICD-10-CM

## 2019-07-17 DIAGNOSIS — K8689 Other specified diseases of pancreas: Secondary | ICD-10-CM

## 2019-07-17 DIAGNOSIS — K7290 Hepatic failure, unspecified without coma: Secondary | ICD-10-CM

## 2019-07-17 NOTE — Telephone Encounter (Signed)
Kristie with [facility] called today  She stated that the patient significant other Ernie called them. He was very upset. Stated the patient seems to be giving up, she does not want to get out of the bed or do anything.   Danford Bad stated that they would like her to have a hospice assessment done and would like a referral from you for this. They also wanted to make sure you are in agreement with the referral and will be the attending provider for the patient.    Danford Bad requested a call back to update her when the referral is done and to confirm you being the attending provider     Kristie's Call back # [phone]

## 2019-07-17 NOTE — Telephone Encounter (Signed)
Abigail [name] with [facility] advised.

## 2019-07-17 NOTE — Telephone Encounter (Signed)
Yes, please, assuming patient consents (assuming she can consent).

## 2019-07-17 NOTE — Telephone Encounter (Signed)
Patient's significant other called stating that he has contacted Hospice to come out and do an evaluation on Abigail [name]. Ernie stated that patient has deteriorated significantly over the weekend. Ernie stated that he is hoping that hospice can come out today and do an evaluation and requested that  Dr. Para March approve the evaluation.

## 2019-07-18 ENCOUNTER — Telehealth: Payer: Self-pay | Admitting: Family Medicine

## 2019-07-18 MED ORDER — FUROSEMIDE 80 MG PO TABS: 80.0000 mg | ORAL_TABLET | Freq: Two times a day (BID) | ORAL | 3 refills | Status: DC | PRN

## 2019-07-18 MED ORDER — OXYCODONE HCL 5 MG PO TABS: 2.5000 mg | ORAL_TABLET | Freq: Four times a day (QID) | ORAL | 0 refills | Status: DC | PRN

## 2019-07-18 MED ORDER — CEPHALEXIN 500 MG PO CAPS: 500.0000 mg | ORAL_CAPSULE | Freq: Three times a day (TID) | ORAL | 0 refills | Status: DC

## 2019-07-18 MED ORDER — POTASSIUM CHLORIDE CRYS ER 20 MEQ PO TBCR: EXTENDED_RELEASE_TABLET | ORAL | 3 refills | Status: DC

## 2019-07-18 NOTE — Telephone Encounter (Signed)
I would avoid tylenol and nsaids.  Would try oxycodone for pain.  rx sent.  Please let me know how that works for patient.  Sedation caution on oxycodone.    Would inc lasix to 80mg  a day and let me know if that isn't helping with edema.   If needed okay to give a 2nd dose of 80mg  lasix daily.  Would take 1 potassium tab with each dose of lasix.   If she has a fever or increasing redness on legs, then start keflex.  If no fever, then okay to hold x24 hours and see if dec in edema from lasix helps with skin changes on the legs.   I thank all involved.

## 2019-07-18 NOTE — Telephone Encounter (Signed)
Received a call from [facility] with Hospice [facility] Requesting pain meds for patient ASAP - patient has cirrhosis of the liver and has ben complaining of a headache which is 6-8/10 on the pain scale and constant. Requesting something be called in ASAP to [employer].   Also requesting that the patient's Lasix be increased -- currently on 40mg  qd Pt has bilateral pitting edema, 3-4+ and possible cellulitis in both legs as well.  She will need Potassium sent in if the Lasix is increased as she does not have any of this on hand.

## 2019-07-18 NOTE — Telephone Encounter (Signed)
Sheralyn Boatman advised and spoke with Dr. Para March with additional questions.

## 2019-07-18 NOTE — Telephone Encounter (Signed)
Orders below discussed and okay to hold keflex tonight and see about response to lasix.  Would be reasonable to place foley if needed for comfort (urinary frequency, difficultly getting to bathroom, may dec risk to skin in sacral area).  I thank all involved.

## 2019-07-25 ENCOUNTER — Other Ambulatory Visit: Payer: Self-pay | Admitting: *Deleted

## 2019-07-25 ENCOUNTER — Other Ambulatory Visit: Payer: Self-pay | Admitting: Family Medicine

## 2019-07-25 MED ORDER — OXYCODONE HCL 5 MG PO TABS: 2.5000 mg | ORAL_TABLET | Freq: Four times a day (QID) | ORAL | 0 refills | Status: DC | PRN

## 2019-07-25 NOTE — Telephone Encounter (Signed)
Electronic refill request. Creon Last office visit:   07/10/2019 Last Filled:    90 capsule 1 05/22/2019  Please advise.

## 2019-07-25 NOTE — Telephone Encounter (Signed)
Sent. Thanks.   

## 2019-07-25 NOTE — Telephone Encounter (Signed)
Patient's friend left a voicemail requesting a refill on Oxycodone. Mr. Rolene Course stated that patient only has one pill left and requested that this be refilled as soon as possible. Last refill 07/18/19 #20 Last office visit 07/10/19

## 2019-07-26 ENCOUNTER — Inpatient Hospital Stay
Admission: EM | Admit: 2019-07-26 | Discharge: 2019-07-27 | DRG: 871 | Disposition: A | Discharge status: Hospice | Payer: 59 | Attending: Internal Medicine | Admitting: Internal Medicine

## 2019-07-26 ENCOUNTER — Emergency Department: Payer: 59

## 2019-07-26 ENCOUNTER — Other Ambulatory Visit: Payer: Self-pay

## 2019-07-26 DIAGNOSIS — Z8616 Personal history of COVID-19: Secondary | ICD-10-CM

## 2019-07-26 DIAGNOSIS — R601 Generalized edema: Secondary | ICD-10-CM

## 2019-07-26 DIAGNOSIS — I272 Pulmonary hypertension, unspecified: Secondary | ICD-10-CM | POA: Diagnosis present

## 2019-07-26 DIAGNOSIS — F1020 Alcohol dependence, uncomplicated: Secondary | ICD-10-CM | POA: Diagnosis present

## 2019-07-26 DIAGNOSIS — K7040 Alcoholic hepatic failure without coma: Secondary | ICD-10-CM | POA: Diagnosis present

## 2019-07-26 DIAGNOSIS — A419 Sepsis, unspecified organism: Principal | ICD-10-CM | POA: Diagnosis present

## 2019-07-26 DIAGNOSIS — J188 Other pneumonia, unspecified organism: Secondary | ICD-10-CM

## 2019-07-26 DIAGNOSIS — K7030 Alcoholic cirrhosis of liver without ascites: Secondary | ICD-10-CM | POA: Diagnosis present

## 2019-07-26 DIAGNOSIS — I81 Portal vein thrombosis: Secondary | ICD-10-CM | POA: Diagnosis present

## 2019-07-26 DIAGNOSIS — J9601 Acute respiratory failure with hypoxia: Secondary | ICD-10-CM | POA: Diagnosis present

## 2019-07-26 DIAGNOSIS — E871 Hypo-osmolality and hyponatremia: Secondary | ICD-10-CM | POA: Diagnosis present

## 2019-07-26 DIAGNOSIS — K219 Gastro-esophageal reflux disease without esophagitis: Secondary | ICD-10-CM | POA: Diagnosis present

## 2019-07-26 DIAGNOSIS — Z885 Allergy status to narcotic agent status: Secondary | ICD-10-CM

## 2019-07-26 DIAGNOSIS — Y95 Nosocomial condition: Secondary | ICD-10-CM | POA: Diagnosis present

## 2019-07-26 DIAGNOSIS — J189 Pneumonia, unspecified organism: Secondary | ICD-10-CM | POA: Diagnosis present

## 2019-07-26 DIAGNOSIS — I10 Essential (primary) hypertension: Secondary | ICD-10-CM | POA: Diagnosis present

## 2019-07-26 DIAGNOSIS — K7031 Alcoholic cirrhosis of liver with ascites: Secondary | ICD-10-CM | POA: Diagnosis present

## 2019-07-26 DIAGNOSIS — K659 Peritonitis, unspecified: Secondary | ICD-10-CM

## 2019-07-26 DIAGNOSIS — G9341 Metabolic encephalopathy: Secondary | ICD-10-CM | POA: Diagnosis present

## 2019-07-26 DIAGNOSIS — Z8249 Family history of ischemic heart disease and other diseases of the circulatory system: Secondary | ICD-10-CM

## 2019-07-26 DIAGNOSIS — Z9049 Acquired absence of other specified parts of digestive tract: Secondary | ICD-10-CM

## 2019-07-26 DIAGNOSIS — K861 Other chronic pancreatitis: Secondary | ICD-10-CM | POA: Diagnosis present

## 2019-07-26 DIAGNOSIS — K7200 Acute and subacute hepatic failure without coma: Secondary | ICD-10-CM

## 2019-07-26 DIAGNOSIS — Z515 Encounter for palliative care: Secondary | ICD-10-CM | POA: Diagnosis present

## 2019-07-26 DIAGNOSIS — N179 Acute kidney failure, unspecified: Secondary | ICD-10-CM | POA: Diagnosis present

## 2019-07-26 DIAGNOSIS — R6520 Severe sepsis without septic shock: Secondary | ICD-10-CM

## 2019-07-26 DIAGNOSIS — Z86718 Personal history of other venous thrombosis and embolism: Secondary | ICD-10-CM | POA: Diagnosis not present

## 2019-07-26 DIAGNOSIS — D6851 Activated protein C resistance: Secondary | ICD-10-CM | POA: Diagnosis present

## 2019-07-26 DIAGNOSIS — E8770 Fluid overload, unspecified: Secondary | ICD-10-CM | POA: Diagnosis present

## 2019-07-26 DIAGNOSIS — Z79899 Other long term (current) drug therapy: Secondary | ICD-10-CM

## 2019-07-26 DIAGNOSIS — Z886 Allergy status to analgesic agent status: Secondary | ICD-10-CM

## 2019-07-26 DIAGNOSIS — Z794 Long term (current) use of insulin: Secondary | ICD-10-CM

## 2019-07-26 DIAGNOSIS — E8809 Other disorders of plasma-protein metabolism, not elsewhere classified: Secondary | ICD-10-CM | POA: Diagnosis present

## 2019-07-26 DIAGNOSIS — Z66 Do not resuscitate: Secondary | ICD-10-CM | POA: Diagnosis present

## 2019-07-26 DIAGNOSIS — Z9884 Bariatric surgery status: Secondary | ICD-10-CM

## 2019-07-26 DIAGNOSIS — Z888 Allergy status to other drugs, medicaments and biological substances status: Secondary | ICD-10-CM

## 2019-07-26 DIAGNOSIS — Z91040 Latex allergy status: Secondary | ICD-10-CM

## 2019-07-26 DIAGNOSIS — K7682 Hepatic encephalopathy: Secondary | ICD-10-CM

## 2019-07-26 DIAGNOSIS — Z79891 Long term (current) use of opiate analgesic: Secondary | ICD-10-CM

## 2019-07-26 DIAGNOSIS — Z7901 Long term (current) use of anticoagulants: Secondary | ICD-10-CM

## 2019-07-26 DIAGNOSIS — Z7289 Other problems related to lifestyle: Secondary | ICD-10-CM

## 2019-07-26 LAB — CBC WITH DIFFERENTIAL/PLATELET
Abs Immature Granulocytes: 0.06 10*3/uL (ref 0.00–0.07)
Basophils Absolute: 0 10*3/uL (ref 0.0–0.1)
Basophils Relative: 0 %
Eosinophils Absolute: 0 10*3/uL (ref 0.0–0.5)
Eosinophils Relative: 0 %
HCT: 32 % — ABNORMAL LOW (ref 36.0–46.0)
Hemoglobin: 10.5 g/dL — ABNORMAL LOW (ref 12.0–15.0)
Immature Granulocytes: 1 %
Lymphocytes Relative: 25 %
Lymphs Abs: 3 10*3/uL (ref 0.7–4.0)
MCH: 31.2 pg (ref 26.0–34.0)
MCHC: 32.8 g/dL (ref 30.0–36.0)
MCV: 95 fL (ref 80.0–100.0)
Monocytes Absolute: 1.2 10*3/uL — ABNORMAL HIGH (ref 0.1–1.0)
Monocytes Relative: 10 %
Neutro Abs: 7.6 10*3/uL (ref 1.7–7.7)
Neutrophils Relative %: 64 %
Platelets: 278 10*3/uL (ref 150–400)
RBC: 3.37 MIL/uL — ABNORMAL LOW (ref 3.87–5.11)
RDW: 15.4 % (ref 11.5–15.5)
WBC: 11.9 10*3/uL — ABNORMAL HIGH (ref 4.0–10.5)
nRBC: 0 % (ref 0.0–0.2)

## 2019-07-26 LAB — COMPREHENSIVE METABOLIC PANEL
ALT: 26 U/L (ref 0–44)
AST: 59 U/L — ABNORMAL HIGH (ref 15–41)
Albumin: 1.4 g/dL — ABNORMAL LOW (ref 3.5–5.0)
Alkaline Phosphatase: 105 U/L (ref 38–126)
Anion gap: 8 (ref 5–15)
BUN: 12 mg/dL (ref 6–20)
CO2: 28 mmol/L (ref 22–32)
Calcium: 7.5 mg/dL — ABNORMAL LOW (ref 8.9–10.3)
Chloride: 89 mmol/L — ABNORMAL LOW (ref 98–111)
Creatinine, Ser: 1.06 mg/dL — ABNORMAL HIGH (ref 0.44–1.00)
GFR calc Af Amer: >60 mL/min (ref >60)
GFR calc non Af Amer: 59 mL/min — ABNORMAL LOW (ref >60)
Glucose, Bld: 82 mg/dL (ref 70–99)
Potassium: 4.2 mmol/L (ref 3.5–5.1)
Sodium: 125 mmol/L — ABNORMAL LOW (ref 135–145)
Total Bilirubin: 2.7 mg/dL — ABNORMAL HIGH (ref 0.3–1.2)
Total Protein: 5.7 g/dL — ABNORMAL LOW (ref 6.5–8.1)

## 2019-07-26 LAB — PROCALCITONIN: Procalcitonin: 0.59 ng/mL

## 2019-07-26 MED ORDER — HEPARIN SODIUM (PORCINE) 5000 UNIT/ML IJ SOLN
5000.0000 [IU] | Freq: Three times a day (TID) | INTRAMUSCULAR | Status: DC
  Administered 2019-07-27: 5000 [IU] via SUBCUTANEOUS

## 2019-07-26 MED ORDER — SODIUM CHLORIDE 0.9 % IV BOLUS
500.0000 mL | Freq: Once | INTRAVENOUS | Status: AC
  Administered 2019-07-26: 500 mL via INTRAVENOUS

## 2019-07-26 MED ORDER — LACTULOSE 10 GM/15ML PO SOLN
30.0000 g | Freq: Three times a day (TID) | ORAL | Status: DC
  Filled 2019-07-26: qty 60

## 2019-07-26 MED ORDER — ONDANSETRON HCL 4 MG PO TABS: 4.0000 mg | ORAL_TABLET | Freq: Four times a day (QID) | ORAL | Status: DC | PRN

## 2019-07-26 MED ORDER — FUROSEMIDE 10 MG/ML IJ SOLN
60.0000 mg | Freq: Once | INTRAMUSCULAR | Status: DC
  Filled 2019-07-26: qty 8

## 2019-07-26 MED ORDER — SODIUM CHLORIDE 0.9 % IV SOLN
2.0000 g | Freq: Once | INTRAVENOUS | Status: AC
  Administered 2019-07-26: 2 g via INTRAVENOUS
  Filled 2019-07-26: qty 2

## 2019-07-26 MED ORDER — LACTULOSE 10 GM/15ML PO SOLN: 30.0000 g | Freq: Three times a day (TID) | ORAL | Status: DC

## 2019-07-26 MED ORDER — FUROSEMIDE 10 MG/ML IJ SOLN: 20.0000 mg | Freq: Once | INTRAMUSCULAR | Status: DC

## 2019-07-26 MED ORDER — OXYCODONE HCL 5 MG PO TABS: 5.0000 mg | ORAL_TABLET | ORAL | Status: DC | PRN

## 2019-07-26 MED ORDER — VANCOMYCIN HCL IN DEXTROSE 1-5 GM/200ML-% IV SOLN: 1000.0000 mg | Freq: Once | INTRAVENOUS | Status: DC

## 2019-07-26 MED ORDER — LACTULOSE 10 GM/15ML PO SOLN
20.0000 g | Freq: Once | ORAL | Status: AC
  Administered 2019-07-26: 20 g via ORAL
  Filled 2019-07-26: qty 30

## 2019-07-26 MED ORDER — VANCOMYCIN HCL IN DEXTROSE 1-5 GM/200ML-% IV SOLN
1000.0000 mg | Freq: Once | INTRAVENOUS | Status: AC
  Administered 2019-07-27: 1000 mg via INTRAVENOUS
  Filled 2019-07-26: qty 200

## 2019-07-26 MED ORDER — SODIUM CHLORIDE 0.9 % IV SOLN: INTRAVENOUS | Status: DC

## 2019-07-26 MED ORDER — ONDANSETRON HCL 4 MG/2ML IJ SOLN: 4.0000 mg | Freq: Four times a day (QID) | INTRAMUSCULAR | Status: DC | PRN

## 2019-07-26 MED ORDER — SODIUM CHLORIDE 0.9 % IV SOLN: 2.0000 g | Freq: Two times a day (BID) | INTRAVENOUS | Status: DC

## 2019-07-26 MED ORDER — SODIUM CHLORIDE 0.9 % IV BOLUS (SEPSIS): 500.0000 mL | Freq: Once | INTRAVENOUS | Status: DC

## 2019-07-26 NOTE — ED Notes (Signed)
Phlebotomy at bedside.

## 2019-07-26 NOTE — ED Provider Notes (Signed)
[facility] Emergency Department Provider Note    First MD Initiated Contact with Patient 07/26/19 2040     (approximate)  I have reviewed the triage vital signs and the nursing notes.   HISTORY  Chief Complaint Shortness of Breath  Level V Caveat:  AMS   HPI Abigail [name] is a 59 y.o. female   with an extensive past medical history as listed below presents to the ER via EMS due to confusion as well as shortness of breath and found to be hypoxic to the 70s on room air.  Patient does not wear home oxygen.  She is recently established with palliative care.  Hospice nurse came to evaluate patient and felt like her symptoms need to be managed in the ER.     Past Medical History:  Diagnosis Date  . Alcoholism ([place])   . Cholecystitis   . COVID-19 virus infection 03/2019  . GERD (gastroesophageal reflux disease)   . Headache    improved with gabapentin   . Hypertension   . Pancreatic insufficiency   . Pancreatitis 10/2015   2013 alcohol and/or gallstone  . Photosensitivity    (No formal dx of lupus) prev eval Dr. Jefm Bryant in [place]  . Portal vein thrombosis 2019   Family History  Problem Relation Age of Onset  . Heart disease Mother        CAD, angioplasty twice with stents  . Cancer Mother 34       Breast  . Breast cancer Mother 62  . Hypertension Mother   . Peripheral Artery Disease Mother   . Hypertension Father   . Hypothyroidism Father   . Heart disease Father        MI at age 47  . Cancer Father 22       H/O renal CA  . Diabetes Sister   . Cancer Sister 44       Renal CA  . Factor V Leiden deficiency Brother   . Clotting disorder Brother   . Colon cancer Neg Hx    Past Surgical History:  Procedure Laterality Date  . APPENDECTOMY     As a child  . CAT scan  11/00   wnl  . CHOLECYSTECTOMY  04/20/2012   Procedure: LAPAROSCOPIC CHOLECYSTECTOMY WITH INTRAOPERATIVE CHOLANGIOGRAM;  Surgeon: Merrie Roof, MD;  Location: [place];   Service: General;  Laterality: N/A;  . COLONOSCOPY  2005  . DOBUTAMINE STRESS ECHO  11/00   wnl  . DOPPLER ECHOCARDIOGRAPHY     Secondary to [place], wnl  . ESOPHAGOGASTRODUODENOSCOPY  8/01   Esophagitis [place] [facility])  . FOOT SURGERY  04/2015   Pin in small toe  . 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 NSVD     X 2  . SBFT  08/01   wnl  . TONSILLECTOMY     As a child  . TUBAL LIGATION  Late 20's   Patient Active Problem List   Diagnosis Date Noted  . Acute respiratory failure with hypoxia ([place]) 07/26/2019  . Multifocal pneumonia 07/26/2019  . Anasarca 07/26/2019  . History of COVID-19 07/26/2019  . Protein-calorie malnutrition, severe 06/28/2019  . Hyponatremia   . Alcoholic cirrhosis of liver without ascites ([place])   . Hypoalbuminemia   . Acute hepatic encephalopathy 06/26/2019  . SOB (shortness of breath) 06/11/2019  . Electrolyte abnormality 06/08/2019  . Pancreatic insufficiency 05/22/2019  . Acute foot pain, right 05/19/2019  . Edema 05/14/2019  . Headache   .  Lower abdominal pain 08/11/2018  . Colon cancer screening 11/24/2017  . Right knee pain 06/30/2017  . Neck strain 06/17/2017  . Portal vein thrombosis 05/27/2017  . Severe sepsis (H[facility]) 05/13/2017  . History of acute pancreatitis 10/26/2015  . Other migraine without status migrainosus, not intractable 09/17/2015  . Sciatica 10/10/2014  . Routine general medical examination at a health care facility 06/05/2014  . Advance care planning 06/05/2014  . Pain in joint, shoulder region 07/19/2013  . History of alcohol abuse 09/13/2012  . Anxiety 04/10/2012  . B12 deficiency 01/31/2010  . Pain in joint 01/31/2010  . Essential hypertension 08/16/2006  . ASTHMA, EXTRINSIC NOS 08/16/2006  . ESOPHAGITIS 08/16/2006  . GERD 08/16/2006      Prior to Admission medications   Medication Sig Start Date End Date Taking? Authorizing Provider  albuterol (PROVENTIL HFA;VENTOLIN HFA) 108 (90 Base) MCG/ACT inhaler  Inhale 2 puffs into the lungs every 4 (four) hours as needed for wheezing or shortness of breath. 06/20/18   Doreene Nestlark, Katherine K, NP  apixaban (ELIQUIS) 5 MG TABS tablet Take 1 tablet (5 mg total) by mouth 2 (two) times daily. 07/05/19   Joaquim Namuncan, Graham S, MD  calcium-vitamin D (OSCAL WITH D) 500-200 MG-UNIT tablet Take 1 tablet by mouth 2 (two) times daily. 06/29/19   Enedina FinnerPatel, Sona, MD  cephALEXin (KEFLEX) 500 MG capsule Take 1 capsule (500 mg total) by mouth 3 (three) times daily. 07/18/19   Joaquim Namuncan, Graham S, MD  CREON 36000 units CPEP capsule TAKE ONE CAPSULE BY MOUTH THREE TIMES A DAY BEFORE MEALS 07/26/19   Joaquim Namuncan, Graham S, MD  feeding supplement, ENSURE ENLIVE, (ENSURE ENLIVE) LIQD Take 237 mLs by mouth 3 (three) times daily between meals. 06/29/19   Enedina FinnerPatel, Sona, MD  folic acid (FOLVITE) 1 MG tablet Take 1 tablet (1 mg total) by mouth daily. 07/10/19   Joaquim Namuncan, Graham S, MD  furosemide (LASIX) 80 MG tablet Take 1 tablet (80 mg total) by mouth 2 (two) times daily as needed for fluid. 07/18/19   Joaquim Namuncan, Graham S, MD  gabapentin (NEURONTIN) 100 MG capsule Take 1 capsule (100 mg total) by mouth 2 (two) times daily as needed (hold if drowsy, start with 1 tab a day.). 07/10/19   Joaquim Namuncan, Graham S, MD  Insulin Syringe-Needle U-100 (B-D INSULIN SYRINGE 1CC/25GX1") 25G X 1" 1 ML MISC Inject 1 Syringe into the muscle every 14 (fourteen) days. For B12 injection 11/16/18   Joaquim Namuncan, Graham S, MD  lactulose (CHRONULAC) 10 GM/15ML solution Take 45 mLs (30 g total) by mouth 3 (three) times daily. 07/10/19   Joaquim Namuncan, Graham S, MD  midodrine (PROAMATINE) 5 MG tablet Take 1 tablet (5 mg total) by mouth 3 (three) times daily with meals. 07/10/19   Joaquim Namuncan, Graham S, MD  Multiple Vitamin (MULTIVITAMIN WITH MINERALS) TABS tablet Take 1 tablet by mouth daily. 06/30/19   Enedina FinnerPatel, Sona, MD  ondansetron (ZOFRAN ODT) 4 MG disintegrating tablet Take 1 tablet (4 mg total) by mouth every 8 (eight) hours as needed for nausea or vomiting. 07/10/19   Joaquim Namuncan,  Graham S, MD  oxyCODONE (ROXICODONE) 5 MG immediate release tablet Take 0.5-1 tablets (2.5-5 mg total) by mouth every 6 (six) hours as needed for breakthrough pain (hospice patient.). 07/25/19   Joaquim Namuncan, Graham S, MD  pantoprazole (PROTONIX) 40 MG tablet Take 1 tablet (40 mg total) by mouth daily. 06/11/19   Burnadette PopAdhikari, Amrit, MD  potassium chloride SA (KLOR-CON) 20 MEQ tablet Take 1 tab by mouth with each dose  of lasix (up to 2 doses per day) 07/18/19   Joaquim Nam, MD  thiamine 100 MG tablet Take 1 tablet (100 mg total) by mouth daily. 06/11/19   Burnadette Pop, MD  traMADol (ULTRAM) 50 MG tablet TAKE ONE TABLET BY MOUTH EVERY 8 HOUR AS NEEDED FOR PAIN Patient taking differently: Take 50 mg by mouth every 8 (eight) hours as needed for moderate pain.  03/24/19   Joaquim Nam, MD  venlafaxine (EFFEXOR) 75 MG tablet TAKE ONE TABLET BY MOUTH TWICE A DAY Patient taking differently: Take 75 mg by mouth 2 (two) times daily.  03/27/19   Joaquim Nam, MD  Vitamin D, Ergocalciferol, (DRISDOL) 1.25 MG (50000 UNIT) CAPS capsule Take 1 capsule (50,000 Units total) by mouth every 7 (seven) days. 07/10/19   Joaquim Nam, MD    Allergies Latex, Atorvastatin, Dilaudid [hydromorphone hcl], Lexapro [escitalopram oxalate], Lisinopril, Mirtazapine, Nsaids, and Tylenol [acetaminophen]    Social History Social History   Tobacco Use  . Smoking status: Never Smoker  . Smokeless tobacco: Never Used  Substance Use Topics  . Alcohol use: Yes    Alcohol/week: 3.0 standard drinks    Types: 2 Glasses of wine, 1 Cans of beer per week    Comment: cvouple glsses of wine over weekdend and beer monday   . Drug use: No    Review of Systems Patient denies headaches, rhinorrhea, blurry vision, numbness, shortness of breath, chest pain, edema, cough, abdominal pain, nausea, vomiting, diarrhea, dysuria, fevers, rashes or hallucinations unless otherwise stated above in  HPI. ____________________________________________   PHYSICAL EXAM:  VITAL SIGNS: Vitals:   07/26/19 2339 07/26/19 2345  BP:  (!) 87/65  Pulse: 93 93  Resp: (!) 25 (!) 29  Temp:    SpO2: 94% 93%    Constitutional: Alert chronically ill appearing Eyes: Conjunctivae are normal.  Head: Atraumatic. Nose: No congestion/rhinnorhea. Mouth/Throat: Mucous membranes are moist.   Neck: No stridor. Painless ROM.  Cardiovascular: Normal rate, regular rhythm. Grossly normal heart sounds.  Good peripheral circulation. Respiratory: tachypnea with diminished posterior lung sounds Gastrointestinal: Soft and nontender. No distention. No abdominal bruits. No CVA tenderness. Genitourinary: deferred Musculoskeletal: No lower extremity tenderness, 2+ BLE edema.  No joint effusions. Neurologic:  Normal speech and language. No gross focal neurologic deficits are appreciated. No facial droop Skin:  Skin is warm, dry and intact. No rash noted. Psychiatric: Mood and affect are normal. Speech and behavior are normal.  ____________________________________________   LABS (all labs ordered are listed, but only abnormal results are displayed)  Results for orders placed or performed during the hospital encounter of 07/26/19 (from the past 24 hour(s))  CBC with Differential/Platelet     Status: Abnormal   Collection Time: 07/26/19  9:48 PM  Result Value Ref Range   WBC 11.9 (H) 4.0 - 10.5 K/uL   RBC 3.37 (L) 3.87 - 5.11 MIL/uL   Hemoglobin 10.5 (L) 12.0 - 15.0 g/dL   HCT 95.0 (L) 93.2 - 67.1 %   MCV 95.0 80.0 - 100.0 fL   MCH 31.2 26.0 - 34.0 pg   MCHC 32.8 30.0 - 36.0 g/dL   RDW 24.5 80.9 - 98.3 %   Platelets 278 150 - 400 K/uL   nRBC 0.0 0.0 - 0.2 %   Neutrophils Relative % 64 %   Neutro Abs 7.6 1.7 - 7.7 K/uL   Lymphocytes Relative 25 %   Lymphs Abs 3.0 0.7 - 4.0 K/uL   Monocytes Relative 10 %  Monocytes Absolute 1.2 (H) 0.1 - 1.0 K/uL   Eosinophils Relative 0 %   Eosinophils Absolute 0.0  0.0 - 0.5 K/uL   Basophils Relative 0 %   Basophils Absolute 0.0 0.0 - 0.1 K/uL   Immature Granulocytes 1 %   Abs Immature Granulocytes 0.06 0.00 - 0.07 K/uL  Comprehensive metabolic panel     Status: Abnormal   Collection Time: 07/26/19  9:48 PM  Result Value Ref Range   Sodium 125 (L) 135 - 145 mmol/L   Potassium 4.2 3.5 - 5.1 mmol/L   Chloride 89 (L) 98 - 111 mmol/L   CO2 28 22 - 32 mmol/L   Glucose, Bld 82 70 - 99 mg/dL   BUN 12 6 - 20 mg/dL   Creatinine, Ser 7.82 (H) 0.44 - 1.00 mg/dL   Calcium 7.5 (L) 8.9 - 10.3 mg/dL   Total Protein 5.7 (L) 6.5 - 8.1 g/dL   Albumin 1.4 (L) 3.5 - 5.0 g/dL   AST 59 (H) 15 - 41 U/L   ALT 26 0 - 44 U/L   Alkaline Phosphatase 105 38 - 126 U/L   Total Bilirubin 2.7 (H) 0.3 - 1.2 mg/dL   GFR calc non Af Amer 59 (L) >60 mL/min   GFR calc Af Amer >60 >60 mL/min   Anion gap 8 5 - 15  Procalcitonin     Status: None   Collection Time: 07/26/19  9:48 PM  Result Value Ref Range   Procalcitonin 0.59 ng/mL   ____________________________________________  EKG My review and personal interpretation at Time: 20:23   Indication: sob  Rate: 100  Rhythm: sinus Axis: normal Other: normal intervals, no stemi ____________________________________________  RADIOLOGY  I personally reviewed all radiographic images ordered to evaluate for the above acute complaints and reviewed radiology reports and findings.  These findings were personally discussed with the patient.  Please see medical record for radiology report.  ____________________________________________   PROCEDURES  Procedure(s) performed:  .Critical Care Performed by: Willy Eddy, MD Authorized by: Willy Eddy, MD   Critical care provider statement:    Critical care time (minutes):  35   Critical care time was exclusive of:  Separately billable procedures and treating other patients   Critical care was necessary to treat or prevent imminent or life-threatening deterioration of the  following conditions:  Respiratory failure   Critical care was time spent personally by me on the following activities:  Development of treatment plan with patient or surrogate, discussions with consultants, evaluation of patient's response to treatment, examination of patient, obtaining history from patient or surrogate, ordering and performing treatments and interventions, ordering and review of laboratory studies, ordering and review of radiographic studies, pulse oximetry, re-evaluation of patient's condition and review of old charts      Critical Care performed: yes ____________________________________________   INITIAL IMPRESSION / ASSESSMENT AND PLAN / ED COURSE  Pertinent labs & imaging results that were available during my care of the patient were reviewed by me and considered in my medical decision making (see chart for details).   DDX: Dehydration, sepsis, pna, uti, hypoglycemia, cva, drug effect, withdrawal, encephalitis   TEARRA OUK is a 59 y.o. who presents to the ED with symptoms as described above.  Patient is chronically ill with end-stage liver disease presenting with acute respiratory failure requiring supplemental oxygen.  Does appear anasarca.  Blood will be sent for by differential.  Patient is a very difficult stick.  She not febrile.  Will evaluate for by  differential.  Clinical Course as of Jul 26 2303  Wed Jul 26, 2019  2222 Patient with hyponatremia does appear to have a having worsening anasarca liver failure.  Does have elevated white count given her hypoxia tachypnea and low blood pressure will cover for antibiotics as she was recently on Keflex.  Possible pneumonia.  States that she had some vomiting episodes.  Fairly difficult situation as there is concern for infection possible sepsis given low blood pressure but this also may be secondary to her underlying cirrhosis.  Will give gentle IV hydration but do not want to give full 30 cc/kg bolus due to her  anasarca as this would potentially worsen her respiratory status and she is DNR.  Also hesitant to aggressively diurese if this is more reflective of chf and edema  given her hyponatremia and soft blood pressures at this time.  We will give her something for pain.  It sounds like she wants to go to hospice.  We will proceed with treatment for possible infection she appears comfortable at this time but her prognosis is very poor.  Will discuss with hospitalist for admission.  [PR]    Clinical Course User Index [PR] Willy Eddy, MD    The patient was evaluated in Emergency Department today for the symptoms described in the history of present illness. He/she was evaluated in the context of the global COVID-19 pandemic, which necessitated consideration that the patient might be at risk for infection with the SARS-CoV-2 virus that causes COVID-19. Institutional protocols and algorithms that pertain to the evaluation of patients at risk for COVID-19 are in a state of rapid change based on information released by regulatory bodies including the CDC and federal and state organizations. These policies and algorithms were followed during the patient's care in the ED.  As part of my medical decision making, I reviewed the following data within the electronic MEDICAL RECORD NUMBER Nursing notes reviewed and incorporated, Labs reviewed, notes from prior ED visits and [place] Controlled Substance Database   ____________________________________________   FINAL CLINICAL IMPRESSION(S) / ED DIAGNOSES  Final diagnoses:  Acute respiratory failure with hypoxia ([facility])  Alcoholic cirrhosis, unspecified whether ascites present ([facility])  Anasarca      NEW MEDICATIONS STARTED DURING THIS VISIT:  New Prescriptions   No medications on file     Note:  This document was prepared using Dragon voice recognition software and may include unintentional dictation errors.    Willy Eddy, MD 07/27/19 [phone]

## 2019-07-26 NOTE — Telephone Encounter (Signed)
Sent. Thanks.   

## 2019-07-26 NOTE — ED Notes (Signed)
Pt brought in by EMS due to low oxygen saturation. Pt states abdominal pain when eating and drinking and thinks she is dehydrated, as she stated, she is not drinking a lot

## 2019-07-26 NOTE — H&P (Signed)
History and Physical    Abigail [name] UQJ:[id number] DOB: 1960/05/02 DOA: 07/26/2019  PCP: Tonia Ghent, MD   Patient coming from: Home  I have personally briefly reviewed patient's old medical records in [place]  Chief Complaint: Altered mental status  HPI: Abigail [name] is a 59 y.o. female with medical history significant for alcoholism, alcoholic liver disease,gastric bypass surgery 2005at [facility], hypertension, portal vein thrombosis 2019, GERD,chronicpancreatitis, portal vein thrombosis on Eliquis and previous history of COVID-19in March 08, 2019, hospitalized from 06/26/2019 to 06/29/2019 with hepatic encephalopathy, discharged to hospice, who presents to the emergency room after she was found to have altered mental status by the hospice nurse.  Most of the history is taken from the ER record as patient is very frail and lethargic.  EMS reported O2 sat of 70% on room air and she arrived on nonrebreather.  ED Course: By arrival in the emergency room O2 sat was 100% on nonrebreather.  She was hypotensive at 95/64 going as low as 84/60 but maintaining a map between 65 and 70.  She was afebrile.  Blood work significant for WBC of 12,000, sodium 125, creatinine 1.06 up from baseline of 0.84.  AST and ALT mildly elevated, with alk phos 105 and bilirubin 2.7 the time of hospitalist consult, lactic acid and ammonia levels are pending.  Covid test also pending .procalcitonin elevated at 0.59.  Chest x-ray showed multifocal pneumonia versus edema.  Patient started on sepsis protocol for possible HCAP, to fluid bolus not initiated due to anasarca and appearance of fluid overload  Review of Systems: Difficult to obtain due to lethargy  Past Medical History:  Diagnosis Date  . Alcoholism ([place])   . Cholecystitis   . COVID-19 virus infection 03/2019  . GERD (gastroesophageal reflux disease)   . Headache    improved with gabapentin   . Hypertension   . Pancreatic insufficiency   .  Pancreatitis 10/2015   2013 alcohol and/or gallstone  . Photosensitivity    (No formal dx of lupus) prev eval Dr. Jefm Bryant in [place]  . Portal vein thrombosis 2019    Past Surgical History:  Procedure Laterality Date  . APPENDECTOMY     As a child  . CAT scan  11/00   wnl  . CHOLECYSTECTOMY  04/20/2012   Procedure: LAPAROSCOPIC CHOLECYSTECTOMY WITH INTRAOPERATIVE CHOLANGIOGRAM;  Surgeon: Merrie Roof, MD;  Location: [facility];  Service: General;  Laterality: N/A;  . COLONOSCOPY  2005  . DOBUTAMINE STRESS ECHO  11/00   wnl  . DOPPLER ECHOCARDIOGRAPHY     Secondary to [place], wnl  . ESOPHAGOGASTRODUODENOSCOPY  8/01   Esophagitis [facility])  . FOOT SURGERY  04/2015   Pin in small toe  . 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 NSVD     X 2  . SBFT  08/01   wnl  . TONSILLECTOMY     As a child  . TUBAL LIGATION  Late 20's     reports that she has never smoked. She has never used smokeless tobacco. She reports current alcohol use of about 3.0 standard drinks of alcohol per week. She reports that she does not use drugs.  Allergies  Allergen Reactions  . Latex Rash    "hands turn beat red and start to itch"  . Atorvastatin Other (See Comments)    Swollen joints  . Dilaudid [Hydromorphone Hcl] Itching and Other (See Comments)    Itching- no rash- likely side effect but  not true allergy.  Tolerates oxycodone.   Loma Sousa [Escitalopram Oxalate] Other (See Comments)    Psychological changes  . Lisinopril Other (See Comments)    Hives, presumed allergy  . Mirtazapine Swelling    swelling  . Nsaids     Gastritis 2013  . Tylenol [Acetaminophen] Other (See Comments)    Hx pancreatitis    Family History  Problem Relation Age of Onset  . Heart disease Mother        CAD, angioplasty twice with stents  . Cancer Mother 19       Breast  . Breast cancer Mother 59  . Hypertension Mother   . Peripheral Artery Disease Mother   . Hypertension Father   . Hypothyroidism  Father   . Heart disease Father        MI at age 63  . Cancer Father 65       H/O renal CA  . Diabetes Sister   . Cancer Sister 22       Renal CA  . Factor V Leiden deficiency Brother   . Clotting disorder Brother   . Colon cancer Neg Hx      Prior to Admission medications   Medication Sig Start Date End Date Taking? Authorizing Provider  albuterol (PROVENTIL HFA;VENTOLIN HFA) 108 (90 Base) MCG/ACT inhaler Inhale 2 puffs into the lungs every 4 (four) hours as needed for wheezing or shortness of breath. 06/20/18   Pleas Koch, NP  apixaban (ELIQUIS) 5 MG TABS tablet Take 1 tablet (5 mg total) by mouth 2 (two) times daily. 07/05/19   Tonia Ghent, MD  calcium-vitamin D (OSCAL WITH D) 500-200 MG-UNIT tablet Take 1 tablet by mouth 2 (two) times daily. 06/29/19   Fritzi Mandes, MD  cephALEXin (KEFLEX) 500 MG capsule Take 1 capsule (500 mg total) by mouth 3 (three) times daily. 07/18/19   Tonia Ghent, MD  CREON 36000 units CPEP capsule TAKE ONE CAPSULE BY MOUTH THREE TIMES A DAY BEFORE MEALS 07/26/19   Tonia Ghent, MD  feeding supplement, ENSURE ENLIVE, (ENSURE ENLIVE) LIQD Take 237 mLs by mouth 3 (three) times daily between meals. 06/29/19   Fritzi Mandes, MD  folic acid (FOLVITE) 1 MG tablet Take 1 tablet (1 mg total) by mouth daily. 07/10/19   Tonia Ghent, MD  furosemide (LASIX) 80 MG tablet Take 1 tablet (80 mg total) by mouth 2 (two) times daily as needed for fluid. 07/18/19   Tonia Ghent, MD  gabapentin (NEURONTIN) 100 MG capsule Take 1 capsule (100 mg total) by mouth 2 (two) times daily as needed (hold if drowsy, start with 1 tab a day.). 07/10/19   Tonia Ghent, MD  Insulin Syringe-Needle U-100 (B-D INSULIN SYRINGE 1CC/25GX1") 25G X 1" 1 ML MISC Inject 1 Syringe into the muscle every 14 (fourteen) days. For B12 injection 11/16/18   Tonia Ghent, MD  lactulose (CHRONULAC) 10 GM/15ML solution Take 45 mLs (30 g total) by mouth 3 (three) times daily. 07/10/19   Tonia Ghent, MD  midodrine (PROAMATINE) 5 MG tablet Take 1 tablet (5 mg total) by mouth 3 (three) times daily with meals. 07/10/19   Tonia Ghent, MD  Multiple Vitamin (MULTIVITAMIN WITH MINERALS) TABS tablet Take 1 tablet by mouth daily. 06/30/19   Fritzi Mandes, MD  ondansetron (ZOFRAN ODT) 4 MG disintegrating tablet Take 1 tablet (4 mg total) by mouth every 8 (eight) hours as needed for nausea or vomiting. 07/10/19  Tonia Ghent, MD  oxyCODONE (ROXICODONE) 5 MG immediate release tablet Take 0.5-1 tablets (2.5-5 mg total) by mouth every 6 (six) hours as needed for breakthrough pain (hospice patient.). 07/25/19   Tonia Ghent, MD  pantoprazole (PROTONIX) 40 MG tablet Take 1 tablet (40 mg total) by mouth daily. 06/11/19   Shelly Coss, MD  potassium chloride SA (KLOR-CON) 20 MEQ tablet Take 1 tab by mouth with each dose of lasix (up to 2 doses per day) 07/18/19   Tonia Ghent, MD  thiamine 100 MG tablet Take 1 tablet (100 mg total) by mouth daily. 06/11/19   Shelly Coss, MD  traMADol (ULTRAM) 50 MG tablet TAKE ONE TABLET BY MOUTH EVERY 8 HOUR AS NEEDED FOR PAIN Patient taking differently: Take 50 mg by mouth every 8 (eight) hours as needed for moderate pain.  03/24/19   Tonia Ghent, MD  venlafaxine (EFFEXOR) 75 MG tablet TAKE ONE TABLET BY MOUTH TWICE A DAY Patient taking differently: Take 75 mg by mouth 2 (two) times daily.  03/27/19   Tonia Ghent, MD  Vitamin D, Ergocalciferol, (DRISDOL) 1.25 MG (50000 UNIT) CAPS capsule Take 1 capsule (50,000 Units total) by mouth every 7 (seven) days. 07/10/19   Tonia Ghent, MD    Physical Exam: Vitals:   07/26/19 2100 07/26/19 2115 07/26/19 2145 07/26/19 2215  BP: 90/67 (!) 89/74 (!) 84/60 (!) 89/61  Pulse: 99 96 98 95  Resp: (!) 27 (!) 25 (!) 28 (!) 27  Temp:      TempSrc:      SpO2: (!) 89% 90% 96% 95%  Weight:      Height:         Vitals:   07/26/19 2100 07/26/19 2115 07/26/19 2145 07/26/19 2215  BP: 90/67 (!) 89/74 (!)  84/60 (!) 89/61  Pulse: 99 96 98 95  Resp: (!) 27 (!) 25 (!) 28 (!) 27  Temp:      TempSrc:      SpO2: (!) 89% 90% 96% 95%  Weight:      Height:        Constitutional: Lethargic but arousable and oriented x3., Eyes: PERLA, EOMI, irises appear normal, icteric sclera,  ENMT: external ears and nose appear normal, normal hearing  Neck: neck appears normal, no masses, normal ROM, no thyromegaly, no JVD  CVS: S1-S2 clear, no murmur rubs or gallops,  , no carotid bruits, pedal pulses palpable, 4+ pitting edema Respiratory: Decreased air entry bilaterally, no wheezing, rales or rhonchi. Respiratory effort increased. No accessory muscle use.  Abdomen: Soft nontender with mild distention, normal bowel sounds, no hepatosplenomegaly, Musculoskeletal: : no cyanosis, clubbing , no contractures or atrophy Neuro: Grossly intact, moving all extremities equally Psych: judgement and insight appear normal, stable mood and affect,  Skin: no rashes or lesions or ulcers, no induration or nodules   Labs on Admission: I have personally reviewed following labs and imaging studies  CBC: Recent Labs  Lab 07/26/19 2148  WBC 11.9*  NEUTROABS 7.6  HGB 10.5*  HCT 32.0*  MCV 95.0  PLT 193   Basic Metabolic Panel: Recent Labs  Lab 07/26/19 2148  NA 125*  K 4.2  CL 89*  CO2 28  GLUCOSE 82  BUN 12  CREATININE 1.06*  CALCIUM 7.5*   GFR: Estimated Creatinine Clearance: 42.5 mL/min (A) (by C-G formula based on SCr of 1.06 mg/dL (H)). Liver Function Tests: Recent Labs  Lab 07/26/19 2148  AST 59*  ALT 26  ALKPHOS 105  BILITOT 2.7*  PROT 5.7*  ALBUMIN 1.4*   No results for input(s): LIPASE, AMYLASE in the last 168 hours. No results for input(s): AMMONIA in the last 168 hours. Coagulation Profile: No results for input(s): INR, PROTIME in the last 168 hours. Cardiac Enzymes: No results for input(s): CKTOTAL, CKMB, CKMBINDEX, TROPONINI in the last 168 hours. BNP (last 3 results) Recent Labs     05/12/19 0935  PROBNP 203.0*   HbA1C: No results for input(s): HGBA1C in the last 72 hours. CBG: No results for input(s): GLUCAP in the last 168 hours. Lipid Profile: No results for input(s): CHOL, HDL, LDLCALC, TRIG, CHOLHDL, LDLDIRECT in the last 72 hours. Thyroid Function Tests: No results for input(s): TSH, T4TOTAL, FREET4, T3FREE, THYROIDAB in the last 72 hours. Anemia Panel: No results for input(s): VITAMINB12, FOLATE, FERRITIN, TIBC, IRON, RETICCTPCT in the last 72 hours. Urine analysis:    Component Value Date/Time   COLORURINE AMBER (A) 06/27/2019 0500   APPEARANCEUR HAZY (A) 06/27/2019 0500   APPEARANCEUR Clear 06/16/2013 1857   LABSPEC 1.013 06/27/2019 0500   LABSPEC 1.024 06/16/2013 1857   PHURINE 6.0 06/27/2019 0500   GLUCOSEU NEGATIVE 06/27/2019 0500   GLUCOSEU Negative 06/16/2013 1857   HGBUR LARGE (A) 06/27/2019 0500   BILIRUBINUR NEGATIVE 06/27/2019 0500   BILIRUBINUR Neg 05/12/2019 0939   BILIRUBINUR Negative 06/16/2013 1857   KETONESUR 5 (A) 06/27/2019 0500   PROTEINUR 30 (A) 06/27/2019 0500   UROBILINOGEN 0.2 05/12/2019 0939   UROBILINOGEN 2.0 (H) 04/19/2012 1205   NITRITE NEGATIVE 06/27/2019 0500   LEUKOCYTESUR NEGATIVE 06/27/2019 0500   LEUKOCYTESUR Negative 06/16/2013 1857    Radiological Exams on Admission: DG Chest Portable 1 View  Result Date: 07/26/2019 CLINICAL DATA:  59 year old female with shortness of breath. EXAM: PORTABLE CHEST 1 VIEW COMPARISON:  Chest radiograph dated 05/12/2019. FINDINGS: Shallow inspiration. Diffuse bilateral interstitial and airspace confluent and streaky densities may represent multifocal pneumonia versus edema. Clinical correlation is recommended. There is no pleural effusion or pneumothorax. The cardiac silhouette is within normal limits. No acute osseous pathology. IMPRESSION: Multifocal pneumonia versus edema. Clinical correlation and follow-up recommended. Electronically Signed   By: Anner Crete M.D.   On:  07/26/2019 21:14    EKG: Independently reviewed.   Assessment/Plan Principal Problem:   1. Severe sepsis ([place])   2. Acute respiratory failure with hypoxia (HCC)   3. Multifocal pneumonia/HCAP   4. Acute kidney injury   5. Acute metabolic encephalopathy -Patient meeting severe sepsis criteria with tachycardia, tachypnea, hypoxia, multiorgan derangement including acute kidney injury, acute metabolic encephalopathy infection and leukocytosis, follow lactic acid level -Patient has anasarca associated with alcoholic liver disease so sepsis fluid bolus not administered -Serial boluses to maintain MAP over 65.  Starting with normal saline 500 -Follow Covid test though patient has history of prior Covid.  Has not received the vaccine -Calcitonin elevated at 0.59 so suspecting bacterial infection -IV cefepime and vancomycin to treat hospital-acquired pneumonia -Patient is on hospice with poor prognosis.  Discussed with patient who elects to be DNR    Acute hepatic encephalopathy   Alcoholic cirrhosis of liver with ascites ([place])   Anasarca -Suspect acute hepatic encephalopathy contributing to her lethargy -Follow-up ammonia, still pending from the emergency room -CIWA lactulose in the ER -Continue lactulose 30 g 3 times daily.    History of COVID-19 -Unlikely repeat Covid infection but not ruled out.  Patient is not vaccinated -Follow Covid PCR  History of portal vein thrombosis in 2019  Factor V Leiden disease -Follows with hematologist, Dr. Rogue Bussing -Prophylactic versus therapeutic Lovenox to replace Eliquis while altered -Follow INR to evaluate for coagulopathy.  Platelet count was within normal limits    DVT prophylaxis: lovenox Code Status: DNR  Family Communication:  none  Disposition Plan: Back to previous home environment Consults called: none  Status:inp    Athena Masse MD [facility]     07/26/2019, 11:30 PM

## 2019-07-26 NOTE — ED Triage Notes (Signed)
Pt arrived to ED from home via EMS after she was found by her neighbor altered and sob. Pt O2 saturation was 70% on room air. Pt is currently a  hospice patient with liver CA. Once placed on NRB pt maintained 100% O2. More alert at this time. C/o generalized abd pain which is her baseline. Lower extremity swelling noted at this time.

## 2019-07-26 NOTE — ED Notes (Signed)
Order placed for IV consult.

## 2019-07-26 NOTE — ED Notes (Signed)
ER provider notified of BP. Nurse and provider talked and determined to hold lasix at this time.

## 2019-07-26 NOTE — ED Notes (Signed)
 of NS started. As per ER provider, give of NS and then reassess if another of NS needed, to make the .

## 2019-07-26 NOTE — Progress Notes (Signed)
CODE SEPSIS - PHARMACY COMMUNICATION  **Broad Spectrum Antibiotics should be administered within 1 hour of Sepsis diagnosis**  Time Code Sepsis Called/Page Received: 2324  Antibiotics Ordered: cefepime/vanc  Time of 1st antibiotic administration: 2329  Additional action taken by pharmacy:   If necessary, Name of Provider/Nurse Contacted:     Thomasene Ripple ,PharmD Clinical Pharmacist  07/26/2019  11:49 PM

## 2019-07-26 NOTE — ED Notes (Signed)
Provider notified that first of NS bolus was completed and that he ordered for of NS. Provider notified BP of 96/69. ER provider stated to finish the bolus over 2 hours. IVP set to infuse of NS at 140mL/hr

## 2019-07-26 NOTE — Progress Notes (Signed)
PHARMACY -  BRIEF ANTIBIOTIC NOTE   Pharmacy has received consult(s) for cefepime/vanc from an ED provider.  The patient's profile has been reviewed for ht/wt/allergies/indication/available labs.    One time order(s) placed for cefepime/vancomycin  Further antibiotics/pharmacy consults should be ordered by admitting physician if indicated.                       Thank you,  Thomasene Ripple, PharmD, BCPS Clinical Pharmacist 07/26/2019  10:23 PM

## 2019-07-26 NOTE — ED Notes (Signed)
Lab called and stated they are going to send someone to try and get needed blood work.

## 2019-07-26 NOTE — ED Notes (Signed)
Unable to obtain enough blood for blood cultures. ER provider is aware. IV team request placed and another RN called and asked lab if a phlebotomist is able to come. IVF started.

## 2019-07-27 ENCOUNTER — Inpatient Hospital Stay: Payer: 59

## 2019-07-27 ENCOUNTER — Encounter: Payer: Self-pay | Admitting: Internal Medicine

## 2019-07-27 DIAGNOSIS — A419 Sepsis, unspecified organism: Principal | ICD-10-CM

## 2019-07-27 DIAGNOSIS — R6520 Severe sepsis without septic shock: Secondary | ICD-10-CM

## 2019-07-27 DIAGNOSIS — K7200 Acute and subacute hepatic failure without coma: Secondary | ICD-10-CM

## 2019-07-27 DIAGNOSIS — J9601 Acute respiratory failure with hypoxia: Secondary | ICD-10-CM

## 2019-07-27 LAB — BLOOD CULTURE ID PANEL (REFLEXED)

## 2019-07-27 LAB — CORTISOL-AM, BLOOD: Cortisol - AM: 19.4 ug/dL (ref 6.7–22.6)

## 2019-07-27 LAB — PROTIME-INR
INR: 3.3 — ABNORMAL HIGH (ref 0.8–1.2)
INR: 3.3 — ABNORMAL HIGH (ref 0.8–1.2)
Prothrombin Time: 33.7 seconds — ABNORMAL HIGH (ref 11.4–15.2)
Prothrombin Time: 33.3 seconds — ABNORMAL HIGH (ref 11.4–15.2)

## 2019-07-27 LAB — BRAIN NATRIURETIC PEPTIDE: B Natriuretic Peptide: 105 pg/mL — ABNORMAL HIGH (ref 0.0–100.0)

## 2019-07-27 LAB — LACTIC ACID, PLASMA
Lactic Acid, Venous: 2.4 mmol/L (ref 0.5–1.9)
Lactic Acid, Venous: 1.8 mmol/L (ref 0.5–1.9)

## 2019-07-27 LAB — RESPIRATORY PANEL BY RT PCR (FLU A&B, COVID)
Influenza A by PCR: NEGATIVE
Influenza B by PCR: NEGATIVE
SARS Coronavirus 2 by RT PCR: NEGATIVE

## 2019-07-27 LAB — MAGNESIUM: Magnesium: 1.5 mg/dL — ABNORMAL LOW (ref 1.7–2.4)

## 2019-07-27 LAB — PROCALCITONIN: Procalcitonin: 0.44 ng/mL

## 2019-07-27 LAB — AMMONIA: Ammonia: 53 umol/L — ABNORMAL HIGH (ref 9–35)

## 2019-07-27 MED ORDER — FUROSEMIDE 10 MG/ML IJ SOLN
40.0000 mg | Freq: Once | INTRAMUSCULAR | Status: AC
  Administered 2019-07-27: 40 mg via INTRAVENOUS
  Filled 2019-07-27: qty 4

## 2019-07-27 MED ORDER — FOLIC ACID 1 MG PO TABS: 1.0000 mg | ORAL_TABLET | Freq: Every day | ORAL | Status: DC

## 2019-07-27 MED ORDER — MIDODRINE HCL 5 MG PO TABS
5.0000 mg | ORAL_TABLET | Freq: Three times a day (TID) | ORAL | Status: DC
  Administered 2019-07-27: 5 mg via ORAL
  Filled 2019-07-27 (×3): qty 1

## 2019-07-27 MED ORDER — THIAMINE HCL 100 MG PO TABS: 100.0000 mg | ORAL_TABLET | Freq: Every day | ORAL | Status: DC

## 2019-07-27 MED ORDER — IOHEXOL 350 MG/ML SOLN
75.0000 mL | Freq: Once | INTRAVENOUS | Status: AC | PRN
  Administered 2019-07-27: 75 mL via INTRAVENOUS

## 2019-07-27 MED ORDER — ENSURE ENLIVE PO LIQD: 237.0000 mL | Freq: Three times a day (TID) | ORAL | Status: DC

## 2019-07-27 MED ORDER — VANCOMYCIN HCL IN DEXTROSE 1-5 GM/200ML-% IV SOLN: 1000.0000 mg | INTRAVENOUS | Status: DC

## 2019-07-27 MED ORDER — ADULT MULTIVITAMIN W/MINERALS CH: 1.0000 | ORAL_TABLET | Freq: Every day | ORAL | Status: DC

## 2019-07-27 MED ORDER — MAGNESIUM SULFATE 4 GM/100ML IV SOLN
4.0000 g | Freq: Once | INTRAVENOUS | Status: DC
  Filled 2019-07-27: qty 100

## 2019-07-27 MED ORDER — ALBUMIN HUMAN 25 % IV SOLN
50.0000 g | Freq: Once | INTRAVENOUS | Status: DC
  Filled 2019-07-27: qty 200

## 2019-07-27 MED ORDER — MORPHINE SULFATE (PF) 2 MG/ML IV SOLN
2.0000 mg | INTRAVENOUS | Status: DC | PRN
  Administered 2019-07-27: 2 mg via INTRAVENOUS
  Filled 2019-07-27: qty 1

## 2019-07-27 MED ORDER — HEPARIN SODIUM (PORCINE) 5000 UNIT/ML IJ SOLN
INTRAMUSCULAR | Status: AC
  Filled 2019-07-27: qty 1

## 2019-07-27 MED ORDER — APIXABAN 5 MG PO TABS: 5.0000 mg | ORAL_TABLET | Freq: Two times a day (BID) | ORAL | Status: DC

## 2019-07-27 MED ORDER — ALBUMIN HUMAN 25 % IV SOLN
25.0000 g | Freq: Once | INTRAVENOUS | Status: AC
  Administered 2019-07-27: 25 g via INTRAVENOUS
  Filled 2019-07-27: qty 100

## 2019-07-27 MED ORDER — OXYCODONE HCL 5 MG PO TABS: 2.5000 mg | ORAL_TABLET | Freq: Four times a day (QID) | ORAL | 0 refills | Status: AC | PRN

## 2019-07-27 MED ORDER — POTASSIUM CHLORIDE CRYS ER 20 MEQ PO TBCR: 20.0000 meq | EXTENDED_RELEASE_TABLET | Freq: Every day | ORAL | Status: DC

## 2019-07-27 MED ORDER — PANCRELIPASE (LIP-PROT-AMYL) 12000-38000 UNITS PO CPEP
36000.0000 [IU] | ORAL_CAPSULE | Freq: Three times a day (TID) | ORAL | Status: DC
  Administered 2019-07-27: 12000 [IU] via ORAL
  Filled 2019-07-27 (×3): qty 3

## 2019-07-27 MED ORDER — VITAMIN D (ERGOCALCIFEROL) 1.25 MG (50000 UNIT) PO CAPS: 50000.0000 [IU] | ORAL_CAPSULE | ORAL | Status: DC

## 2019-07-27 MED ORDER — RIFAXIMIN 550 MG PO TABS
550.0000 mg | ORAL_TABLET | Freq: Two times a day (BID) | ORAL | Status: DC
  Administered 2019-07-27: 550 mg via ORAL
  Filled 2019-07-27 (×3): qty 1

## 2019-07-27 NOTE — ED Notes (Signed)
Admit provider notified that pts oxygen went up to 5LPM via [place] and that her respirations are elevated

## 2019-07-27 NOTE — Consult Note (Signed)
PHARMACY - PHYSICIAN COMMUNICATION CRITICAL VALUE ALERT - BLOOD CULTURE IDENTIFICATION (BCID)  Abigail [name] is an 59 y.o. female who presented to [facility] on 07/26/2019 with a chief complaint of AMS  Assessment:  1 out 4 bottles growing GPC. BCID positive for staph sp mecA +. Pt is currently on cefepime and vancomycin.  Name of physician (or Provider) Contacted: Dr Chipper Herb  Current antibiotics: cefepime and vancomycin   Changes to prescribed antibiotics recommended: No change Patient is on recommended antibiotics - No changes needed   Per Dr. Chipper Herb pt is comfort care only.     Ronnald Ramp 07/27/2019  12:59 PM

## 2019-07-27 NOTE — ED Notes (Signed)
After talking with admit provider. 134mL/hr of NS for 5 hours to start after the bolus over 2 hours. Please see previous notes

## 2019-07-27 NOTE — Progress Notes (Signed)
Pharmacy Antibiotic Note  Abigail [name] is a 59 y.o. female admitted on 07/26/2019 with pneumonia.  Pharmacy has been consulted for vanc/cefepime dosing.  Plan: Patient received vanc 1g IV load, and cefepime 2g IV x 1 in ED.  Vancomycin 1000 mg IV Q 36 hrs. Goal AUC 400-550. Expected AUC: 513.3 SCr used: 1.06 Cssmin: 10.0  Will continue cefepime 2g IV q12h per CrCl 30 - 60 ml/min and will continue to monitor and adjust doses as needed per renal function.  Height: 5' (152.4 cm) Weight: 45.4 kg (100 lb) IBW/kg (Calculated) : 45.5  Temp (24hrs), Avg:98.4 F (36.9 C), Min:98.4 F (36.9 C), Max:98.4 F (36.9 C)  Recent Labs  Lab 07/26/19 2148 07/26/19 2321  WBC 11.9*  --   CREATININE 1.06*  --   LATICACIDVEN  --  2.4*    Estimated Creatinine Clearance: 42.5 mL/min (A) (by C-G formula based on SCr of 1.06 mg/dL (H)).    Allergies  Allergen Reactions  . Latex Rash    "hands turn beat red and start to itch"  . Atorvastatin Other (See Comments)    Swollen joints  . Dilaudid [Hydromorphone Hcl] Itching and Other (See Comments)    Itching- no rash- likely side effect but not true allergy.  Tolerates oxycodone.   [name] [Escitalopram Oxalate] Other (See Comments)    Psychological changes  . Lisinopril Other (See Comments)    Hives, presumed allergy  . Mirtazapine Swelling    swelling  . Nsaids     Gastritis 2013  . Tylenol [Acetaminophen] Other (See Comments)    Hx pancreatitis    Thank you for allowing pharmacy to be a part of this patient's care.  Thomasene Ripple, PharmD, BCPS Clinical Pharmacist 07/27/2019 12:54 AM

## 2019-07-27 NOTE — Progress Notes (Addendum)
Manufacturing engineer [facility]) Hospital Liaison RN Note  Met with patient at bedside. She reports she is miserable and does not wish to pursue aggressive care. She states she cannot go back home "like this". Offered her the option of transferring to the [facility] for end of life care, which she requests. She stated Frederica Kuster, her boyfriend is her HCPOA. Our records indicate this has not been completed yet, is in process with an attorney's office. I have reached out to our SW Director for guidance.   Have notified Dr. Roosevelt Locks, Dr. Royden Purl and bedside RN Anderson Malta of patient wishes for comfort care only and desire to transfer to [place].   Will follow up with family and Director of SW with [facility] to facilitate transfer as soon as possible.  Please call with any hospice related questions or concerns.  1215 ADDENDUM:  Returned to room. Ernie at bedside. He states he has completed paperwork for HCPOA, he has a copy at home. He is leaving now to get that paperwork and present to the [facility] to complete paperwork for transfer. He is going to reach out to the daughter and update her. Hospice Home will notify me when paperwork completed and we will call EMS for transport.  1320: EMS Notified for transport. Bedside RN Anderson Malta notified.  Thank you. Margaretmary Eddy, BSN, RN [facility] Liaison [phone]

## 2019-07-27 NOTE — ED Notes (Signed)
Pt oxygen increased to 5L [place] due to decreased oxygen saturation. Level increased to 87%. Then increased to 6L [place] and only increased to 89-90%. Dr. Chipper Herb notified at this time.

## 2019-07-27 NOTE — ED Notes (Signed)
Per RT they are out of bipaps at this time and are trying to locate more

## 2019-07-27 NOTE — Progress Notes (Signed)
PROGRESS NOTE    Abigail [name]  KZS:[id number] DOB: September 19, 1960 DOA: 07/26/2019 PCP: Tonia Ghent, MD (Confirm with patient/family/NH records and if not entered, this HAS to be entered at [facility] point of entry. "No PCP" if truly none.)   Brief Narrative: (Start on day 1 of progress note - keep it brief and live) Patient is a 59 year old female with history of alcoholism, alcoholic liver cirrhosis, portal vein thrombosis, essential hypertension, chronic pancreatitis who came to the hospital complaining of altered mental status, hypoxemia and the pain.  Upon arrival in the emergency room, she was placed on 100% nonbreather, she also had a low blood pressure, she was given 500 ml of normal saline bolus.  She also received 25 g albumin.  CT chest with contrast showed bilateral pleural effusion more on the left side, large ascites.  She had a procalcitonin level of 0.59.  She was given cefepime to cover for possible pneumonia.  Currently, patient oxygenation is worse again after initial improvement.  She is DO NOT RESUSCITATE status, she was under hospice care at home prior to admission.  She wished to treat her infection.   Assessment & Plan:   Principal Problem:   Severe sepsis ([place]) Active Problems:   Acute hepatic encephalopathy   Alcoholic cirrhosis of liver without ascites (HCC)   Acute respiratory failure with hypoxia (HCC)   Multifocal pneumonia   Anasarca   History of COVID-19  #1.  Acute hypoxemic respiratory failure. This is multifactorial, he could have a pneumonia, but a CT scan did not show any focal infiltration/consolidation.  She has been large bilateral pleural effusion, she also has evidence of pulmonary hypertension.  Currently, she also has evidence of volume overload.  He is receiving antibiotics as well as diuretics.  2.  Severe sepsis.  Etiology could be due to pneumonia, but also patient has evidence of possible peritonitis.  We obtain paracentesis, send for labs.   Continue cefepime for now.  Currently, patient has borderline blood pressure.  However, she has a chronic hypotension in baseline.  Due to volume overload, no additional fluid will be given.  #3.  Possible multilobar pneumonia. As above.  4.  Acute kidney injury. She has been receiving albumin.  She will be also receiving albumin after paracentesis.  5.  Hepatic encephalopathy. Continue lactulose and rifaximin.  6.  Anasarca with volume overload. She is currently receiving a dose of IV Lasix.  Due to borderline blood pressure, will continue to evaluate if patient need more doses.  #7.  Alcoholic cirrhosis with ascites. Paracentesis today.  8.  Portal vein thrombosis. Continue anticoagulation.  9.  Severe hypoalbuminemia.  This is secondary to end-stage liver cirrhosis.  She will receive albumin infusion.    DVT prophylaxis: Anticoagulation Code Status: DNR Family Communication: Plan discussed with patient, all questions answered. Disposition Plan:  . Patient came from:Home with hospice            . Anticipated d/c place: Home with hospice . Barriers to d/c OR conditions which need to be met to effect a safe d/c:   Consultants:   None  Procedures: Paracentesis Antimicrobials: Cefepime  Subjective: Patient had a worsening hypoxemia after the initial improvement.  She still has significant short of breath with min exertion.  She still has some confusion. She is also complaining some abdominal pain, which appears to be chronic in nature.  No nausea vomiting.  No diarrhea.  Objective: Vitals:   07/27/19 7322 07/27/19 0254 07/27/19 0732  07/27/19 0733  BP:      Pulse: (!) 107 (!) 106 (!) 106 (!) 106  Resp: (!) 37 (!) 34 (!) 31 (!) 33  Temp:      TempSrc:      SpO2: (!) 87% 91% (!) 89% 91%  Weight:      Height:       No intake or output data in the 24 hours ending 07/27/19 0814 Filed Weights   07/26/19 2030  Weight: 45.4 kg    Examination:  General exam:  Appears calm and comfortable  Respiratory system: Fine crackles in the base to auscultation. Respiratory effort normal. Cardiovascular system: S1 & S2 heard, RRR. No JVD, murmurs, rubs, gallops or clicks. No pedal edema. Gastrointestinal system: Abdomen is distended, soft and diffusely tender.  Some rebound tenderness.  No organomegaly or masses felt. Normal bowel sounds heard. Central nervous system: Alert and oriented x2. No focal neurological deficits. Extremities: Symmetric Skin: No rashes, lesions or ulcers Psychiatry: Judgement and insight appear normal. Mood & affect appropriate.     Data Reviewed: I have personally reviewed following labs and imaging studies  CBC: Recent Labs  Lab 07/26/19 2148  WBC 11.9*  NEUTROABS 7.6  HGB 10.5*  HCT 32.0*  MCV 95.0  PLT 696   Basic Metabolic Panel: Recent Labs  Lab 07/26/19 2148 07/26/19 2321  NA 125*  --   K 4.2  --   CL 89*  --   CO2 28  --   GLUCOSE 82  --   BUN 12  --   CREATININE 1.06*  --   CALCIUM 7.5*  --   MG  --  1.5*   GFR: Estimated Creatinine Clearance: 42.5 mL/min (A) (by C-G formula based on SCr of 1.06 mg/dL (H)). Liver Function Tests: Recent Labs  Lab 07/26/19 2148  AST 59*  ALT 26  ALKPHOS 105  BILITOT 2.7*  PROT 5.7*  ALBUMIN 1.4*   No results for input(s): LIPASE, AMYLASE in the last 168 hours. Recent Labs  Lab 07/26/19 2321  AMMONIA 53*   Coagulation Profile: Recent Labs  Lab 07/26/19 2323 07/27/19 0150  INR 3.3* 3.3*   Cardiac Enzymes: No results for input(s): CKTOTAL, CKMB, CKMBINDEX, TROPONINI in the last 168 hours. BNP (last 3 results) Recent Labs    05/12/19 0935  PROBNP 203.0*   HbA1C: No results for input(s): HGBA1C in the last 72 hours. CBG: No results for input(s): GLUCAP in the last 168 hours. Lipid Profile: No results for input(s): CHOL, HDL, LDLCALC, TRIG, CHOLHDL, LDLDIRECT in the last 72 hours. Thyroid Function Tests: No results for input(s): TSH, T4TOTAL,  FREET4, T3FREE, THYROIDAB in the last 72 hours. Anemia Panel: No results for input(s): VITAMINB12, FOLATE, FERRITIN, TIBC, IRON, RETICCTPCT in the last 72 hours. Sepsis Labs: Recent Labs  Lab 07/26/19 2148 07/26/19 2321 07/27/19 0135 07/27/19 0150  PROCALCITON 0.59  --   --  0.44  LATICACIDVEN  --  2.4* 1.8  --     Recent Results (from the past 240 hour(s))  Blood culture (routine x 2)     Status: None (Preliminary result)   Collection Time: 07/26/19 11:05 PM   Specimen: BLOOD  Result Value Ref Range Status   Specimen Description BLOOD BLOOD RIGHT HAND  Final   Special Requests   Final    BOTTLES DRAWN AEROBIC ONLY Blood Culture adequate volume   Culture   Final    NO GROWTH < 12 HOURS Performed at [facility], [address] [place], [place] [address]    Report Status PENDING  Incomplete  Blood culture (routine x 2)     Status: None (Preliminary result)   Collection Time: 07/26/19 11:21 PM   Specimen: BLOOD  Result Value Ref Range Status   Specimen Description BLOOD LEFT ANTECUBITAL  Final   Special Requests   Final    BOTTLES DRAWN AEROBIC ONLY Blood Culture adequate volume   Culture   Final    NO GROWTH < 12 HOURS Performed at [facility], [address] [place], [place] [address]    Report Status PENDING  Incomplete  Respiratory Panel by RT PCR (Flu A&B, Covid) - Nasopharyngeal Swab     Status: None   Collection Time: 07/26/19 11:21 PM   Specimen: Nasopharyngeal Swab  Result Value Ref Range Status   SARS Coronavirus 2 by RT PCR NEGATIVE NEGATIVE Final    Comment: (NOTE) SARS-CoV-2 target nucleic acids are NOT DETECTED. The SARS-CoV-2 RNA is generally detectable in upper respiratoy specimens during the acute phase of infection. The lowest concentration of SARS-CoV-2 viral copies this assay can detect is 131 copies/mL. A negative result does not preclude SARS-Cov-2 infection and should not be used as the sole basis for treatment or other  patient management decisions. A negative result may occur with  improper specimen collection/handling, submission of specimen other than nasopharyngeal swab, presence of viral mutation(s) within the areas targeted by this assay, and inadequate number of viral copies (<131 copies/mL). A negative result must be combined with clinical observations, patient history, and epidemiological information. The expected result is Negative. Fact Sheet for Patients:  PinkCheek.be Fact Sheet for Healthcare Providers:  GravelBags.it This test is not yet ap proved or cleared by the [place] FDA and  has been authorized for detection and/or diagnosis of SARS-CoV-2 by FDA under an Emergency Use Authorization (EUA). This EUA will remain  in effect (meaning this test can be used) for the duration of the COVID-19 declaration under Section 564(b)(1) of the Act, 21 U.S.C. section 360bbb-3(b)(1), unless the authorization is terminated or revoked sooner.    Influenza A by PCR NEGATIVE NEGATIVE Final   Influenza B by PCR NEGATIVE NEGATIVE Final    Comment: (NOTE) The Xpert Xpress SARS-CoV-2/FLU/RSV assay is intended as an aid in  the diagnosis of influenza from Nasopharyngeal swab specimens and  should not be used as a sole basis for treatment. Nasal washings and  aspirates are unacceptable for Xpert Xpress SARS-CoV-2/FLU/RSV  testing. Fact Sheet for Patients: PinkCheek.be Fact Sheet for Healthcare Providers: GravelBags.it This test is not yet approved or cleared by the [place] FDA and  has been authorized for detection and/or diagnosis of SARS-CoV-2 by  FDA under an Emergency Use Authorization (EUA). This EUA will remain  in effect (meaning this test can be used) for the duration of the  Covid-19 declaration under Section 564(b)(1) of the Act, 21  U.S.C. section 360bbb-3(b)(1), unless  the authorization is  terminated or revoked. Performed at [facility], [address], [place], [place] [address]          Radiology Studies: CT ANGIO CHEST PE W OR WO CONTRAST  Result Date: 07/27/2019 CLINICAL DATA:  Acute respiratory failure with hypoxia and increasing oxygen needs. EXAM: CT ANGIOGRAPHY CHEST WITH CONTRAST TECHNIQUE: Multidetector CT imaging of the chest was performed using the standard protocol during bolus administration of intravenous contrast. Multiplanar CT image reconstructions and MIPs were obtained to evaluate the vascular anatomy. CONTRAST:  34m OMNIPAQUE IOHEXOL 350 MG/ML SOLN COMPARISON:  07/13/2016 FINDINGS: Cardiovascular: The heart is normal in size. No pericardial effusion. Mild tortuosity of the thoracic aorta but no aneurysm or dissection. No atherosclerotic calcifications. The branch vessels are patent. A few scattered coronary artery calcifications are suspected. Enlarged pulmonary artery suggesting pulmonary hypertension. No pulmonary artery filling defects to suggest pulmonary embolism. Mediastinum/Nodes: No mediastinal or hilar mass or adenopathy. Lungs/Pleura: Moderate-sized right pleural effusion and large left pleural effusion with overlying atelectasis. Diffuse but patchy and somewhat asymmetric interstitial and airspace process in the lungs, likely pulmonary edema. Atypical/viral pneumonia is also possible but with the large pleural effusions pulmonary edema is much more likely. Upper Abdomen: Severe geographic fatty infiltration of the liver is noted. Evidence of gastric bypass surgery. Large volume abdominal ascites. Musculoskeletal: No chest wall mass or breast masses are identified. There is diffuse body wall edema suggesting anasarca. The bony thorax is intact. Review of the MIP images confirms the above findings. IMPRESSION: 1. Mild tortuosity of the thoracic aorta but no aneurysm or dissection. 2. Enlarged pulmonary artery suggesting  pulmonary hypertension. No findings for pulmonary embolism. 3. Moderate-sized right pleural effusion and large left pleural effusion with overlying atelectasis. 4. Diffuse but patchy and somewhat asymmetric interstitial and airspace process in the lungs, likely pulmonary edema. Atypical/viral pneumonia is also possible but with the large pleural effusions pulmonary edema is much more likely. 5. Large volume abdominal ascites and diffuse body wall edema suggesting anasarca. 6. Severe geographic fatty infiltration of the liver. 7. Aortic atherosclerosis. Aortic Atherosclerosis (ICD10-I70.0). Aortic Atherosclerosis (ICD10-I70.0). Electronically Signed   By: PMarijo SanesM.D.   On: 07/27/2019 07:31   DG Chest Portable 1 View  Result Date: 07/26/2019 CLINICAL DATA:  59year old female with shortness of breath. EXAM: PORTABLE CHEST 1 VIEW COMPARISON:  Chest radiograph dated 05/12/2019. FINDINGS: Shallow inspiration. Diffuse bilateral interstitial and airspace confluent and streaky densities may represent multifocal pneumonia versus edema. Clinical correlation is recommended. There is no pleural effusion or pneumothorax. The cardiac silhouette is within normal limits. No acute osseous pathology. IMPRESSION: Multifocal pneumonia versus edema. Clinical correlation and follow-up recommended. Electronically Signed   By: AAnner CreteM.D.   On: 07/26/2019 21:14        Scheduled Meds: . apixaban  5 mg Oral BID  . feeding supplement (ENSURE ENLIVE)  237 mL Oral TID BM  . folic acid  1 mg Oral Daily  . lactulose  30 g Oral TID  . lipase/protease/amylase  36,000 Units Oral TID AC  . midodrine  5 mg Oral TID WC  . multivitamin with minerals  1 tablet Oral Daily  . potassium chloride SA  20 mEq Oral Daily  . rifaximin  550 mg Oral BID  . thiamine  100 mg Oral Daily  . [START ON 08/03/2019] Vitamin D (Ergocalciferol)  50,000 Units Oral Q7 days   Continuous Infusions: . albumin human    . ceFEPime  (MAXIPIME) IV    . magnesium sulfate bolus IVPB    . [START ON 404/29/2021 vancomycin       LOS: 1 day    Time spent: 427min    DSharen Hones MD [facility]   To contact the attending provider between 7A-7P or the covering provider during after hours 7P-7A, please log into the web site [email or web address] and access using universal [place] password for that web site. If you do not have the password, please call the hospital operator.  07/27/2019, 8:14 AM

## 2019-07-27 NOTE — ED Notes (Signed)
Provider canceled order for IVF at 128mL/hr and placed order for lasix. Provider notified BP was still 97/62 and verifying order. Waiting on providers response

## 2019-07-27 NOTE — Progress Notes (Addendum)
-  Acute respiratory failure with hypoxia with increasing oxygen needs and work of breathing reported by nursing.  Patient started on BiPAP.  Chest x-ray with multifocal pneumonia versus edema.  Given normal white count and history of anasarca secondary to liver failure with severe heart failure and EF of 20%, likelihood is it is fluid.  She has been started on albumin followed by Lasix secondary to borderline hypotension.  She has history of hypotension likely secondary to liver failure and heart failure.  She is on midodrine 3 times a day to manage it.  Also given her VTE history and questionable compliance with medications CTA of chest PE study has been ordered.  This will also help clarify infectious versus fluid process in the lungs.   Minor elevation in lactic acid initiallly likely metabolic secondary to liver failure and acute kidney injury likely secondary to intravascular dehydration with low albumin. Procalcitonin elevation could be elevated secondary to clotting  Factor V Leiden disease and history of portal vein thrombosis with recent DVTs - -Ultrasound duplex of lower extremitieson 3/5 showedbilateral DVTin theRight popliteal DVT and left common femoral and left femoral vein DVT. Subcu heparin discontinued and oral Eliquis resumed.  Metabolic encephalopathy with elevated ammonia level.  Lactulose already ordered 3 times a day.  Rifaximin started. Patient with chronic hyponatremia and fluid overload causing the picture of acute on chronic hyponatremia.  Likely will improve with diuresis and resumption of oral intake.  Patient should be started on Lasix and spironolactone daily.  In addition she is on SSRI Effexor that could be contributing already to hyponatremia.  This has been currently held and should be evaluated for need and possibly changed to alternative.

## 2019-07-27 NOTE — ED Notes (Signed)
Hand off of care and report given to noah, RN

## 2019-07-27 NOTE — Discharge Summary (Signed)
Physician Discharge Summary  Patient ID: Abigail [name] MRN: [id number] DOB/AGE: 1961/03/03 59 y.o.  Admit date: 07/26/2019 Discharge date: 07/27/2019  Admission Diagnoses:  Discharge Diagnoses:  Principal Problem:   Severe sepsis [facility]) Active Problems:   Acute hepatic encephalopathy   Alcoholic cirrhosis of liver without ascites (HCC)   Acute respiratory failure with hypoxia (HCC)   Multifocal pneumonia   Anasarca   History of COVID-19   Discharged Condition: Terminal, comfort care only  Hospital Course:  Patient is a 59 year old female with history of alcoholism, alcoholic liver cirrhosis, portal vein thrombosis, essential hypertension, chronic pancreatitis who came to the hospital complaining of altered mental status, hypoxemia and the pain.  Upon arrival in the emergency room, she was placed on 100% nonbreather, she also had a low blood pressure, she was given 500 ml of normal saline bolus.  She also received 25 g albumin.  CT chest with contrast showed bilateral pleural effusion more on the left side, large ascites.  She had a procalcitonin level of 0.59.  She was given cefepime to cover for possible pneumonia.  Currently, patient oxygenation is worse again after initial improvement.  She is DO NOT RESUSCITATE status, she was under hospice care at home prior to admission.    Patient is seen by [facility] care in the hospital, after discussion with her POA and the patient herself, decision was made to pursue comfort care only.  Patient has been accepted to hospice.  We will discontinue all active treatment, she will be transferred today.     Consults: Perative care.  Significant Diagnostic Studies: {  Treatments: Antibiotics.  Discharge Exam: Blood pressure 95/69, pulse (!) 103, temperature 98.4 F (36.9 C), temperature source Oral, resp. rate (!) 34, height 5' (1.524 m), weight 45.4 kg, SpO2 92 %. General appearance: alert and pale Resp: Crackles in the base Cardio:  Regular, no murmurs. GI: Distended with large ascites, tenderness, diffuse rebound tenderness. Extremities: 2+ edema, also has anasarca.  Disposition: Discharge disposition: 03-Skilled Nursing Facility       Discharge Instructions    Diet - low sodium heart healthy   Complete by: As directed    Increase activity slowly   Complete by: As directed      Allergies as of 07/27/2019      Reactions   Latex Rash   "hands turn beat red and start to itch"   Atorvastatin Other (See Comments)   Swollen joints   Dilaudid [hydromorphone Hcl] Itching, Other (See Comments)   Itching- no rash- likely side effect but not true allergy.  Tolerates oxycodone.    Lexapro [escitalopram Oxalate] Other (See Comments)   Psychological changes   Lisinopril Other (See Comments)   Hives, presumed allergy   Mirtazapine Swelling   swelling   Nsaids    Gastritis 2013   Tylenol [acetaminophen] Other (See Comments)   Hx pancreatitis      Medication List    STOP taking these medications   apixaban 5 MG Tabs tablet Commonly known as: ELIQUIS   B-D INSULIN SYRINGE 1CC/25GX1" 25G X 1" 1 ML Misc Generic drug: Insulin Syringe-Needle U-100   calcium-vitamin D 500-200 MG-UNIT tablet Commonly known as: OSCAL WITH D   cephALEXin 500 MG capsule Commonly known as: KEFLEX   Creon 36000 UNITS Cpep capsule Generic drug: lipase/protease/amylase   feeding supplement (ENSURE ENLIVE) Liqd   folic acid 1 MG tablet Commonly known as: FOLVITE   furosemide 80 MG tablet Commonly known as: LASIX   gabapentin 100  MG capsule Commonly known as: NEURONTIN   lactulose 10 GM/15ML solution Commonly known as: CHRONULAC   midodrine 5 MG tablet Commonly known as: PROAMATINE   multivitamin with minerals Tabs tablet   ondansetron 4 MG disintegrating tablet Commonly known as: Zofran ODT   pantoprazole 40 MG tablet Commonly known as: PROTONIX   potassium chloride SA 20 MEQ tablet Commonly known as: KLOR-CON    thiamine 100 MG tablet   venlafaxine 75 MG tablet Commonly known as: EFFEXOR   Vitamin D (Ergocalciferol) 1.25 MG (50000 UNIT) Caps capsule Commonly known as: DRISDOL     TAKE these medications   albuterol 108 (90 Base) MCG/ACT inhaler Commonly known as: VENTOLIN HFA Inhale 2 puffs into the lungs every 4 (four) hours as needed for wheezing or shortness of breath.   oxyCODONE 5 MG immediate release tablet Commonly known as: Roxicodone Take 0.5-1 tablets (2.5-5 mg total) by mouth every 6 (six) hours as needed for breakthrough pain (hospice patient.).   traMADol 50 MG tablet Commonly known as: ULTRAM TAKE ONE TABLET BY MOUTH EVERY 8 HOUR AS NEEDED FOR PAIN What changed: See the new instructions.        Signed: Marrion Coy 07/27/2019, 12:59 PM

## 2019-07-29 LAB — CULTURE, BLOOD (ROUTINE X 2): Special Requests: ADEQUATE

## 2019-07-31 LAB — CULTURE, BLOOD (ROUTINE X 2)
Culture: NO GROWTH
Special Requests: ADEQUATE

## 2019-08-04 LAB — VITAMIN K1, SERUM: VITAMIN K1: 0.34 nmol/L (ref 0.22–4.88)

## 2019-08-05 DEATH — deceased

## 2019-08-08 ENCOUNTER — Ambulatory Visit: Payer: 59 | Admitting: Gastroenterology

## 2019-08-08 ENCOUNTER — Other Ambulatory Visit: Payer: Self-pay | Admitting: Family Medicine

## 2020-04-18 IMAGING — CT CT ABD-PELV W/ CM
2 of 5 series · 15 of 46 positions shown, 17 images · IV contrast (APPLIED)
Comparison: CT scan May 23, 2017

CLINICAL DATA: Nausea for 1 week with upper abdominal pain. History
of cholecystectomy, gastric bypass, and appendectomy. Known clot in
liver. Patient on Eliquis.

EXAM:
CT ABDOMEN AND PELVIS WITH CONTRAST
TECHNIQUE: Multidetector CT imaging of the abdomen and pelvis was performed
using the standard protocol following bolus administration of
intravenous contrast.
CONTRAST:  100mL OMNIPAQUE IOHEXOL 300 MG/ML  SOLN

[Series 2: routine abd/pel with · axial · 0.85mm/px · z∈[-459,-4]mm · 12 of 103 slices shown, 14 images]
[im 6/103  soft-tissue]
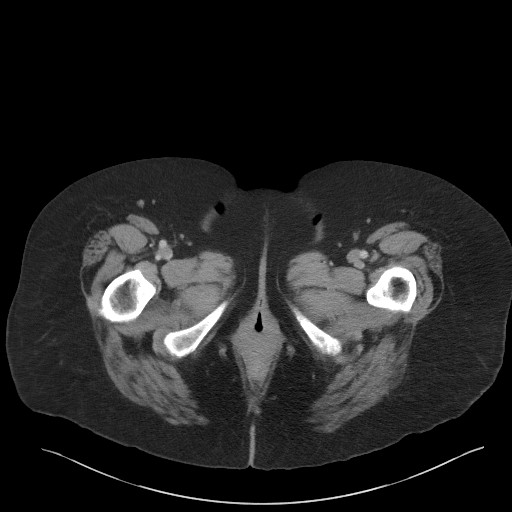
[im 6/103  bone]
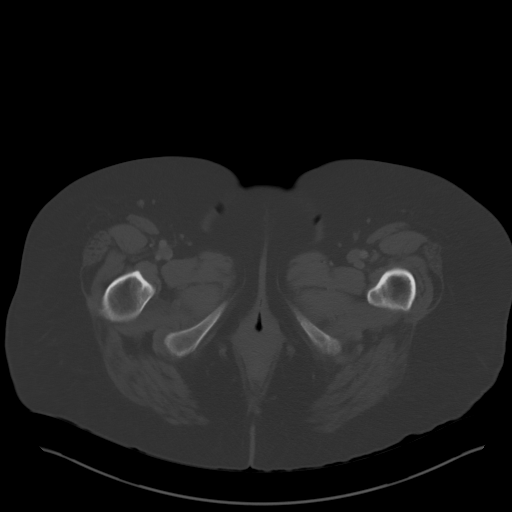
[im 18/103  soft-tissue]
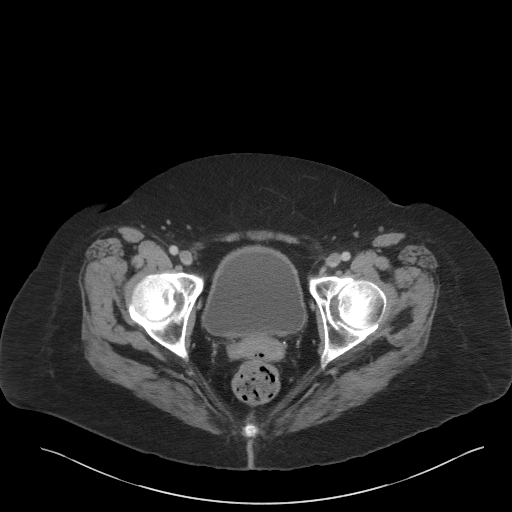
[im 23/103  soft-tissue]
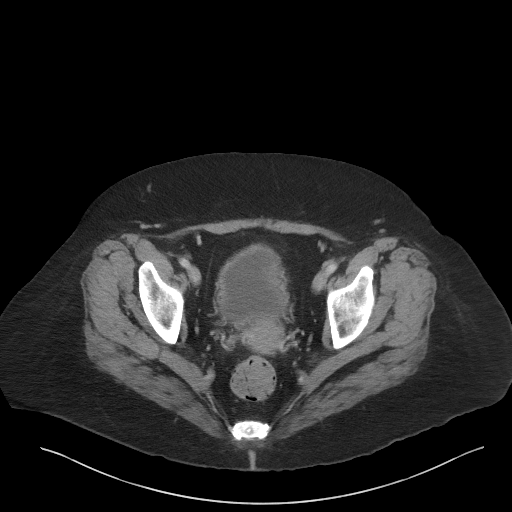
[im 29/103  soft-tissue]
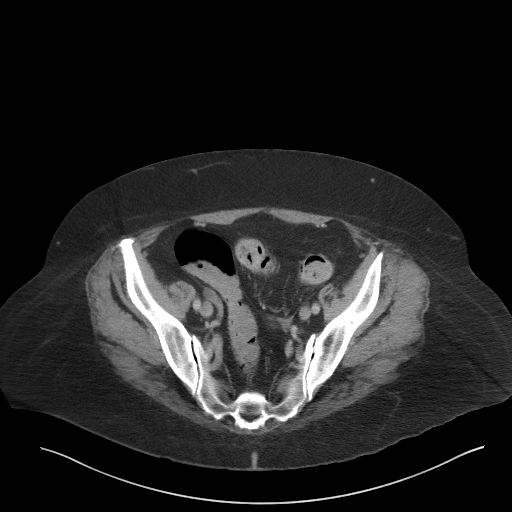
[im 40/103  soft-tissue]
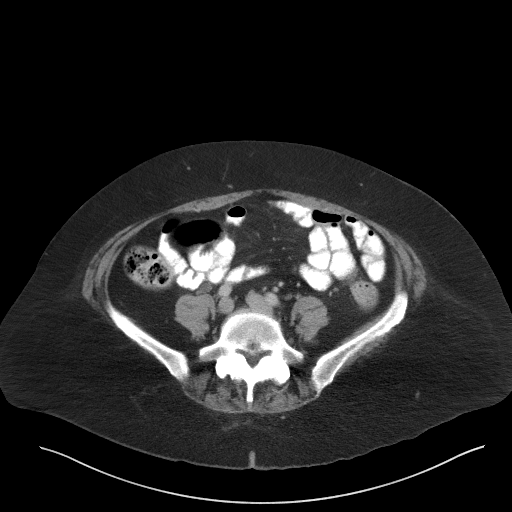
[im 46/103  soft-tissue]
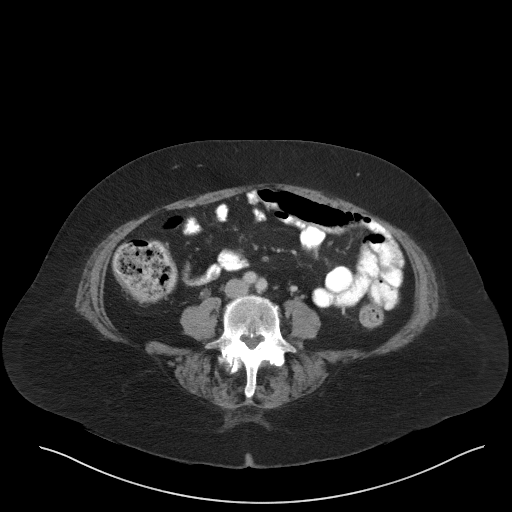
[im 57/103  soft-tissue]
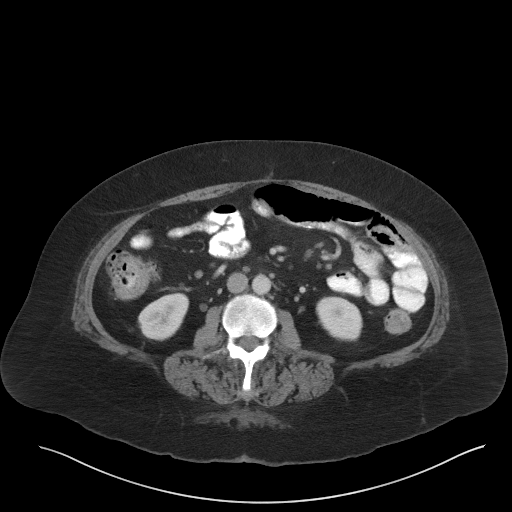
[im 63/103  soft-tissue]
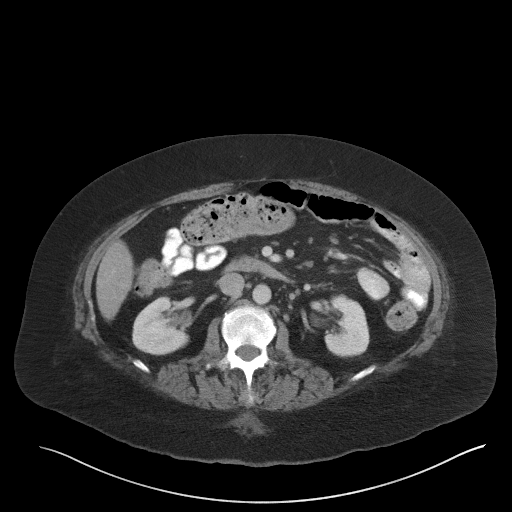
[im 74/103  soft-tissue]
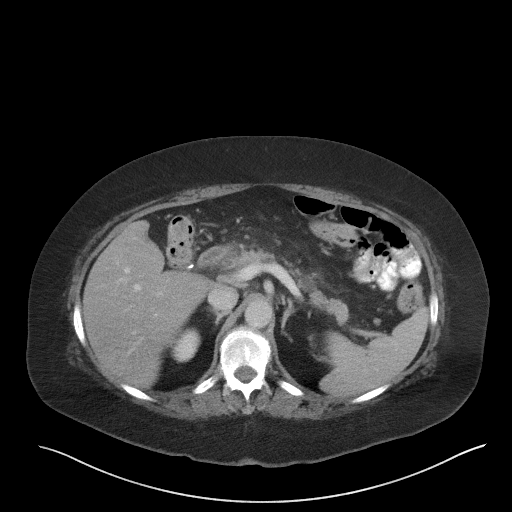
[im 74/103  bone]
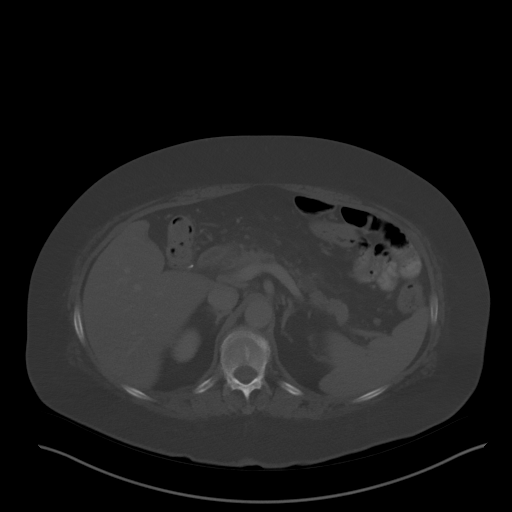
[im 80/103  soft-tissue]
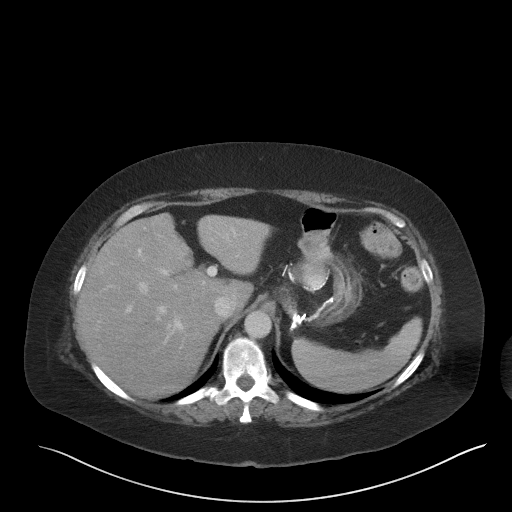
[im 86/103  soft-tissue]
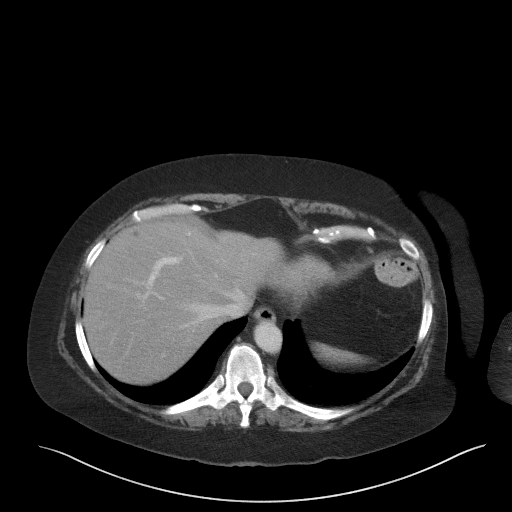
[im 97/103  soft-tissue]
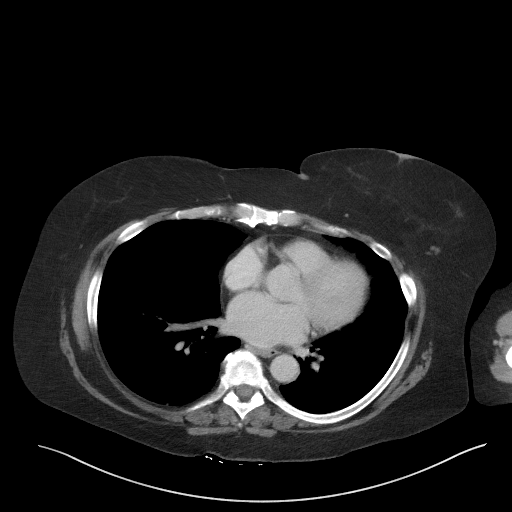

[Series 5: coronal st · coronal · 0.75mm/px · 3 of 81 slices shown]
[im 27/81  soft-tissue]
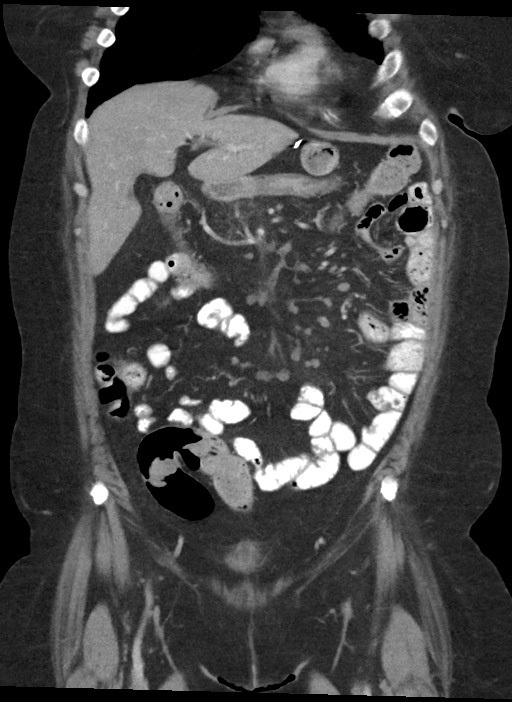
[im 36/81  soft-tissue]
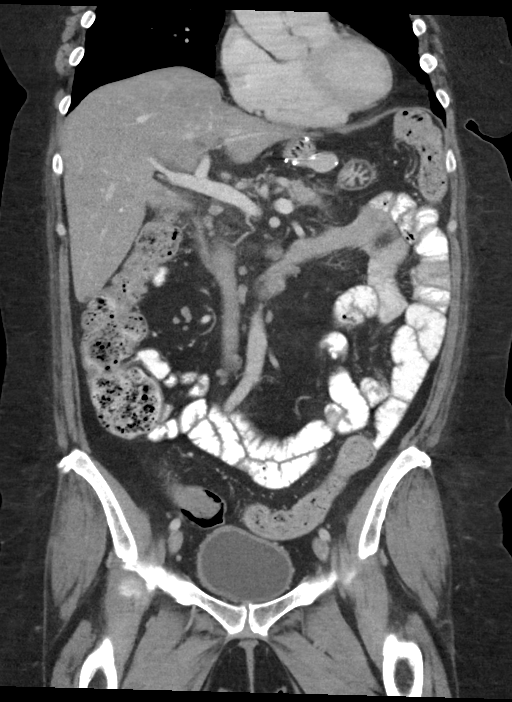
[im 45/81  soft-tissue]
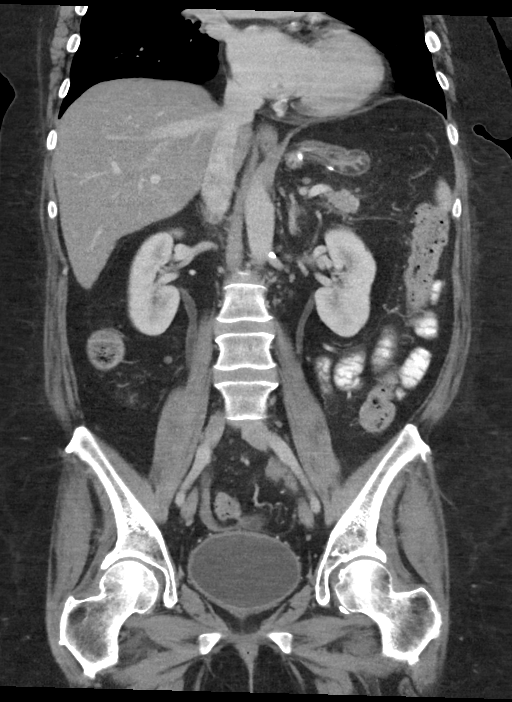

[15 of 46 positions shown; findings below may reference images not displayed]

FINDINGS: Lower chest: Mild opacity posteriorly in the medial right lung base
on series 4, image 13 is a new finding. This could represent
atelectasis or subtle infiltrate. Recommend clinical correlation.
Mild atelectasis in the left base. Lung bases otherwise normal. No
other abnormalities in the lower chest.

Hepatobiliary: Hepatic steatosis. A tiny low-attenuation lesion in
the hepatic dome on series 2, image 16 is stable. A few other tiny
hepatic masses are likely small cysts. No suspicious masses are
noted. Previous cholecystectomy. The portal vein is patent. The
nonocclusive thrombus at the junction of the portal vein and splenic
vein on the May 14, 2017 ultrasound is not visualized today.

Pancreas: There is mild fat stranding surrounding the pancreas.
There is a small low-attenuation mass in the pancreatic tail
measuring 10 mm with an attenuation of between 26 and 40 Hounsfield
units, new since Monday October, 2015 and not definitively seen in Sunday May, 2017. No other pancreatic masses noted.

Spleen: Spleen is normal.

Adrenals/Urinary Tract: Adrenal glands are normal. Kidneys are
unremarkable. The right ureter is mildly prominent but there are no
stones seen along the course of either ureter. No hydronephrosis or
perinephric stranding.

Stomach/Bowel: Previous gastric bypass. Remainder of the small bowel
is normal. The colon is normal. Previous appendectomy.

Vascular/Lymphatic: Atherosclerotic changes are seen in the
nonaneurysmal aorta no adenopathy.

Reproductive: Uterus and bilateral adnexa are unremarkable.

Other: No free air or free fluid.

Musculoskeletal: No acute or significant osseous findings.
IMPRESSION: 1. Fat stranding around the pancreas, consistent with pancreatitis.
2. There is a 10 mm low-attenuation mass in the pancreatic body not
definitely seen on previous studies. I suspect this is probably a
mildly complicated cystic mass and is likely sequela of previous
pancreatitis. The finding is non-specific however. Consider an MRI
for further assessment as an outpatient to confirm the cystic
nature.
3. Mild opacity posteriorly in the right lung base could represent
atelectasis or subtle developing infiltrate. Recommend clinical
correlation and attention on follow-up.
4. Portal vein is patent.
5. Atherosclerosis in the aorta.
